# Patient Record
Sex: Female | Born: 1975 | Race: Black or African American | Hispanic: No | Marital: Single | Smoking: Former smoker
Health system: Southern US, Community
[De-identification: ages and names within clinical notes are randomized; demographics above are authoritative.]

## PROBLEM LIST (undated history)

## (undated) DIAGNOSIS — M7989 Other specified soft tissue disorders: Secondary | ICD-10-CM

## (undated) DIAGNOSIS — R519 Headache, unspecified: Secondary | ICD-10-CM

## (undated) DIAGNOSIS — Z8719 Personal history of other diseases of the digestive system: Secondary | ICD-10-CM

## (undated) DIAGNOSIS — I10 Essential (primary) hypertension: Secondary | ICD-10-CM

## (undated) DIAGNOSIS — K219 Gastro-esophageal reflux disease without esophagitis: Secondary | ICD-10-CM

## (undated) DIAGNOSIS — E039 Hypothyroidism, unspecified: Secondary | ICD-10-CM

## (undated) DIAGNOSIS — M549 Dorsalgia, unspecified: Secondary | ICD-10-CM

## (undated) DIAGNOSIS — R0602 Shortness of breath: Secondary | ICD-10-CM

## (undated) DIAGNOSIS — M2550 Pain in unspecified joint: Secondary | ICD-10-CM

## (undated) DIAGNOSIS — K5900 Constipation, unspecified: Secondary | ICD-10-CM

## (undated) DIAGNOSIS — T7840XA Allergy, unspecified, initial encounter: Secondary | ICD-10-CM

## (undated) DIAGNOSIS — E119 Type 2 diabetes mellitus without complications: Secondary | ICD-10-CM

## (undated) DIAGNOSIS — F988 Other specified behavioral and emotional disorders with onset usually occurring in childhood and adolescence: Secondary | ICD-10-CM

## (undated) DIAGNOSIS — R7303 Prediabetes: Secondary | ICD-10-CM

## (undated) DIAGNOSIS — F909 Attention-deficit hyperactivity disorder, unspecified type: Secondary | ICD-10-CM

## (undated) DIAGNOSIS — R079 Chest pain, unspecified: Secondary | ICD-10-CM

## (undated) DIAGNOSIS — D219 Benign neoplasm of connective and other soft tissue, unspecified: Secondary | ICD-10-CM

## (undated) DIAGNOSIS — G8929 Other chronic pain: Secondary | ICD-10-CM

## (undated) HISTORY — PX: SHOULDER SURGERY: SHX246

## (undated) HISTORY — DX: Allergy, unspecified, initial encounter: T78.40XA

## (undated) HISTORY — DX: Essential (primary) hypertension: I10

## (undated) HISTORY — PX: TUBAL LIGATION: SHX77

## (undated) HISTORY — DX: Shortness of breath: R06.02

## (undated) HISTORY — PX: CARPAL TUNNEL RELEASE: SHX101

## (undated) HISTORY — DX: Constipation, unspecified: K59.00

## (undated) HISTORY — DX: Benign neoplasm of connective and other soft tissue, unspecified: D21.9

## (undated) HISTORY — PX: ABDOMINAL HYSTERECTOMY: SHX81

## (undated) HISTORY — DX: Headache, unspecified: R51.9

## (undated) HISTORY — DX: Other chronic pain: G89.29

## (undated) HISTORY — DX: Hypothyroidism, unspecified: E03.9

## (undated) HISTORY — DX: Chest pain, unspecified: R07.9

## (undated) HISTORY — DX: Attention-deficit hyperactivity disorder, unspecified type: F90.9

## (undated) HISTORY — DX: Other specified soft tissue disorders: M79.89

## (undated) HISTORY — DX: Type 2 diabetes mellitus without complications: E11.9

## (undated) HISTORY — DX: Other specified behavioral and emotional disorders with onset usually occurring in childhood and adolescence: F98.8

## (undated) HISTORY — DX: Pain in unspecified joint: M25.50

---

## 1998-01-24 ENCOUNTER — Emergency Department (HOSPITAL_COMMUNITY): Admission: EM | Admit: 1998-01-24 | Discharge: 1998-01-24 | Payer: Self-pay | Admitting: Emergency Medicine

## 1998-01-24 ENCOUNTER — Inpatient Hospital Stay (HOSPITAL_COMMUNITY): Admission: EM | Admit: 1998-01-24 | Discharge: 1998-01-27 | Payer: Self-pay | Admitting: Psychiatry

## 1998-04-20 ENCOUNTER — Other Ambulatory Visit: Admission: RE | Admit: 1998-04-20 | Discharge: 1998-04-20 | Payer: Self-pay | Admitting: Obstetrics

## 1998-04-30 ENCOUNTER — Inpatient Hospital Stay (HOSPITAL_COMMUNITY): Admission: AD | Admit: 1998-04-30 | Discharge: 1998-04-30 | Payer: Self-pay | Admitting: Obstetrics

## 1998-05-10 ENCOUNTER — Inpatient Hospital Stay (HOSPITAL_COMMUNITY): Admission: AD | Admit: 1998-05-10 | Discharge: 1998-05-10 | Payer: Self-pay | Admitting: Obstetrics

## 1998-05-21 ENCOUNTER — Inpatient Hospital Stay (HOSPITAL_COMMUNITY): Admission: AD | Admit: 1998-05-21 | Discharge: 1998-05-21 | Payer: Self-pay | Admitting: Obstetrics

## 1998-05-28 ENCOUNTER — Inpatient Hospital Stay (HOSPITAL_COMMUNITY): Admission: AD | Admit: 1998-05-28 | Discharge: 1998-05-30 | Payer: Self-pay | Admitting: Obstetrics

## 1998-10-05 ENCOUNTER — Other Ambulatory Visit: Admission: RE | Admit: 1998-10-05 | Discharge: 1998-10-05 | Payer: Self-pay | Admitting: Obstetrics

## 1999-11-24 ENCOUNTER — Emergency Department (HOSPITAL_COMMUNITY): Admission: EM | Admit: 1999-11-24 | Discharge: 1999-11-24 | Payer: Self-pay | Admitting: Emergency Medicine

## 2000-05-04 ENCOUNTER — Other Ambulatory Visit: Admission: RE | Admit: 2000-05-04 | Discharge: 2000-05-04 | Payer: Self-pay | Admitting: Obstetrics

## 2001-02-16 ENCOUNTER — Inpatient Hospital Stay (HOSPITAL_COMMUNITY): Admission: AD | Admit: 2001-02-16 | Discharge: 2001-02-19 | Payer: Self-pay | Admitting: Obstetrics

## 2001-02-17 ENCOUNTER — Encounter: Payer: Self-pay | Admitting: *Deleted

## 2001-05-07 ENCOUNTER — Encounter: Payer: Self-pay | Admitting: Emergency Medicine

## 2001-05-07 ENCOUNTER — Emergency Department (HOSPITAL_COMMUNITY): Admission: EM | Admit: 2001-05-07 | Discharge: 2001-05-07 | Payer: Self-pay | Admitting: Emergency Medicine

## 2003-03-11 ENCOUNTER — Other Ambulatory Visit: Admission: RE | Admit: 2003-03-11 | Discharge: 2003-03-11 | Payer: Self-pay | Admitting: Family Medicine

## 2003-09-04 ENCOUNTER — Encounter: Admission: RE | Admit: 2003-09-04 | Discharge: 2003-12-03 | Payer: Self-pay | Admitting: Family Medicine

## 2003-11-21 ENCOUNTER — Encounter: Admission: RE | Admit: 2003-11-21 | Discharge: 2004-02-19 | Payer: Self-pay | Admitting: Family Medicine

## 2004-03-27 ENCOUNTER — Emergency Department (HOSPITAL_COMMUNITY): Admission: EM | Admit: 2004-03-27 | Discharge: 2004-03-28 | Payer: Self-pay | Admitting: Emergency Medicine

## 2004-05-28 ENCOUNTER — Emergency Department (HOSPITAL_COMMUNITY): Admission: EM | Admit: 2004-05-28 | Discharge: 2004-05-28 | Payer: Self-pay | Admitting: Emergency Medicine

## 2004-07-08 ENCOUNTER — Other Ambulatory Visit: Admission: RE | Admit: 2004-07-08 | Discharge: 2004-07-08 | Payer: Self-pay | Admitting: Family Medicine

## 2005-12-13 ENCOUNTER — Encounter: Admission: RE | Admit: 2005-12-13 | Discharge: 2006-03-13 | Payer: Self-pay | Admitting: Family Medicine

## 2005-12-29 ENCOUNTER — Encounter: Admission: RE | Admit: 2005-12-29 | Discharge: 2005-12-29 | Payer: Self-pay | Admitting: Cardiology

## 2006-02-08 ENCOUNTER — Encounter: Admission: RE | Admit: 2006-02-08 | Discharge: 2006-02-08 | Payer: Self-pay | Admitting: Cardiology

## 2006-02-11 ENCOUNTER — Encounter: Admission: RE | Admit: 2006-02-11 | Discharge: 2006-02-11 | Payer: Self-pay | Admitting: Cardiology

## 2006-03-03 ENCOUNTER — Ambulatory Visit (HOSPITAL_BASED_OUTPATIENT_CLINIC_OR_DEPARTMENT_OTHER): Admission: RE | Admit: 2006-03-03 | Discharge: 2006-03-03 | Payer: Self-pay | Admitting: Cardiology

## 2006-03-05 ENCOUNTER — Ambulatory Visit: Payer: Self-pay | Admitting: Internal Medicine

## 2006-03-06 ENCOUNTER — Ambulatory Visit (HOSPITAL_COMMUNITY): Admission: RE | Admit: 2006-03-06 | Discharge: 2006-03-06 | Payer: Self-pay | Admitting: Cardiology

## 2006-03-06 ENCOUNTER — Encounter: Payer: Self-pay | Admitting: Vascular Surgery

## 2006-03-08 ENCOUNTER — Ambulatory Visit (HOSPITAL_BASED_OUTPATIENT_CLINIC_OR_DEPARTMENT_OTHER): Admission: RE | Admit: 2006-03-08 | Discharge: 2006-03-08 | Payer: Self-pay | Admitting: Orthopedic Surgery

## 2006-03-23 ENCOUNTER — Encounter: Admission: RE | Admit: 2006-03-23 | Discharge: 2006-04-14 | Payer: Self-pay | Admitting: Orthopedic Surgery

## 2006-05-05 ENCOUNTER — Emergency Department (HOSPITAL_COMMUNITY): Admission: EM | Admit: 2006-05-05 | Discharge: 2006-05-05 | Payer: Self-pay | Admitting: Family Medicine

## 2006-08-23 ENCOUNTER — Emergency Department (HOSPITAL_COMMUNITY): Admission: EM | Admit: 2006-08-23 | Discharge: 2006-08-23 | Payer: Self-pay | Admitting: Emergency Medicine

## 2007-01-23 ENCOUNTER — Encounter: Admission: RE | Admit: 2007-01-23 | Discharge: 2007-01-23 | Payer: Self-pay | Admitting: Cardiology

## 2007-01-24 ENCOUNTER — Encounter (HOSPITAL_COMMUNITY): Admission: RE | Admit: 2007-01-24 | Discharge: 2007-04-17 | Payer: Self-pay | Admitting: Cardiology

## 2007-01-28 ENCOUNTER — Encounter: Admission: RE | Admit: 2007-01-28 | Discharge: 2007-01-28 | Payer: Self-pay | Admitting: Cardiology

## 2007-05-13 ENCOUNTER — Emergency Department (HOSPITAL_COMMUNITY): Admission: EM | Admit: 2007-05-13 | Discharge: 2007-05-13 | Payer: Self-pay | Admitting: Family Medicine

## 2009-03-30 ENCOUNTER — Encounter (INDEPENDENT_AMBULATORY_CARE_PROVIDER_SITE_OTHER): Payer: Self-pay | Admitting: *Deleted

## 2009-03-30 DIAGNOSIS — F3289 Other specified depressive episodes: Secondary | ICD-10-CM | POA: Insufficient documentation

## 2009-03-30 DIAGNOSIS — F329 Major depressive disorder, single episode, unspecified: Secondary | ICD-10-CM

## 2009-04-16 ENCOUNTER — Ambulatory Visit: Payer: Self-pay | Admitting: Family Medicine

## 2009-04-16 DIAGNOSIS — E669 Obesity, unspecified: Secondary | ICD-10-CM | POA: Insufficient documentation

## 2009-04-16 DIAGNOSIS — E039 Hypothyroidism, unspecified: Secondary | ICD-10-CM

## 2009-04-16 HISTORY — DX: Obesity, unspecified: E66.9

## 2009-04-16 HISTORY — DX: Hypothyroidism, unspecified: E03.9

## 2009-04-21 ENCOUNTER — Ambulatory Visit: Payer: Self-pay | Admitting: Family Medicine

## 2009-04-21 ENCOUNTER — Encounter: Payer: Self-pay | Admitting: Sports Medicine

## 2009-04-21 LAB — CONVERTED CEMR LAB
ALT: 16 U/L (ref 0–35)
AST: 15 units/L (ref 0–37)
Albumin: 3.9 g/dL (ref 3.5–5.2)
Alkaline Phosphatase: 72 units/L (ref 39–117)
BUN: 9 mg/dL (ref 6–23)
CO2: 27 meq/L (ref 19–32)
Calcium: 9.1 mg/dL (ref 8.4–10.5)
Chloride: 105 meq/L (ref 96–112)
Cholesterol: 169 mg/dL (ref 0–200)
Creatinine, Ser: 0.92 mg/dL (ref 0.40–1.20)
Free T4: 1.04 ng/dL (ref 0.80–1.80)
Glucose, Bld: 106 mg/dL — ABNORMAL HIGH (ref 70–99)
HCT: 41 % (ref 36.0–46.0)
HDL: 56 mg/dL (ref >39)
Hemoglobin: 12.9 g/dL (ref 12.0–15.0)
LDL Cholesterol: 101 mg/dL — ABNORMAL HIGH (ref 0–99)
MCHC: 31.5 g/dL (ref 30.0–36.0)
MCV: 83.3 fL (ref 78.0–100.0)
Platelets: 258 10*3/uL (ref 150–400)
Potassium: 4.2 meq/L (ref 3.5–5.3)
RBC: 4.92 M/uL (ref 3.87–5.11)
RDW: 15.1 % (ref 11.5–15.5)
Sodium: 140 meq/L (ref 135–145)
TSH: 2.287 microintl units/mL (ref 0.350–4.500)
Total Bilirubin: 0.4 mg/dL (ref 0.3–1.2)
Total CHOL/HDL Ratio: 3
Total Protein: 7 g/dL (ref 6.0–8.3)
Triglycerides: 58 mg/dL (ref <150)
VLDL: 12 mg/dL (ref 0–40)
WBC: 7.3 10*3/microliter (ref 4.0–10.5)

## 2009-04-24 ENCOUNTER — Ambulatory Visit: Payer: Self-pay | Admitting: Family Medicine

## 2009-04-24 ENCOUNTER — Encounter: Payer: Self-pay | Admitting: Sports Medicine

## 2009-04-24 LAB — CONVERTED CEMR LAB
Chlamydia, DNA Probe: NEGATIVE
GC Probe Amp, Genital: NEGATIVE
Whiff Test: NEGATIVE

## 2009-04-28 ENCOUNTER — Telehealth: Payer: Self-pay | Admitting: Sports Medicine

## 2009-07-21 ENCOUNTER — Telehealth: Payer: Self-pay | Admitting: Sports Medicine

## 2009-08-03 ENCOUNTER — Ambulatory Visit: Payer: Self-pay | Admitting: Family Medicine

## 2009-08-03 LAB — CONVERTED CEMR LAB: Beta hcg, urine, semiquantitative: NEGATIVE

## 2009-11-23 ENCOUNTER — Telehealth: Payer: Self-pay | Admitting: *Deleted

## 2009-11-23 ENCOUNTER — Emergency Department (HOSPITAL_COMMUNITY): Admission: EM | Admit: 2009-11-23 | Discharge: 2009-11-23 | Payer: Self-pay | Admitting: Family Medicine

## 2010-04-07 ENCOUNTER — Telehealth: Payer: Self-pay | Admitting: Sports Medicine

## 2010-06-11 ENCOUNTER — Ambulatory Visit: Payer: Self-pay | Admitting: Family Medicine

## 2010-06-11 ENCOUNTER — Encounter: Payer: Self-pay | Admitting: Sports Medicine

## 2010-06-11 DIAGNOSIS — K219 Gastro-esophageal reflux disease without esophagitis: Secondary | ICD-10-CM | POA: Insufficient documentation

## 2010-06-11 HISTORY — DX: Gastro-esophageal reflux disease without esophagitis: K21.9

## 2010-06-11 LAB — CONVERTED CEMR LAB
BUN: 12 mg/dL (ref 6–23)
CO2: 25 meq/L (ref 19–32)
Calcium: 9.4 mg/dL (ref 8.4–10.5)
Chloride: 105 meq/L (ref 96–112)
Creatinine, Ser: 0.88 mg/dL (ref 0.40–1.20)
Glucose, Bld: 97 mg/dL (ref 70–99)
Pap Smear: NEGATIVE
Potassium: 4.9 meq/L (ref 3.5–5.3)
Sodium: 142 meq/L (ref 135–145)
TSH: 2.265 microintl units/mL (ref 0.350–4.500)
Whiff Test: POSITIVE

## 2010-06-12 ENCOUNTER — Encounter: Payer: Self-pay | Admitting: Sports Medicine

## 2010-06-18 ENCOUNTER — Emergency Department (HOSPITAL_COMMUNITY): Admission: EM | Admit: 2010-06-18 | Discharge: 2010-06-18 | Payer: Self-pay | Admitting: Family Medicine

## 2010-07-14 ENCOUNTER — Emergency Department (HOSPITAL_COMMUNITY): Admission: EM | Admit: 2010-07-14 | Discharge: 2010-07-15 | Payer: Self-pay | Admitting: Emergency Medicine

## 2010-07-15 ENCOUNTER — Telehealth: Payer: Self-pay | Admitting: Sports Medicine

## 2010-07-16 ENCOUNTER — Emergency Department (HOSPITAL_COMMUNITY): Admission: EM | Admit: 2010-07-16 | Discharge: 2010-07-16 | Payer: Self-pay | Admitting: Family Medicine

## 2010-07-23 ENCOUNTER — Ambulatory Visit: Payer: Self-pay | Admitting: Family Medicine

## 2010-07-23 ENCOUNTER — Encounter: Payer: Self-pay | Admitting: Sports Medicine

## 2010-07-23 DIAGNOSIS — R609 Edema, unspecified: Secondary | ICD-10-CM

## 2010-07-23 DIAGNOSIS — M2240 Chondromalacia patellae, unspecified knee: Secondary | ICD-10-CM | POA: Insufficient documentation

## 2010-07-23 HISTORY — DX: Edema, unspecified: R60.9

## 2010-07-23 LAB — CONVERTED CEMR LAB
ALT: 20 U/L (ref 0–35)
AST: 16 units/L (ref 0–37)
Albumin: 4.2 g/dL (ref 3.5–5.2)
Alkaline Phosphatase: 62 U/L (ref 39–117)
BUN: 11 mg/dL (ref 6–23)
Bilirubin Urine: NEGATIVE
Blood in Urine, dipstick: NEGATIVE
CO2: 30 meq/L (ref 19–32)
Calcium: 9.4 mg/dL (ref 8.4–10.5)
Chloride: 99 meq/L (ref 96–112)
Creatinine, Ser: 0.95 mg/dL (ref 0.40–1.20)
Glucose, Bld: 125 mg/dL — ABNORMAL HIGH (ref 70–99)
Glucose, Urine, Semiquant: NEGATIVE
HCT: 40.7 % (ref 36.0–46.0)
Hemoglobin: 13.2 g/dL (ref 12.0–15.0)
Ketones, urine, test strip: NEGATIVE
MCHC: 32.4 g/dL (ref 30.0–36.0)
MCV: 81.1 fL (ref 78.0–100.0)
Nitrite: NEGATIVE
Platelets: 263 10*3/uL (ref 150–400)
Potassium: 4.3 meq/L (ref 3.5–5.3)
Pro B Natriuretic peptide (BNP): 3 pg/mL (ref 0.0–100.0)
Protein, U semiquant: NEGATIVE
RBC: 5.02 M/uL (ref 3.87–5.11)
RDW: 15.5 % (ref 11.5–15.5)
Sodium: 139 meq/L (ref 135–145)
Specific Gravity, Urine: 1.01
Total Bilirubin: 0.4 mg/dL (ref 0.3–1.2)
Total Protein: 7.7 g/dL (ref 6.0–8.3)
Urobilinogen, UA: 0.2
WBC Urine, dipstick: NEGATIVE
WBC: 8.9 10*3/uL (ref 4.0–10.5)
pH: 6

## 2010-07-29 ENCOUNTER — Telehealth: Payer: Self-pay | Admitting: Sports Medicine

## 2010-07-30 ENCOUNTER — Telehealth: Payer: Self-pay | Admitting: *Deleted

## 2010-08-09 ENCOUNTER — Encounter: Payer: Self-pay | Admitting: Sports Medicine

## 2010-08-09 ENCOUNTER — Ambulatory Visit (HOSPITAL_COMMUNITY): Admission: RE | Admit: 2010-08-09 | Discharge: 2010-08-09 | Payer: Self-pay | Admitting: Sports Medicine

## 2010-08-10 ENCOUNTER — Telehealth: Payer: Self-pay | Admitting: Sports Medicine

## 2010-08-11 ENCOUNTER — Ambulatory Visit: Payer: Self-pay | Admitting: Vascular Surgery

## 2010-08-16 ENCOUNTER — Encounter: Payer: Self-pay | Admitting: Sports Medicine

## 2010-08-16 ENCOUNTER — Ambulatory Visit: Payer: Self-pay | Admitting: Family Medicine

## 2010-08-16 DIAGNOSIS — I10 Essential (primary) hypertension: Secondary | ICD-10-CM | POA: Insufficient documentation

## 2010-08-16 LAB — CONVERTED CEMR LAB
BUN: 16 mg/dL (ref 6–23)
CO2: 29 meq/L (ref 19–32)
Calcium: 9.5 mg/dL (ref 8.4–10.5)
Chloride: 104 meq/L (ref 96–112)
Cortisol, Plasma: 3.1 ug/dL
Creatinine, Ser: 0.98 mg/dL (ref 0.40–1.20)
Glucose, Bld: 100 mg/dL — ABNORMAL HIGH (ref 70–99)
Potassium: 4.3 meq/L (ref 3.5–5.3)
Sodium: 145 meq/L (ref 135–145)

## 2010-08-19 ENCOUNTER — Telehealth: Payer: Self-pay | Admitting: *Deleted

## 2010-08-30 ENCOUNTER — Encounter: Payer: Self-pay | Admitting: Sports Medicine

## 2010-08-31 ENCOUNTER — Encounter: Payer: Self-pay | Admitting: Sports Medicine

## 2010-08-31 ENCOUNTER — Telehealth: Payer: Self-pay | Admitting: *Deleted

## 2010-08-31 LAB — CONVERTED CEMR LAB
Metaneph Total, Ur: 673 ug/(24.h) (ref 115–695)
Metanephrines, Ur: 214 — ABNORMAL HIGH (ref 36–190)
Normetanephrine, 24H Ur: 459 (ref 35–482)
Volume, Urine-CORTUR: 3700 mL

## 2010-09-06 ENCOUNTER — Telehealth: Payer: Self-pay | Admitting: *Deleted

## 2010-09-08 ENCOUNTER — Ambulatory Visit: Payer: Self-pay | Admitting: Family Medicine

## 2010-09-08 ENCOUNTER — Encounter: Payer: Self-pay | Admitting: Sports Medicine

## 2010-11-09 ENCOUNTER — Ambulatory Visit
Admission: RE | Admit: 2010-11-09 | Discharge: 2010-11-09 | Payer: Self-pay | Source: Home / Self Care | Attending: Family Medicine | Admitting: Family Medicine

## 2010-11-19 ENCOUNTER — Encounter: Payer: Self-pay | Admitting: Sports Medicine

## 2010-11-25 NOTE — Assessment & Plan Note (Signed)
Summary: bp f/u bmc   Vital Signs:  Patient profile:   35 year old female Weight:      386 pounds Temp:     99.5 degrees F oral Pulse rate:   102 / minute Pulse rhythm:   regular BP sitting:   138 / 93  (left arm) Cuff size:   large  Vitals Entered By: Loralee Pacas CMA (August 16, 2010 4:16 PM) CC: follow-up visit   Primary Care Provider:  Rodney Langton MD  CC:  follow-up visit.  History of Present Illness: 35 yo female, swelling LE here for fu  Swelling:  Prior hx: Associated with SOB, no worse at night, limits activity.  Went to ED and UCC, ISTAT chem normal, UA normal.  given lasix which helped a lot.  Swelling is below knees, painful/dull ache.  No injury.  She is finally wearing TED hose which helps a lot.  No fevers/chills, no N/V/D/C.  No CP.  Has gained 50 lbs since earlier this year.  Currently on lasix 80 once daily without ADEs, feels this helps her edema/pain significantly.  Elevated BP:  Notes periods of headaches, palpitations, sweating, and elevated BP (has been up to the 170's systolic)  No associated CP or SOB with these episodes.  Nl TSH.  Nl renal function, Nl lytes.  Habits & Providers  Alcohol-Tobacco-Diet     Tobacco Status: never     Tobacco Counseling: to remain off tobacco products  Current Medications (verified): 1)  Citalopram Hydrobromide 40 Mg Tabs (Citalopram Hydrobromide) .... One Tab By Mouth Daily 2)  Nexium 40 Mg Cpdr (Esomeprazole Magnesium) .... One Tab By Mouth Qhs 3)  Furosemide 40 Mg Tabs (Furosemide) .... One Tab By Mouth Bid 4)  Naproxen 500 Mg Tabs (Naproxen) .... One Tab By Mouth Two Times A Day 5)  Klor-Con M20 20 Meq Cr-Tabs (Potassium Chloride Crys Cr) .... One Tab By Mouth Daily 6)  Knee High Compression Hose .... Extra Large.  Wear Daily  Allergies (verified): No Known Drug Allergies  Past History:  Past Medical History: Depression Hypertension history of migraines Obesity Hypothyroidism PHQ-9: 04/24/09: 16  (Moderately Severe Depression, started Celexa 20) Bilateral Lower Ext edema (neg ECHO, BNP, Chemistries, TSH, U/A, LE dopplers)  Review of Systems       See HPI  Physical Exam  General:  Well-developed,well-nourished,in no acute distress; alert,appropriate and cooperative throughout examination Lungs:  Normal respiratory effort, chest expands symmetrically. Lungs are clear to auscultation, no crackles or wheezes. Heart:  Normal rate and regular rhythm. S1 and S2 normal without gallop, murmur, click, rub or other extra sounds. Extremities:  No clubbing, cyanosis, or deformity noted with normal full range of motion of all joints.  She does have 2+ pitting edema unchanged from prior visit.   Impression & Recommendations:  Problem # 1:  LEG EDEMA, BILATERAL (ICD-782.3) Assessment Improved Prior workup negative as described in HPI, her edema may be due to chronic venous insufficiency and her obesity.   I will check a BMET today and increase her lasix to 160mg  daily to see if this helps.   She is to RTC 1-2 weeks to recheck. Cont KCl.  Her updated medication list for this problem includes:    Furosemide 40 Mg Tabs (Furosemide) .Marland Kitchen..Marland Kitchen Two tabs by mouth two times a day  Orders: [facility]- Est  Level 4 ([address]) Basic Met-[facility] ([id number]) Miscellaneous Lab Charge-[facility] (99999)Future Orders: Miscellaneous Lab Charge-[facility] ([id number]) ... 08/17/2011  Problem # 2:  ELEVATED BLOOD PRESSURE WITHOUT  DIAGNOSIS OF HYPERTENSION (ICD-796.2) Assessment: New Likely essential HTN however with history would like to exclude secondary causes of HTN such as pheochromocytoma with HA, sweating, palpitations and a normal TSH and no CP.  Would also like to exclude cushings syndrome/disease as she is obese and had hyperglycemia on her last BMET. Renal artery stenosis unlikely with normal Na/K. 24h urinary VMA/HVA 24h urinary Cortisol BMET, random serum cortisol. RTC 1-2 wks to go over labs.  Her updated medication list  for this problem includes:    Furosemide 40 Mg Tabs (Furosemide) .Marland Kitchen..Marland Kitchen Two tabs by mouth two times a day  Orders: [facility]- Est  Level 4 (30865) Basic Met-[facility] ([address]8469-62952) Miscellaneous Lab Charge-[facility] (99999)Future Orders: Miscellaneous Lab Charge-[facility] (84132) ... 08/17/2011  Complete Medication List: 1)  Citalopram Hydrobromide 40 Mg Tabs (Citalopram hydrobromide) .... One tab by mouth daily 2)  Nexium 40 Mg Cpdr (Esomeprazole magnesium) .... One tab by mouth qhs 3)  Furosemide 40 Mg Tabs (Furosemide) .... Two tabs by mouth two times a day 4)  Klor-con M20 20 Meq Cr-tabs (Potassium chloride crys cr) .... One tab by mouth daily 5)  Knee High Compression Hose  .... Extra large.  wear daily   Orders Added: 1)  Miscellaneous Lab Charge-[facility] [99999] 2)  [facility]- Est  Level 4 [99214] 3)  Basic Met-[facility] [44010-27253] 4)  Miscellaneous Lab Charge-[facility] [66440]

## 2010-11-25 NOTE — Progress Notes (Signed)
Summary: results  Phone Note Call from Patient Call back at Home Phone [phone]   Caller: Patient Summary of Call: is wanting results of urine test Initial call taken by: De Nurse,  September 06, 2010 10:52 AM  Follow-up for Phone Call        Metanephrines, VMA, HVA unremarkable, still waiting on urinary cortisol, will let her know when all results back. Follow-up by: Rodney Langton MD,  September 06, 2010 11:11 AM  Additional Follow-up for Phone Call Additional follow up Details #1::        spoke with patient and informed her of results. Told her that when other urine test came back we will call her and let her know results Additional Follow-up by: Jimmy Footman, CMA,  September 06, 2010 4:12 PM

## 2010-11-25 NOTE — Progress Notes (Signed)
Summary: phn msg  Phone Note Call from Patient Call back at [facility] Phone [phone]   Caller: Patient Summary of Call: Wants to know if she can get something for fluid on her feet,ankle and legs.  Also asking for something for pain for this.  Says her mobility is limited. Initial call taken by: Clydell Hakim,  July 15, 2010 4:48 PM  Follow-up for Phone Call        Would need to see her for this complaint. Follow-up by: Rodney Langton MD,  July 16, 2010 9:51 AM  Additional Follow-up for Phone Call Additional follow up Details #1::        states she went to ED & chest & kidneys are fine. did not give her any meds to take. states her feet & legs are "beyond recognition" and hurts severely. told her she must be seen & have this helped as we are going into the weekend. advised going back to ED & insisting the do something. she agreed. asked that she call monday & make f/u appt Additional Follow-up by: Golden Circle RN,  July 16, 2010 4:02 PM    Additional Follow-up for Phone Call Additional follow up Details #2::    LM Follow-up by: Golden Circle RN,  July 19, 2010 10:36 AM  Additional Follow-up for Phone Call Additional follow up Details #3:: Details for Additional Follow-up Action Taken: she did go to the ED. got lasix & took off water & pain. she wants to know why this is happening "to my body". she did not have a positive experience there. appt made with pcp fri per her request  Additional Follow-up by: Golden Circle RN,  July 20, 2010 10:19 AM

## 2010-11-25 NOTE — Letter (Signed)
Summary: Handout Printed  Printed Handout:  - Stomatitis

## 2010-11-25 NOTE — Letter (Signed)
Summary: Results Follow-up Letter  [facility]  [address]   [place], [place] [address]   Phone: [phone]  Fax: [phone]    06/12/2010  [address] [place], [place]  [address]  Dear Ms. Schamp,   The following are the results of your recent test(s):  Basic metabolic panel is normal. TSH (thyroid function) is normal. Come back to see me as previously discussed.   ________________________  Sincerely,  Rodney Langton MD [facility]           Appended Document: Results Follow-up Letter mailed

## 2010-11-25 NOTE — Letter (Signed)
Summary: Handout Printed  Printed Handout:  - Diet - Calorie Counting 

## 2010-11-25 NOTE — Assessment & Plan Note (Signed)
 Summary: NP,df   Vital Signs:  Patient profile:   35 year old female Weight:      337.4 pounds Temp:     99.8 degrees F oral Pulse rate:   120 / minute BP sitting:   150 / 79  (left arm)  Vitals Entered By: Letitia Reusing (April 16, 2009 2:18 PM) CC: NP Is Patient Diabetic? No   Primary Care Provider:  Debby Petties MD  CC:  NP.  History of Present Illness: 78F with Obesity, HTN, Hypothyroid comes in for new PT eval.  Obesity:  Would like help losing weight.  HTN:  Elevated BP today, had taken Metoprolol in the past.  Stopped taking it when she lost insurance.  Hypothyroid:  Used Synthroid in the past, stopped taking it when she lost insurance.  Unsure when last TFTs checked.  Menstrual-related mood disturbance:  Predominant with menstrual periods, very labile mood, cries, interferes with daily life and activities.  Was previously on Yaz for PMDD.  Habits & Providers  Alcohol-Tobacco-Diet     Tobacco Status: never  Past History:  Past Medical History: Depression Hypertension history of migraines Obesity Hypothyroidism  Past Surgical History: Tubal ligation 1999 Dr. Aida Na Carpal tunnel release 1996 Dr. Charlott Shoulder Arthroscopy and foreign body removal 2007 Dr. Elspeth Aspen [facility].  Family History: Diabetes 1st degree relative- father Hypertension ETOH abuse Colon Ca Grandmother had MI in her 28s  Social History: Pt lives with 2 sons born in 64 and 1999 and daughter born in 57.  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 She quit smoking cigarettes in 2009, drinks wine approximately 2 times per year, uses no illicit drugs.  Does not exercise.  Has a Dog.Smoking Status:  never  Review of Systems       See HPI  Physical Exam  General:  Well-developed,well-nourished,in no acute distress; alert,appropriate and cooperative throughout examination Lungs:   Normal respiratory effort, chest expands symmetrically. Lungs are clear to auscultation, no crackles or wheezes. Heart:  Normal rate and regular rhythm. S1 and S2 normal without gallop, murmur, click, rub or other extra sounds. Abdomen:  Bowel sounds positive,abdomen soft and non-tender without masses, organomegaly or hernias noted.   Impression & Recommendations:  Problem # 1:  OBESITY (ICD-278.00) Assessment New Will work with pt at subsequent visits on calorie counting and proper eating as well as exercise.  Will also refer to nutritionist.  Problem # 2:  HYPOTHYROIDISM (ICD-244.9) Assessment: New Pt not taking any meds and unsure as to last TSH.  Will have pt come in tomo morning for TSH and FT4 levels.  Will dose Synthroid accordingly.  Problem # 3:  HYPERTENSION (ICD-401.9) Assessment: New BP elevated today.  Will check one more BP reading and if elevated, will restart her metoprolol.  Future Orders: Comp Met-[facility] [id number]) ... 04/15/2010 TSH-[facility] [id number]) ... 04/15/2010 Free T4-[facility] [id number]) ... 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    Problem # 5:  FATIGUE (ICD-780.79) Assessment: New Vague complaint, could be due to menorrhagia and anemia, hypothyroidism, depression.  Will check the following labs and treat accordingly.  Will further screen for depression with PHQ-9 at next visit however I think her depressive symptoms may be 2/2 PMDD as they cycle with menstruation.  Future Orders: Comp Met-[facility] [id number]) ... 04/15/2010  Lipid-[facility] [id number]) ... 04/15/2010 CBC-[facility] ([id number]) ... 04/15/2010 TSH-[facility] [id number]) ... 04/15/2010 Free T4-[facility] [id number]) ... 04/15/2010  Patient Instructions: 1)  Great to meet you today, 2)  Please make a lab appt for tomorrow morning at 8:30.  Do not eat  after midnight the night prior.  I will check CBC, CMET, Lipids, TSH, T4. 3)  Come back to see me next friday at 3:30pm, I have an opening. 4)  -Dr. ONEIDA.

## 2010-11-25 NOTE — Assessment & Plan Note (Signed)
 Summary: f/up,tcb   Vital Signs:  Patient profile:   35 year old female Weight:      341.5 pounds Pulse rate:   92 / minute BP sitting:   120 / 80  (left arm)  Vitals Entered By: Jack Bloodgood CMA, (August 03, 2009 9:31 AM) CC: f/up celexa. LMP 06-21-09. had BTL 1999. usually does have regular periods. Is Patient Diabetic? No Pain Assessment Patient in pain? no        Primary Care Provider:  Debby Petties MD  CC:  f/up celexa. LMP 06-21-09. had BTL 1999. usually does have regular periods..  History of Present Illness: 33F here for c/o amenorrhea.  Hx BTL, sexually active, LMP 06/21/09, 29d, last 6 d.  2 weeks late.  c/o breast fullness.  No abd pain, no fevers/chills, no discharge.  Only new medication is celexa.  Had a discharge at last visit, negative WP, this has resolved.  Obesity:  has missed one and cancelled the other appt with Dr. Wonda.    Tooth pain: Referral made at last visit, has not gone, given phone number to call today.    Habits & Providers  Alcohol-Tobacco-Diet     Tobacco Status: quit     Tobacco Counseling: to remain off tobacco products     Gynecologist: Dr. Layman  Social History: Smoking Status:  quit  Review of Systems       See HPI  Physical Exam  General:  Well-developed,well-nourished,in no acute distress; alert,appropriate and cooperative throughout examination Lungs:  Normal respiratory effort, chest expands symmetrically. Lungs are clear to auscultation, no crackles or wheezes. Heart:  Normal rate and regular rhythm. S1 and S2 normal without gallop, murmur, click, rub or other extra sounds. Abdomen:  Bowel sounds positive,abdomen soft and non-tender without masses, organomegaly or hernias noted. Additional Exam:  PHQ-9: score: 6.  Mild depression.   Impression & Recommendations:  Problem # 1:  AMENORRHEA (ICD-626.0) Assessment Deteriorated Likely anovulatory bleeding.  Upreg neg and unlikely with Hx BTL.  Will have Pt  RTC 2 wk for repeat UPREG, if still no menstruation then consider inducing bleeding with provera and will check fertility labs at that time (TSH, FH, LH, estradiol, androgens).  Pt asking about getting pregnant again, advised return to OBGYN, Dr. Layman to discuss possibilities after years of having had tubes clamped.  Orders: U Preg-[facility] ([id number]) [facility]- Est  Level 4 (00785)  Future Orders: U Preg-[facility] ([id number]) ... 08/03/2010  Problem # 2:  DENTAL PAIN (ICD-525.9) Assessment: Unchanged Phone number given to pt to make appt with dentist, referral already made.  Orders: [facility]- Est  Level 4 (00785)  Problem # 3:  LEUKORRHEA (ICD-623.5) Assessment: Improved Resolved, was likely physiologic discharge.  Orders: [facility]- Est  Level 4 (00785)  Problem # 4:  OBESITY (ICD-278.00) Assessment: Deteriorated Has gained weight.  Pt to call Dr. Wonda as she has missed/cancelled her last 2 appts.  Orders: [facility]- Est  Level 4 (00785)  Problem # 5:  DEPRESSION (ICD-311) Assessment: Improved Improved PHQ-9 score of 6 today, mild depression.  Still with some mild symptoms so will increase celexa, pt to keep vigilant of symptoms of serotonin syndrome.  Will do another PHQ-9 at next visit.  Her updated medication list for this problem includes:    Citalopram Hydrobromide 40 Mg Tabs (Citalopram hydrobromide) ..... One tab by mouth daily  Orders: [facility]- Est  Level 4 (00785)  Complete Medication List: 1)  Citalopram Hydrobromide 40 Mg Tabs (Citalopram hydrobromide) .... One tab  by mouth daily  Patient Instructions: 1)  Great to see you today, 2)  I will refill your celexa. 3)  You probably have had an anovulatory cycle and this would explain not bleeding, if still no menstruation in 2 weeks then come back for a repeat pregnancy test, you do not have to see me on this date.  I will actually not induce bleeding until another month and a half. 4)  Be sure to call Dr. Wonda for another appt. 5)  Call the  dentist. 6)  Talk to your OBGYN Dr. Layman regarding your tubal and the likelihood of getting pregnant. 7)  Come back to see me as needed. 8)  -Dr. ONEIDA. Prescriptions: CITALOPRAM HYDROBROMIDE 40 MG TABS (CITALOPRAM HYDROBROMIDE) One tab by mouth daily  #90 x 6   Entered and Authorized by:   Debby Petties MD   Signed by:   Debby Petties MD on 08/03/2009   Method used:   Electronically to        [facility] [phone]* (retail)       [address]       [place], [place]  [address]       Ph: [phone]       Fax: [phone]   RxID:   [id number]   Laboratory Results   Urine Tests  Date/Time Received: August 03, 2009 9:49 AM  Date/Time Reported: August 03, 2009 9:53 AM     Urine HCG: negative Comments: ...............test performed by......SABRABonnie A. Jordan, [place] (ASCP)

## 2010-11-25 NOTE — Assessment & Plan Note (Signed)
Summary: cpe,df   Vital Signs:  Patient profile:   35 year old female Height:      69 inches Weight:      379.8 pounds BMI:     56.29 Temp:     98.8 degrees F oral Pulse rate:   91 / minute BP sitting:   128 / 82  (left arm) Cuff size:   large  Vitals Entered By: Garen Grams LPN (June 11, 2010 11:18 AM) CC: cpe Is Patient Diabetic? No Pain Assessment Patient in pain? yes     Location: lower back Intensity: 8   Primary Care Jae Skeet:  Rodney Langton MD  CC:  cpe.  History of Present Illness: 24F here for CPE and PAP.  Is 8d late for period.  Not sexually active.  Not on any teratogenic meds.  No bleeding, no pain.    Rash on leg:  Present a couple months, getting better, near hair follicles.  Vaginal odor:  No DC, no itch, no pain, no bleeding.  No dysuria/frequency.    Habits & Providers  Alcohol-Tobacco-Diet     Tobacco Status: never  Current Medications (verified): 1)  Citalopram Hydrobromide 40 Mg Tabs (Citalopram Hydrobromide) .... One Tab By Mouth Daily 2)  Nexium 40 Mg Cpdr (Esomeprazole Magnesium) .... One Tab By Mouth Qhs  Allergies (verified): No Known Drug Allergies  Social History: Smoking Status:  never  Physical Exam  General:  Well-developed,well-nourished,in no acute distress; alert,appropriate and cooperative throughout examination Head:  Normocephalic and atraumatic without obvious abnormalities. No apparent alopecia or balding. Eyes:  No corneal or conjunctival inflammation noted. EOMI. Perrla.  Ears:  External ear exam shows no significant lesions or deformities.  Otoscopic examination reveals clear canals, tympanic membranes are intact bilaterally without bulging, retraction, inflammation or discharge. Hearing is grossly normal bilaterally. Nose:  External nasal examination shows no deformity or inflammation. Nasal mucosa are pink and moist without lesions or exudates. Mouth:  Oral mucosa and oropharynx without lesions or  exudates.  Teeth in good repair. Neck:  No deformities, masses, or tenderness noted. Chest Wall:  No deformities, masses, or tenderness noted. Lungs:  Normal respiratory effort, chest expands symmetrically. Lungs are clear to auscultation, no crackles or wheezes. Heart:  Normal rate and regular rhythm. S1 and S2 normal without gallop, murmur, click, rub or other extra sounds. Abdomen:  Bowel sounds positive,abdomen soft and non-tender without masses, organomegaly or hernias noted. Genitalia:  Normal introitus for age, no external lesions, no vaginal discharge, mucosa pink and moist, no vaginal or cervical lesions, no vaginal atrophy, no friaility or hemorrhage, normal uterus size and position, no adnexal masses or tenderness Msk:  No deformity or scoliosis noted of thoracic or lumbar spine.   Pulses:  R and L carotid,radial,femoral,dorsalis pedis and posterior tibial pulses are full and equal bilaterally Extremities:  No clubbing, cyanosis, or deformity noted with normal full range of motion of all joints.  She does have 2+ pitting edema. Neurologic:  No cranial nerve deficits noted. Station and gait are normal. Plantar reflexes are down-going bilaterally. DTRs are symmetrical throughout. Sensory, motor and coordinative functions appear intact. Skin:  Area of what appears to be post-inflammatory hyperpigmentation near an ingrown hair on her left shin.  No erythema or drainage.   Impression & Recommendations:  Problem # 1:  SCREENING FOR MALIGNANT NEOPLASM OF THE CERVIX (ICD-V76.2) Assessment New  PAP done.  Orders: Pap Smear-[facility] ([id number]) [facility] - Est  18-39 yrs (09811)  Problem # 2:  AMENORRHEA (ICD-626.0)  Assessment: Unchanged 8 d past her period.  Her cycles are somewhat irregular, this happened last year as well.  She can call back if doesn't get her period within a week or 2.  has vaginal odor, checking WP.  WP with clues, tx flagyl 500 two times a day x 7d.  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    Orders: [facility] - Est  18-39 yrs ([address]253)  Problem # 4:  OBESITY (ICD-278.00) Assessment: Unchanged  Exercise prescription given, wt loss handouts.  Pt asking for wt loss pills.  Has gained 38 lbs since last visit. Pt will call Dr. Gerilyn Pilgrim for appt.  Orders: [facility] - Est  18-39 yrs (66440)  Problem # 5:  HYPERTENSION (ICD-401.9) Assessment: Improved  BP well controlled off meds for several visits now.  Will take off problem list.  Orders: Basic Met-[facility] [id number]) [facility] - Est  18-39 yrs ([id number])  Problem # 6:  HYPOTHYROIDISM (ICD-244.9) Assessment: Unchanged  Last TSH WNL, will check TSH today and remove from list if normal.  Orders: TSH-[facility] ([id number]) [facility] - Est  18-39 yrs (66063)  Problem # 7:  GERD (ICD-530.81) Assessment: New  Some ST in the mornings, feels like GERD, asking for medication.  Has MAP.  Will rx nexium to [facility].  Her updated medication list for this problem includes:    Nexium 40 Mg Cpdr (Esomeprazole magnesium) ..... One tab by mouth qhs  Orders: [facility] - Est  18-39 yrs (01601)  Complete Medication List: 1)  Citalopram Hydrobromide 40 Mg Tabs (Citalopram hydrobromide) .... One tab by mouth daily 2)  Nexium 40 Mg Cpdr (Esomeprazole magnesium) .... One tab by mouth qhs 3)  Flagyl 500 Mg Tabs (Metronidazole) .... One tab by mouth two times a day x 7d  [id number]  Patient Instructions: 1)  Great to see you, 2)  Exercise:  Goal heart rate 130, for 30 mins a day, 5d a week, stationary bike would be good.  Don't miss days or it won't work. 3)  Dieting:  2000 calories a day or less, see handouts. 4)  Will let you know if anything is abnormal on your PAP. 5)  Come back to see me in a year or sooner if you have any questions or problems.  Call for refills on your Celexa. 6)   You have BV, will call in Flagyl (metronidazole). 7)  -Dr. Karie Schwalbe Prescriptions: FLAGYL 500 MG TABS (METRONIDAZOLE) One tab by mouth two times a day x 7d  #14 x 0   Entered and Authorized by:   Rodney Langton MD   Signed by:   Rodney Langton MD on 06/11/2010   Method used:   Faxed to ...       [facility] (retail)       [address] [place], [place]  [address]       Ph: [phone]       Fax: [phone]   RxID:   [id number] NEXIUM 40 MG CPDR (ESOMEPRAZOLE MAGNESIUM) One tab by mouth qHS  #90 x 3   Entered and Authorized by:   Rodney Langton MD   Signed by:   Rodney Langton MD on 06/11/2010   Method used:   Faxed to ...       [facility] Department (  retail)       [address] [place], [place]  [address]       Ph: [phone]       Fax: [phone]   RxID:   [id number]      Laboratory Results  Date/Time Received: June 11, 2010 11:56 AM  Date/Time Reported: June 11, 2010 12:06 PM   [employer] Source: vaginal WBC/hpf: 1-5 Bacteria/hpf: 3+  Cocci Clue cells/hpf: moderate  Positive whiff Yeast/hpf: none Trichomonas/hpf: none Comments: rod bacteria also present  ...........test performed by...........Marland KitchenTerese Door, CMA

## 2010-11-25 NOTE — Progress Notes (Signed)
Summary: triage  Phone Note Call from Patient Call back at Home Phone [phone]   Caller: Patient Summary of Call: Pt has sore throat, fever & smelly discharge.  Do we have anything that we can see her today? Initial call taken by: Clydell Hakim,  November 23, 2009 1:59 PM  Follow-up for Phone Call        sick x 2-3 days. taking ibu & allergy pills. no appt available. she wants to go to UC. told her that was ok Follow-up by: Golden Circle RN,  November 23, 2009 2:03 PM

## 2010-11-25 NOTE — Progress Notes (Signed)
Summary: Rx Req  Phone Note Call from Patient Call back at [facility] Phone [phone]   Caller: Patient Summary of Call: Wondering if she can get something stronger for pain.  Legs very painful.  Pharmacy [address] Initial call taken by: Clydell Hakim,  July 30, 2010 1:51 PM  Follow-up for Phone Call        Pt called to let us know that she wanted her labs and to say that she was going to get her ted hose fitted for. She says she still has lots of fluid on her legs (can write her name in her legs) and says the lasix isn't working anymore. She is going to get the pain meds prescribed and call back on monday. Just really wanted to know labs. Pt plans to call back on monday to talk to [place].  Follow-up by: Jamie Brookes MD,  July 30, 2010 8:02 PM  Additional Follow-up for Phone Call Additional follow up Details #1::        Sure, well see previous phone note for my interpretation of labs and what else I am waiting for.  She can double her lasix as long as she is taking her potassium if the current dose isn't working anymore. Additional Follow-up by: Rodney Langton MD,  August 01, 2010 9:43 PM    Additional Follow-up for Phone Call Additional follow up Details #2::    LVM for pt to call back Follow-up by: Jimmy Footman, CMA,  August 02, 2010 8:54 AM  Additional Follow-up for Phone Call Additional follow up Details #3:: Details for Additional Follow-up Action Taken: Pt returning Sara's call. Additional Follow-up by: Clydell Hakim,  August 02, 2010 12:20 PM  New/Updated Medications: NAPROXEN 500 MG TABS (NAPROXEN) One tab by mouth two times a day Prescriptions: NAPROXEN 500 MG TABS (NAPROXEN) One tab by mouth two times a day  #60 x 0   Entered and Authorized by:   Rodney Langton MD   Signed by:   Rodney Langton MD on 07/30/2010   Method used:   Electronically to        [facility] [phone]* (retail)       [address]       [place], [place]  [address]     Ph: [phone]       Fax: [phone]   RxID:   [id number]   Spoke with patient and informed of instructions and rx faxed in. Patient states that rx was picked up by her. I also gave her the info for her echo @ [facility] on 08/09/2010 @ 10am and 11am. She wrote this info down and has understanding of this.Jimmy Footman, CMA  August 03, 2010 9:25 AM

## 2010-11-25 NOTE — Progress Notes (Signed)
Summary: triage  Phone Note Call from Patient Call back at [phone]   Summary of Call: Having a persistant pain going down left arm and feels realy tired. Initial call taken by: Clydell Hakim,  April 07, 2010 1:34 PM  Follow-up for Phone Call        LM Follow-up by: Golden Circle RN,  April 07, 2010 1:52 PM  Additional Follow-up for Phone Call Additional follow up Details #1::        has not been taking any meds since last OV.  todaywoke up with  L arm & shoulder blade in pain. states it is  "agonizing pain" off & on. L shoulderblade is "tired" "feels like she has been working out but I have not been"   reviewed HX & sent her to ED. told her we have no appts & she must be seen today. ED is best. she agreed 7 will go Additional Follow-up by: Golden Circle RN,  April 07, 2010 1:59 PM    Additional Follow-up for Phone Call Additional follow up Details #2::    Noted, likely MSK but to ED or UCC as no further SDA slots. Follow-up by: Rodney Langton MD,  April 07, 2010 7:30 PM

## 2010-11-25 NOTE — Assessment & Plan Note (Signed)
Summary: swollen ankle,df   Vital Signs:  Patient profile:   35 year old female Height:      69 inches Weight:      364.8 pounds BMI:     54.07 Temp:     99.0 degrees F oral Pulse rate:   94 / minute BP sitting:   123 / 86  (right arm) Cuff size:   large  Vitals Entered By: Jimmy Footman, CMA (November 09, 2010 2:39 PM) CC: left ankle swollen x2 days, Abdominal Pain Pain Assessment Patient in pain? yes     Location: ankle Intensity: 10 Type: sharp   Primary Care Provider:  Rodney Langton MD  CC:  left ankle swollen x2 days and Abdominal Pain.  History of Present Illness: 35 yo female with MMP and L ankle pain.  Twisted ankle, inversion, several times in the past 3 d. Now feels persistent pain along the posterior aspect of the lateral malleolus with swelling.  Pain worse with all motions.  No popping, locking.  Was able to walk after twisting ankle each time.  Now feels that ankle is "not reliable."  No bruising, no fevers/chills, no rashes.    Current Medications (verified): 1)  Ranitidine Hcl 300 Mg Tabs (Ranitidine Hcl) .... One By Mouth Two Times A Day 2)  Lisinopril-Hydrochlorothiazide 20-12.5 Mg Tabs (Lisinopril-Hydrochlorothiazide) .... One Tab By Mouth Daily 3)  Knee High Compression Hose .... Extra Large.  Wear Daily 4)  Simethicone 125 Mg Chew (Simethicone) .... Use As Needed For Gas. 5)  Mobic 15 Mg Tabs (Meloxicam) .... One Tab By Mouth Daily For Pain 6)  Cam Walking Boot .... Wear On L Foot.  Allergies (verified): No Known Drug Allergies  Review of Systems       SEe HPI  Physical Exam  General:  Well-developed,well-nourished,in no acute distress; alert,appropriate and cooperative throughout examination Msk:  L ankle with significant swelling over lateral malleolus. TTP posterior to lateral malleolus but no tenderness over bone itself. No tenderness over fibular head.  ROM good with strength 5/5 to all movements but with pain to passive inversion and  active eversion, localized posterior to lateral malleolus. Positive Kleiger. Ankle anterior drawer neg. Squeeze test neg Thompson's test neg. No overt tenderness over ATFL Additional Exam:  MSK US performed, images taken of several structures.  Talar dome: Unremarkable. Extensor Digitorum Longus: some fluid noted in sheath. Extensor Hallucis Longus: Unremarkable Tibialis Anterior: Unremarkable. Peroneus Longus and Brevis: Significant fluid noted in sheath both posterior to the malleolus as well as Inferior to the malleolus. No effusion noted in Mortise. Unable to visualize ATFL or CFL.   Impression & Recommendations:  Problem # 1:  ANKLE PAIN, LEFT (ICD-719.47) Assessment New Symptoms, exam, and imaging suggestive of peroneal tendinopathy +/- sprain of lateral structures. Mobic daily. Would place in CAM walker however she may not be able to afford this, ACE wrapped.  She can use an ASO if unable to afford CAM. She is to RTC 2 weeks, if no better can re-ultrasound and consider injection into tendon sheath.  Orders: US EXTREMITY NON-VASC REAL-TIME IMG (16109) [facility]- Est  Level 4 ([address]454)  Complete Medication List: 1)  Ranitidine Hcl 300 Mg Tabs (Ranitidine hcl) .... One by mouth two times a day 2)  Lisinopril-hydrochlorothiazide 20-12.5 Mg Tabs (Lisinopril-hydrochlorothiazide) .... One tab by mouth daily 3)  Knee High Compression Hose  .... Extra large.  wear daily 4)  Simethicone 125 Mg Chew (Simethicone) .... Use as needed for gas. 5)  Mobic  15 Mg Tabs (Meloxicam) .... One tab by mouth daily for pain 6)  Cam Walking Boot  .... Wear on l foot.   Patient Instructions: 1)  Mobic for pain. 2)  Cam walking boot. 3)  Come back to see me in 2 weeks. 4)  -Dr. Karie Schwalbe. Prescriptions: CAM WALKING BOOT Wear on L foot.  #1 x 0   Entered and Authorized by:   Rodney Langton MD   Signed by:   Rodney Langton MD on 11/09/2010   Method used:   Print then Give to Patient   RxID:    [id number] MOBIC 15 MG TABS (MELOXICAM) One tab by mouth daily for pain  #30 x 0   Entered and Authorized by:   Rodney Langton MD   Signed by:   Rodney Langton MD on 11/09/2010   Method used:   Electronically to        [facility] [phone]* (retail)       [address]       [place], [place]  [address]       Ph: [phone]       Fax: [phone]   RxID:   [id number]    Orders Added: 1)  US EXTREMITY NON-VASC REAL-TIME IMG [34742] 2)  [facility]- Est  Level 4 [59563]

## 2010-11-25 NOTE — Assessment & Plan Note (Signed)
Summary: lower extremity swelling/[place]/t   Vital Signs:  Patient profile:   35 year old female Height:      69 inches Weight:      381 pounds BMI:     56.47 Temp:     99.0 degrees F oral Pulse rate:   85 / minute BP sitting:   138 / 89  (left arm) Cuff size:   regular  Vitals Entered By: Jimmy Footman, CMA (July 23, 2010 10:38 AM) CC: swelling both feet and ankles x 10 days Is Patient Diabetic? No Pain Assessment Patient in pain? yes     Location: lower back Type: aching   Primary Care Provider:  Rodney Langton MD  CC:  swelling both feet and ankles x 10 days.  History of Present Illness: 35 yo female, swelling LE x10d.  Swelling:  Associated with SOB, no worse at night, limits activity.  Went to ED and UCC recently, ISTAT chem normal, UA normal.  given lasix which helped a lot.  Swelling is below knees, painful/dull ache.  No injury.  She refuses to wear TED hose.  No fevers/chills, no N/V/D/C.  No CP.  Has gained 50 lbs since earlier this year.    Knee pain:  Bilateral, under patellae, worse with deep knee bending, walking up stairs.  Grinding sensation heard.  No injury, no knee swelling.  Habits & Providers  Alcohol-Tobacco-Diet     Tobacco Status: never  Current Medications (verified): 1)  Citalopram Hydrobromide 40 Mg Tabs (Citalopram Hydrobromide) .... One Tab By Mouth Daily 2)  Nexium 40 Mg Cpdr (Esomeprazole Magnesium) .... One Tab By Mouth Qhs 3)  Furosemide 40 Mg Tabs (Furosemide) .... One Tab By Mouth Bid 4)  Acetaminophen 650 Mg Cr-Tabs (Acetaminophen) .... One Tab By Mouth Three Times A Day As Needed Pain 5)  Klor-Con M20 20 Meq Cr-Tabs (Potassium Chloride Crys Cr) .... One Tab By Mouth Daily 6)  Knee High Compression Hose .... Extra Large.  Wear Daily  Allergies (verified): No Known Drug Allergies  Review of Systems       See HPI  Physical Exam  General:  Well-developed,well-nourished,in no acute distress; alert,appropriate and cooperative  throughout examination Lungs:  Normal respiratory effort, chest expands symmetrically. Lungs are clear to auscultation, no crackles or wheezes. Heart:  Normal rate and regular rhythm. S1 and S2 normal without gallop, murmur, click, rub or other extra sounds. Abdomen:  Bowel sounds positive,abdomen soft and non-tender without masses, organomegaly or hernias noted. Msk:  Knees normal to inspection, full ROM and strength, all ligaments intact.  Patellar grind positive with pain.    2+ pitting edema below knees.  NVI distally.    Tortuous veins noted in thighs.   Impression & Recommendations:  Problem # 1:  LEG EDEMA, BILATERAL (ICD-782.3) Assessment New Etiology unclear at this time.   Ddx includes: CHF, proteinuria, hypoalbuminemia, venous reflux. Will check 2D ECHO, BNP, CMET, CBC, UA. Varicose vein study to assess for superficial and deep venous reflux.  Increasing lasix to two times a day, added KCl 20 meq daily. TED hose rx'ed, pt will try this. BP up, will improve with increased lasix. Pt to fu with me after all testing done (in about 2 wks)  Her updated medication list for this problem includes:    Furosemide 40 Mg Tabs (Furosemide) ..... One tab by mouth bid  Orders: [facility]- Est  Level 4 (86578) Vascular Other (Vascular other)  Problem # 2:  SHORTNESS OF BREATH (ICD-786.05) Assessment: New See #  1.  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 Acetaminophen 650. Sports Med advisor handout/exercises given. Suspect this will not improve until she loses a lot of weight.  Her updated medication list for this problem includes:    Acetaminophen 650 Mg Cr-tabs (Acetaminophen) ..... One tab by mouth three times a day as needed pain  Orders: [facility]-  Est  Level 4 ([address])  Complete Medication List: 1)  Citalopram Hydrobromide 40 Mg Tabs (Citalopram hydrobromide) .... One tab by mouth daily 2)  Nexium 40 Mg Cpdr (Esomeprazole magnesium) .... One tab by mouth qhs 3)  Furosemide 40 Mg Tabs (Furosemide) .... One tab by mouth bid 4)  Acetaminophen 650 Mg Cr-tabs (Acetaminophen) .... One tab by mouth three times a day as needed pain 5)  Klor-con M20 20 Meq Cr-tabs (Potassium chloride crys cr) .... One tab by mouth daily 6)  Knee High Compression Hose  .... Extra large.  wear daily  Patient Instructions: 1)  Great to see you, 2)  Bloodwork. 3)  Urinalysis. 4)  Ultrasound of your heart. 5)  Vein studies to see if you have vein reflux. 6)  Increase furosemide to two times a day. 7)  Start taking potassium pills. 8)  Acetaminophen for pain in your kneecaps. 9)  Wear compression hose. 10)  Make appt to come back to see me in 1 week after all testing is done. 11)  -Dr. Karie Schwalbe. Prescriptions: FUROSEMIDE 40 MG TABS (FUROSEMIDE) One tab by mouth BID  #60 x 3   Entered and Authorized by:   Rodney Langton MD   Signed by:   Rodney Langton MD on 07/23/2010   Method used:   Print then Give to Patient   RxID:   [id number] KNEE HIGH COMPRESSION HOSE Extra large.  Wear daily  #1 box x 0   Entered and Authorized by:   Rodney Langton MD   Signed by:   Rodney Langton MD on 07/23/2010   Method used:   Print then Give to Patient   RxID:   [id number] KLOR-CON M20 20 MEQ CR-TABS (POTASSIUM CHLORIDE CRYS CR) One tab by mouth daily  #60 x 3   Entered and Authorized by:   Rodney Langton MD   Signed by:   Rodney Langton MD on 07/23/2010   Method used:   Print then Give to Patient   RxID:   [id number] ACETAMINOPHEN 650 MG CR-TABS (ACETAMINOPHEN) One tab by mouth three times a day as needed pain  #60 x 0   Entered and Authorized by:   Rodney Langton MD   Signed by:   Rodney Langton MD on 07/23/2010    Method used:   Print then Give to Patient   RxID:   [id number] FUROSEMIDE 40 MG TABS (FUROSEMIDE) One tab by mouth BID  #60 x 3   Entered and Authorized by:   Rodney Langton MD   Signed by:   Rodney Langton MD on 07/23/2010   Method used:   Historical   RxID:   [id number]   Laboratory Results   Urine Tests  Date/Time Received: July 23, 2010 11:08 AM  Date/Time Reported: July 23, 2010 11:12 AM   Routine Urinalysis   Color: straw Appearance: Clear Glucose: negative   (Normal Range: Negative) Bilirubin: negative   (Normal Range: Negative) Ketone:  negative   (Normal Range: Negative) Spec. Gravity: 1.010   (Normal Range: 1.003-1.035) Blood: negative   (Normal Range: Negative) pH: 6.0   (Normal Range: 5.0-8.0) Protein: negative   (Normal Range: Negative) Urobilinogen: 0.2   (Normal Range: 0-1) Nitrite: negative   (Normal Range: Negative) Leukocyte Esterace: negative   (Normal Range: Negative)    Comments: ...............test performed by......Marland KitchenBonnie A. [place], MLS (ASCP)cm

## 2010-11-25 NOTE — Progress Notes (Signed)
Summary: results  Phone Note Call from Patient Call back at Home Phone [phone]   Caller: Patient Summary of Call: pt brought urine in yesterday and wants to know what results are Initial call taken by: De Nurse,  August 31, 2010 4:27 PM  Follow-up for Phone Call        tried to call pt line was busy Follow-up by: Loralee Pacas CMA,  September 01, 2010 12:18 PM  Additional Follow-up for Phone Call Additional follow up Details #1::        informed pt that results have not been entered in at this time Additional Follow-up by: Loralee Pacas CMA,  September 02, 2010 12:09 PM

## 2010-11-25 NOTE — Progress Notes (Signed)
  Phone Note Call from Patient   Caller: Patient Call For: [phone] Summary of Call: Patient calling regarding Echo results.  Please call her.  Her BP reading was 178/85.  She found this to be out of the ordinary. Initial call taken by: Abundio Miu,  August 10, 2010 2:00 PM  Follow-up for Phone Call        ECHO was normal, had some changes noted with hypertension.  She can see me regarding the BP. Follow-up by: Rodney Langton MD,  August 10, 2010 2:09 PM

## 2010-11-25 NOTE — Letter (Signed)
Summary: Handout Printed  Printed Handout:  - Diet - 2000 Calorie Diabetic

## 2010-11-25 NOTE — Miscellaneous (Signed)
Summary: Medicaid prior auth  Filled out prior auth for CAM boot, dx peroneal tendinopathy. Rodney Langton MD  November 19, 2010 12:08 PM

## 2010-11-25 NOTE — Letter (Signed)
Summary: Handout Printed  Printed Handout:  - Canker Sores 

## 2010-11-25 NOTE — Progress Notes (Signed)
 Summary: Test Res  Phone Note Call from Patient Call back at [phone]   Caller: Patient Summary of Call: calling about her test results. Initial call taken by: Madelin Daring,  April 28, 2009 10:25 AM  Follow-up for Phone Call        will send message to MD. Follow-up by: Avelina Sharps RN,  April 28, 2009 10:25 AM  Additional Follow-up for Phone Call Additional follow up Details #1::        They were negative, GC and Chlam. Additional Follow-up by: Debby Petties MD,  April 28, 2009 12:19 PM      Appended Document: Test Res patient notified.

## 2010-11-25 NOTE — Assessment & Plan Note (Signed)
Summary: mouth sores/bmc   Vital Signs:  Patient profile:   35 year old female Weight:      381 pounds Temp:     99 degrees F oral Pulse rate:   89 / minute Pulse rhythm:   regular BP sitting:   154 / 92  (left arm) Cuff size:   large  Vitals Entered By: Loralee Pacas CMA (September 08, 2010 10:26 AM) CC: mouth sores x 2 days Comments pt stated that she has been feeling bad for the past two days and thats when the sores appeared   Primary Care Provider:  Rodney Langton MD  CC:  mouth sores x 2 days.  History of Present Illness: 35 yo female with obesity, LE swelling, mouth sores.  Mouth sores:  Present a couple of days now, also has been feeling tired, sore throat, URI symptoms.  subjective fevers/chills for 4 days now.  No SOB/wheeze/cough/N/V/D/C/facial pain/pressure.  Sores are overall getting better now.  LE swelling:  ECHO, BNP, chem, TSH, LE dopplers all negative.  Better with furosemide and compression hose.  Obesity:  She claims she eats only a few hundred calories but has lost only 5 lbs.  HTN:  BP still elevated, neg metanephrines, chemistries, cortisol.  Current Medications (verified): 1)  Ranitidine Hcl 300 Mg Tabs (Ranitidine Hcl) .... One By Mouth Two Times A Day 2)  Lisinopril-Hydrochlorothiazide 20-12.5 Mg Tabs (Lisinopril-Hydrochlorothiazide) .... One Tab By Mouth Daily 3)  Knee High Compression Hose .... Extra Large.  Wear Daily 4)  Simethicone 125 Mg Chew (Simethicone) .... Use As Needed For Gas.  Allergies (verified): No Known Drug Allergies  Past History:  Past Medical History: Depression Hypertension (neg cortisol, metanephrines) history of migraines Obesity Hypothyroidism PHQ-9: 04/24/09: 16 (Moderately Severe Depression, started Celexa 20) Bilateral Lower Ext edema (neg ECHO, BNP, Chemistries, TSH, U/A, LE dopplers)  Review of Systems       See hPI  Physical Exam  General:  Well-developed,well-nourished,in no acute distress;  alert,appropriate and cooperative throughout examination Mouth:  Shallow ulcerations with whitish cover present in multiple areas in mouth. Neck:  No deformities, masses, or tenderness noted. Lungs:  Normal respiratory effort, chest expands symmetrically. Lungs are clear to auscultation, no crackles or wheezes. Heart:  Normal rate and regular rhythm. S1 and S2 normal without gallop, murmur, click, rub or other extra sounds. Extremities:  2+ LE edema, compression hose on.   Impression & Recommendations:  Problem # 1:  APHTHOUS ULCERS (ICD-528.2) Assessment New Likely related to her viral illness. Improving and she does not wish to pursue numbeing ointments. Will watch for resolution. OTC NSAIDS for pain.  Orders: [facility]- Est  Level 4 ([address])  Problem # 2:  HYPERTENSION (ICD-401.9) Assessment: New Starting combo antihypertensives. RTC 1-2 weeks to recheck BP and chemistries.  Her updated medication list for this problem includes:    Lisinopril-hydrochlorothiazide 20-12.5 Mg Tabs (Lisinopril-hydrochlorothiazide) ..... One tab by mouth daily  Orders: [facility]- Est  Level 4 (60454)  Problem # 3:  LEG EDEMA, BILATERAL (ICD-782.3) Assessment: Improved Better with compression. Will follow.  Her updated medication list for this problem includes:    Lisinopril-hydrochlorothiazide 20-12.5 Mg Tabs (Lisinopril-hydrochlorothiazide) ..... One tab by mouth daily  Problem # 4:  GERD (ICD-530.81) Assessment: Unchanged Unable to afford nexium Changed to ranitidine.  Orders: [facility]- Est  Level 4 (09811)  Her updated medication list for this problem includes:    Ranitidine Hcl 300 Mg Tabs (Ranitidine hcl) ..... One by mouth two times a day  Problem # 5:  OBESITY (ICD-278.00) Assessment: Unchanged Pt to complete food diary, I will review and refer to nutritionist as needed.  Orders: [facility]- Est  Level 4 (11914)  Problem # 6:  DEPRESSION (ICD-311) Assessment: Unchanged Pt wishes to stop  celexa. No side effects, just wants to stop it.  The following medications were removed from the medication list:    Citalopram Hydrobromide 40 Mg Tabs (Citalopram hydrobromide) ..... One tab by mouth daily  Complete Medication List: 1)  Ranitidine Hcl 300 Mg Tabs (Ranitidine hcl) .... One by mouth two times a day 2)  Lisinopril-hydrochlorothiazide 20-12.5 Mg Tabs (Lisinopril-hydrochlorothiazide) .... One tab by mouth daily 3)  Knee High Compression Hose  .... Extra large.  wear daily 4)  Simethicone 125 Mg Chew (Simethicone) .... Use as needed for gas.  Patient Instructions: 1)  Do a food diary, write down everything you eat and drink for 3 days. 2)  Stop potassium, furosemide, nexium 3)  Start Ranitidine, lisinopril/hctz combo pill, simethicone (gas). 4)  Come back to see me in 1-2 weeks to recheck BP, go over food diary. 5)  -Dr. Karie Schwalbe. Prescriptions: SIMETHICONE 125 MG CHEW (SIMETHICONE) Use as needed for gas.  #1 box x 6   Entered and Authorized by:   Rodney Langton MD   Signed by:   Rodney Langton MD on 09/08/2010   Method used:   Electronically to        [facility] [phone]* (retail)       [address]       [place], [place]  [address]       Ph: [phone]       Fax: [phone]   RxID:   [id number] LISINOPRIL-HYDROCHLOROTHIAZIDE 20-12.5 MG TABS (LISINOPRIL-HYDROCHLOROTHIAZIDE) One tab by mouth daily  #90 x 0   Entered and Authorized by:   Rodney Langton MD   Signed by:   Rodney Langton MD on 09/08/2010   Method used:   Electronically to        [facility] [phone]* (retail)       [address]       [place], [place]  [address]       Ph: [phone]       Fax: [phone]   RxID:   [id number] RANITIDINE HCL 300 MG TABS (RANITIDINE HCL) One by mouth two times a day  #60 x 3   Entered and Authorized by:   Rodney Langton MD   Signed by:   Rodney Langton MD on 09/08/2010   Method used:   Electronically to        [facility] [phone]* (retail)       [address]       [place], [place]  [address]       Ph: [phone]       Fax: [phone]   RxID:   [id number]    Orders Added: 1)  [facility]- Est  Level 4 [81829]

## 2010-11-25 NOTE — Progress Notes (Signed)
Summary: results  Phone Note Call from Patient Call back at Home Phone [phone]   Caller: Patient Summary of Call: pt is requesting lab results Initial call taken by: De Nurse,  August 19, 2010 4:38 PM  Follow-up for Phone Call        Blood tests completely normal, urine tests not back yet. Follow-up by: Rodney Langton MD,  August 19, 2010 5:05 PM  Additional Follow-up for Phone Call Additional follow up Details #1::        LMOVM for pt to call back Additional Follow-up by: Jone Baseman CMA,  August 19, 2010 5:12 PM    Additional Follow-up for Phone Call Additional follow up Details #2::    left message on vm for pt to return call. Follow-up by: Garen Grams LPN,  August 20, 2010 10:15 AM  Additional Follow-up for Phone Call Additional follow up Details #3:: Details for Additional Follow-up Action Taken: Patient informed of normal bloodwork, states she will bring in urine on Monday. Additional Follow-up by: Garen Grams LPN,  August 20, 2010 10:43 AM

## 2010-11-25 NOTE — Progress Notes (Signed)
  Phone Note Call from Patient   Caller: Patient Call For: [phone] Summary of Call: Patient calling regarding lab results.  Need you to call back with info Initial call taken by: Abundio Miu,  July 29, 2010 1:34 PM  Follow-up for Phone Call        All bloodwork normal, unlikely to be her heart.  Kidneys also normal.  Need to see ECHO and vein study (dont see that this has been done yet).  Wearing TED hose?  Better with hose and lasix? Follow-up by: Rodney Langton MD,  July 29, 2010 3:03 PM  Additional Follow-up for Phone Call Additional follow up Details #1::        lvm for pt to return call Additional Follow-up by: Loralee Pacas CMA,  July 30, 2010 4:17 PM

## 2010-11-25 NOTE — Miscellaneous (Signed)
  Clinical Lists Changes  Problems: Removed problem of SHORTNESS OF BREATH (ICD-786.05) Removed problem of SCREENING FOR MALIGNANT NEOPLASM OF THE CERVIX (ICD-V76.2) Removed problem of VENEREAL WART (ICD-078.11) Removed problem of AMENORRHEA (ICD-626.0) Removed problem of DENTAL PAIN (ICD-525.9) Removed problem of LEUKORRHEA (ICD-623.5) Removed problem of FATIGUE (ICD-780.79) Removed problem of FAMILY HISTORY DIABETES 1ST DEGREE RELATIVE (ICD-V18.0)

## 2010-11-25 NOTE — Progress Notes (Signed)
 Summary: Rx Req  Phone Note Refill Request Call back at Home Phone [phone] Message from:  Patient  Refills Requested: Medication #1:  CITALOPRAM HYDROBROMIDE 20 MG TABS One tab by mouth daily. PT USES [facility] ON [address]. PT HAS F/UP APP ON 08/03/09.  Initial call taken by: Madelin Daring,  July 21, 2009 10:25 AM  Follow-up for Phone Call        to pcp Follow-up by: Ginnie Mau RN,  July 21, 2009 10:31 AM  Additional Follow-up for Phone Call Additional follow up Details #1::        Rx called to pharmacy Additional Follow-up by: Debby Petties MD,  July 21, 2009 10:38 AM    Prescriptions: CITALOPRAM HYDROBROMIDE 20 MG TABS (CITALOPRAM HYDROBROMIDE) One tab by mouth daily  #15 x 0   Entered and Authorized by:   Debby Petties MD   Signed by:   Debby Petties MD on 07/21/2009   Method used:   Electronically to        [facility] [phone]* (retail)       [address]       [place], [place]  [address]       Ph: [phone]       Fax: [phone]   RxID:   [id number]

## 2010-11-25 NOTE — Assessment & Plan Note (Signed)
 Summary: Haley Wyatt  DPG   Vital Signs:  Patient profile:   35 year old female Height:      69 inches Weight:      335.6 pounds BMI:     49.74 Temp:     99.4 degrees F Pulse rate:   108 / minute BP sitting:   110 / 84 CC: np Is Patient Diabetic? No Pain Assessment Patient in pain? no        Primary Care Provider:  Debby Petties MD  CC:  np.  History of Present Illness: 33F with obesity, HTN, hyperthyroidism, fatigue comes in for Haley of these issues as well as c/o vaginal discharge.  Obesity:  Was supposed to make appt with Nutritionist but never did.  HTN:  BP elevated at last visit but normal at this visit.  No longer taking any medication.  Hypothyroidism:  Normal TFTs off all medications.  Fatigue:  Improving.  Normal CBC, CMET, TFTs.  Feels like Haley Wyatt is under a lot of stress.  Vaginal discharge:  Malodorus per pt.  No itching, no burning, no fevers/chills, no urinary urgency or frequency.  No abd pain, no N/V/D/C.  Tooth Pain:  Now also c/o some pain on left mandibular second molar.  Habits & Providers  Alcohol-Tobacco-Diet     Tobacco Status: quit > 6 months  Past History:  Past Medical History: Last updated: 04/16/2009 Depression Hypertension history of migraines Obesity Hypothyroidism  Past Surgical History: Last updated: 04/16/2009 Tubal ligation 1999 Dr. Aida Na Carpal tunnel release 1996 Dr. Charlott Shoulder Arthroscopy and foreign body removal 2007 Dr. Elspeth Aspen [facility].  Family History: Last updated: 04/16/2009 Diabetes 1st degree relative- father Hypertension ETOH abuse Colon Ca Grandmother had MI in her 72s  Social History: Last updated: 04/16/2009 Pt lives with 2 sons born in 20 and 1999 and daughter born in 79.  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 Haley Wyatt quit smoking cigarettes in 2009, drinks wine approximately 2  times per year, uses no illicit drugs.  Does not exercise.  Has a Dog.  Social History: Smoking Status:  quit > 6 months  Review of Systems       See HPI  Physical Exam  General:  Well-developed,well-nourished,in no acute distress; alert,appropriate and cooperative throughout examination Mouth:  Oral mucosa and oropharynx without lesions or exudates.  Teeth in good repair. Lungs:  Normal respiratory effort, chest expands symmetrically. Lungs are clear to auscultation, no crackles or wheezes. Heart:  Normal rate and regular rhythm. S1 and S2 normal without gallop, murmur, click, rub or other extra sounds. Genitalia:  Normal introitus for age, no external lesions, translucent vaginal discharge, no abnormal odor, mucosa pink and moist, no vaginal or cervical lesions, no vaginal atrophy, no friaility or hemorrhage, normal uterus size and position, no adnexal masses or tenderness   Impression & Recommendations:  Problem # 1:  DENTAL PAIN (ICD-525.9) Assessment New Unsure etiology, refer to dentist.  Orders: Dental Referral (Dentist) [facility]- Est  Level 4 (00785)  Problem # 2:  LEUKORRHEA (ICD-623.5) Assessment: Unchanged Negative wet prep, will follow up GC/Chlam, likely physiologic discharge if negative.  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  Pt to fill out PHQ-9 to get baseline score and then treat  and follow serial PHQ-9's.  Would opt not to restart COC for now.  Problem # 4:  OBESITY (ICD-278.00) Assessment: Unchanged Pt to make appt with Dr. Wonda.  Orders: [facility]- Est  Level 4 (99214)  Problem # 5:  HYPOTHYROIDISM (ICD-244.9) Assessment: Improved Resolved.  Normal TFTs off all thyroid  medication.  Orders: [facility]- Est  Level 4 (00785)  Problem #  6:  HYPERTENSION (ICD-401.9) Assessment: Improved Not present today, normal BP.  No addition of BP meds.  Orders: [facility]- Est  Level 4 (00785)  Patient Instructions: 1)  Great to see you again today.  Your blood pressure was normal today.   2)  Please make an appt to see our Nutritionist/Dietitian Dr. Wonda! 3)  I have made you a dental referral. 4)  Your wet prep was negative.  We are still waiting on the GC/Chlam test which could take a couple of days.  I will call you if there are any anormalities. 5)  Please fill out the PHQ-9 questionairre and drop it off with the front desk at any time. 6)  Come back to see me in 6 months. 7)  -Dr. ONEIDA.  Laboratory Results  Date/Time Received: April 24, 2009 4:38 PM  Date/Time Reported: April 24, 2009 4:44 PM   [employer] Source: vaginal WBC/hpf: 0-3 Bacteria/hpf: 3+  Rods Clue cells/hpf: none  Negative whiff Yeast/hpf: none Trichomonas/hpf: none Comments: ...........test performed by...........SABRAArland Morel, CMA    Appended Document: Haley PER Hiren Peplinski  DPG     Past History:  Past Medical History: Depression Hypertension history of migraines Obesity Hypothyroidism  PHQ-9: 04/24/09: 16 (Moderately Severe Depression, started Celexa 20)   Complete Medication List: 1)  Citalopram Hydrobromide 20 Mg Tabs (Citalopram hydrobromide) .... One tab by mouth daily Prescriptions: CITALOPRAM HYDROBROMIDE 20 MG TABS (CITALOPRAM HYDROBROMIDE) One tab by mouth daily  #60 x 0   Entered and Authorized by:   Debby Petties MD   Signed by:   Debby Petties MD on 05/11/2009   Method used:   Electronically to        [facility] [phone]* (retail)       [address]       [place], [place]  [address]       Ph: [phone]       Fax: [phone]   RxID:   [id number]   Appended Document: Haley PER Ples Trudel  DPG Called pt on cell, discussed treatment and that it could take up to 6 weeks to notice symptom benefit.  Haley Wyatt  agrees to take medicine.  Pt also agreed to call office and make appt to see me in 8 weeks to reassess depression.  -Dr. ONEIDA.

## 2010-12-01 ENCOUNTER — Emergency Department (HOSPITAL_COMMUNITY)
Admission: EM | Admit: 2010-12-01 | Discharge: 2010-12-02 | Disposition: A | Discharge status: Home / Self Care | Payer: Medicaid Other | Attending: Emergency Medicine | Admitting: Emergency Medicine

## 2010-12-01 DIAGNOSIS — M62838 Other muscle spasm: Secondary | ICD-10-CM | POA: Insufficient documentation

## 2010-12-01 DIAGNOSIS — M545 Low back pain, unspecified: Secondary | ICD-10-CM | POA: Insufficient documentation

## 2010-12-01 DIAGNOSIS — R262 Difficulty in walking, not elsewhere classified: Secondary | ICD-10-CM | POA: Insufficient documentation

## 2010-12-01 DIAGNOSIS — E039 Hypothyroidism, unspecified: Secondary | ICD-10-CM | POA: Insufficient documentation

## 2010-12-01 DIAGNOSIS — Z79899 Other long term (current) drug therapy: Secondary | ICD-10-CM | POA: Insufficient documentation

## 2010-12-01 DIAGNOSIS — K219 Gastro-esophageal reflux disease without esophagitis: Secondary | ICD-10-CM | POA: Insufficient documentation

## 2010-12-01 DIAGNOSIS — M79609 Pain in unspecified limb: Secondary | ICD-10-CM | POA: Insufficient documentation

## 2010-12-06 ENCOUNTER — Encounter: Payer: Self-pay | Admitting: Family Medicine

## 2010-12-06 ENCOUNTER — Ambulatory Visit (INDEPENDENT_AMBULATORY_CARE_PROVIDER_SITE_OTHER): Payer: Medicaid Other | Admitting: Family Medicine

## 2010-12-06 ENCOUNTER — Telehealth: Payer: Self-pay | Admitting: Sports Medicine

## 2010-12-06 VITALS — BP 161/101 | HR 112 | Temp 98.4°F | Ht 71.0 in | Wt 371.4 lb

## 2010-12-06 DIAGNOSIS — M25552 Pain in left hip: Secondary | ICD-10-CM

## 2010-12-06 DIAGNOSIS — R1030 Lower abdominal pain, unspecified: Secondary | ICD-10-CM | POA: Insufficient documentation

## 2010-12-06 DIAGNOSIS — M25559 Pain in unspecified hip: Secondary | ICD-10-CM

## 2010-12-06 DIAGNOSIS — R195 Other fecal abnormalities: Secondary | ICD-10-CM | POA: Insufficient documentation

## 2010-12-06 MED ORDER — PREDNISONE 10 MG PO TABS: ORAL_TABLET | ORAL | Status: DC

## 2010-12-06 MED ORDER — HYDROCODONE-IBUPROFEN 7.5-200 MG PO TABS: 1.0000 | ORAL_TABLET | Freq: Four times a day (QID) | ORAL | Status: DC | PRN

## 2010-12-06 MED ORDER — DIAZEPAM 5 MG PO TABS: 5.0000 mg | ORAL_TABLET | Freq: Four times a day (QID) | ORAL | Status: AC | PRN

## 2010-12-06 NOTE — Patient Instructions (Signed)
We are going to get a hip x-ray and I will call you with results. Go to [facility] or [facility].  Use the Valium medicine to relax the spasms in your hip when you have a chance to lay down. It will make you sleepy so don't drive while taking it.  Use the Prednisone daily until it is gone as prescribed on the bottle. It is a very specific regimen so follow it exactly.  Come back to see Dr. Karie Schwalbe in 1 week if not improved.

## 2010-12-06 NOTE — Assessment & Plan Note (Signed)
FOB neg, pt has been taking Percocet b/c of leg pain, she has gotten constipated and then took a laxative and 1/2 bottle of pepto bismal. Then she had 2 days of dark stools. Tested today and found to be FOB neg. No hemorroids.

## 2010-12-06 NOTE — Assessment & Plan Note (Addendum)
Pt is very tender and cried several times during the exam.  Discussed with Dr. Nedra Hai (Sports Med Fellow) and decided to put her on a prednisone taper and get left hip x-ray. Pt kept out of work until Wednesday. Advised to use Valium at night for muscle spasms or when she can relax at home. Pt to return in 1 week to see PCP if not improving.

## 2010-12-06 NOTE — Progress Notes (Signed)
  Subjective:    Patient ID: Haley Wyatt, female    DOB: Aug 01, 1976, 35 y.o.   MRN: [id number]  HPI Left Hip Pain: Pt has been having left hip pain since Tuesday night. She did not have any injury, the pain came on spontaneously. She went to the hospital Wed. Night and was evaluated in the ED. They gave her Flexeril, Percocet and Ibuprofen. She says it has been helping some but her pain is still quite severe. She has pain that radiates down toward her groin and down into her knee. She has no bowel or bladder changes (other than some constipation from the  Percocet). She did feel like her leg was going to give out once and fell down onto her couch. Pt evaluated with Dr. Nedra Hai (Sports Med Fellow).   Stool: Pt had some very dark stool on Friday and Sat. But she had taken some laxative and drank a half of Pepto-Bismal bottle on Thursday night. No bright red blood. No h/o hemorrhoids.    Review of Systems ROX neg except as noted in HPI.     Objective:   Physical Exam GEN: Pt appears to be in pain, she is lying on her Rt side. No signs of trauma HEENT:  [place]/AT, PERRL, EOMI MSK: Pt has tenderness with palpation of left SI joint, she has tenderness with movement of left hip including internal rotation and external rotation. She has normal sensation throughout her legs bilaterally. Her cap refill time is normal bilaterally. No bruising. WU:[id number] exam done, no hemmoroids, FOB neg, no blood noted, normal exam.          Assessment & Plan:

## 2010-12-06 NOTE — Telephone Encounter (Signed)
I will send in Vicoprofin for her to take every 6 hours for pain.

## 2010-12-07 ENCOUNTER — Telehealth: Payer: Self-pay | Admitting: Sports Medicine

## 2010-12-07 ENCOUNTER — Other Ambulatory Visit: Payer: Self-pay | Admitting: Family Medicine

## 2010-12-07 ENCOUNTER — Ambulatory Visit (HOSPITAL_COMMUNITY)
Admission: RE | Admit: 2010-12-07 | Discharge: 2010-12-07 | Disposition: A | Discharge status: Home / Self Care | Payer: Medicaid Other | Source: Ambulatory Visit | Attending: Family Medicine | Admitting: Family Medicine

## 2010-12-07 DIAGNOSIS — R52 Pain, unspecified: Secondary | ICD-10-CM

## 2010-12-07 DIAGNOSIS — M25559 Pain in unspecified hip: Secondary | ICD-10-CM | POA: Insufficient documentation

## 2010-12-07 DIAGNOSIS — M25552 Pain in left hip: Secondary | ICD-10-CM

## 2010-12-07 NOTE — Telephone Encounter (Signed)
Paged Dr.Strother and she states she faxed in pain med and tried to send the other two meds electronically.  I called [facility] and the pain med Vicoprofen is on back order.  They have not received any other meds for patient. Dr. Clotilde Dieter will call [facility] and get this straight. Patient notified.

## 2010-12-24 ENCOUNTER — Ambulatory Visit (INDEPENDENT_AMBULATORY_CARE_PROVIDER_SITE_OTHER): Payer: Medicaid Other | Admitting: Sports Medicine

## 2010-12-24 VITALS — BP 130/90 | Ht 68.25 in | Wt 366.4 lb

## 2010-12-24 DIAGNOSIS — M25552 Pain in left hip: Secondary | ICD-10-CM

## 2010-12-24 DIAGNOSIS — M25559 Pain in unspecified hip: Secondary | ICD-10-CM

## 2010-12-24 MED ORDER — HYDROCODONE-ACETAMINOPHEN 5-500 MG PO TABS: 1.0000 | ORAL_TABLET | ORAL | Status: DC | PRN

## 2010-12-24 NOTE — Patient Instructions (Signed)
Presumptive diagnosis of osteitis pubis. Would like you to keep a log of when you get the pain, what motion causes it, EXACTLY where you feel it. Come back to see me after you have done this. In the meantime continue your percocet or vicodin for pain and keep active.  -Dr. Karie Schwalbe.

## 2010-12-24 NOTE — Progress Notes (Signed)
  Subjective:    Patient ID: Haley Wyatt, female    DOB: 06-21-1976, 35 y.o.   MRN: [id number]  HPI Pt returns with hip/groin pain.  Was seen a month ago, etiology of pain was unclear, she was tx with valium and pred taper.  This may have helped a little but pain is recurring.  Vicodin does help.  She did start exercising within the past few months.  Pain is poorly localized, she thinks it may be somewhere between vagina and anus but on the L side. Unable to tell me what makes it worse but did note she was unable to get up off the toilet last occurrence.  No radiation.  Sharp in nature.  No back pain.  No bowel/bladder problems.  Intermittent.     Review of Systems    See HPI Objective:   Physical Exam  Constitutional: No distress.       Morbidly obese  Musculoskeletal:       L Hip: ROM IR: 30 Deg, ER: 45 Deg, Flexion: 120 Deg, Extension: 100 Deg, Abduction: 20 Deg, Adduction: 45 Deg (limted by pannus) Strength IR: 5/5, ER: 5/5, Flexion: 5/5, Extension: 5/5, Abduction: 5/5, Adduction: 5/5 Pelvic alignment unremarkable to inspection and palpation. Standing hip rotation and gait without trendelenburg sign / unsteadiness. Greater trochanter without tenderness to palpation. No tenderness over piriformis and greater trochanter. No pain with FABER or FADIR. No SI joint tenderness and normal minimal SI movement.           Assessment & Plan:

## 2010-12-24 NOTE — Assessment & Plan Note (Signed)
Unclear etiology as pt unable to express location or precipitating factors. Her pannus interferes with exam. She is to keep a log of what causes it and exactly where she gets the pain. Presumptive dx enthesiopathy of adductor origin. Refilled vicodin. May stop flexeril and valium. XR were unremarkable. Adductor/osteitis pubis rehab exercises given. RTC 1 week.

## 2010-12-28 NOTE — Telephone Encounter (Signed)
Has pt been nofitifed of results?

## 2010-12-28 NOTE — Telephone Encounter (Signed)
What results?  I didn't order any imaging studies... Her XR hip ordered by another provider was negative.

## 2011-01-06 LAB — PREGNANCY, URINE: Preg Test, Ur: NEGATIVE

## 2011-01-06 LAB — URINALYSIS, ROUTINE W REFLEX MICROSCOPIC
Glucose, UA: NEGATIVE mg/dL
Nitrite: NEGATIVE
Specific Gravity, Urine: 1.021 (ref 1.005–1.030)

## 2011-01-06 LAB — URINE MICROSCOPIC-ADD ON

## 2011-01-06 LAB — POCT I-STAT, CHEM 8
Calcium, Ion: 1.19 mmol/L (ref 1.12–1.32)
Chloride: 103 mEq/L (ref 96–112)
HCT: 43 % (ref 36.0–46.0)
Sodium: 138 mEq/L (ref 135–145)
TCO2: 28 mmol/L (ref 0–100)

## 2011-01-06 LAB — POCT RAPID STREP A (OFFICE): Streptococcus, Group A Screen (Direct): NEGATIVE

## 2011-02-02 ENCOUNTER — Ambulatory Visit (INDEPENDENT_AMBULATORY_CARE_PROVIDER_SITE_OTHER): Payer: Medicaid Other | Admitting: Family Medicine

## 2011-02-02 ENCOUNTER — Ambulatory Visit (HOSPITAL_COMMUNITY)
Admission: RE | Admit: 2011-02-02 | Discharge: 2011-02-02 | Disposition: A | Discharge status: Home / Self Care | Payer: Medicaid Other | Source: Ambulatory Visit | Attending: Sports Medicine | Admitting: Sports Medicine

## 2011-02-02 ENCOUNTER — Ambulatory Visit: Payer: Self-pay | Admitting: Family Medicine

## 2011-02-02 DIAGNOSIS — I10 Essential (primary) hypertension: Secondary | ICD-10-CM

## 2011-02-02 DIAGNOSIS — R079 Chest pain, unspecified: Secondary | ICD-10-CM | POA: Insufficient documentation

## 2011-02-02 DIAGNOSIS — M25519 Pain in unspecified shoulder: Secondary | ICD-10-CM

## 2011-02-02 DIAGNOSIS — M25511 Pain in right shoulder: Secondary | ICD-10-CM | POA: Insufficient documentation

## 2011-02-02 LAB — BASIC METABOLIC PANEL
BUN: 12 mg/dL (ref 6–23)
Creat: 0.77 mg/dL (ref 0.40–1.20)
Potassium: 4.1 mEq/L (ref 3.5–5.3)

## 2011-02-02 NOTE — Assessment & Plan Note (Addendum)
Atypical chest pain, pt has HTN and is obese. EKG normal, normal exam,no respiratory disease. Based on history d dimer obtained, morbidly obese sedentary- negative d dimer. This is likley secondary to her deconditioning with re-assuring work up today. 2D Echo would be reasonable based on pt history HTN, leg edema

## 2011-02-02 NOTE — Patient Instructions (Addendum)
This is likely a muscle spasm- take the flexeril as prescribed Try to stretch out your shoulder - with arm circles and stretches across the chest I will call you with lab results Your EKG was normal Return or go to ER if you have chest pain again or have difficulty breathing

## 2011-02-02 NOTE — Assessment & Plan Note (Addendum)
musculosketal pain, unclear cause of injury, currently improving. She can continue her home regimine of flexeril and try stretching

## 2011-02-02 NOTE — Progress Notes (Signed)
  Subjective:    Patient ID: Haley Wyatt, female    DOB: 1976/05/12, 35 y.o.   MRN: [id number]  HPI   Right shoulder pain x 2 days, no specific injury , pain over shoulder blade, pain is sharp and aching, but has improved over the past 24 hours with ibuprofen and flexeril.   SOB- yesterday while walking became acutely SOB, felt this was different from when she typically walks, did not feel like she was overly exerting herself. SOB was associated with chest tightness which she has has in the past. No recent illness, no URI, not typical of her GERD.Episodes occurred with movement and were intermittent throughout the day. No leg swelling, car ride 3 hours this weekend  Review of Systems per above     Objective:   Physical Exam  GEN- morbidly obese, NAD, speaks in full sentences  NECK- supple,normal ROM  CVS- RRR, no  Murmur, chest wall non tender  RESP- CTAB, normal WOB, no wheeze, no rhonchi  EXT- Shoulder- rotator cuff in tact, motor 5/5 bilat upper ext,     TTP at inferior and medial border of scalpula, no winged scapular    Sensation grossly in tact   No impingement      EKG- NSR, no ST changes, initial EKG- wrong lead placement  Assessment & Plan:

## 2011-02-03 ENCOUNTER — Telehealth: Payer: Self-pay | Admitting: *Deleted

## 2011-02-03 NOTE — Telephone Encounter (Signed)
Message copied by Tessie Fass on Thu Feb 03, 2011 10:34 AM ------      Message from: Milinda Antis      Created: Wed Feb 02, 2011  3:52 PM       Please let Ms. Viglione know her blood work was normal. Her lab to look for blood clots was negative. Her electrolytes and kidney function was negative. I want her to continue to work on her walking. If her breathing gets worse she should return. All of her studies today for her chest tightness and feeling winded were normal.

## 2011-02-03 NOTE — Telephone Encounter (Signed)
Called patient and gave message from Dr Tyler..Haley Wyatt  

## 2011-02-08 ENCOUNTER — Telehealth: Payer: Self-pay | Admitting: Sports Medicine

## 2011-02-08 NOTE — Telephone Encounter (Signed)
Was here last week and needs a note for her job - please fax to [phone]

## 2011-02-09 NOTE — Telephone Encounter (Signed)
Re-printed and faxed letter

## 2011-03-11 NOTE — Procedures (Signed)
NAMEGlenisha, Haley Wyatt             ACCOUNT NO.:  [id number]   MEDICAL RECORD NO.:  [id number]          PATIENT TYPE:  OUT   LOCATION:  SLEEP CENTER                 FACILITY:  [facility]   PHYSICIAN:  Clinton D. Maple Hudson, M.D. DATE OF BIRTH:  [birth year]   DATE OF STUDY:                              NOCTURNAL POLYSOMNOGRAM   REFERRING PHYSICIAN:  Dr. Kevin Fenton C. Spruill.   INDICATIONS FOR STUDY:  Insomnia with sleep apnea.   EPWORTH SLEEPINESS SCORE:  10/24.   BMI:  1.  Weight 332 pounds.   HOME MEDICATION:  Hydrocodone.   SLEEP ARCHITECTURE:  Total sleep time 315 minutes with sleep efficiency 81%.  Stage I was 5%, stage II 76%, stages III and IV were absent, REM 18% of  total sleep time.  Sleep latency 36 minutes, REM latency 169 minutes, awake  after sleep onset 40 minutes, arousal index 12.6.  No bedtime medication  reported.   RESPIRATORY DATA:  Apnea/hypopnea index (AHI, RDI) 0.4 per hour (normal  range 0 to 5 per hour).  This reflected two obstructive apneas.  Events only  occurred while supine.  REM AHI 2.1 per hour.  There were insufficient  events to qualify for split protocol CPAP titration.   OXYGEN DATA:  Mild snoring with oxygen desaturation to a nadir of 89%.  Mean  oxygen saturation through the study was 96% on room air.   CARDIAC DATA:  Normal sinus rhythm.   MOVEMENT/PARASOMNIAS:  Total of 111 limb jerks were recorded of which eight  were associated with arousal or awakening for periodic limb movement with  arousal index of 1.5 per hour which is probably insignificant.  Bathroom x2.   IMPRESSION/RECOMMENDATIONS:  1.  Unremarkable sleep architecture for the sleep center environment      although total sleep time is  shorter than would be considered normal      for routine at home.  In the absence of specific sleep disturbance she      may respond best to treatment for insomnia.  2.  Occasional sleep disorder breathing events, AHI 0.4 per hour, with no      specific  therapy indicated, normal      oxygenation with mild snoring.  3.  Occasional limb jerks, 1.5 arousals per hour.      Clinton D. Maple Hudson, M.D.  Diplomate, Biomedical engineer of Sleep Medicine  Electronically Signed     CDY/MEDQ  D:  03/05/2006 12:49:17  T:  03/06/2006 11:02:37  Job:  [id number]

## 2011-03-11 NOTE — Op Note (Signed)
Haley Wyatt, Haley Wyatt             ACCOUNT NO.:  [id number]   MEDICAL RECORD NO.:  [id number]          PATIENT TYPE:  AMB   LOCATION:  DSC                          FACILITY:  [facility]   PHYSICIAN:  Mila Homer. Sherlean Foot, M.D. DATE OF BIRTH:  09/30/1976   DATE OF PROCEDURE:  03/08/2006  DATE OF DISCHARGE:                                 OPERATIVE REPORT   SURGEON:  Mila Homer. Sherlean Foot, M.D.   ASSISTANT:  None.   ANESTHESIA:  General plus preoperative interscalene block.   PREOPERATIVE DIAGNOSES:  Left shoulder impingement syndrome and  intraarticular loose body.   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   PROCEDURE:  Left shoulder arthroscopy with loose body removal, subacromial  decompression.   INDICATIONS FOR PROCEDURE:  The patient is a 35 year old black female with  MRI evidence of an intraarticular loose body in the glenohumeral joint as  well as some stenosis of the supraspinatus tendon and an __________.  Informed consent was obtained.   DESCRIPTION OF PROCEDURE:  The patient was taken to the operating room,  administered general anesthesia and then placed in the beach chair position.  Anterior, posterior and dorsolateral portals were created after a sterile  prep and drape. This was done with a #11 blade, blunt trocar and cannula.  Diagnostic arthroscopy of the glenohumeral joint revealed some minor changes  which seemed to have come from a loose piece. This was embedded in the  posterior labrum in the inferior aspect in the axillary recess. I got a good  view of the entire joint, I removed the loose body debriding it with a 3.2  Kuda shaver and then debrided some glenohumeral OA changes on the glenoid in  the anterior and inferior aspect of the glenoid. Biceps tendon, rest of the  labrum and the undersurface of the rotator cuff appeared to be normal. I  then went to the subacromial space from the direct lateral portal and  performed a bursectomy, anterior and lateral acromioplasty and CA ligament  release. I then irrigated and closed with 4-0 nylon sutures, dressed with  Xeroform dressings, sponges. ABDs, 2 inch silk tape and a simple sling.  Complications none. Drains none.           ______________________________  Mila Homer. Sherlean Foot, M.D.     SDL/MEDQ  D:  03/08/2006  T:  03/08/2006  Job:  [id number]

## 2011-03-11 NOTE — Discharge Summary (Signed)
[facility]  Patient:    Haley Wyatt, Haley Wyatt                      MRN: [id number] Adm. Date:  [id number] Disc. Date: [id number] Attending:  Venita Sheffield                           Discharge Summary  HOSPITAL COURSE:              The patient is a 35 year old gravida 7, para 4-0-3-4, who was admitted with severe lower abdominal pain and fever of 104.3, a history in the past of Chlamydia, gonorrhea, Trichomonas.  On admission, her pregnancy test was negative.  The urine showed a small amount of leukocyte esterase.  Her WBC was 15.1, her hemoglobin 13.1.  HIV was negative and her pregnancy test was negative.  She was admitted with a diagnosis of probable PID.  Her urine was ______ positive.  She was treated with Unasyn 3 g IV every six hours and she rapidly defervesced.  She was discharged home on ampicillin 500 mg p.o. q.6h., to see me in two weeks.  DISCHARGE DIAGNOSES:          1. Urinary tract infection.                               2. Chronic pelvic inflammatory disease with                                  acute flare-up. DD:  04/04/01 TD:  04/04/01 Job: [id number]

## 2011-04-05 ENCOUNTER — Encounter: Payer: Self-pay | Admitting: Sports Medicine

## 2011-04-08 ENCOUNTER — Ambulatory Visit: Payer: Medicaid Other | Admitting: Family Medicine

## 2011-04-08 ENCOUNTER — Ambulatory Visit (INDEPENDENT_AMBULATORY_CARE_PROVIDER_SITE_OTHER): Payer: Medicaid Other | Admitting: *Deleted

## 2011-04-08 DIAGNOSIS — Z111 Encounter for screening for respiratory tuberculosis: Secondary | ICD-10-CM

## 2011-04-11 ENCOUNTER — Ambulatory Visit (INDEPENDENT_AMBULATORY_CARE_PROVIDER_SITE_OTHER): Payer: Medicaid Other | Admitting: *Deleted

## 2011-04-11 DIAGNOSIS — Z111 Encounter for screening for respiratory tuberculosis: Secondary | ICD-10-CM

## 2011-04-11 DIAGNOSIS — IMO0001 Reserved for inherently not codable concepts without codable children: Secondary | ICD-10-CM

## 2011-04-11 LAB — TB SKIN TEST
Induration: 0
TB Skin Test: NEGATIVE mm

## 2011-04-11 NOTE — Progress Notes (Signed)
PPD negative-0 mm. 

## 2011-04-15 ENCOUNTER — Ambulatory Visit (INDEPENDENT_AMBULATORY_CARE_PROVIDER_SITE_OTHER): Payer: Medicaid Other | Admitting: Sports Medicine

## 2011-04-15 ENCOUNTER — Other Ambulatory Visit: Payer: Self-pay | Admitting: Sports Medicine

## 2011-04-15 ENCOUNTER — Encounter: Payer: Self-pay | Admitting: Sports Medicine

## 2011-04-15 DIAGNOSIS — L738 Other specified follicular disorders: Secondary | ICD-10-CM

## 2011-04-15 DIAGNOSIS — L308 Other specified dermatitis: Secondary | ICD-10-CM | POA: Insufficient documentation

## 2011-04-15 DIAGNOSIS — M2140 Flat foot [pes planus] (acquired), unspecified foot: Secondary | ICD-10-CM

## 2011-04-15 MED ORDER — TRIAMCINOLONE ACETONIDE 0.5 % EX OINT: TOPICAL_OINTMENT | Freq: Two times a day (BID) | CUTANEOUS | Status: AC

## 2011-04-15 MED ORDER — RANITIDINE HCL 300 MG PO TABS: 300.0000 mg | ORAL_TABLET | Freq: Two times a day (BID) | ORAL | Status: DC

## 2011-04-15 NOTE — Assessment & Plan Note (Signed)
I suspect that she may be getting some traction across the common digital nerve between 2nd and 3rd toes. She has collapse of the trans arch. I suspect that a metatarsal cookie to recreate the arch will help. She will run to [facility] to have this placed. F/u at [facility] prn.

## 2011-04-15 NOTE — Patient Instructions (Addendum)
Great to see you. Run down to the sports medicine center to have a left metatarsal cookie/pad placed to recreate your transverse arch on your left foot. Also use the ointment I have called in. Come back to see [place] as needed!    [name]. Benjamin Stain, M.D. [facility] [address], [place] [address] [phone]

## 2011-04-15 NOTE — Assessment & Plan Note (Signed)
Triamcinolone 0.5% oint BID topical to area of hyperpigmentation.

## 2011-04-15 NOTE — Progress Notes (Signed)
  Subjective:    Patient ID: Haley Wyatt, female    DOB: [birth year], 35 y.o.   MRN: [id number]  HPI L shin rash:  She has had some post-inflammatory hyperpigmentation for months now.  No pain.  Would like this treated.  She has certainly had some trauma from shaving/ingrown hairs in the past that have started this.  No itch, no pain.  L toe numbness:  Present for around a month now.  Left 2nd and 3rd toes have a vague tingly feeling on the dorsal aspect.  No trauma.  No palliating/precipitating factors.  No pain.  No fevers/chills.  No swelling. No trauma to upper leg or pain in ant or post leg while walking.  No back pain.     Review of Systems    See HPI Objective:   Physical Exam  Constitutional: She appears well-developed and well-nourished.  Musculoskeletal:       L Ankle: No visible erythema or swelling. Range of motion is full in all directions. Strength is 5/5 in all directions. Stable lateral and medial ligaments; squeeze test and kleiger test unremarkable;  Talar dome nontender; No pain at base of 5th MT; No tenderness over cuboid; No tenderness over N spot or navicular prominence No tenderness on posterior aspects of lateral and medial malleolus No sign of peroneal tendon subluxations or tenderness to palpation Negative tarsal tunnel tinel's Able to walk 4 steps. She does have breakdown of the transverse arch with mild clawing of the toes.  There is significant callous present.  Long arch is maintained. Sensation is grossly intact however she does have paresthesias when touched over dorsal 2nd and 3rd toes.  No pain with compression of 2nd 3rd or 4th webspaces to suggest mortons.    Skin: Skin is warm and dry.       3-4 cm area of hyperpigmentation and xerosis over mid ant/lat left shin.  Non-tender. This has the appearance of asteatotic eczema.          Assessment & Plan:

## 2011-08-02 ENCOUNTER — Inpatient Hospital Stay (INDEPENDENT_AMBULATORY_CARE_PROVIDER_SITE_OTHER)
Admission: RE | Admit: 2011-08-02 | Discharge: 2011-08-02 | Disposition: A | Discharge status: Home / Self Care | Payer: Self-pay | Source: Ambulatory Visit | Attending: Family Medicine | Admitting: Family Medicine

## 2011-08-02 DIAGNOSIS — R509 Fever, unspecified: Secondary | ICD-10-CM

## 2011-08-04 ENCOUNTER — Ambulatory Visit (INDEPENDENT_AMBULATORY_CARE_PROVIDER_SITE_OTHER): Payer: Self-pay | Admitting: Family Medicine

## 2011-08-04 ENCOUNTER — Encounter: Payer: Self-pay | Admitting: Family Medicine

## 2011-08-04 VITALS — BP 140/86 | HR 92 | Temp 98.8°F | Wt 375.0 lb

## 2011-08-04 DIAGNOSIS — J029 Acute pharyngitis, unspecified: Secondary | ICD-10-CM

## 2011-08-04 NOTE — Assessment & Plan Note (Signed)
Likely due to her respiratory virus. Discussed symptomatic control with Tylenol and ibuprofen. Handout given. Red flags reviewed with patient who expresses understand

## 2011-08-04 NOTE — Patient Instructions (Signed)
Thank you for coming in today. I think you have a cold.   Common Cold, Adult An upper respiratory tract infection, or cold, is a viral infection of the air passages to the lung. Colds are contagious, especially during the first 3 or 4 days. Antibiotics cannot cure a cold. Cold germs are spread by coughs, sneezes, and hand to hand contact. A respiratory tract infection usually clears up in a few days, but some people may be sick for a week or two. HOME CARE INSTRUCTIONS  Only take over-the-counter or prescription medicines for pain, discomfort, or fever as directed by your caregiver.   Be careful not to blow your nose too hard. This may cause a nosebleed.   Use a cool-mist humidifier (vaporizer) to increase air moisture. This will make it easier for you to breath. Do not use hot steam.   Rest as much as possible and get plenty of sleep.   Wash your hands often, especially after you blow your nose. Cover your mouth and nose with a tissue when you sneeze or cough.   Drink at least 8 glasses of clear liquids every day, such as water, fruit juices, tea, clear soups, and carbonated beverages.  SEEK MEDICAL CARE IF:  An oral temperature above 102 lasts 4 days or more, and is not controlled by medication.   You have a sore throat that gets worse or you see white or yellow spots in your throat.   Your cough gets worse or lasts more than 10 days.   You have a rash somewhere on your skin. You have large and tender lumps in your neck.   You have an earache or a headache.   You have thick, greenish or yellowish discharge from your nose.   You cough-up thick yellow, green, gray or bloody mucus (secretions).  SEEK IMMEDIATE MEDICAL CARE IF: You have trouble breathing, chest pain, or your skin or nails look gray or blue. MAKE SURE YOU:    Understand these instructions.   Will watch your condition.   Will get help right away if you are not doing well or get worse.  Document Released:  10/07/2000 Document Re-Released: 09/22/2008 [facility] Patient Information 2011 [place], [place].

## 2011-08-04 NOTE — Progress Notes (Signed)
Haley Wyatt presents to clinic with a sore throat and headache. She was seen in urgent care on Tuesday with a fever that she notes was 105. She was given ibuprofen which reduced her fever additionally a Monospot was obtained and found to be negative. She continues to have a sore throat headache and comes to clinic today.  She denies any cough or congestion in her lungs. He does note nasal congestion. She states that she is feeling better and no longer has a fever.  PMH reviewed.  ROS as above otherwise neg Medications reviewed.  Exam:  BP 140/86  Pulse 92  Temp(Src) 98.8 F (37.1 C) (Oral)  Wt 375 lb (170.099 kg)  SpO2 98%  LMP 08/02/2011 Gen: Well NAD, obese HEENT: EOMI,  MMM, nasal congestion. Posterior pharyngeal erythema no exudate Lungs: CTABL Nl WOB Heart: RRR no MRG Abd: NABS, NT, ND Exts: Non edematous BL  LE, warm and well perfused.   Rapid strep negative

## 2011-10-21 ENCOUNTER — Encounter (HOSPITAL_COMMUNITY): Payer: Self-pay | Admitting: Emergency Medicine

## 2011-10-21 ENCOUNTER — Emergency Department (INDEPENDENT_AMBULATORY_CARE_PROVIDER_SITE_OTHER)
Admission: EM | Admit: 2011-10-21 | Discharge: 2011-10-21 | Disposition: A | Discharge status: Home / Self Care | Payer: Medicaid Other | Source: Home / Self Care | Attending: Emergency Medicine | Admitting: Emergency Medicine

## 2011-10-21 DIAGNOSIS — M25519 Pain in unspecified shoulder: Secondary | ICD-10-CM

## 2011-10-21 DIAGNOSIS — H9203 Otalgia, bilateral: Secondary | ICD-10-CM

## 2011-10-21 DIAGNOSIS — M25512 Pain in left shoulder: Secondary | ICD-10-CM

## 2011-10-21 DIAGNOSIS — H9209 Otalgia, unspecified ear: Secondary | ICD-10-CM

## 2011-10-21 MED ORDER — AMOXICILLIN 500 MG PO CAPS: 500.0000 mg | ORAL_CAPSULE | Freq: Three times a day (TID) | ORAL | Status: AC

## 2011-10-21 NOTE — ED Provider Notes (Signed)
[name] is a 35 year old woman with bilateral ear pain and decreased hearing. 4 weeks ago she was diagnosed with an upper respiratory tract infection and she developed a second sickening one week ago. She initially noted facial pain and discharge and generally felt unwell. She denies any fevers or difficulty breathing or vomiting. She has tried over-the-counter cold medicines that have not helped much.  Additionally she complains of 2 days of left shoulder pain without injury. She notes that in the past she has had rotator cuff surgery of the left shoulder before. She notes pain with repeat reaching up and back. She denies any hand numbness or weakness. She denies any neck pain or radiculopathy.  PMH reviewed.  ROS as above otherwise neg Medications reviewed. No current facility-administered medications for this encounter.   Current Outpatient Prescriptions  Medication Sig Dispense Refill  . amoxicillin (AMOXIL) 500 MG capsule Take 1 capsule (500 mg total) by mouth 3 (three) times daily.  21 capsule  0  . HYDROcodone-acetaminophen (VICODIN) 5-500 MG per tablet Take 1 tablet by mouth every 4 (four) hours as needed for pain.  60 tablet  0  . lisinopril-hydrochlorothiazide (PRINZIDE,ZESTORETIC) 20-12.5 MG per tablet Take 1 tablet by mouth daily.        . ranitidine (ZANTAC) 300 MG tablet Take 1 tablet (300 mg total) by mouth 2 (two) times daily.  60 tablet  1  . simethicone (MYLICON) 125 MG chewable tablet Chew 125 mg by mouth as needed. Use as needed for gas       . triamcinolone (KENALOG) 0.5 % ointment Apply topically 2 (two) times daily. To affected area on left shin.  30 g  0    Exam:  BP 143/85  Pulse 93  Temp(Src) 99.1 F (37.3 C) (Oral)  Resp 20  SpO2 100%  LMP 09/30/2011 Gen: Well NAD, morbidly obese HEENT: EOMI,  MMM, mild posterior pharyngeal erythema. Tympanic membranes bilaterally have clear a fusion. No facial pain to percussion. Lungs: CTABL Nl WOB Heart: RRR no  MRG Abd: NABS, NT, ND Exts: Non edematous BL  LE, warm and well perfused.  Musculoskeletal: Left shoulder decreased range to abduction and forward flexion due to pain decreased external rotation due to pain normal internal rotation. Hawkin's is negative Neer stent empty can are positive. Shoulder strength is normal. Hand is neurovascularly intact. Normal neck range of motion  Assessment and plan: 35 year old woman with otitis media and rotator cuff injury versus subacromial bursitis. #1 otitis media likely second sickening plan to treat with amoxicillin 3 times a day for one week and followup with primary care provider in one to 2 weeks. Additionally recommend followup for elevated blood pressure. #2 left shoulder injury likely subacromial bursitis versus mild rotator cuff injury. I recommend she follow up with Dr. Karie Schwalbe. [facility] if that was her former primary care provider. She will likely benefit from subacromial injection.  I discussed these findings with the patient who expresses understanding.     Clementeen Graham 10/21/11 1810

## 2011-10-21 NOTE — ED Notes (Signed)
Bilateral ear pain for 3-4 days. Left shoulder pain for several days.

## 2011-10-21 NOTE — ED Provider Notes (Signed)
Medical screening examination/treatment/procedure(s) were performed by a resident physician and as supervising physician I was immediately available for consultation/collaboration.  [name]   [name] [name], MD 10/21/11 2139 

## 2011-10-27 ENCOUNTER — Ambulatory Visit: Payer: Self-pay | Admitting: Family Medicine

## 2011-10-28 ENCOUNTER — Encounter: Payer: Self-pay | Admitting: Family Medicine

## 2011-10-28 ENCOUNTER — Ambulatory Visit (HOSPITAL_BASED_OUTPATIENT_CLINIC_OR_DEPARTMENT_OTHER)
Admission: RE | Admit: 2011-10-28 | Discharge: 2011-10-28 | Disposition: A | Discharge status: Home / Self Care | Payer: Medicaid Other | Source: Ambulatory Visit | Attending: Family Medicine | Admitting: Family Medicine

## 2011-10-28 ENCOUNTER — Ambulatory Visit (INDEPENDENT_AMBULATORY_CARE_PROVIDER_SITE_OTHER): Payer: Self-pay | Admitting: Family Medicine

## 2011-10-28 VITALS — BP 140/86 | HR 96 | Temp 98.5°F | Ht 70.0 in | Wt 360.0 lb

## 2011-10-28 DIAGNOSIS — M25512 Pain in left shoulder: Secondary | ICD-10-CM

## 2011-10-28 DIAGNOSIS — M25519 Pain in unspecified shoulder: Secondary | ICD-10-CM

## 2011-10-28 DIAGNOSIS — M19019 Primary osteoarthritis, unspecified shoulder: Secondary | ICD-10-CM

## 2011-10-28 DIAGNOSIS — M24019 Loose body in unspecified shoulder: Secondary | ICD-10-CM | POA: Insufficient documentation

## 2011-10-28 NOTE — Assessment & Plan Note (Signed)
Patient's history and exam consistent with rotator cuff impingement, ? Developing frozen shoulder.  She has had a prior loose body removal of this shoulder likely back in 2007 based on imaging though didn't have injury prior to this.  On x-rays again has what's read as a calcified loose body in anterior medial aspect of joint - this likely did not appear in past 1-2 weeks when patient has been symptomatic though and she denies catching/locking of shoulder.  Most of pain is with abduction, reaching activities - location would suggest more symptoms with subscapularis if this was the cause of her pain.  Will try to treat conservatively for rotator cuff impingement.  Shown codman exercises as well as theraband strengthening exercises (start the latter in about 1 week) to do most days of the week.  Also given subacromial injection today.  If not improving after a month would consider further imaging and/or combination glenohumeral/subacromial injection.  After informed written consent, patient was seated on exam table. Left shoulder was prepped with alcohol swab and utilizing posterior approach, patient's left subacromial space was injected with 3:1 marcaine: depomedrol. Patient tolerated the procedure well without immediate complications.

## 2011-10-28 NOTE — Patient Instructions (Signed)
You have rotator cuff impingement Try to avoid painful activities (overhead activities, lifting with extended arm) as much as possible. Aleve and/or tylenol as needed for pain Subacromial injection may be beneficial to help with pain and to decrease inflammation. Home exercise program as discussed with theraband and scapular stabilization exercises - these are very important for long term relief even if an injection was given. If not improving at follow-up we will consider ultrasound and/or physical therapy.

## 2011-10-28 NOTE — Progress Notes (Signed)
Subjective:    Patient ID: Haley Wyatt, female    DOB: 01/29/1976, 36 y.o.   MRN: [id number]  PCP: Rivka Safer  HPI 36 yo F here for left shoulder pain.  Patient denies known injury. She reports that remotely she had seen Dr. Sherlean Foot for insidious shoulder pain in the past - told she had a loose body in shoulder (no prior injury then either) that was removed and then sounds like he did an acromioplasty.  Some improvement following this. She is right handed. Over past 1-2 weeks pain has started again in lateral left shoulder. + night pain, constant pain. No catching/locking. Pain worse with overhead and lateral motions. Tried OTC nsaids. Has never had cortisone injection. Went to urgent care and told she may benefit from subacromial injection for impingement/bursitis.  Past Medical History  Diagnosis Date  . Hypertension     Current Outpatient Prescriptions on File Prior to Visit  Medication Sig Dispense Refill  . amoxicillin (AMOXIL) 500 MG capsule Take 1 capsule (500 mg total) by mouth 3 (three) times daily.  21 capsule  0  . lisinopril-hydrochlorothiazide (PRINZIDE,ZESTORETIC) 20-12.5 MG per tablet Take 1 tablet by mouth daily.        . ranitidine (ZANTAC) 300 MG tablet Take 1 tablet (300 mg total) by mouth 2 (two) times daily.  60 tablet  1  . simethicone (MYLICON) 125 MG chewable tablet Chew 125 mg by mouth as needed. Use as needed for gas       . triamcinolone (KENALOG) 0.5 % ointment Apply topically 2 (two) times daily. To affected area on left shin.  30 g  0    Past Surgical History  Procedure Date  . Shoulder surgery     left shoulder loose body removal  . Carpal tunnel release     No Known Allergies  History   Social History  . Marital Status: Single    Spouse Name: N/A    Number of Children: N/A  . Years of Education: N/A   Occupational History  . Not on file.   Social History Main Topics  . Smoking status: Former Smoker    Types: Cigarettes  .  Smokeless tobacco: Not on file  . Alcohol Use: Not on file  . Drug Use: Not on file  . Sexually Active: Not on file   Other Topics Concern  . Not on file   Social History Narrative  . No narrative on file    Family History  Problem Relation Age of Onset  . Hyperlipidemia Father   . Hypertension Father   . Sudden death Neg Hx   . Diabetes Neg Hx   . Heart attack Neg Hx     BP 140/86  Pulse 96  Temp(Src) 98.5 F (36.9 C) (Oral)  Ht 5\' 10"  (1.778 m)  Wt 360 lb (163.295 kg)  BMI 51.65 kg/m2  LMP 09/30/2011  Review of Systems See HPI above.    Objective:   Physical Exam Gen: NAD L shoulder: No swelling, ecchymoses.  No gross deformity. No TTP at [facility] joint or biceps tendon. Lacks 15 degrees ER on left compared to right.  Active flexion and abduction to 110 degrees. Positive Hawkins, Neers. Negative Yergasons. Strength 5/5 with empty can and resisted internal/external rotation.  Pain with empty can. NV intact distally.  R shoulder: FROM without pain or weakness.    Assessment & Plan:  1. Left shoulder pain - Patient's history and exam consistent with rotator cuff impingement, ? Developing  frozen shoulder.  She has had a prior loose body removal of this shoulder likely back in 2007 based on imaging though didn't have injury prior to this.  On x-rays again has what's read as a calcified loose body in anterior medial aspect of joint - this likely did not appear in past 1-2 weeks when patient has been symptomatic though and she denies catching/locking of shoulder.  Most of pain is with abduction, reaching activities - location would suggest more symptoms with subscapularis if this was the cause of her pain.  Will try to treat conservatively for rotator cuff impingement.  Shown codman exercises as well as theraband strengthening exercises (start the latter in about 1 week) to do most days of the week.  Also given subacromial injection today.  If not improving after a month would  consider further imaging and/or combination glenohumeral/subacromial injection.  After informed written consent, patient was seated on exam table. Left shoulder was prepped with alcohol swab and utilizing posterior approach, patient's left subacromial space was injected with 3:1 marcaine: depomedrol. Patient tolerated the procedure well without immediate complications.

## 2011-11-21 ENCOUNTER — Ambulatory Visit (INDEPENDENT_AMBULATORY_CARE_PROVIDER_SITE_OTHER): Payer: Self-pay | Admitting: Family Medicine

## 2011-11-21 ENCOUNTER — Encounter: Payer: Self-pay | Admitting: Family Medicine

## 2011-11-21 VITALS — BP 135/91 | HR 85 | Temp 98.1°F | Ht 70.0 in | Wt 380.9 lb

## 2011-11-21 DIAGNOSIS — H9313 Tinnitus, bilateral: Secondary | ICD-10-CM | POA: Insufficient documentation

## 2011-11-21 DIAGNOSIS — H9319 Tinnitus, unspecified ear: Secondary | ICD-10-CM

## 2011-11-21 NOTE — Patient Instructions (Signed)
I would like you to try some Sudafed 3 times a day for the next week. I would like you to be seen again in one week to recheck your hearing. If you get worse over the week, please come back and be seen sooner.

## 2011-11-21 NOTE — Assessment & Plan Note (Signed)
See back in one week to recheck hearing. We'll ask her to use Sudafed for one week. Gave red flags return.

## 2011-11-21 NOTE — Progress Notes (Signed)
  Subjective:    Patient ID: Haley Wyatt, female    DOB: 1976/04/20, 36 y.o.   MRN: [id number]  HPI Patient here today complaining of a feeling of pressure in the ears. She states that her hearing is bad and it feels like she is under water. Initially she said it felt like ringing, however when asked to qualify this ringing, she said it felt more like being under water and distant from the voices. She says that her cell phone and a TV makes it worse. She's not had any cough congestion or fever. She has no headache. She said that the sensation is in both ears the same. She states she is also having pain and pressure in her ears. The symptoms started yesterday. She has not tried any medication for this.   Review of Systems Denies CP, SOB, HA, N/V/D, fever     Objective:   Physical Exam Vital signs reviewed General appearance - alert, well appearing, and in no distress and oriented to person, place, and time Eyes - pupils equal and reactive, extraocular eye movements intact, sclera anicteric Ears - bilateral TM's are bulging, but without redness or air fluid level external ear canals normal, right ear normal, left ear normal Nose - normal and patent, no erythema, discharge or polyps Throat-not erythematous        Assessment & Plan:

## 2011-12-06 ENCOUNTER — Encounter: Payer: Self-pay | Admitting: Sports Medicine

## 2011-12-06 ENCOUNTER — Ambulatory Visit (INDEPENDENT_AMBULATORY_CARE_PROVIDER_SITE_OTHER): Payer: Medicaid Other | Admitting: Sports Medicine

## 2011-12-06 VITALS — BP 140/75 | HR 81

## 2011-12-06 DIAGNOSIS — M25512 Pain in left shoulder: Secondary | ICD-10-CM

## 2011-12-06 DIAGNOSIS — M25519 Pain in unspecified shoulder: Secondary | ICD-10-CM

## 2011-12-06 NOTE — Assessment & Plan Note (Addendum)
GH DJD. Intra-articular loose body likely related to a labral injury seen on MRI some time ago. Injection as above. Oral analgesics prn. RTC 2-3 weeks. If no better, to arthroscopy.

## 2011-12-06 NOTE — Progress Notes (Signed)
  Subjective:    Patient ID: Haley Wyatt, female    DOB: 1976-03-12, 36 y.o.   MRN: [id number]  HPI Left shoulder pain for many years now.  She's had an MRI in 2007 that showed an intra-articular loose body, she had a shoulder arthroscopy by Dr. Sherlean Foot, he supposedly removed the intra-articular loose body per the patient.  She was recently seen at [facility] sports medicine, x-ray again show the intra-articular loose body. She had a subacromial injection that provided approximately 2 weeks of benefit.  Currently she is having a lot of pain that she localizes deep in the shoulder joint. The shoulder is okay, and then intermittently she'll have a sharp pain.   Review of Systems    No fevers, chills, night sweats, weight loss, chest pain, or shortness of breath.  Social History: Non-smoker. Objective:   Physical Exam General:  Well developed, well nourished, and in no acute distress. Neuro:  Alert and oriented x3, extra-ocular muscles intact. Skin: Warm and dry, no rashes noted. Respiratory:  Not using accessory muscles, speaking in full sentences. Musculoskeletal: Left Shoulder: Inspection reveals no abnormalities, atrophy or asymmetry. Palpation is normal with no tenderness over AC joint or bicipital groove. ROM is full in all planes. Rotator cuff strength normal throughout. No signs of impingement with negative Neer and Hawkin's tests, empty can sign. Speeds and Yergason's tests normal. No labral pathology noted with negative Obrien's, negative clunk and good stability. Normal scapular function observed. No painful arc and no drop arm sign. No apprehension sign  Real-time Ultrasound Guided Injection of: Left glenohumeral joint Consent obtained. Time-out conducted. Noted no overlying erythema, induration, or other signs of local infection. Skin prepped in a sterile fashion. Local anesthesia: Topical ethyl chloride With sterile technique and under real time ultrasound  guidance: Spinal needle inserted into glenohumeral joint, 1 cc Depo-Medrol, 4 cc lidocaine injected into the joint under real time ultrasound guidance. Completed without difficulty Pain immediately resolved suggesting accurate placement of the medication. Advised to call if fevers/chills, erythema, induration, drainage, or persistent bleeding. Images saved.     Assessment & Plan:

## 2011-12-06 NOTE — Patient Instructions (Signed)
Great to see you Haley Wyatt, Injection in your glenohumeral joint. Do home exercises. Come back to see me in 2-3 weeks.    [name]. Benjamin Stain, M.D. [facility] [address] [place] [address] [phone]

## 2011-12-27 ENCOUNTER — Ambulatory Visit: Payer: Medicaid Other | Admitting: Sports Medicine

## 2012-01-30 ENCOUNTER — Other Ambulatory Visit: Payer: Self-pay | Admitting: Family Medicine

## 2012-01-30 MED ORDER — LISINOPRIL-HYDROCHLOROTHIAZIDE 20-12.5 MG PO TABS: 1.0000 | ORAL_TABLET | Freq: Every day | ORAL | Status: DC

## 2012-02-06 ENCOUNTER — Ambulatory Visit: Payer: Medicaid Other | Admitting: Family Medicine

## 2012-05-23 ENCOUNTER — Encounter: Payer: Self-pay | Admitting: Family Medicine

## 2012-05-23 ENCOUNTER — Encounter: Payer: Medicaid Other | Admitting: Sports Medicine

## 2012-05-23 ENCOUNTER — Other Ambulatory Visit (HOSPITAL_COMMUNITY)
Admission: RE | Admit: 2012-05-23 | Discharge: 2012-05-23 | Disposition: A | Discharge status: Home / Self Care | Payer: Medicaid Other | Source: Ambulatory Visit | Attending: Family Medicine | Admitting: Family Medicine

## 2012-05-23 ENCOUNTER — Ambulatory Visit (INDEPENDENT_AMBULATORY_CARE_PROVIDER_SITE_OTHER): Payer: Medicaid Other | Admitting: Family Medicine

## 2012-05-23 VITALS — BP 154/94 | HR 88 | Ht 69.0 in | Wt 392.0 lb

## 2012-05-23 DIAGNOSIS — I10 Essential (primary) hypertension: Secondary | ICD-10-CM

## 2012-05-23 DIAGNOSIS — N920 Excessive and frequent menstruation with regular cycle: Secondary | ICD-10-CM

## 2012-05-23 DIAGNOSIS — N760 Acute vaginitis: Secondary | ICD-10-CM

## 2012-05-23 DIAGNOSIS — Z01419 Encounter for gynecological examination (general) (routine) without abnormal findings: Secondary | ICD-10-CM

## 2012-05-23 DIAGNOSIS — N898 Other specified noninflammatory disorders of vagina: Secondary | ICD-10-CM

## 2012-05-23 DIAGNOSIS — Z7251 High risk heterosexual behavior: Secondary | ICD-10-CM

## 2012-05-23 DIAGNOSIS — A499 Bacterial infection, unspecified: Secondary | ICD-10-CM

## 2012-05-23 DIAGNOSIS — B9689 Other specified bacterial agents as the cause of diseases classified elsewhere: Secondary | ICD-10-CM | POA: Insufficient documentation

## 2012-05-23 DIAGNOSIS — E669 Obesity, unspecified: Secondary | ICD-10-CM

## 2012-05-23 DIAGNOSIS — Z113 Encounter for screening for infections with a predominantly sexual mode of transmission: Secondary | ICD-10-CM | POA: Insufficient documentation

## 2012-05-23 LAB — POCT WET PREP (WET MOUNT): Clue Cells Wet Prep Whiff POC: POSITIVE

## 2012-05-23 MED ORDER — LISINOPRIL-HYDROCHLOROTHIAZIDE 20-12.5 MG PO TABS: 2.0000 | ORAL_TABLET | Freq: Every day | ORAL | Status: DC

## 2012-05-23 MED ORDER — METRONIDAZOLE 500 MG PO TABS: 500.0000 mg | ORAL_TABLET | Freq: Two times a day (BID) | ORAL | Status: AC

## 2012-05-23 NOTE — Assessment & Plan Note (Signed)
Heavy periods, seems to be ovulatory.  Advised hormonal contraception. She would like to consider mirena.  Will check TSH, CBC.  If not responsive, would consider [place] to eval for presence of fibroids.

## 2012-05-23 NOTE — Addendum Note (Signed)
Addended by: Macy Mis on: 05/23/2012 05:26 PM   Modules accepted: Orders

## 2012-05-23 NOTE — Assessment & Plan Note (Signed)
Poorly controlled today.  Will increase ACE/HCTZ combo.  Will follow-up in 7 days for fasting labs and recheck cr/k.  Will f.u with PCP

## 2012-05-23 NOTE — Patient Instructions (Addendum)
Make appt for fasting blood work in 7-10 days   Will increase lisinopril-HCTZ to 2 tabs daily  Make follow-up with your PCP to discuss other concerns and follow-up on lab results

## 2012-05-23 NOTE — Progress Notes (Signed)
  Subjective:    Patient ID: Haley Wyatt, female    DOB: 07-30-1976, 36 y.o.   MRN: [id number]  HPI  Annual Gynecological Exam   Wt Readings from Last 3 Encounters:  05/23/12 392 lb (177.81 kg)  11/21/11 380 lb 14.4 oz (172.775 kg)  10/28/11 360 lb (163.295 kg)   Last period:  monthly Regular periods: yes7-9 days Heavy bleeding: yes  Sexually active: yes Birth control or hormonal therapy: none, tubal ligation Hx of STD: Patient desires STD screening Dyspareunia: No Vaginal discharge: several weeks Dysuria:No   Last mammogram:none Breast mass or concerns: No Last Pap: normal 2011,  History of abnormal pap: remote, reports more than 2 normal  FH of breast, uterine, ovarian, colon cancer: No    Review of Systems GEN: Alert & Oriented, No acute distress CV:  Regular Rate & Rhythm, no murmur Respiratory:  Normal work of breathing, CTAB Abd:  + BS, soft, no tenderness to palpation Ext: no pre-tibial edema        Objective:   Physical Exam GEN: Alert & Oriented, No acute distress, morbidly obese. CV:  Regular Rate & Rhythm, no murmur Respiratory:  Normal work of breathing, CTAB Abd:  + BS, soft, no tenderness to palpation Ext: no pre-tibial edema Pelvic Exam:        External: normal female genitalia without lesions or masses        Vagina: normal without lesions or masses        Cervix: normal without lesions or masses        Adnexa: normal bimanual exam without masses or fullness        Uterus: normal by palpation        Samples for Wet prep, GC/Chlamydia obtained        Assessment & Plan:  Declines TDAP today

## 2012-05-24 ENCOUNTER — Telehealth: Payer: Self-pay | Admitting: *Deleted

## 2012-05-24 NOTE — Telephone Encounter (Signed)
LVM for patient to call back to inform her of below 

## 2012-05-24 NOTE — Telephone Encounter (Signed)
Message given

## 2012-05-24 NOTE — Telephone Encounter (Signed)
Message copied by Farrell Ours on Thu May 24, 2012 10:40 AM ------      Message from: Macy Mis      Created: Wed May 23, 2012  5:26 PM       Please call and  Tell pt has BV, i sent in metronidazole

## 2012-05-25 ENCOUNTER — Encounter: Payer: Self-pay | Admitting: Family Medicine

## 2012-08-14 ENCOUNTER — Ambulatory Visit: Payer: Self-pay | Admitting: Emergency Medicine

## 2012-08-14 VITALS — BP 130/85 | HR 111 | Temp 98.5°F | Resp 18 | Ht 68.0 in | Wt 394.0 lb

## 2012-08-14 DIAGNOSIS — J018 Other acute sinusitis: Secondary | ICD-10-CM

## 2012-08-14 DIAGNOSIS — J40 Bronchitis, not specified as acute or chronic: Secondary | ICD-10-CM

## 2012-08-14 MED ORDER — AMOXICILLIN-POT CLAVULANATE 875-125 MG PO TABS: 1.0000 | ORAL_TABLET | Freq: Two times a day (BID) | ORAL | Status: DC

## 2012-08-14 MED ORDER — HYDROCOD POLST-CHLORPHEN POLST 10-8 MG/5ML PO LQCR
5.0000 mL | Freq: Two times a day (BID) | ORAL | Status: DC | PRN
Start: 2012-08-14 — End: 2013-01-28

## 2012-08-14 MED ORDER — PSEUDOEPHEDRINE-GUAIFENESIN ER 60-600 MG PO TB12: 1.0000 | ORAL_TABLET | Freq: Two times a day (BID) | ORAL | Status: DC

## 2012-08-14 NOTE — Progress Notes (Signed)
[facility] [address] [place] [address] [phone]  Date:  08/14/2012   Name:  Haley Wyatt   DOB:  [birth year]   MRN:  [id number]  PCP:  Gaspar Bidding, DO    Chief Complaint: Shortness of Breath, Headache, Fatigue and Cough   History of Present Illness:  Haley Wyatt is a 36 y.o. very pleasant female patient who presents with the following:  2 week history of green post nasal drainage and green purulent sputum with her cough.  Pressure and pain in her cheeks and forehead. Had a fever over the weekend that has since resolved.  Denies any wheezing or shortness of breath, nausea or vomiting.  Denies improvement with OTC medication.  Cough is worse at night.  Pressure in cheeks is worse with cough and sneezing and bending over at the waist.  Exertional shortness of breath  Patient Active Problem List  Diagnosis  . HYPOTHYROIDISM  . OBESITY  . DEPRESSION  . HYPERTENSION  . GERD  . CHONDROMALACIA PATELLA, BILATERAL  . LEG EDEMA, BILATERAL  . Groin pain  . Asteatotic eczema  . Transverse arch collapse of left foot.  . Left shoulder pain  . Tinnitus of both ears  . Menorrhagia  . Bacterial vaginosis    Past Medical History  Diagnosis Date  . Hypertension     Past Surgical History  Procedure Date  . Shoulder surgery     left shoulder loose body removal  . Carpal tunnel release     History  Substance Use Topics  . Smoking status: Former Smoker    Types: Cigarettes  . Smokeless tobacco: Not on file  . Alcohol Use: Not on file    Family History  Problem Relation Age of Onset  . Hyperlipidemia Father   . Hypertension Father   . Sudden death Neg Hx   . Diabetes Neg Hx   . Heart attack Neg Hx     No Known Allergies  Medication list has been reviewed and updated.  Current Outpatient Prescriptions on File Prior to Visit  Medication Sig Dispense Refill  . lisinopril-hydrochlorothiazide (PRINZIDE,ZESTORETIC) 20-12.5 MG per  tablet Take 2 tablets by mouth daily.  60 tablet  0  . ranitidine (ZANTAC) 300 MG tablet Take 1 tablet (300 mg total) by mouth 2 (two) times daily.  60 tablet  1  . simethicone (MYLICON) 125 MG chewable tablet Chew 125 mg by mouth as needed. Use as needed for gas         Review of Systems:  As per HPI, otherwise negative.    Physical Examination: Filed Vitals:   08/14/12 1937  BP: 130/85  Pulse: 111  Temp: 98.5 F (36.9 C)  Resp: 18   Filed Vitals:   08/14/12 1937  Height: 5\' 8"  (1.727 m)  Weight: 394 lb (178.717 kg)   Body mass index is 59.91 kg/(m^2). Ideal Body Weight: Weight in (lb) to have BMI = 25: 164.1   GEN: morbidly obese , NAD, Non-toxic, A & O x 3  No rash, sepsis or shortness of breath HEENT: Atraumatic, Normocephalic. Neck supple. No masses, No LAD.  Oropharynx negative Ears and Nose: No external deformity.  TM negative CV: RRR, No M/G/R. No JVD. No thrill. No extra heart sounds. PULM: CTA B, no wheezes, crackles, rhonchi. No retractions. No resp. distress. No accessory muscle use.  Poor air movement ABD: S, NT, ND, +BS. No rebound. No HSM. EXTR: No c/c/e NEURO Normal gait.  PSYCH: Normally interactive. Conversant. Not depressed or anxious appearing.  Calm demeanor.    Assessment and Plan: Bronchitis Sinusitis augmentin mucinex d tussionex Follow up as needed  Carmelina Dane, MD

## 2012-08-31 ENCOUNTER — Other Ambulatory Visit: Payer: Self-pay | Admitting: Family Medicine

## 2013-01-28 ENCOUNTER — Ambulatory Visit (INDEPENDENT_AMBULATORY_CARE_PROVIDER_SITE_OTHER): Payer: BC Managed Care – PPO | Admitting: Family Medicine

## 2013-01-28 ENCOUNTER — Encounter: Payer: Self-pay | Admitting: Family Medicine

## 2013-01-28 DIAGNOSIS — I10 Essential (primary) hypertension: Secondary | ICD-10-CM

## 2013-01-28 DIAGNOSIS — N920 Excessive and frequent menstruation with regular cycle: Secondary | ICD-10-CM

## 2013-01-28 DIAGNOSIS — E669 Obesity, unspecified: Secondary | ICD-10-CM

## 2013-01-28 NOTE — Assessment & Plan Note (Signed)
will check fasting labs, and refer for medical nutrition therapy.  Patient reports previous success with exercise and nturion.  Discussed at follow-up in 1-2 months would discuss progress, and possible bariatric referral

## 2013-01-28 NOTE — Patient Instructions (Addendum)
Please schedule apopintment with Dr. Gerilyn Pilgrim, nutritionist.  Make a separate appointment for [facility] INSERTION  Schedule lab appointment for fasting labwork  Let's follow-up in 1-2 months to see how you are doing with exercise and nutrition.  We should talk about all of your options including referral to a bariatric center to discuss surgical options

## 2013-01-28 NOTE — Assessment & Plan Note (Signed)
Will check fasting labs, doing ok to lisinoprol-hctz.  Will refer for nutrition therapy

## 2013-01-28 NOTE — Assessment & Plan Note (Signed)
Ovulatory, menorrhagia.  Will check CBC, TSH, if normal, would consider MIRENA IUD. Asked patient to return to schedule procedure appointment as desired (Has BTL for contraception)

## 2013-01-28 NOTE — Progress Notes (Signed)
  Subjective:    Patient ID: Haley Wyatt, female    DOB: Mar 26, 1976, 37 y.o.   MRN: [id number]  HPI Here for follow-up of menorrhagia  Menorrhagia:  Continued heavy regular monthly periods.  No lightheadedness, dizziness.  Thinks she is ready for mirena to try to help with bleeding.  Did not return for labwork.  HYPERTENSION  BP Readings from Last 3 Encounters:  01/28/13 138/84  08/14/12 130/85  05/23/12 154/94    Hypertension ROS: taking medications as instructed, no medication side effects noted, patient does not perform home BP monitoring and no chest pain on exertion.   Some mild swelling on ankles  Obesity: has has good success with pool therapy in the past.  Interested in medical nturition therapy Review of Systems    see HPI Objective:   Physical Exam GEN: Alert & Oriented, No acute distress, morbidly obese CV:  Regular Rate & Rhythm, no murmur Respiratory:  Normal work of breathing, CTAB Abd:  + BS, soft, no tenderness to palpation Ext: trace pre-tibial edema        Assessment & Plan:

## 2013-01-29 ENCOUNTER — Other Ambulatory Visit: Payer: BC Managed Care – PPO

## 2013-01-29 DIAGNOSIS — E669 Obesity, unspecified: Secondary | ICD-10-CM

## 2013-01-29 DIAGNOSIS — N920 Excessive and frequent menstruation with regular cycle: Secondary | ICD-10-CM

## 2013-01-29 DIAGNOSIS — Z7251 High risk heterosexual behavior: Secondary | ICD-10-CM

## 2013-01-29 LAB — LIPID PANEL
LDL Cholesterol: 105 mg/dL — ABNORMAL HIGH (ref 0–99)
Total CHOL/HDL Ratio: 3 Ratio
Triglycerides: 57 mg/dL (ref <150)
VLDL: 11 mg/dL (ref 0–40)

## 2013-01-29 LAB — CBC WITH DIFFERENTIAL/PLATELET
Basophils Relative: 0 % (ref 0–1)
Eosinophils Absolute: 0.1 10*3/uL (ref 0.0–0.7)
MCH: 25.9 pg — ABNORMAL LOW (ref 26.0–34.0)
MCHC: 32.7 g/dL (ref 30.0–36.0)
Monocytes Relative: 7 % (ref 3–12)
Neutrophils Relative %: 50 % (ref 43–77)
Platelets: 252 10*3/uL (ref 150–400)

## 2013-01-29 LAB — COMPREHENSIVE METABOLIC PANEL
AST: 14 U/L (ref 0–37)
Albumin: 3.9 g/dL (ref 3.5–5.2)
Alkaline Phosphatase: 71 U/L (ref 39–117)
Potassium: 4.1 mEq/L (ref 3.5–5.3)
Sodium: 139 mEq/L (ref 135–145)
Total Protein: 7.1 g/dL (ref 6.0–8.3)

## 2013-01-29 LAB — TSH: TSH: 2.654 u[IU]/mL (ref 0.350–4.500)

## 2013-01-29 NOTE — Progress Notes (Signed)
CMP,CBC WITH DIFF,FLP,TSH,RPR AND HIV DONE TODAY Faige Seely

## 2013-01-31 ENCOUNTER — Telehealth: Payer: Self-pay | Admitting: Family Medicine

## 2013-01-31 DIAGNOSIS — R7303 Prediabetes: Secondary | ICD-10-CM

## 2013-01-31 NOTE — Telephone Encounter (Signed)
Discussed pre-diabetes with patient.  Is planning on following up with nutritionist.

## 2013-03-06 ENCOUNTER — Ambulatory Visit (INDEPENDENT_AMBULATORY_CARE_PROVIDER_SITE_OTHER): Payer: BC Managed Care – PPO | Admitting: Family Medicine

## 2013-03-06 ENCOUNTER — Ambulatory Visit: Payer: BC Managed Care – PPO

## 2013-03-06 VITALS — BP 148/80 | HR 88 | Temp 98.8°F | Resp 18 | Ht 69.0 in | Wt 385.0 lb

## 2013-03-06 DIAGNOSIS — R5381 Other malaise: Secondary | ICD-10-CM

## 2013-03-06 DIAGNOSIS — R5383 Other fatigue: Secondary | ICD-10-CM

## 2013-03-06 DIAGNOSIS — R0789 Other chest pain: Secondary | ICD-10-CM

## 2013-03-06 DIAGNOSIS — R51 Headache: Secondary | ICD-10-CM

## 2013-03-06 DIAGNOSIS — R0781 Pleurodynia: Secondary | ICD-10-CM

## 2013-03-06 DIAGNOSIS — I10 Essential (primary) hypertension: Secondary | ICD-10-CM

## 2013-03-06 DIAGNOSIS — R071 Chest pain on breathing: Secondary | ICD-10-CM

## 2013-03-06 LAB — POCT URINALYSIS DIPSTICK
Bilirubin, UA: NEGATIVE
Glucose, UA: NEGATIVE
Ketones, UA: NEGATIVE
Leukocytes, UA: NEGATIVE
Nitrite, UA: NEGATIVE

## 2013-03-06 LAB — POCT CBC
Granulocyte percent: 56.8 %G (ref 37–80)
HCT, POC: 40.1 % (ref 37.7–47.9)
Hemoglobin: 12.3 g/dL (ref 12.2–16.2)
MCV: 85.5 fL (ref 80–97)
POC LYMPH PERCENT: 36.8 %L (ref 10–50)
RDW, POC: 17 %

## 2013-03-06 LAB — POCT UA - MICROSCOPIC ONLY: Bacteria, U Microscopic: NEGATIVE

## 2013-03-06 MED ORDER — LISINOPRIL-HYDROCHLOROTHIAZIDE 20-12.5 MG PO TABS: 1.0000 | ORAL_TABLET | Freq: Every day | ORAL | Status: DC

## 2013-03-06 NOTE — Progress Notes (Signed)
[address]   [place], [place]  [address]   [phone]  Subjective:    Patient ID: Haley Wyatt, female    DOB: November 17, 1975, 37 y.o.   MRN: [id number]  HPI This 37 y.o. female presents for evaluation of headache, shortness of breath, chest tightness, fatigue.  Awoke this morning upon awakening, with sluggish, headache.  Light day.  Thoughts not clear.  Headache and sluggishness worrisome.    1.  Headache: severe headache; 8/10; no history of headaches or migraines; mild blurred vision; mild dizziness; no numbness or tingling; no fever/chills/sweats; no malaise.  No tick bites in past month.  No nausea or vomiting.  Has taken Ibuprofen 800mg .  Worked all day but did have a light day at work.  Completed work without difficulties; sluggish and thoughts not as clear.   2.  Chest tightness: intermittent; pain with deep breathing. Pain located in substernal region B.    +Uneasiness.  No SOB; just fatigue.  +Sleeping well.  Slept well last night.  Snoring.  No apnea; s/p sleep study; no sleep apnea.  Exertion does not worsen chest tightness.  No coughing.  No wheezing.  No allergy symptoms.  No leg swelling; no orthopnea; no exertional chest pain; fatigue worsens with exertion; +traveled to [place] by car one month ago. No family history of early CAD; grandmother had AMI in late 27s.  Aunt had blood clot in past.  No indigestion, heartburn, reflux issues.  Severity 7/10 with deep inspiration.  3.  Fatigue/sluggish:  Onset today.  No associated fever/chills/sweats; no associated cold symptoms or cough.  No n/v/d/c.  No recent tick bites.  S/p BTL; regular menses.  No history of anemia.  4. HTN: ran out of medication two days ago.  BP usually runs not sure; does not check at home.  Usually does not run out of medication; does not get headache with elevations in blood pressure.     Review of Systems  Constitutional: Positive for fatigue. Negative for fever, chills and diaphoresis.  HENT:  Negative for ear pain, congestion, sore throat, rhinorrhea, trouble swallowing, voice change, postnasal drip and sinus pressure.   Eyes: Positive for visual disturbance. Negative for photophobia, pain and redness.  Respiratory: Positive for chest tightness. Negative for cough and shortness of breath.   Cardiovascular: Positive for chest pain and leg swelling. Negative for palpitations.  Gastrointestinal: Negative for nausea, vomiting, abdominal pain and diarrhea.  Endocrine: Negative for cold intolerance, heat intolerance, polydipsia, polyphagia and polyuria.  Genitourinary: Negative for dysuria, urgency, frequency, hematuria and flank pain.  Musculoskeletal: Negative for joint swelling and arthralgias.  Skin: Negative for rash.  Neurological: Positive for dizziness and headaches. Negative for tremors, seizures, syncope, facial asymmetry, speech difficulty, weakness, light-headedness and numbness.  Psychiatric/Behavioral: Positive for decreased concentration. Negative for confusion.    Past Medical History  Diagnosis Date  . Hypertension   . Allergy     Past Surgical History  Procedure Laterality Date  . Shoulder surgery      left shoulder loose body removal  . Carpal tunnel release      Prior to Admission medications   Medication Sig Start Date End Date Taking? Authorizing Provider  lisinopril-hydrochlorothiazide (PRINZIDE,ZESTORETIC) 20-12.5 MG per tablet Take 2 tablets by mouth daily. 05/23/12  Yes Macy Mis, MD  lisinopril-hydrochlorothiazide (PRINZIDE,ZESTORETIC) 20-12.5 MG per tablet TAKE ONE TABLET BY MOUTH EVERY DAY 08/31/12  Yes [name], DO    No Known Allergies  History  Social History  . Marital Status: Single    Spouse Name: N/A    Number of Children: N/A  . 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 Smoking status: Former Smoker    Types: Cigarettes  . Smokeless tobacco: Not on file  .  Alcohol Use: No  . Drug Use: No  . Sexually Active: Not Currently   Other Topics Concern  . Not on file   Social History Narrative   Marital status: single      Children: four      Employment:  Mental health professional      Tobacco: none       Alcohol: wine per year.      Drugs: none      Exercise: stopped exercise one month ago.    Family History  Problem Relation Age of Onset  . Hyperlipidemia Father   . Hypertension Father   . Diabetes Father   . Sudden death Neg Hx   . Heart attack Neg Hx        Objective:   Physical Exam  Nursing note and vitals reviewed. Constitutional: She is oriented to person, place, and time. She appears well-developed and well-nourished. No distress.  Obese.  HENT:  Head: Normocephalic and atraumatic.  Right Ear: External ear normal.  Left Ear: External ear normal.  Mouth/Throat: Oropharynx is clear and moist.  Eyes: Conjunctivae and EOM are normal. Pupils are equal, round, and reactive to light.  Neck: Normal range of motion. Neck supple.  Cardiovascular: Normal rate, regular rhythm and normal heart sounds.  Exam reveals no gallop and no friction rub.   No murmur heard. 1+ pitting edema B feet.  No calf TTP; Hommen's negative B.  Pulmonary/Chest: Effort normal and breath sounds normal. She has no wheezes. She has no rales.  Lymphadenopathy:    She has no cervical adenopathy.  Neurological: She is alert and oriented to person, place, and time. No cranial nerve deficit. She exhibits normal muscle tone. Coordination normal.  Skin: Skin is warm and dry. No rash noted. She is not diaphoretic.  Psychiatric: She has a normal mood and affect. Her behavior is normal. Judgment and thought content normal.   EKG:  NSR; no acute changes.  [facility] reading (PRIMARY) by  Dr. Katrinka Blazing.  CXR:  NAD.  Results for orders placed in visit on 03/06/13  POCT CBC      Result Value Range   WBC 9.2  4.6 - 10.2 K/uL   Lymph, poc 3.4  0.6 - 3.4   POC LYMPH PERCENT  36.8  10 - 50 %L   MID (cbc) 0.6  0 - 0.9   POC MID % 6.4  0 - 12 %M   POC Granulocyte 5.2  2 - 6.9   Granulocyte percent 56.8  37 - 80 %G   RBC 4.69  4.04 - 5.48 M/uL   Hemoglobin 12.3  12.2 - 16.2 g/dL   HCT, POC 11.9  14.7 - 47.9 %   MCV 85.5  80 - 97 fL   MCH, POC 26.2 (*) 27 - 31.2 pg   MCHC 30.7 (*) 31.8 - 35.4 g/dL   RDW, POC 82.9     Platelet Count, POC 259  142 - 424 K/uL   MPV 8.6  0 - 99.8 fL  GLUCOSE, POCT (MANUAL RESULT ENTRY)      Result Value Range   POC Glucose  106 (*) 70 - 99 mg/dl  POCT URINALYSIS DIPSTICK      Result Value Range   Color, UA yellow     Clarity, UA clear     Glucose, UA neg     Bilirubin, UA neg     Ketones, UA neg     Spec Grav, UA 1.025     Blood, UA neg     pH, UA 5.5     Protein, UA neg     Urobilinogen, UA 0.2     Nitrite, UA neg     Leukocytes, UA Negative    POCT UA - MICROSCOPIC ONLY      Result Value Range   WBC, Ur, HPF, POC 0-3     RBC, urine, microscopic 0-1     Bacteria, U Microscopic neg     Mucus, UA small     Epithelial cells, urine per micros 5-10     Crystals, Ur, HPF, POC neg     Casts, Ur, LPF, POC neg     Yeast, UA neg     Amorphous small    POCT URINE PREGNANCY      Result Value Range   Preg Test, Ur Negative          Assessment & Plan:  Chest tightness - Plan: EKG 12-Lead, DG Chest 2 View, POCT CBC, POCT glucose (manual entry), POCT urinalysis dipstick, POCT UA - Microscopic Only, Comprehensive metabolic panel  Other malaise and fatigue - Plan: DG Chest 2 View, POCT CBC, POCT glucose (manual entry), POCT urinalysis dipstick, POCT UA - Microscopic Only, Comprehensive metabolic panel, POCT urine pregnancy  Pleuritic chest pain  Headache - Plan: DG Chest 2 View, POCT CBC, POCT glucose (manual entry), POCT urinalysis dipstick, POCT UA - Microscopic Only, Comprehensive metabolic panel  HYPERTENSION    1. Chest pain pleuritic:  New.  Ddx includes pleurisy, PE.  No exertional component to suggest cardiac  origin; EKG NSR without acute abnormalities.  CXR normal; pulse oximetry normal.  Risk factors for PE include recent travel and family history of DVT in aunt.  Obtain D-Dimer; pt refused CT chest tonight. 2.  Malaise/fatigue: New. Etiology unclear.  CBC and glucose stable; urine negative; urine pregnancy negative. Recommend rest, fluids, supportive care. 3.  HA:  New.  Associated with fatigue, malaise, pleuritic chest pain.  Normal neurological exam.  To ED for acute worsening or development of focal neurological symptoms. 4.  HTN: stable despite non-compliance with medications; refills provided.    Meds ordered this encounter  Medications  . DISCONTD: lisinopril-hydrochlorothiazide (PRINZIDE,ZESTORETIC) 20-12.5 MG per tablet    Sig: Take 1 tablet by mouth daily.    Dispense:  30 tablet    Refill:  2

## 2013-03-06 NOTE — Patient Instructions (Addendum)
1.  TO EMERGENCY DEPARTMENT FOR DEVELOPMENT OF WORSENING SHORTNESS OF BREATH, WORSENING CHEST TIGHTNESS, WORSENING HEADACHE.

## 2013-03-07 LAB — COMPREHENSIVE METABOLIC PANEL
Albumin: 3.9 g/dL (ref 3.5–5.2)
BUN: 11 mg/dL (ref 6–23)
Calcium: 8.9 mg/dL (ref 8.4–10.5)
Chloride: 103 mEq/L (ref 96–112)
Glucose, Bld: 103 mg/dL — ABNORMAL HIGH (ref 70–99)
Potassium: 4.1 mEq/L (ref 3.5–5.3)
Sodium: 138 mEq/L (ref 135–145)
Total Protein: 7 g/dL (ref 6.0–8.3)

## 2013-04-04 ENCOUNTER — Other Ambulatory Visit: Payer: Self-pay

## 2013-04-04 MED ORDER — LISINOPRIL-HYDROCHLOROTHIAZIDE 20-12.5 MG PO TABS: 1.0000 | ORAL_TABLET | Freq: Every day | ORAL | Status: DC

## 2013-05-03 ENCOUNTER — Telehealth (INDEPENDENT_AMBULATORY_CARE_PROVIDER_SITE_OTHER): Payer: Self-pay

## 2013-05-03 ENCOUNTER — Ambulatory Visit (INDEPENDENT_AMBULATORY_CARE_PROVIDER_SITE_OTHER): Payer: BC Managed Care – PPO | Admitting: Surgery

## 2013-05-03 NOTE — Telephone Encounter (Signed)
LMOM letting pt know that she no showed her appt with Dr. Daphine Deutscher this morning.  I asked that she call our office to reschedule that appt (lap band initial)

## 2013-05-09 ENCOUNTER — Encounter (INDEPENDENT_AMBULATORY_CARE_PROVIDER_SITE_OTHER): Payer: Self-pay | Admitting: Surgery

## 2013-05-09 ENCOUNTER — Ambulatory Visit (INDEPENDENT_AMBULATORY_CARE_PROVIDER_SITE_OTHER): Payer: BC Managed Care – PPO | Admitting: Surgery

## 2013-05-09 ENCOUNTER — Other Ambulatory Visit (INDEPENDENT_AMBULATORY_CARE_PROVIDER_SITE_OTHER): Payer: Self-pay

## 2013-05-09 VITALS — BP 132/80 | HR 72 | Temp 98.4°F | Resp 16 | Ht 69.0 in | Wt 383.2 lb

## 2013-05-09 DIAGNOSIS — E669 Obesity, unspecified: Secondary | ICD-10-CM

## 2013-05-09 DIAGNOSIS — Z6841 Body Mass Index (BMI) 40.0 and over, adult: Secondary | ICD-10-CM

## 2013-05-09 LAB — CBC WITH DIFFERENTIAL/PLATELET
Eosinophils Absolute: 0.1 10*3/uL (ref 0.0–0.7)
Eosinophils Relative: 1 % (ref 0–5)
Hemoglobin: 12.2 g/dL (ref 12.0–15.0)
Lymphs Abs: 3.1 10*3/uL (ref 0.7–4.0)
MCH: 26.5 pg (ref 26.0–34.0)
MCV: 80.3 fL (ref 78.0–100.0)
Monocytes Absolute: 0.3 10*3/uL (ref 0.1–1.0)
Monocytes Relative: 5 % (ref 3–12)
RBC: 4.61 MIL/uL (ref 3.87–5.11)

## 2013-05-09 LAB — HEMOGLOBIN A1C: Hgb A1c MFr Bld: 6.2 % — ABNORMAL HIGH (ref <5.7)

## 2013-05-09 NOTE — Patient Instructions (Signed)
Sleeve Gastrectomy A sleeve gastrectomy is an operation that removes a large portion of your stomach. This operation is performed to help you lose weight. You lose weight with this operation because it restricts the amount of food you can eat. Your stomach will be a narrow tube after the operation (the size of a banana). Your stomach will hold much less food than your normal stomach. Also, the portion of your stomach that is removed produces a hormone that causes hunger. You are a candidate for this operation if you have morbid obesity, defined as a body mass index (BMI) greater than 40. You may also be a candidate if you have severe obesity related diseases such as: diabetes mellitus 2, obstructive sleep apnea, or cardiopulmonary disease (heart and lung) with a BMI greater than 35. You will need to talk with your surgeon and insurance company to find out if this surgery is right for you.  Sleeve gastrectomy is a good alternative to other treatments of obesity (bariatric) operations. It does not require any adjustments after the operation compared with an adjustable gastric band. Also, it is safer than a gastric bypass. RISKS AND COMPLICATIONS Some of the problems that can occur from this procedure include:  Infection. A germ starts growing in the incision sites. This can usually be treated with antibiotics.  Bleeding. This can occur with any surgery. Your surgeon will take all precautions to minimize this risk.  Damage to tissue or organs in the area may occur. If there is excessive damage, the surgeon may need to change to an open surgery. In this case, one large incision will be made in the center of your abdomen.  Leakage. The fluid in your stomach may leak into the abdominal cavity. If this happens, you may need another surgery to fix the leak. BEFORE THE PROCEDURE Before your operation you will meet with your surgeon and their team for the treatment of obesity. Here you will find out if you are  a candidate for bariatric surgery. The risks and the benefits of the operation will be explained. You will also meet a:  Dietician who will guide you with your preoperative and postoperative diet.  An internal medical doctor to manage your obesity related illnesses.  A psychology team to help with cravings or other mental difficulties. In addition:  You will be directed to have certain lab work and x-rays performed.  You will schedule a special test called a manometry. This test evaluates your esophagus and how it moves.  You will be placed on a special liquid diet two to three weeks before your operation. This diet helps you lose weight before the operation and decrease the amount of fat in the abdomen. It makes the operation easier for the surgeon and safer for the patient. The dietician will share the details of this with you. Before your operation:  Make sure you follow your surgeon's instructions exactly. Stop or continue medications they recommend.  Do not eat or drink anything after midnight.  Arrive at the hospital 1 hour before your surgery for check in.  Shower the morning of your operation. PROCEDURE  Most sleeve gastrectomies are performed using a laparoscope. A laparoscope is a thin, lighted, pencil-sized tube. Once you are anesthetized (asleep), the surgeon inflates your belly (abdomen) with a gas (carbon dioxide) that makes room to operate. It also makes your organs easier to see. The laparoscope is inserted into the abdomen through a small incision. Other small instruments are inserted into the abdomen   through other small incisions. During the operation, the stomach is divided using a stapler. Part of the stomach is removed through one of the incisions. The remaining stomach is reinforced using a stitch (suture) and surgical glue to prevent leakage of the gastric contents. At the end of the procedure, the gas is removed from the inside of your abdomen. The incisions are closed  with stitches. These may be covered with a dressing or left open. Because the incisions are small, there is usually minimal discomfort. You will wake up in a recovery room. Once your anesthesia has worn off, you will be moved to your hospital room. AFTER THE PROCEDURE  You will stay in the hospital, on average, for two days.  You will be given pain medication and anti-nausea medication.  You may have a drain from one of the incisions in your abdomen. This drain will stay in place until your first postoperative visit.  The nursing staff will assist you in getting out of bed the day of, or one day after, your surgery.  You will start on a liquid diet, the first day after your operation. The dietician will recommend this diet.  Taking deep breaths and coughing is very important to avoid pneumonia. Document Released: 08/07/2009 Document Revised: 01/02/2012 Document Reviewed: 08/07/2009 [employer] Patient Information 2014 [employer].  

## 2013-05-09 NOTE — Progress Notes (Signed)
Chief Complaint:  Morbid obesity BMI 57  History of Present Illness:  Haley Wyatt is an 37 y.o. female who is a cousin to Silvio Pate and has had obesity during her adult years after childbirth. She is up to a BMI of almost 57. She does not have diabetes has hypertension and prediabetes. 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 After discussing the 3 options with her trying pictures for her she is interested in a sleeve gastrectomy. I think that a reasonable alternative and so we will move forward with a l bariatric workup for a sleeve gastrectomy.  Past Medical History  Diagnosis Date  . Hypertension   . Allergy     Past Surgical History  Procedure Laterality Date  . Shoulder surgery      left shoulder loose body removal  . Carpal tunnel release    . Tubal ligation      Current Outpatient Prescriptions  Medication Sig Dispense Refill  . lisinopril-hydrochlorothiazide (PRINZIDE,ZESTORETIC) 20-12.5 MG per tablet Take 1 tablet by mouth daily.  90 tablet  1   No current facility-administered medications for this visit.   Review of patient's allergies indicates no known allergies. Family History  Problem Relation Age of Onset  . Hyperlipidemia Father   . Hypertension Father   . Diabetes Father   . Sudden death Neg Hx   . Heart attack Neg Hx   . Cancer Mother    Social History:   reports that she has quit smoking. Her smoking use included Cigarettes. She smoked 0.00 packs per day. She does not have any smokeless tobacco history on file. She reports that she does not drink alcohol or use illicit drugs.   REVIEW OF SYSTEMS - PERTINENT POSITIVES ONLY: No history of DVT  Physical Exam:   Blood pressure 132/80, pulse 72, temperature 98.4 F (36.9 C), temperature source Temporal, resp. rate 16, height 5\' 9"  (1.753 m), weight 383 lb 3.2 oz (173.818 kg). Body mass index is 56.56 kg/(m^2).  Gen:  WDWN African American female NAD  Neurological: Alert and  oriented to person, place, and time. Motor and sensory function is grossly intact  Head: Normocephalic and atraumatic.  Eyes: Conjunctivae are normal. Pupils are equal, round, and reactive to light. No scleral icterus.  Neck: Normal range of motion. Neck supple. No tracheal deviation or thyromegaly present.  Cardiovascular:  SR without murmurs or gallops.  No carotid bruits Respiratory: Effort normal.  No respiratory distress. No chest wall tenderness. Breath sounds normal.  No wheezes, rales or rhonchi.  Abdomen:  Obese and nontender. GU: Musculoskeletal: Normal range of motion. Extremities are nontender. No cyanosis, edema or clubbing noted Lymphadenopathy: No cervical, preauricular, postauricular or axillary adenopathy is present Skin: Skin is warm and dry. No rash noted. No diaphoresis. No erythema. No pallor. Pscyh: Normal mood and affect. Behavior is normal. Judgment and thought content normal.   LABORATORY RESULTS: No results found for this or any previous visit (from the past 48 hour(s)).  RADIOLOGY RESULTS: No results found.  Problem List: Patient Active Problem List   Diagnosis Date Noted  . Pre-diabetes 01/31/2013  . Menorrhagia 05/23/2012  . Bacterial vaginosis 05/23/2012  . 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 Left shoulder pain 10/28/2011  . Asteatotic eczema 04/15/2011  . 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 Groin pain 12/06/2010  . HYPERTENSION 08/16/2010  . 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 LEG EDEMA, 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  . GERD 06/11/2010  . HYPOTHYROIDISM  04/16/2009  . OBESITY 04/16/2009  . DEPRESSION 03/30/2009    Assessment & Plan: Morbid obesity BMI 57 plan workup toward a sleeve gastrectomy.    Matt B. Daphine Deutscher, MD, [facility] [phone] beeper [phone]  05/09/2013 11:39 AM

## 2013-05-10 LAB — H. PYLORI ANTIBODY, IGG: H Pylori IgG: <0.4 {ISR}

## 2013-05-22 ENCOUNTER — Ambulatory Visit (HOSPITAL_BASED_OUTPATIENT_CLINIC_OR_DEPARTMENT_OTHER): Payer: BC Managed Care – PPO | Attending: Surgery | Admitting: Radiology

## 2013-05-22 DIAGNOSIS — G4710 Hypersomnia, unspecified: Secondary | ICD-10-CM | POA: Insufficient documentation

## 2013-05-25 DIAGNOSIS — R0989 Other specified symptoms and signs involving the circulatory and respiratory systems: Secondary | ICD-10-CM

## 2013-05-25 DIAGNOSIS — R0609 Other forms of dyspnea: Secondary | ICD-10-CM

## 2013-05-25 DIAGNOSIS — G4710 Hypersomnia, unspecified: Secondary | ICD-10-CM

## 2013-05-25 DIAGNOSIS — G4730 Sleep apnea, unspecified: Secondary | ICD-10-CM

## 2013-05-26 NOTE — Procedures (Signed)
Haley Wyatt, Haley Wyatt             ACCOUNT NO.:  [id number]  MEDICAL RECORD NO.:  [id number]          PATIENT TYPE:  OUT  LOCATION:  SLEEP CENTER                 FACILITY:  [facility]  PHYSICIAN:  Welford Christmas D. Maple Hudson, MD, FCCP, FACPDATE OF BIRTH:  09-Feb-1974  DATE OF STUDY:  05/22/2013                           NOCTURNAL POLYSOMNOGRAM  REFERRING PHYSICIAN:  Thornton Park. Daphine Deutscher, MD  INDICATION FOR STUDY:  Hypersomnia with sleep apnea.  EPWORTH SLEEPINESS SCORE:  13/24, BMI 56.6, weight 383 pounds, height 69 inches, neck 17 inches.  HOME MEDICATIONS:  Charted for review.  SLEEP ARCHITECTURE:  Total sleep time 315 minutes with sleep efficiency 87.6%.  Stage I was 8.3%.  Stage II, 81.6%.  Stage III absent.  REM 10.2% of total sleep time.  Sleep latency 10 minutes, REM latency 63.5 minutes, awake after sleep onset 34 minutes.  Arousal index 8.4. Bedtime medication:  Multivitamin, ibuprofen.  Respiratory data:  Apnea-hypopnea index (AHI) 2.1 per hour.  A total of 11 events was scored including 1 obstructive apnea and 10 hypopneas. All events were associated with supine sleep position.  REM AHI 20.6 per hour.  The split protocol CPAP titration was not done.  Oxygen data:  Moderate snoring with oxygen desaturation to a nadir of 85% and mean oxygen saturation through the study of 95.4% on room air.  Cardiac data:  Normal sinus rhythm.  Movement/parasomnia:  Few incidental limb jerks were noted with little effect on sleep.  IMPRESSION/RECOMMENDATION: 1. Sleep architecture was remarkable only for increased spontaneous     waking during the night and less REM than expected. 2. Occasional respiratory event with sleep disturbance, within normal     limits.  AHI 2.1 per hour, (the normal AHI for adults is between 0     and 5 events per hour).  Moderate snoring with oxygen desaturation     to a nadir of 85% and mean oxygen saturation through the study of     95.4% on room air.    Mikayla Chiusano D.  Maple Hudson, MD, [facility], FACP Diplomate, American Board of Sleep Medicine   CDY/MEDQ  D:  05/25/2013 11:59:22  T:  05/25/2013 13:31:26  Job:  [id number]

## 2013-05-28 ENCOUNTER — Ambulatory Visit: Payer: BC Managed Care – PPO | Admitting: *Deleted

## 2013-05-31 ENCOUNTER — Ambulatory Visit (HOSPITAL_COMMUNITY)
Admission: RE | Admit: 2013-05-31 | Discharge: 2013-05-31 | Disposition: A | Discharge status: Home / Self Care | Payer: BC Managed Care – PPO | Source: Ambulatory Visit | Attending: Surgery | Admitting: Surgery

## 2013-05-31 ENCOUNTER — Ambulatory Visit (HOSPITAL_COMMUNITY): Admission: RE | Admit: 2013-05-31 | Payer: BC Managed Care – PPO | Source: Ambulatory Visit

## 2013-05-31 ENCOUNTER — Encounter: Payer: Self-pay | Admitting: *Deleted

## 2013-05-31 DIAGNOSIS — Z6841 Body Mass Index (BMI) 40.0 and over, adult: Secondary | ICD-10-CM | POA: Insufficient documentation

## 2013-05-31 DIAGNOSIS — Z01818 Encounter for other preprocedural examination: Secondary | ICD-10-CM | POA: Insufficient documentation

## 2013-05-31 DIAGNOSIS — F329 Major depressive disorder, single episode, unspecified: Secondary | ICD-10-CM | POA: Insufficient documentation

## 2013-05-31 DIAGNOSIS — R7309 Other abnormal glucose: Secondary | ICD-10-CM | POA: Insufficient documentation

## 2013-05-31 DIAGNOSIS — Z01812 Encounter for preprocedural laboratory examination: Secondary | ICD-10-CM | POA: Insufficient documentation

## 2013-05-31 DIAGNOSIS — K219 Gastro-esophageal reflux disease without esophagitis: Secondary | ICD-10-CM | POA: Insufficient documentation

## 2013-05-31 DIAGNOSIS — F3289 Other specified depressive episodes: Secondary | ICD-10-CM | POA: Insufficient documentation

## 2013-05-31 DIAGNOSIS — I10 Essential (primary) hypertension: Secondary | ICD-10-CM | POA: Insufficient documentation

## 2013-05-31 DIAGNOSIS — E039 Hypothyroidism, unspecified: Secondary | ICD-10-CM | POA: Insufficient documentation

## 2013-06-26 ENCOUNTER — Ambulatory Visit: Payer: BC Managed Care – PPO | Admitting: Dietician

## 2013-07-08 ENCOUNTER — Telehealth (INDEPENDENT_AMBULATORY_CARE_PROVIDER_SITE_OTHER): Payer: Self-pay | Admitting: *Deleted

## 2013-07-08 ENCOUNTER — Other Ambulatory Visit (INDEPENDENT_AMBULATORY_CARE_PROVIDER_SITE_OTHER): Payer: Self-pay

## 2013-07-08 NOTE — Telephone Encounter (Signed)
Patient called to state that the last thing checked on her pre-surgery paper was for a breath-tek.  Patient ask about having this ordered and scheduled so she can have everything done.  Spoke to [employer] CMA who is going to check on this and will let the patient know.  Patient aware this has to be scheduled and she can't just walk in for it.  Patient states understanding and agreeable at this time.

## 2013-07-16 ENCOUNTER — Ambulatory Visit (INDEPENDENT_AMBULATORY_CARE_PROVIDER_SITE_OTHER): Payer: BC Managed Care – PPO | Admitting: Family Medicine

## 2013-07-16 ENCOUNTER — Other Ambulatory Visit (HOSPITAL_COMMUNITY)
Admission: RE | Admit: 2013-07-16 | Discharge: 2013-07-16 | Disposition: A | Discharge status: Home / Self Care | Payer: BC Managed Care – PPO | Source: Ambulatory Visit | Attending: Family Medicine | Admitting: Family Medicine

## 2013-07-16 ENCOUNTER — Encounter: Payer: Self-pay | Admitting: Family Medicine

## 2013-07-16 VITALS — BP 130/72 | HR 68 | Temp 98.0°F | Ht 69.0 in | Wt 382.0 lb

## 2013-07-16 DIAGNOSIS — Z01419 Encounter for gynecological examination (general) (routine) without abnormal findings: Secondary | ICD-10-CM | POA: Insufficient documentation

## 2013-07-16 DIAGNOSIS — R5383 Other fatigue: Secondary | ICD-10-CM

## 2013-07-16 DIAGNOSIS — R5381 Other malaise: Secondary | ICD-10-CM

## 2013-07-16 DIAGNOSIS — Z124 Encounter for screening for malignant neoplasm of cervix: Secondary | ICD-10-CM

## 2013-07-16 DIAGNOSIS — H6590 Unspecified nonsuppurative otitis media, unspecified ear: Secondary | ICD-10-CM

## 2013-07-16 DIAGNOSIS — Z113 Encounter for screening for infections with a predominantly sexual mode of transmission: Secondary | ICD-10-CM | POA: Insufficient documentation

## 2013-07-16 DIAGNOSIS — N898 Other specified noninflammatory disorders of vagina: Secondary | ICD-10-CM

## 2013-07-16 DIAGNOSIS — I10 Essential (primary) hypertension: Secondary | ICD-10-CM

## 2013-07-16 DIAGNOSIS — R51 Headache: Secondary | ICD-10-CM

## 2013-07-16 DIAGNOSIS — R519 Headache, unspecified: Secondary | ICD-10-CM

## 2013-07-16 LAB — POCT WET PREP (WET MOUNT)

## 2013-07-16 MED ORDER — ANTIPYRINE-BENZOCAINE 5.4-1.4 % OT SOLN
3.0000 [drp] | Freq: Four times a day (QID) | OTIC | Status: DC | PRN
Start: 2013-07-16 — End: 2014-01-01

## 2013-07-16 MED ORDER — LISINOPRIL-HYDROCHLOROTHIAZIDE 20-12.5 MG PO TABS: 1.0000 | ORAL_TABLET | Freq: Every day | ORAL | Status: DC

## 2013-07-16 MED ORDER — METRONIDAZOLE 0.75 % VA GEL: 1.0000 | Freq: Every day | VAGINAL | Status: DC

## 2013-07-16 NOTE — Progress Notes (Signed)
Subjective:     Patient ID: Haley Wyatt, female   DOB: 1976/04/29, 37 y.o.   MRN: [id number]  HPI HTN: Here for follow up,she is compliant with her Zestoretic 20/12.5 mg qd,she need refill. Vaginal discharge: Brownish vaginal discharge for 2 wks for 2wks,no urinary symptoms,suprapubic pain. She is sexually since Aug 2013 with same partner. Uses condom. Tubal ligation. Headache: Since last week, took tylenol cold and flu,mucinex,mild improvement,feels better today. No N/V,no change in her vision,no fever.She has a lot of stress at her job but has been taking it easy lately. Earache:C/O earache, initially B/L pain now on left. No ear discharge,no hearing loss.Low grade fever few days ago. Fatigue:feels weak and tired for the last few weeks associated with occasional dizziness since 5 days. No fall or LOC,hx of menorrhagia. Appointment with Dr Gaynell Face next week for this.She has poor appetite. Gyne: Need pap test.LMP about a month ago,regular but heavy. Obesity: Working on diet,does not get much exercise,would like to lose weight.  Review of Systems  Constitutional: Positive for fatigue.  HENT: Positive for ear pain and tinnitus. Negative for hearing loss, rhinorrhea and ear discharge.   Respiratory: Negative.   Cardiovascular: Negative.   Gastrointestinal: Negative.   Genitourinary: Positive for vaginal discharge. Negative for vaginal bleeding.  Neurological: Positive for dizziness and headaches.  Psychiatric/Behavioral: Negative.   All other systems reviewed and are negative.       Objective:   Physical Exam  Nursing note and vitals reviewed. Constitutional: She is oriented to person, place, and time. She appears well-developed. No distress.  HENT:  Head: Normocephalic.  Right Ear: External ear and ear canal normal. No drainage, swelling or tenderness. A middle ear effusion is present. No decreased hearing is noted.  Left Ear: Tympanic membrane, external ear and ear canal normal.  No drainage, swelling or tenderness.  No middle ear effusion.  Eyes: Pupils are equal, round, and reactive to light.  Neck: Neck supple.  Cardiovascular: Normal rate and normal heart sounds.   No murmur heard. Pulmonary/Chest: Effort normal and breath sounds normal. No respiratory distress. She has no wheezes. She exhibits no tenderness.  Abdominal: Soft. Bowel sounds are normal. She exhibits no distension and no mass. There is no tenderness.  Genitourinary: Uterus normal. No labial fusion. There is no rash, tenderness, lesion or injury on the right labia. There is no rash, tenderness, lesion or injury on the left labia. Cervix exhibits discharge. Cervix exhibits no motion tenderness and no friability. Right adnexum displays no mass and no tenderness. Left adnexum displays no mass and no tenderness. Vaginal discharge found.  Musculoskeletal: She exhibits no edema.  Neurological: She is alert and oriented to person, place, and time. She has normal reflexes. No cranial nerve deficit. Coordination normal.  No signs of meningeal irritation       Assessment/Plan:     HTN: Stable Vaginal discharge: BV+ Headache: Stress related. Otalgia: Mid ear effusion on right Fatigue Gynecologic exam Obesity.

## 2013-07-16 NOTE — Assessment & Plan Note (Signed)
BP optimal. I refilled her meds. 

## 2013-07-16 NOTE — Patient Instructions (Signed)
It was nice meeting you today Haley Wyatt. I am however sorry you do not feel well,for your fatigue and headache for now I will recommend rest,MVI and improve diet for fatigue. i reviewed your lab you do not have anemia. If fatigue persist we would do some more work up. You have fluid in your ear,this should go away by itself, I will reassess at next visit. Tylenol prn pain.     Fatigue Fatigue is a feeling of tiredness, lack of energy, lack of motivation, or feeling tired all the time. Having enough rest, good nutrition, and reducing stress will normally reduce fatigue. Consult your caregiver if it persists. The nature of your fatigue will help your caregiver to find out its cause. The treatment is based on the cause.  CAUSES  There are many causes for fatigue. Most of the time, fatigue can be traced to one or more of your habits or routines. Most causes fit into one or more of three general areas. They are: Lifestyle problems  Sleep disturbances.  Overwork.  Physical exertion.  Unhealthy habits.  Poor eating habits or eating disorders.  Alcohol and/or drug use .  Lack of proper nutrition (malnutrition). Psychological problems  Stress and/or anxiety problems.  Depression.  Grief.  Boredom. Medical Problems or Conditions  Anemia.  Pregnancy.  Thyroid gland problems.  Recovery from major surgery.  Continuous pain.  Emphysema or asthma that is not well controlled  Allergic conditions.  Diabetes.  Infections (such as mononucleosis).  Obesity.  Sleep disorders, such as sleep apnea.  Heart failure or other heart-related problems.  Cancer.  Kidney disease.  Liver disease.  Effects of certain medicines such as antihistamines, cough and cold remedies, prescription pain medicines, heart and blood pressure medicines, drugs used for treatment of cancer, and some antidepressants. SYMPTOMS  The symptoms of fatigue include:   Lack of energy.  Lack of drive  (motivation).  Drowsiness.  Feeling of indifference to the surroundings. DIAGNOSIS  The details of how you feel help guide your caregiver in finding out what is causing the fatigue. You will be asked about your present and past health condition. It is important to review all medicines that you take, including prescription and non-prescription items. A thorough exam will be done. You will be questioned about your feelings, habits, and normal lifestyle. Your caregiver may suggest blood tests, urine tests, or other tests to look for common medical causes of fatigue.  TREATMENT  Fatigue is treated by correcting the underlying cause. For example, if you have continuous pain or depression, treating these causes will improve how you feel. Similarly, adjusting the dose of certain medicines will help in reducing fatigue.  HOME CARE INSTRUCTIONS   Try to get the required amount of good sleep every night.  Eat a healthy and nutritious diet, and drink enough water throughout the day.  Practice ways of relaxing (including yoga or meditation).  Exercise regularly.  Make plans to change situations that cause stress. Act on those plans so that stresses decrease over time. Keep your work and personal routine reasonable.  Avoid street drugs and minimize use of alcohol.  Start taking a daily multivitamin after consulting your caregiver. SEEK MEDICAL CARE IF:   You have persistent tiredness, which cannot be accounted for.  You have fever.  You have unintentional weight loss.  You have headaches.  You have disturbed sleep throughout the night.  You are feeling sad.  You have constipation.  You have dry skin.  You have  gained weight.  You are taking any new or different medicines that you suspect are causing fatigue.  You are unable to sleep at night.  You develop any unusual swelling of your legs or other parts of your body. SEEK IMMEDIATE MEDICAL CARE IF:   You are feeling  confused.  Your vision is blurred.  You feel faint or pass out.  You develop severe headache.  You develop severe abdominal, pelvic, or back pain.  You develop chest pain, shortness of breath, or an irregular or fast heartbeat.  You are unable to pass a normal amount of urine.  You develop abnormal bleeding such as bleeding from the rectum or you vomit blood.  You have thoughts about harming yourself or committing suicide.  You are worried that you might harm someone else. MAKE SURE YOU:   Understand these instructions.  Will watch your condition.  Will get help right away if you are not doing well or get worse. Document Released: 08/07/2007 Document Revised: 01/02/2012 Document Reviewed: 08/07/2007 [facility] Patient Information 2014 [place], [place].

## 2013-07-16 NOTE — Assessment & Plan Note (Signed)
May be stress related. Last H/H was normal,not anemic. MVI recommended and rest. Consider TSH,recheck CBC,Vit b12 if persistent.

## 2013-07-16 NOTE — Assessment & Plan Note (Signed)
BMI 56. Diet and exercise counseling done. To consider nutritionist referral.

## 2013-07-16 NOTE — Assessment & Plan Note (Signed)
Likely stress related. Relaxation technique instructed. Red flag sign discussed. F/U soon if no improvement,in the interim I recommended Tylenol prn HA.

## 2013-07-16 NOTE — Assessment & Plan Note (Signed)
On right. Likely chronic. No sign of acute infection. Antipyrine Otic prn pain. Watchful waiting,if no improvement would benefit from tympanostomy.

## 2013-07-16 NOTE — Assessment & Plan Note (Signed)
Wet pre pos for clue cells and whiff test. Bacterial vaginosis. Metronidazole gel prescribed. F/U if no improvement soon.

## 2013-07-16 NOTE — Assessment & Plan Note (Signed)
PAP done today

## 2013-07-17 ENCOUNTER — Encounter: Payer: BC Managed Care – PPO | Attending: Surgery | Admitting: Dietician

## 2013-07-17 ENCOUNTER — Encounter: Payer: Self-pay | Admitting: Dietician

## 2013-07-17 VITALS — Ht 69.0 in | Wt 380.2 lb

## 2013-07-17 DIAGNOSIS — E669 Obesity, unspecified: Secondary | ICD-10-CM | POA: Insufficient documentation

## 2013-07-17 DIAGNOSIS — Z713 Dietary counseling and surveillance: Secondary | ICD-10-CM | POA: Insufficient documentation

## 2013-07-17 NOTE — Patient Instructions (Addendum)
Patient to call the [facility] to enroll in Pre-Op and Post-Op Nutrition Education when surgery date is scheduled. 

## 2013-07-17 NOTE — Progress Notes (Signed)
  Pre-Op Assessment Visit:  Pre-Op erative Sleeve Gastrectomy Surgery  Medical Nutrition Therapy:  Appt start time: 1000   End time:  1100.  Patient was seen on 07/17/13 for Pre-Operative Sleeve Gastrectomy Nutrition Assessment. Assessment and letter of approval faxed to [facility] B[facility] Program coordinator on 07/17/13.   Handouts given during visit include:  Pre-Op Goals Bariatric Surgery Protein Shakes List of Protein Foods (from Post Op Phase IIIA)  Patient to call the [facility] to enroll in Pre-Op and Post-Op Nutrition Education when surgery date is scheduled.

## 2013-07-18 ENCOUNTER — Ambulatory Visit: Payer: BC Managed Care – PPO | Admitting: Family Medicine

## 2013-07-24 ENCOUNTER — Other Ambulatory Visit (HOSPITAL_COMMUNITY): Payer: Self-pay | Admitting: Obstetrics

## 2013-07-24 DIAGNOSIS — N949 Unspecified condition associated with female genital organs and menstrual cycle: Secondary | ICD-10-CM

## 2013-07-24 DIAGNOSIS — Z1231 Encounter for screening mammogram for malignant neoplasm of breast: Secondary | ICD-10-CM

## 2013-08-05 ENCOUNTER — Ambulatory Visit (HOSPITAL_COMMUNITY)
Admission: RE | Admit: 2013-08-05 | Discharge: 2013-08-05 | Disposition: A | Discharge status: Home / Self Care | Payer: BC Managed Care – PPO | Source: Ambulatory Visit | Attending: Obstetrics | Admitting: Obstetrics

## 2013-08-05 DIAGNOSIS — N949 Unspecified condition associated with female genital organs and menstrual cycle: Secondary | ICD-10-CM

## 2013-08-05 DIAGNOSIS — D259 Leiomyoma of uterus, unspecified: Secondary | ICD-10-CM | POA: Insufficient documentation

## 2013-08-05 DIAGNOSIS — Z1231 Encounter for screening mammogram for malignant neoplasm of breast: Secondary | ICD-10-CM | POA: Insufficient documentation

## 2013-08-05 DIAGNOSIS — N920 Excessive and frequent menstruation with regular cycle: Secondary | ICD-10-CM | POA: Insufficient documentation

## 2013-08-08 ENCOUNTER — Encounter: Payer: Self-pay | Admitting: Family Medicine

## 2013-08-08 ENCOUNTER — Telehealth: Payer: Self-pay | Admitting: Family Medicine

## 2013-08-08 NOTE — Telephone Encounter (Signed)
Letter written on Epic,please and and fax to appropriate clinic as indicated by patient.

## 2013-08-08 NOTE — Telephone Encounter (Signed)
Will forward to MD. Tammela Bales,CMA  

## 2013-08-08 NOTE — Telephone Encounter (Signed)
Printed and faxed to [facility]. Terrelle Ruffolo,CMA

## 2013-08-08 NOTE — Telephone Encounter (Signed)
Letter written on Epic,please print and fax to appropriate clinic as indicated by patient.

## 2013-08-08 NOTE — Telephone Encounter (Signed)
Need to speak with provider regarding weight loss surgery.  Need letter that it's medically necessary for her to have this procedure due to elevated bp and ?diabetes. Please send to [facility]  ATTN:  Ms. Erskine Squibb

## 2013-08-13 ENCOUNTER — Encounter: Payer: BC Managed Care – PPO | Admitting: Family Medicine

## 2013-08-15 ENCOUNTER — Encounter: Payer: Self-pay | Admitting: Family Medicine

## 2013-08-16 ENCOUNTER — Other Ambulatory Visit: Payer: Self-pay | Admitting: Family Medicine

## 2013-08-29 ENCOUNTER — Other Ambulatory Visit: Payer: Self-pay

## 2013-11-08 ENCOUNTER — Encounter: Payer: Self-pay | Admitting: Family Medicine

## 2013-11-08 ENCOUNTER — Ambulatory Visit (INDEPENDENT_AMBULATORY_CARE_PROVIDER_SITE_OTHER): Payer: BC Managed Care – PPO | Admitting: Family Medicine

## 2013-11-08 VITALS — BP 163/92 | HR 91 | Temp 98.3°F | Ht 69.0 in | Wt 364.0 lb

## 2013-11-08 DIAGNOSIS — M545 Low back pain, unspecified: Secondary | ICD-10-CM | POA: Insufficient documentation

## 2013-11-08 DIAGNOSIS — S335XXA Sprain of ligaments of lumbar spine, initial encounter: Secondary | ICD-10-CM

## 2013-11-08 DIAGNOSIS — S39012A Strain of muscle, fascia and tendon of lower back, initial encounter: Secondary | ICD-10-CM

## 2013-11-08 MED ORDER — CYCLOBENZAPRINE HCL 5 MG PO TABS: 5.0000 mg | ORAL_TABLET | Freq: Every evening | ORAL | Status: DC | PRN

## 2013-11-08 MED ORDER — MELOXICAM 15 MG PO TABS: 15.0000 mg | ORAL_TABLET | Freq: Every day | ORAL | Status: DC

## 2013-11-08 NOTE — Patient Instructions (Signed)
Ms. Hesch,  I think that you have a strain of your lumbar muscle beside your spine and mild cause of sciatica. Please do the following:  1. Regular stretching and exercising to maintain core strength and lose weight. Your weight puts you at risk for chronic back pain.  2. Take an antiinflammatory called meloxicam each day for the next 2 weeks. You can take flexeril at night for muscle spasms.  3. Massage can help. Also Aspercreme.   Follow up in 4 weeks as needed.   Sincerely,   Dr. Erlene Senters

## 2013-11-08 NOTE — Assessment & Plan Note (Signed)
A: mild lumbar paraspinal muscle strain and possible mild sciatica P: mobic 15 mg daily x 14 days, flexeril 5 mg QHS PRN, regular exercise and stretching encouraged, f/u in 4 weeks PRN

## 2013-11-08 NOTE — Progress Notes (Signed)
   Subjective:    Patient ID: Haley Wyatt, female    DOB: [birth year], 38 y.o.   MRN: [id number]  HPI  38 year old F who presents for back pain.   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 The superior end plate deformity on the right at L3 with a cortical breech. This could be an old deformity but I can't say that it does not reflect an acute injury. Arguing against that, I don't see any paravertebral stranding or edema.   Red Flags: no weakness, no numbness and tingling, no impaired bowel or bladder function     Review of Systems Pt has not taking her BP meds in 2 days     Objective:   Physical Exam BP 163/92  Pulse 91  Temp(Src) 98.3 F (36.8 C) (Oral)  Ht 5\' 9"  (1.753 m)  Wt 364 lb (165.109 kg)  BMI 53.73 kg/m2  Back:  Appearance: sciolosis no Palpation: tenderness of paraspinal muscles yes (left sided), spinous process no; pelvis no  Flexion: good Extension: good  Neuro: Strength hip flexion 5/5, hip abduction 5/5, hip adduction 5/5,  knee extension 5/5, knee flexion 5/5, dorsiflexion 5/5, plantar flexion 5/5 bilaterally Reflexes: patella 2/2 Bilateral  Achilles 2/2 Bilateral Straight Leg Raise: negative Sensation to light touch intact: yes      Assessment & Plan:

## 2013-12-10 ENCOUNTER — Other Ambulatory Visit: Payer: Self-pay | Admitting: Family Medicine

## 2014-01-01 ENCOUNTER — Ambulatory Visit (INDEPENDENT_AMBULATORY_CARE_PROVIDER_SITE_OTHER): Payer: BC Managed Care – PPO | Admitting: Family Medicine

## 2014-01-01 ENCOUNTER — Encounter: Payer: Self-pay | Admitting: Family Medicine

## 2014-01-01 VITALS — BP 138/91 | HR 93 | Temp 98.8°F | Wt 365.0 lb

## 2014-01-01 DIAGNOSIS — H6980 Other specified disorders of Eustachian tube, unspecified ear: Secondary | ICD-10-CM

## 2014-01-01 MED ORDER — PSEUDOEPHEDRINE-GUAIFENESIN ER 60-600 MG PO TB12: 1.0000 | ORAL_TABLET | Freq: Two times a day (BID) | ORAL | Status: DC

## 2014-01-01 NOTE — Progress Notes (Signed)
   Subjective:    Patient ID: Haley Wyatt, female    DOB: [birth year], 38 y.o.   MRN: [id number]  HPI  Hearing loss and fullness Left Ear Awoke this am with this.  Mild pain.  Does not pop as does right ear  No discharge no trauma, no nasal stuffiness.   Has had before and resolved.  Feels slightly dizzy no falling or loss of consciousness   Review of Symptoms - see HPI  PMH - Smoking status noted.    Review of Systems     Objective:   Physical Exam  Alert no acute distress Neck:  No deformities, thyromegaly, masses, or tenderness noted.   Supple with full range of motion without pain. Throat: normal mucosa, no exudate, uvula midline, no redness Ears:  External ear exam shows no significant lesions or deformities.  Otoscopic examination reveals clear canals, tympanic membranes are intact bilaterally with bulging on Left but normal on R.  No inflammation or discharge.  Hearing is decreased in left ear       Assessment & Plan:

## 2014-01-01 NOTE — Assessment & Plan Note (Signed)
Acute onset but recurrent.  Will treat with decongestants and exercises.   If does not resolve to consider obstructing lesion and may need imaging

## 2014-01-01 NOTE — Patient Instructions (Addendum)
Good to see you today!  Thanks for coming in.  You have eustachian tube dysfunction blockage  Use Afrin nasal spray 2 sprays each nostril twice daily for 3 days only  Take the mucinex D twice daily for one week  If your symptoms are not better after one week then call us and we will start a nasal spray  If your are not better in 2 weeks or have fever or severe pain the come back  Do eustachian tube exercises 4 x a day - close mouth and nose and swallow several times

## 2014-01-10 ENCOUNTER — Encounter (INDEPENDENT_AMBULATORY_CARE_PROVIDER_SITE_OTHER): Payer: Self-pay | Admitting: Surgery

## 2014-01-10 ENCOUNTER — Ambulatory Visit (INDEPENDENT_AMBULATORY_CARE_PROVIDER_SITE_OTHER): Payer: BC Managed Care – PPO | Admitting: Surgery

## 2014-01-10 NOTE — Progress Notes (Signed)
Chief Complaint:  Morbid obesity previously worked up and scheduled for sleeve gastrectomy  History of Present Illness:  Haley Wyatt is an 38 y.o. female who I saw on workup for a laparoscopic sleeve gastrectomy. In the meantime she is found to have a fibroid tumor. GYN would like for her to have her bariatric surgery prior to tackling the fibroid. She's completed her preoperative workup and is ready to schedule for laparoscopic sleeve gastrectomy.her upper GI did show some evidence of gastroesophageal reflux. Will look for attendance hiatal hernia and repair at the time of sleeve gastrectomy.  Past Medical History  Diagnosis Date  . Hypertension   . Allergy   . Fibroid tumor     inside uterus    Past Surgical History  Procedure Laterality Date  . Shoulder surgery      left shoulder loose body removal  . Carpal tunnel release    . Tubal ligation      Current Outpatient Prescriptions  Medication Sig Dispense Refill  . lisinopril-hydrochlorothiazide (PRINZIDE,ZESTORETIC) 20-12.5 MG per tablet Take 1 tablet by mouth daily.  90 tablet  1  . Multiple Vitamin (MULTIVITAMIN WITH MINERALS) TABS tablet Take 1 tablet by mouth daily.      Marland Kitchen topiramate (TOPAMAX) 100 MG tablet        No current facility-administered medications for this visit.   Review of patient's allergies indicates no known allergies. Family History  Problem Relation Age of Onset  . Hyperlipidemia Father   . Hypertension Father   . Diabetes Father   . Sudden death Neg Hx   . Heart attack Neg Hx   . Cancer Mother    Social History:   reports that she has quit smoking. Her smoking use included Cigarettes. She smoked 0.00 packs per day. She does not have any smokeless tobacco history on file. She reports that she does not drink alcohol or use illicit drugs.   REVIEW OF SYSTEMS - PERTINENT POSITIVES ONLY: No DVT  Physical Exam:   Blood pressure 124/80, pulse 78, temperature 97.8 F (36.6 C), temperature source  Temporal, resp. rate 14, height 5\' 9"  (1.753 m), weight 367 lb 9.6 oz (166.742 kg). Body mass index is 54.26 kg/(m^2).  Gen:  WDWN African American female NAD  Neurological: Alert and oriented to person, place, and time. Motor and sensory function is grossly intact  Head: Normocephalic and atraumatic.  Eyes: Conjunctivae are normal. Pupils are equal, round, and reactive to light. No scleral icterus.  Neck: Normal range of motion. Neck supple. No tracheal deviation or thyromegaly present.  Cardiovascular:  SR without murmurs or gallops.  No carotid bruits Respiratory: Effort normal.  No respiratory distress. No chest wall tenderness. Breath sounds normal.  No wheezes, rales or rhonchi.  Abdomen:  Obese nontender GU: Musculoskeletal: Normal range of motion. Extremities are nontender. No cyanosis, edema or clubbing noted Lymphadenopathy: No cervical, preauricular, postauricular or axillary adenopathy is present Skin: Skin is warm and dry. No rash noted. No diaphoresis. No erythema. No pallor. Pscyh: Normal mood and affect. Behavior is normal. Judgment and thought content normal.   LABORATORY RESULTS: No results found for this or any previous visit (from the past 48 hour(s)).  RADIOLOGY RESULTS: No results found.  Problem List: Patient Active Problem List   Diagnosis Date Noted  . Eustachian tube dysfunction 01/01/2014  . Lower back pain 11/08/2013  . Vaginal discharge 07/16/2013  . Headache 07/16/2013  . Serous otitis media 07/16/2013  . Fatigue 07/16/2013  . Encounter for  routine gynecological examination 07/16/2013  . Morbid obesity 07/16/2013  . Pre-diabetes 01/31/2013  . Menorrhagia 05/23/2012  . Bacterial vaginosis 05/23/2012  . 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 Left shoulder pain 10/28/2011  . 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 HYPERTENSION 08/16/2010  . 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 LEG EDEMA, 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  . GERD 06/11/2010   . HYPOTHYROIDISM 04/16/2009  . OBESITY 04/16/2009  . DEPRESSION 03/30/2009    Assessment & Plan: Morbid obesity for sleeve gastrectomy. Patient has reflux will look at repairing hiatal hernia at the same time.    Matt B. Hassell Done, MD, [facility] [phone] beeper [phone]  01/10/2014 11:24 AM

## 2014-01-10 NOTE — Patient Instructions (Signed)
Sleeve Gastrectomy A sleeve gastrectomy is a surgery in which a large portion of the stomach is removed. After the surgery, the stomach will be a narrow tube about the size of a banana. This surgery is performed to help a person lose weight. The person loses weight because the reduced size of the stomach restricts the amount of food that the person can eat. The stomach will hold much less food than before the surgery. Also, the part of the stomach that is removed produces a hormone that causes hunger.  This surgery is done for people who have morbid obesity, defined as a body mass index (BMI) greater than 40. BMI is an estimate of body fat and is calculated from the height and weight of a person. This surgery may also be done for people with a BMI between 35 and 40 if they have other diseases, such as type 2 diabetes mellitus, obstructive sleep apnea, or heart and lung disorders (cardiopulmonary diseases).  LET YOUR HEALTH CARE PROVIDER KNOW ABOUT:  Any allergies you have.   All medicines you are taking, including vitamins, herbs, eyedrops, creams, and over-the-counter medicines.   Use of steroids (by mouth or creams).   Previous problems you or members of your family have had with the use of anesthetics.   Any blood disorders you have.   Previous surgeries you have had.   Possibility of pregnancy, if this applies.   Other health problems you have. RISKS AND COMPLICATIONS Generally, sleeve gastrectomy is a safe procedure. However, as with any procedure, complications can occur. Possible complications include:  Infection.  Bleeding.  Blood clots.  Damage to other organs or tissue.  Leakage of fluid from the stomach into the abdominal cavity (rare). BEFORE THE PROCEDURE  You may need to have blood tests and imaging tests (such as X-rays or ultrasonography) done before the day of surgery. A test to evaluate your esophagus and how it moves (esophageal manometry) may also be  done.  You may be placed on a liquid diet 2 3 weeks before the surgery.  Ask your health care provider about changing or stopping your regular medicines.  Do not eat or drink anything for at least 8 hours before the procedure.   Make plans to have someone drive you home after your hospital stay. Also arrange for someone to help you with activities during recovery. PROCEDURE  A laparoscopic technique is usually used for this surgery:  You will be given medicine to make you sleep through the procedure (general anesthetic). This medicine will be given through an intravenous (IV) access tube that is put into one of your veins.  Once you are asleep, your abdomen will be cleaned and sterilized.  Several small incisions will be made in your abdomen.  Your abdomen will be filled with air so that it expands. This gives the surgeon more room to operate and makes your organs easier to see.  A thin, lighted tube with a tiny camera on the end (laparoscope) is put through a small incision in your abdomen. The camera on the laparoscope sends a picture to a TV screen in the operating room. This gives the surgeon a good view inside the abdomen.  Hollow tubes are put through the other small incisions in your abdomen. The tools needed for the procedure are put through these tubes.  The surgeon uses staples to divide part of the stomach and then removes it through one of the incisions.  The remaining stomach may be reinforced using   stitches or surgical glue or both to prevent leakage of the stomach contents. A small tube (drain) may be placed through one of the incisions to allow extra fluid to flow from the area.  The incisions are closed with stitches, staples, or glue. AFTER THE PROCEDURE  You will be monitored closely in a recovery area. Once the anesthetic has worn off, you will likely be moved to a regular hospital room.  You will be given medicine for pain and nausea.   You may have a drain  from one of the incisions in your abdomen. If a drain is used, it may stay in place after you go home from the hospital and be removed at a follow-up appointment.   You will be encouraged to walk around several times a day. This helps prevent blood clots.  You will be started on a liquid diet the first day after your surgery. Sometimes a test is done to check for leaking before you can eat.  You will be urged to cough and do deep breathing exercises. This helps prevent a lung infection after a surgery.  You will likely need to stay in the hospital for a few days.  Document Released: 08/07/2009 Document Revised: 06/12/2013 Document Reviewed: 02/22/2013 [employer] Patient Information 2014 [employer].  

## 2014-01-20 ENCOUNTER — Telehealth: Payer: Self-pay | Admitting: Dietician

## 2014-01-20 NOTE — Telephone Encounter (Signed)
Emailed [place] the [employer] to follow for 2 weeks before bariatric surgery.

## 2014-01-22 ENCOUNTER — Ambulatory Visit (INDEPENDENT_AMBULATORY_CARE_PROVIDER_SITE_OTHER): Payer: BC Managed Care – PPO | Admitting: Family Medicine

## 2014-01-22 ENCOUNTER — Encounter: Payer: Self-pay | Admitting: Family Medicine

## 2014-01-22 VITALS — BP 153/88 | HR 96 | Temp 98.6°F | Wt 365.0 lb

## 2014-01-22 DIAGNOSIS — M545 Low back pain, unspecified: Secondary | ICD-10-CM

## 2014-01-22 MED ORDER — KETOROLAC TROMETHAMINE 60 MG/2ML IM SOLN
60.0000 mg | Freq: Once | INTRAMUSCULAR | Status: AC
  Administered 2014-01-22: 60 mg via INTRAMUSCULAR

## 2014-01-22 MED ORDER — MELOXICAM 15 MG PO TABS: 15.0000 mg | ORAL_TABLET | Freq: Every day | ORAL | Status: DC

## 2014-01-22 NOTE — Progress Notes (Signed)
   Subjective:    Patient ID: Haley Wyatt, female    DOB: 18-Mar-1976, 38 y.o.   MRN: [id number]  HPI  38 year old F who presents for back pain. She was last seen for this problem in January 2015 and diagnosed with lumbnar strain with mild sciatica. Treated conservatively with NSAIDS, flexeril, and exercising. It resolved within one week, but has recurred.    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 The superior end plate deformity on the right at L3 with a cortical breech. This could be an old deformity but I can't say that it does not reflect an acute injury. Arguing against that, I don't see any paravertebral stranding or edema.   Red Flags: no weakness, no numbness and tingling, no impaired bowel or bladder function   Current Outpatient Prescriptions on File Prior to Visit  Medication Sig Dispense Refill  . lisinopril-hydrochlorothiazide (PRINZIDE,ZESTORETIC) 20-12.5 MG per tablet Take 1 tablet by mouth daily.  90 tablet  1  . Multiple Vitamin (MULTIVITAMIN WITH MINERALS) TABS tablet Take 1 tablet by mouth daily.      Marland Kitchen topiramate (TOPAMAX) 100 MG tablet        No current facility-administered medications on file prior to visit.     Review of Systems See HPI    Objective:   Physical Exam BP 153/88  Pulse 96  Temp(Src) 98.6 F (37 C) (Oral)  Wt 365 lb (165.563 kg)  LMP 01/19/2014 Gen: middle age F, morbidly obese, in discomfort   Back:  Appearance: sciolosis  no  Palpation: tenderness of paraspinal muscles no, spinous process no; pelvis no  Flexion: good  Extension: good   Neuro:  Strength hip flexion 5/5, hip abduction 5/5, hip adduction 5/5, knee extension 5/5, knee flexion 5/5, dorsiflexion 5/5, plantar flexion 5/5 bilaterally  Reflexes: patella 0/2 Bilateral (very difficult to elicit due to body habitus) Achilles 1/2 Bilateral  Straight Leg Raise: negative  Sensation to light touch intact: yes        Assessment & Plan:

## 2014-01-22 NOTE — Patient Instructions (Signed)
Ms. Lasorsa,   I am sorry about the return of the back pain. As we discussed, please do the following things:   1. Get an X-ray of the lower back. I will let you know the results.  2. Take meloxicam daily for at least 1 week.  3. Wear cushioned tennis shoes when possible.  4. Use a lower back support pillow when seated.  5. Keep exercising! This is good for your strength and weight loss. Congratulations.   Follow up in 1 week if not improving.   Dr. Maricela Bo

## 2014-01-22 NOTE — Assessment & Plan Note (Addendum)
A: lower back pain with neuro compromise; possible lumbar degeneration P:  - ketoralac 60 mg IM today - check X-ray lumbar spine - start meloxicam since successful last time - encouraged use of lumbar support and more supportive shoes - cont exercise - f/u 1 week

## 2014-01-23 ENCOUNTER — Ambulatory Visit
Admission: RE | Admit: 2014-01-23 | Discharge: 2014-01-23 | Disposition: A | Discharge status: Home / Self Care | Payer: BC Managed Care – PPO | Source: Ambulatory Visit | Attending: Family Medicine | Admitting: Family Medicine

## 2014-01-23 ENCOUNTER — Ambulatory Visit (INDEPENDENT_AMBULATORY_CARE_PROVIDER_SITE_OTHER): Payer: BC Managed Care – PPO | Admitting: Surgery

## 2014-01-23 ENCOUNTER — Telehealth: Payer: Self-pay | Admitting: Family Medicine

## 2014-01-23 ENCOUNTER — Encounter (INDEPENDENT_AMBULATORY_CARE_PROVIDER_SITE_OTHER): Payer: Self-pay | Admitting: Surgery

## 2014-01-23 VITALS — BP 124/78 | HR 78 | Temp 98.7°F | Resp 16 | Ht 69.0 in | Wt 367.0 lb

## 2014-01-23 DIAGNOSIS — M545 Low back pain, unspecified: Secondary | ICD-10-CM

## 2014-01-23 DIAGNOSIS — E669 Obesity, unspecified: Secondary | ICD-10-CM

## 2014-01-23 MED ORDER — HYDROCODONE-ACETAMINOPHEN 7.5-325 MG/15ML PO SOLN: 10.0000 mL | Freq: Four times a day (QID) | ORAL | Status: DC | PRN

## 2014-01-23 NOTE — Patient Instructions (Signed)

## 2014-01-23 NOTE — Telephone Encounter (Signed)
LMVM for patient to call.  Please give MD msg below.  Keeven Matty, Loralyn [place], [place]

## 2014-01-23 NOTE — Addendum Note (Signed)
Addended by: Johnathan Hausen B on: 01/23/2014 11:13 AM   Modules accepted: Orders

## 2014-01-23 NOTE — Progress Notes (Signed)
Chief Complaint:  Morbid obesity previously worked up and scheduled for sleeve gastrectomy  History of Present Illness:  Haley Wyatt is an 38 y.o. female who I saw on workup for a laparoscopic sleeve gastrectomy. In the meantime she is found to have a fibroid tumor. GYN would like for her to have her bariatric surgery prior to tackling the fibroid. She's completed her preoperative workup and is ready to schedule for laparoscopic sleeve gastrectomy.her upper GI did show some evidence of gastroesophageal reflux. Will look for attendance hiatal hernia and repair at the time of sleeve gastrectomy.  Past Medical History  Diagnosis Date  . Hypertension   . Allergy   . Fibroid tumor     inside uterus    Past Surgical History  Procedure Laterality Date  . Shoulder surgery      left shoulder loose body removal  . Carpal tunnel release    . Tubal ligation      Current Outpatient Prescriptions  Medication Sig Dispense Refill  . lisinopril-hydrochlorothiazide (PRINZIDE,ZESTORETIC) 20-12.5 MG per tablet Take 1 tablet by mouth daily.  90 tablet  1  . meloxicam (MOBIC) 15 MG tablet Take 1 tablet (15 mg total) by mouth daily.  30 tablet  0  . Multiple Vitamin (MULTIVITAMIN WITH MINERALS) TABS tablet Take 1 tablet by mouth daily.       No current facility-administered medications for this visit.   Review of patient's allergies indicates no known allergies. Family History  Problem Relation Age of Onset  . Hyperlipidemia Father   . Hypertension Father   . Diabetes Father   . Sudden death Neg Hx   . Heart attack Neg Hx   . Cancer Mother    Social History:   reports that she has quit smoking. Her smoking use included Cigarettes. She smoked 0.00 packs per day. She does not have any smokeless tobacco history on file. She reports that she does not drink alcohol or use illicit drugs.   REVIEW OF SYSTEMS - PERTINENT POSITIVES ONLY: No DVT  Physical Exam:   Blood pressure 124/78, pulse 78,  temperature 98.7 F (37.1 C), temperature source Temporal, resp. rate 16, height 5\' 9"  (1.753 m), weight 367 lb (166.47 kg), last menstrual period 01/19/2014. Body mass index is 54.17 kg/(m^2).  Gen:  WDWN African American female NAD  Neurological: Alert and oriented to person, place, and time. Motor and sensory function is grossly intact  Head: Normocephalic and atraumatic.  Eyes: Conjunctivae are normal. Pupils are equal, round, and reactive to light. No scleral icterus.  Neck: Normal range of motion. Neck supple. No tracheal deviation or thyromegaly present.  Cardiovascular:  SR without murmurs or gallops.  No carotid bruits Respiratory: Effort normal.  No respiratory distress. No chest wall tenderness. Breath sounds normal.  No wheezes, rales or rhonchi.  Abdomen:  Obese nontender GU: Musculoskeletal: Normal range of motion. Extremities are nontender. No cyanosis, edema or clubbing noted Lymphadenopathy: No cervical, preauricular, postauricular or axillary adenopathy is present Skin: Skin is warm and dry. No rash noted. No diaphoresis. No erythema. No pallor. Pscyh: Normal mood and affect. Behavior is normal. Judgment and thought content normal.   LABORATORY RESULTS: No results found for this or any previous visit (from the past 48 hour(s)).  RADIOLOGY RESULTS: No results found.  Problem List: Patient Active Problem List   Diagnosis Date Noted  . Eustachian tube dysfunction 01/01/2014  . Lower back pain 11/08/2013  . Vaginal discharge 07/16/2013  . Headache 07/16/2013  . Serous  otitis media 07/16/2013  . Fatigue 07/16/2013  . Encounter for routine gynecological examination 07/16/2013  . Morbid obesity 07/16/2013  . Pre-diabetes 01/31/2013  . Menorrhagia 05/23/2012  . Bacterial vaginosis 05/23/2012  . 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 Left shoulder pain 10/28/2011  . 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 HYPERTENSION 08/16/2010  . 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 LEG EDEMA, 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  . GERD 06/11/2010  . HYPOTHYROIDISM 04/16/2009  . OBESITY 04/16/2009  . DEPRESSION 03/30/2009    Assessment & Plan: Morbid obesity for sleeve gastrectomy. Patient has reflux will look at repairing hiatal hernia at the same time.  Questions answered.  Ready for sleeve gastrectomy on Tuesday, April 21.     Matt B. Hassell Done, MD, [facility] [phone] beeper [phone]  01/23/2014 11:10 AM

## 2014-01-23 NOTE — Telephone Encounter (Signed)
Please let patient know that her back X-ray was normal. No other studies are needed at this time.

## 2014-01-27 ENCOUNTER — Telehealth: Payer: Self-pay | Admitting: Family Medicine

## 2014-01-27 NOTE — Telephone Encounter (Signed)
Please call patient back to give results of xrays

## 2014-01-27 NOTE — Telephone Encounter (Signed)
I did not order xray, but I believe she was seen by another provider and here is the report:  IMPRESSION:  Normal alignment. No acute compression deformity.  Xray is normal, please call to let patient know, I am off this afternoon. Thank you.

## 2014-01-27 NOTE — Telephone Encounter (Signed)
Will forward to MD. Jazmin Hartsell,CMA  

## 2014-01-27 NOTE — Telephone Encounter (Signed)
Called pt and unable to leave a message due to full mailbox.  Please let her know that xray was normal if she calls back.  Thanks [employer]

## 2014-01-28 ENCOUNTER — Ambulatory Visit: Payer: BC Managed Care – PPO | Admitting: Family Medicine

## 2014-01-30 ENCOUNTER — Encounter (HOSPITAL_COMMUNITY): Payer: Self-pay | Admitting: Pharmacy Technician

## 2014-01-31 NOTE — Patient Instructions (Signed)
[place]  01/31/2014   Your procedure is scheduled on:   Report to [facility] at AM.  Call this number if you have problems the morning of surgery [phone]   Remember:  Do not eat food or drink liquids :After Midnight.     Take these medicines the morning of surgery with A SIP OF WATER:                                You may not have any metal on your body including hair pins and piercings  Do not wear jewelry, make-up, lotions, powders, or deodorant.   Men may shave face and neck.  Do not bring valuables to the hospital. [place].  Contacts, dentures or bridgework may not be worn into surgery.  Leave suitcase in the car. After surgery it may be brought to your room.  For patients admitted to the hospital, checkout time is 11:00 AM the day of discharge.    [facility] - Preparing for Surgery Before surgery, you can play an important role.  Because skin is not sterile, your skin needs to be as free of germs as possible.  You can reduce the number of germs on your skin by washing with CHG (chlorahexidine gluconate) soap before surgery.  CHG is an antiseptic cleaner which kills germs and bonds with the skin to continue killing germs even after washing. Please DO NOT use if you have an allergy to CHG or antibacterial soaps.  If your skin becomes reddened/irritated stop using the CHG and inform your nurse when you arrive at Short Stay. Do not shave (including legs and underarms) for at least 48 hours prior to the first CHG shower.  You may shave your face. Please follow these instructions carefully:  1.  Shower with CHG Soap the night before surgery and the  morning of Surgery.  2.  If you choose to wash your hair, wash your hair first as usual with your  normal  shampoo.  3.  After you shampoo, rinse your hair and body thoroughly to remove the  shampoo.                           4.  Use CHG as you would any other liquid  soap.  You can apply chg directly  to the skin and wash                       Gently with a scrungie or clean washcloth.  5.  Apply the CHG Soap to your body ONLY FROM THE NECK DOWN.   Do not use on open                           Wound or open sores. Avoid contact with eyes, ears mouth and genitals (private parts).                        Genitals (private parts) with your normal soap.             6.  Wash thoroughly, paying special attention to the area where your surgery  will be performed.  7.  Thoroughly rinse your body with warm water from the neck down.  8.  DO NOT shower/wash with  your normal soap after using and rinsing off  the CHG Soap.                9.  Pat yourself dry with a clean towel.            10.  Wear clean pajamas.            11.  Place clean sheets on your bed the night of your first shower and do not  sleep with pets. Day of Surgery : Do not apply any lotions/deodorants the morning of surgery.  Please wear clean clothes to the hospital/surgery center.  FAILURE TO FOLLOW THESE INSTRUCTIONS MAY RESULT IN THE CANCELLATION OF YOUR SURGERY PATIENT SIGNATURE_________________________________  NURSE SIGNATURE__________________________________  Incentive Spirometer  An incentive spirometer is a tool that can help keep your lungs clear and active. This tool measures how well you are filling your lungs with each breath. Taking long deep breaths may help reverse or decrease the chance of developing breathing (pulmonary) problems (especially infection) following:  A long period of time when you are unable to move or be active. BEFORE THE PROCEDURE   If the spirometer includes an indicator to show your best effort, your nurse or respiratory therapist will set it to a desired goal.  If possible, sit up straight or lean slightly forward. Try not to slouch.  Hold the incentive spirometer in an upright position. INSTRUCTIONS FOR USE  1. Sit on the edge of your bed if possible, or  sit up as far as you can in bed or on a chair. 2. Hold the incentive spirometer in an upright position. 3. Breathe out normally. 4. Place the mouthpiece in your mouth and seal your lips tightly around it. 5. Breathe in slowly and as deeply as possible, raising the piston or the ball toward the top of the column. 6. Hold your breath for 3-5 seconds or for as long as possible. Allow the piston or ball to fall to the bottom of the column. 7. Remove the mouthpiece from your mouth and breathe out normally. 8. Rest for a few seconds and repeat Steps 1 through 7 at least 10 times every 1-2 hours when you are awake. Take your time and take a few normal breaths between deep breaths. 9. The spirometer may include an indicator to show your best effort. Use the indicator as a goal to work toward during each repetition. 10. After each set of 10 deep breaths, practice coughing to be sure your lungs are clear. If you have an incision (the cut made at the time of surgery), support your incision when coughing by placing a pillow or rolled up towels firmly against it. Once you are able to get out of bed, walk around indoors and cough well. You may stop using the incentive spirometer when instructed by your caregiver.  RISKS AND COMPLICATIONS  Take your time so you do not get dizzy or light-headed.  If you are in pain, you may need to take or ask for pain medication before doing incentive spirometry. It is harder to take a deep breath if you are having pain. AFTER USE  Rest and breathe slowly and easily.  It can be helpful to keep track of a log of your progress. Your caregiver can provide you with a simple table to help with this. If you are using the spirometer at home, follow these instructions: [place] IF:   You are having difficultly using the spirometer.  You have  trouble using the spirometer as often as instructed.  Your pain medication is not giving enough relief while using the  spirometer.  You develop fever of 100.5 F (38.1 C) or higher. SEEK IMMEDIATE MEDICAL CARE IF:   You cough up bloody sputum that had not been present before.  You develop fever of 102 F (38.9 C) or greater.  You develop worsening pain at or near the incision site. MAKE SURE YOU:   Understand these instructions.  Will watch your condition.  Will get help right away if you are not doing well or get worse. Document Released: 02/20/2007 Document Revised: 01/02/2012 Document Reviewed: 04/23/2007 [facility] Patient Information 2014 [place], [place].

## 2014-02-03 ENCOUNTER — Encounter: Payer: BC Managed Care – PPO | Attending: Surgery

## 2014-02-03 ENCOUNTER — Inpatient Hospital Stay (HOSPITAL_COMMUNITY)
Admission: RE | Admit: 2014-02-03 | Discharge: 2014-02-03 | Disposition: A | Discharge status: Home / Self Care | Payer: BC Managed Care – PPO | Source: Ambulatory Visit

## 2014-02-03 DIAGNOSIS — Z713 Dietary counseling and surveillance: Secondary | ICD-10-CM | POA: Insufficient documentation

## 2014-02-03 DIAGNOSIS — Z01818 Encounter for other preprocedural examination: Secondary | ICD-10-CM | POA: Insufficient documentation

## 2014-02-03 DIAGNOSIS — Z6841 Body Mass Index (BMI) 40.0 and over, adult: Secondary | ICD-10-CM | POA: Insufficient documentation

## 2014-02-04 NOTE — Patient Instructions (Addendum)
Haley Wyatt  02/04/2014                           YOUR PROCEDURE IS SCHEDULED ON: 02/11/14               PLEASE REPORT TO SHORT STAY CENTER AT : 8:00 am               CALL THIS NUMBER IF ANY PROBLEMS THE DAY OF SURGERY :               [phone]                                REMEMBER:   Do not eat food or drink liquids AFTER MIDNIGHT                 Take these medicines the morning of surgery with A SIP OF WATER: NONE   Do not wear jewelry, make-up   Do not wear lotions, powders, or perfumes.   Do not shave legs or underarms 12 hrs. before surgery (men may shave face)  Do not bring valuables to the hospital.  Contacts, dentures or bridgework may not be worn into surgery.  Leave suitcase in the car. After surgery it may be brought to your room.  For patients admitted to the hospital more than one night, checkout time is            11:00 AM                                                       STOP ASPIRIN / HERBAL MEDS / NSAIDS (IBUPROFEN, MOTRIN, ALEVE) [place] - Preparing for Surgery Before surgery, you can play an important role.  Because skin is not sterile, your skin needs to be as free of germs as possible.  You can reduce the number of germs on your skin by washing with CHG (chlorahexidine gluconate) soap before surgery.  CHG is an antiseptic cleaner which kills germs and bonds with the skin to continue killing germs even after washing. Please DO NOT use if you have an allergy to CHG or antibacterial soaps.  If your skin becomes reddened/irritated stop using the CHG and inform your nurse when you arrive at Short Stay. Do not shave (including legs and underarms) for at least 48 hours prior to the first CHG shower.  You may shave your face. Please follow these instructions carefully:  1.  Shower with CHG Soap the night before surgery and the  morning of Surgery.   2.  If you choose  to wash your hair, wash your hair first as usual with your  normal  Shampoo.   3.  After you shampoo, rinse your hair and body thoroughly to remove the  shampoo.  4.  Use CHG as you would any other liquid soap.  You can apply chg directly  to the skin and wash . Gently wash with scrungie or clean wascloth    5.  Apply the CHG Soap to your body ONLY FROM THE NECK DOWN.   Do not use on open                           Wound or open sores. Avoid contact with eyes, ears mouth and genitals (private parts).                        Genitals (private parts) with your normal soap.              6.  Wash thoroughly, paying special attention to the area where your surgery  will be performed.   7.  Thoroughly rinse your body with warm water from the neck down.   8.  DO NOT shower/wash with your normal soap after using and rinsing off  the CHG Soap .                9.  Pat yourself dry with a clean towel.             10.  Wear clean pajamas.             11.  Place clean sheets on your bed the night of your first shower and do not  sleep with pets.  Day of Surgery : Do not apply any lotions/deodorants the morning of surgery.  Please wear clean clothes to the hospital/surgery center.  FAILURE TO FOLLOW THESE INSTRUCTIONS MAY RESULT IN THE CANCELLATION OF YOUR SURGERY    PATIENT SIGNATURE_________________________________

## 2014-02-05 ENCOUNTER — Emergency Department (HOSPITAL_COMMUNITY)
Admission: EM | Admit: 2014-02-05 | Discharge: 2014-02-05 | Disposition: A | Discharge status: Home / Self Care | Payer: BC Managed Care – PPO | Attending: Emergency Medicine | Admitting: Emergency Medicine

## 2014-02-05 ENCOUNTER — Encounter (HOSPITAL_COMMUNITY): Payer: Self-pay | Admitting: Pharmacy Technician

## 2014-02-05 ENCOUNTER — Encounter (HOSPITAL_COMMUNITY): Payer: Self-pay

## 2014-02-05 ENCOUNTER — Encounter (HOSPITAL_COMMUNITY)
Admission: RE | Admit: 2014-02-05 | Discharge: 2014-02-05 | Disposition: A | Discharge status: Home / Self Care | Payer: BC Managed Care – PPO | Source: Ambulatory Visit | Attending: Surgery | Admitting: Surgery

## 2014-02-05 ENCOUNTER — Encounter (HOSPITAL_COMMUNITY): Payer: Self-pay | Admitting: Emergency Medicine

## 2014-02-05 ENCOUNTER — Encounter: Payer: Self-pay | Admitting: Family Medicine

## 2014-02-05 DIAGNOSIS — Z791 Long term (current) use of non-steroidal anti-inflammatories (NSAID): Secondary | ICD-10-CM | POA: Insufficient documentation

## 2014-02-05 DIAGNOSIS — E669 Obesity, unspecified: Secondary | ICD-10-CM | POA: Insufficient documentation

## 2014-02-05 DIAGNOSIS — Z79899 Other long term (current) drug therapy: Secondary | ICD-10-CM | POA: Insufficient documentation

## 2014-02-05 DIAGNOSIS — M545 Low back pain, unspecified: Secondary | ICD-10-CM | POA: Insufficient documentation

## 2014-02-05 DIAGNOSIS — Z8719 Personal history of other diseases of the digestive system: Secondary | ICD-10-CM | POA: Insufficient documentation

## 2014-02-05 DIAGNOSIS — M533 Sacrococcygeal disorders, not elsewhere classified: Secondary | ICD-10-CM | POA: Insufficient documentation

## 2014-02-05 DIAGNOSIS — I10 Essential (primary) hypertension: Secondary | ICD-10-CM | POA: Insufficient documentation

## 2014-02-05 DIAGNOSIS — Z8742 Personal history of other diseases of the female genital tract: Secondary | ICD-10-CM | POA: Insufficient documentation

## 2014-02-05 DIAGNOSIS — Z87891 Personal history of nicotine dependence: Secondary | ICD-10-CM | POA: Insufficient documentation

## 2014-02-05 HISTORY — DX: Dorsalgia, unspecified: M54.9

## 2014-02-05 HISTORY — DX: Prediabetes: R73.03

## 2014-02-05 HISTORY — DX: Personal history of other diseases of the digestive system: Z87.19

## 2014-02-05 HISTORY — DX: Gastro-esophageal reflux disease without esophagitis: K21.9

## 2014-02-05 LAB — CBC WITH DIFFERENTIAL/PLATELET
Basophils Absolute: 0 10*3/uL (ref 0.0–0.1)
Basophils Relative: 0 % (ref 0–1)
Eosinophils Absolute: 0.1 10*3/uL (ref 0.0–0.7)
Eosinophils Relative: 1 % (ref 0–5)
HEMATOCRIT: 37.7 % (ref 36.0–46.0)
HEMOGLOBIN: 12.4 g/dL (ref 12.0–15.0)
LYMPHS ABS: 2.7 10*3/uL (ref 0.7–4.0)
LYMPHS PCT: 34 % (ref 12–46)
MCH: 26.7 pg (ref 26.0–34.0)
MCHC: 32.9 g/dL (ref 30.0–36.0)
MCV: 81.3 fL (ref 78.0–100.0)
MONO ABS: 0.6 10*3/uL (ref 0.1–1.0)
MONOS PCT: 7 % (ref 3–12)
NEUTROS ABS: 4.6 10*3/uL (ref 1.7–7.7)
Neutrophils Relative %: 57 % (ref 43–77)
Platelets: 255 10*3/uL (ref 150–400)
RBC: 4.64 MIL/uL (ref 3.87–5.11)
RDW: 14.6 % (ref 11.5–15.5)
WBC: 8 10*3/uL (ref 4.0–10.5)

## 2014-02-05 LAB — COMPREHENSIVE METABOLIC PANEL
ALT: 14 U/L (ref 0–35)
AST: 14 U/L (ref 0–37)
Albumin: 3.4 g/dL — ABNORMAL LOW (ref 3.5–5.2)
Alkaline Phosphatase: 67 U/L (ref 39–117)
BILIRUBIN TOTAL: 0.3 mg/dL (ref 0.3–1.2)
BUN: 12 mg/dL (ref 6–23)
CHLORIDE: 103 meq/L (ref 96–112)
CO2: 27 meq/L (ref 19–32)
CREATININE: 0.78 mg/dL (ref 0.50–1.10)
Calcium: 9.6 mg/dL (ref 8.4–10.5)
GLUCOSE: 104 mg/dL — AB (ref 70–99)
Potassium: 4.3 mEq/L (ref 3.7–5.3)
Sodium: 140 mEq/L (ref 137–147)
Total Protein: 7.4 g/dL (ref 6.0–8.3)

## 2014-02-05 MED ORDER — OXYCODONE-ACETAMINOPHEN 5-325 MG PO TABS: 2.0000 | ORAL_TABLET | Freq: Once | ORAL | Status: DC

## 2014-02-05 MED ORDER — OXYCODONE HCL 5 MG PO TABS
10.0000 mg | ORAL_TABLET | Freq: Once | ORAL | Status: AC
  Administered 2014-02-05: 10 mg via ORAL
  Filled 2014-02-05: qty 2

## 2014-02-05 MED ORDER — HYDROCODONE-ACETAMINOPHEN 5-325 MG PO TABS: 2.0000 | ORAL_TABLET | ORAL | Status: DC | PRN

## 2014-02-05 NOTE — ED Notes (Signed)
Pt presents to department for evaluation of lower back pain. Ongoing for several days. Was seen by PCP on Friday and had x-rays, states results were negative. States pain continues to become worse. No relief with medications at home. 8/10 pain upon arrival. Denies urinary symptoms. States gastric sleeve surgery scheduled for this week.

## 2014-02-05 NOTE — Discharge Instructions (Signed)
Call for a follow up appointment with a Family or Primary Care Provider.  Return if Symptoms worsen.   Take medication as prescribed.  Use your pain medication as prescribed and do not operate heavy machinery while on pain medication. Note that your pain medication contains acetaminophen (Tylenol) & its is not reccommended that you use additional acetaminophen (Tylenol) while taking this medication.

## 2014-02-05 NOTE — ED Notes (Signed)
MD at bedside. 

## 2014-02-05 NOTE — Progress Notes (Signed)
  Pre-Operative Nutrition Class:  Appt start time: 7622   End time:  1830.  Patient was seen on 02/03/2014 for Pre-Operative Bariatric Surgery Education at [facility].   Surgery date: 02/11/2014 Surgery type: Gastric Sleeve Start weight at [facility]: 380 on 07/17/13 Weight today: 370 lbs  TANITA  BODY COMP RESULTS  02/03/14   BMI (kg/m^2) 54.6   Fat Mass (lbs) 194.5   Fat Free Mass (lbs) 175.5   Total Body Water (lbs) 128.5   Samples given per MNT protocol. Patient educated on appropriate usage: Bariactiv Multivitamin (Qty 1) Lot #: [id number] S Exp: 02/2015  Bariatric Advantage Calcium Citrate (cherry - Qty 1) Lot #: [id number] Exp: 07/2014  Premier protein shake (chocolate - Qty 1) Lot #: [id number] Exp: 10/2014  Renee Pain Protein Powder (unflavored - Qty 1) Lot #: [id number] Exp: 01/2015  The following the learning objectives were met by the patient during this course:  Identify Pre-Op Dietary Goals and will begin 2 weeks pre-operatively  Identify appropriate sources of fluids and proteins   State protein recommendations and appropriate sources pre and post-operatively  Identify Post-Operative Dietary Goals and will follow for 2 weeks post-operatively  Identify appropriate multivitamin and calcium sources  Describe the need for physical activity post-operatively and will follow MD recommendations  State when to call healthcare provider regarding medication questions or post-operative complications  Handouts given during class include:  Pre-Op Bariatric Surgery Diet Handout  Protein Shake Handout  Post-Op Bariatric Surgery Nutrition Handout  BELT Program Information Flyer  Support Group Information Flyer  [facility] Bariatric Supplements Price List  Follow-Up Plan: Patient will follow-up at [facility] 2 weeks post operatively for diet advancement per MD.

## 2014-02-05 NOTE — ED Provider Notes (Signed)
CSN: [id number]     Arrival date & time 02/05/14  2205 History   First MD Initiated Contact with Patient 02/05/14 2240     Chief Complaint  Patient presents with  . Back Pain     (Consider location/radiation/quality/duration/timing/severity/associated sxs/prior Treatment) HPI Comments: Haley Wyatt is a 38 y.o. female with a past medical history of obesity, back pain, HTN presenting the Emergency Department with a chief complaint of back pain.  The patient reports low back and tailbone pain. Worsened with pressure and movement.  She reports being evaluated her PCP and had negative XR at that time.  She reports she was prescribed mobic but can't take due to gastric surgery next week.  She reports tylenol use without resolution of symptoms.  Denies radiation to lower extremities, urinary symptoms, abdominal pain, fever or chills.  Denies history of pilonidal cyst.   The history is provided by the patient. No language interpreter was used.    Past Medical History  Diagnosis Date  . Hypertension   . Allergy   . Fibroid tumor     inside uterus  . Back pain     "muscle strain from exercising"  . GERD (gastroesophageal reflux disease)   . H/O hiatal hernia   . Borderline diabetes     states "levels have been about 102"   Past Surgical History  Procedure Laterality Date  . Shoulder surgery      left shoulder loose body removal  . Carpal tunnel release    . Tubal ligation     Family History  Problem Relation Age of Onset  . Hyperlipidemia Father   . Hypertension Father   . Diabetes Father   . Sudden death Neg Hx   . Heart attack Neg Hx   . Cancer Mother    History  Substance Use Topics  . Smoking status: Former Smoker    Types: Cigarettes    Quit date: 02/05/2006  . Smokeless tobacco: Not on file  . Alcohol Use: No   OB History   Grav Para Term Preterm Abortions TAB SAB Ect Mult Living                 Review of Systems  Constitutional: Negative for fever and  chills.  Gastrointestinal: Negative for abdominal pain.  Genitourinary: Negative for dysuria.  Musculoskeletal: Positive for back pain.  Skin: Negative for rash.  Neurological: Negative for weakness and numbness.  All other systems reviewed and are negative.     Allergies  Review of patient's allergies indicates no known allergies.  Home Medications   Prior to Admission medications   Medication Sig Start Date End Date Taking? Authorizing Provider  acetaminophen (TYLENOL) 500 MG tablet Take 1,000 mg by mouth 3 (three) times daily as needed for moderate pain.   Yes Historical Provider, MD  lisinopril-hydrochlorothiazide (PRINZIDE,ZESTORETIC) 20-12.5 MG per tablet Take 1 tablet by mouth every morning.   Yes Historical Provider, MD  meloxicam (MOBIC) 15 MG tablet Take 15 mg by mouth daily.   Yes Historical Provider, MD  HYDROcodone-acetaminophen (HYCET) 7.5-325 mg/15 ml solution Take 10 mLs by mouth 4 (four) times daily as needed for moderate pain. 01/23/14   Pedro Earls, MD   BP 137/92  Pulse 90  Temp(Src) 98.9 F (37.2 C) (Oral)  Resp 18  SpO2 100%  LMP 01/19/2014 Physical Exam  Nursing note and vitals reviewed. Constitutional: She is oriented to person, place, and time. She appears well-developed and well-nourished. No distress.  HENT:  Head: Normocephalic and atraumatic.  Eyes: Pupils are equal, round, and reactive to light.  Neck: Normal range of motion. Neck supple.  Pulmonary/Chest: Effort normal. No respiratory distress.  Musculoskeletal: Normal range of motion.       Lumbar back: She exhibits tenderness. She exhibits no swelling and no edema.       Back:  Tenderness to palpation of L-spine, no step-offs, crepitus, or deformities noted. Moves bilateral lower extremities, no decrease sensation. No obvious abscess or erythema.   Neurological: She is alert and oriented to person, place, and time. No sensory deficit. She exhibits normal muscle tone.  Skin: Skin is warm  and dry. No rash noted. She is not diaphoretic.  Psychiatric: She has a normal mood and affect. Her behavior is normal.    ED Course  Procedures (including critical care time) Labs Review Labs Reviewed - No data to display  Imaging Review No results found.   EKG Interpretation None      MDM   Final diagnoses:  Low back pain   Pt with low back pain, negative XR at PCP this week for similar complaints.  Reports gastric surgery and is unable to take NSAIDs.  No neuro deficits on exam, no obvious abscess, no concern for cauda equina. Discussed treatment plan with the patient. Return precautions given. Reports understanding and no other concerns at this time.  Patient is stable for discharge at this time.  Meds given in ED:  Medications  oxyCODONE (Oxy IR/ROXICODONE) immediate release tablet 10 mg (10 mg Oral Given 02/05/14 2320)    New Prescriptions   HYDROCODONE-ACETAMINOPHEN (NORCO/VICODIN) 5-325 MG PER TABLET    Take 2 tablets by mouth every 4 (four) hours as needed.        Lorrine Kin, PA-C 02/06/14 0005

## 2014-02-06 NOTE — ED Provider Notes (Signed)
Medical screening examination/treatment/procedure(s) were performed by non-physician practitioner and as supervising physician I was immediately available for consultation/collaboration.   EKG Interpretation None        Delice Bison Ward, DO 02/06/14 0211

## 2014-02-10 ENCOUNTER — Encounter (INDEPENDENT_AMBULATORY_CARE_PROVIDER_SITE_OTHER): Payer: Self-pay | Admitting: Surgery

## 2014-02-11 ENCOUNTER — Encounter (HOSPITAL_COMMUNITY)
Admission: RE | Disposition: A | Discharge status: Home / Self Care | Payer: Self-pay | Source: Ambulatory Visit | Attending: Surgery

## 2014-02-11 ENCOUNTER — Encounter (HOSPITAL_COMMUNITY): Payer: Self-pay | Admitting: Anesthesiology

## 2014-02-11 ENCOUNTER — Encounter (HOSPITAL_COMMUNITY): Payer: BC Managed Care – PPO | Admitting: Anesthesiology

## 2014-02-11 ENCOUNTER — Ambulatory Visit (HOSPITAL_COMMUNITY): Payer: BC Managed Care – PPO | Admitting: Anesthesiology

## 2014-02-11 ENCOUNTER — Inpatient Hospital Stay (HOSPITAL_COMMUNITY)
Admission: RE | Admit: 2014-02-11 | Discharge: 2014-02-13 | DRG: 621 | Disposition: A | Discharge status: Home / Self Care | Payer: BC Managed Care – PPO | Source: Ambulatory Visit | Attending: Surgery | Admitting: Surgery

## 2014-02-11 DIAGNOSIS — Z833 Family history of diabetes mellitus: Secondary | ICD-10-CM

## 2014-02-11 DIAGNOSIS — D259 Leiomyoma of uterus, unspecified: Secondary | ICD-10-CM | POA: Diagnosis present

## 2014-02-11 DIAGNOSIS — Z6841 Body Mass Index (BMI) 40.0 and over, adult: Secondary | ICD-10-CM

## 2014-02-11 DIAGNOSIS — Z79899 Other long term (current) drug therapy: Secondary | ICD-10-CM

## 2014-02-11 DIAGNOSIS — Z8249 Family history of ischemic heart disease and other diseases of the circulatory system: Secondary | ICD-10-CM

## 2014-02-11 DIAGNOSIS — Z9884 Bariatric surgery status: Secondary | ICD-10-CM

## 2014-02-11 DIAGNOSIS — K449 Diaphragmatic hernia without obstruction or gangrene: Secondary | ICD-10-CM | POA: Diagnosis present

## 2014-02-11 DIAGNOSIS — Z87891 Personal history of nicotine dependence: Secondary | ICD-10-CM

## 2014-02-11 DIAGNOSIS — K219 Gastro-esophageal reflux disease without esophagitis: Secondary | ICD-10-CM | POA: Diagnosis present

## 2014-02-11 DIAGNOSIS — I10 Essential (primary) hypertension: Secondary | ICD-10-CM | POA: Diagnosis present

## 2014-02-11 HISTORY — PX: LAPAROSCOPIC GASTRIC SLEEVE RESECTION: SHX5895

## 2014-02-11 HISTORY — PX: UPPER GI ENDOSCOPY: SHX6162

## 2014-02-11 HISTORY — PX: HIATAL HERNIA REPAIR: SHX195

## 2014-02-11 LAB — HEMOGLOBIN AND HEMATOCRIT, BLOOD
HCT: 37.4 % (ref 36.0–46.0)
HEMOGLOBIN: 12.2 g/dL (ref 12.0–15.0)

## 2014-02-11 LAB — CREATININE, SERUM
Creatinine, Ser: 0.89 mg/dL (ref 0.50–1.10)
GFR calc non Af Amer: 81 mL/min — ABNORMAL LOW (ref >90)

## 2014-02-11 LAB — PREGNANCY, URINE: PREG TEST UR: NEGATIVE

## 2014-02-11 LAB — CBC
HCT: 37.2 % (ref 36.0–46.0)
Hemoglobin: 12.3 g/dL (ref 12.0–15.0)
MCH: 26.8 pg (ref 26.0–34.0)
MCHC: 33.1 g/dL (ref 30.0–36.0)
MCV: 81 fL (ref 78.0–100.0)
PLATELETS: 277 10*3/uL (ref 150–400)
RBC: 4.59 MIL/uL (ref 3.87–5.11)
RDW: 14.8 % (ref 11.5–15.5)
WBC: 17.1 10*3/uL — AB (ref 4.0–10.5)

## 2014-02-11 SURGERY — GASTRECTOMY, SLEEVE, LAPAROSCOPIC
Anesthesia: General

## 2014-02-11 MED ORDER — PROPOFOL 10 MG/ML IV BOLUS
INTRAVENOUS | Status: AC
  Filled 2014-02-11: qty 20

## 2014-02-11 MED ORDER — GLYCOPYRROLATE 0.2 MG/ML IJ SOLN
INTRAMUSCULAR | Status: AC
  Filled 2014-02-11: qty 4

## 2014-02-11 MED ORDER — DEXTROSE 5 % IV SOLN
2.0000 g | INTRAVENOUS | Status: AC
  Administered 2014-02-11: 2 g via INTRAVENOUS

## 2014-02-11 MED ORDER — DEXAMETHASONE SODIUM PHOSPHATE 10 MG/ML IJ SOLN
INTRAMUSCULAR | Status: AC
  Filled 2014-02-11: qty 1

## 2014-02-11 MED ORDER — UNJURY CHICKEN SOUP POWDER: 2.0000 [oz_av] | Freq: Four times a day (QID) | ORAL | Status: DC

## 2014-02-11 MED ORDER — HYDROCODONE-ACETAMINOPHEN 7.5-325 MG/15ML PO SOLN: 10.0000 mL | Freq: Four times a day (QID) | ORAL | Status: DC | PRN

## 2014-02-11 MED ORDER — SUCCINYLCHOLINE CHLORIDE 20 MG/ML IJ SOLN
INTRAMUSCULAR | Status: DC | PRN
  Administered 2014-02-11 (×2): 100 mg via INTRAVENOUS

## 2014-02-11 MED ORDER — ONDANSETRON HCL 4 MG/2ML IJ SOLN
INTRAMUSCULAR | Status: AC
  Filled 2014-02-11: qty 2

## 2014-02-11 MED ORDER — BUPIVACAINE LIPOSOME 1.3 % IJ SUSP
INTRAMUSCULAR | Status: DC | PRN
  Administered 2014-02-11: 20 mL

## 2014-02-11 MED ORDER — PROMETHAZINE HCL 25 MG/ML IJ SOLN
6.2500 mg | INTRAMUSCULAR | Status: DC | PRN
  Administered 2014-02-11: 6.25 mg via INTRAVENOUS

## 2014-02-11 MED ORDER — GLYCOPYRROLATE 0.2 MG/ML IJ SOLN
INTRAMUSCULAR | Status: DC | PRN
  Administered 2014-02-11: .8 mg via INTRAVENOUS

## 2014-02-11 MED ORDER — CEFOXITIN SODIUM 2 G IV SOLR
INTRAVENOUS | Status: AC
  Filled 2014-02-11: qty 2

## 2014-02-11 MED ORDER — PROPOFOL 10 MG/ML IV BOLUS
INTRAVENOUS | Status: DC | PRN
  Administered 2014-02-11: 200 mg via INTRAVENOUS

## 2014-02-11 MED ORDER — PHENYLEPHRINE 40 MCG/ML (10ML) SYRINGE FOR IV PUSH (FOR BLOOD PRESSURE SUPPORT)
PREFILLED_SYRINGE | INTRAVENOUS | Status: AC
  Filled 2014-02-11: qty 10

## 2014-02-11 MED ORDER — HYDROMORPHONE HCL PF 1 MG/ML IJ SOLN
0.2500 mg | INTRAMUSCULAR | Status: DC | PRN
  Administered 2014-02-11: 0.5 mg via INTRAVENOUS

## 2014-02-11 MED ORDER — PROMETHAZINE HCL 25 MG/ML IJ SOLN
INTRAMUSCULAR | Status: AC
  Filled 2014-02-11: qty 1

## 2014-02-11 MED ORDER — BUPIVACAINE LIPOSOME 1.3 % IJ SUSP
20.0000 mL | Freq: Once | INTRAMUSCULAR | Status: DC
  Filled 2014-02-11: qty 20

## 2014-02-11 MED ORDER — FENTANYL CITRATE 0.05 MG/ML IJ SOLN
INTRAMUSCULAR | Status: DC | PRN
Start: 2014-02-11 — End: 2014-02-11
  Administered 2014-02-11: 100 ug via INTRAVENOUS
  Administered 2014-02-11 (×2): 50 ug via INTRAVENOUS

## 2014-02-11 MED ORDER — TISSEEL VH 10 ML EX KIT
PACK | CUTANEOUS | Status: DC | PRN
  Administered 2014-02-11: 1

## 2014-02-11 MED ORDER — CHLORHEXIDINE GLUCONATE CLOTH 2 % EX PADS: 6.0000 | MEDICATED_PAD | Freq: Once | CUTANEOUS | Status: DC

## 2014-02-11 MED ORDER — CISATRACURIUM BESYLATE 20 MG/10ML IV SOLN
INTRAVENOUS | Status: AC
  Filled 2014-02-11: qty 10

## 2014-02-11 MED ORDER — UNJURY CHOCOLATE CLASSIC POWDER: 2.0000 [oz_av] | Freq: Four times a day (QID) | ORAL | Status: DC

## 2014-02-11 MED ORDER — DEXAMETHASONE SODIUM PHOSPHATE 10 MG/ML IJ SOLN
INTRAMUSCULAR | Status: DC | PRN
  Administered 2014-02-11: 10 mg via INTRAVENOUS

## 2014-02-11 MED ORDER — KCL IN DEXTROSE-NACL 20-5-0.45 MEQ/L-%-% IV SOLN
INTRAVENOUS | Status: DC
Start: 2014-02-11 — End: 2014-02-13
  Administered 2014-02-11 (×3): via INTRAVENOUS
  Administered 2014-02-12: 100 mL via INTRAVENOUS
  Administered 2014-02-12: 10:00:00 via INTRAVENOUS
  Administered 2014-02-13: 100 mL via INTRAVENOUS
  Filled 2014-02-11 (×6): qty 1000

## 2014-02-11 MED ORDER — LACTATED RINGERS IV SOLN
INTRAVENOUS | Status: DC | PRN
  Administered 2014-02-11: 3000 mL via INTRAVENOUS

## 2014-02-11 MED ORDER — HYDROMORPHONE HCL PF 1 MG/ML IJ SOLN
INTRAMUSCULAR | Status: AC
  Filled 2014-02-11: qty 1

## 2014-02-11 MED ORDER — ONDANSETRON HCL 4 MG/2ML IJ SOLN: 4.0000 mg | INTRAMUSCULAR | Status: DC | PRN

## 2014-02-11 MED ORDER — 0.9 % SODIUM CHLORIDE (POUR BTL) OPTIME
TOPICAL | Status: DC | PRN
  Administered 2014-02-11: 1000 mL

## 2014-02-11 MED ORDER — ONDANSETRON HCL 4 MG/2ML IJ SOLN
INTRAMUSCULAR | Status: DC | PRN
  Administered 2014-02-11: 4 mg via INTRAVENOUS

## 2014-02-11 MED ORDER — HEPARIN SODIUM (PORCINE) 5000 UNIT/ML IJ SOLN
5000.0000 [IU] | INTRAMUSCULAR | Status: AC
  Administered 2014-02-11: 5000 [IU] via SUBCUTANEOUS
  Filled 2014-02-11: qty 1

## 2014-02-11 MED ORDER — MIDAZOLAM HCL 2 MG/2ML IJ SOLN
INTRAMUSCULAR | Status: AC
  Filled 2014-02-11: qty 2

## 2014-02-11 MED ORDER — METOCLOPRAMIDE HCL 5 MG/ML IJ SOLN
INTRAMUSCULAR | Status: AC
  Filled 2014-02-11: qty 2

## 2014-02-11 MED ORDER — UNJURY VANILLA POWDER
2.0000 [oz_av] | Freq: Four times a day (QID) | ORAL | Status: DC
  Administered 2014-02-13: 2 [oz_av] via ORAL

## 2014-02-11 MED ORDER — NEOSTIGMINE METHYLSULFATE 1 MG/ML IJ SOLN
INTRAMUSCULAR | Status: AC
  Filled 2014-02-11: qty 10

## 2014-02-11 MED ORDER — ACETAMINOPHEN 160 MG/5ML PO SOLN: 325.0000 mg | ORAL | Status: DC | PRN

## 2014-02-11 MED ORDER — MIDAZOLAM HCL 5 MG/5ML IJ SOLN
INTRAMUSCULAR | Status: DC | PRN
  Administered 2014-02-11: 2 mg via INTRAVENOUS

## 2014-02-11 MED ORDER — MORPHINE SULFATE 2 MG/ML IJ SOLN
2.0000 mg | INTRAMUSCULAR | Status: DC | PRN
  Administered 2014-02-11: 4 mg via INTRAVENOUS
  Administered 2014-02-11: 6 mg via INTRAVENOUS
  Administered 2014-02-11: 2 mg via INTRAVENOUS
  Administered 2014-02-11: 6 mg via INTRAVENOUS
  Administered 2014-02-11: 2 mg via INTRAVENOUS
  Administered 2014-02-12 (×7): 6 mg via INTRAVENOUS
  Administered 2014-02-13 (×2): 4 mg via INTRAVENOUS
  Filled 2014-02-11 (×6): qty 3
  Filled 2014-02-11: qty 1
  Filled 2014-02-11: qty 2
  Filled 2014-02-11 (×2): qty 3
  Filled 2014-02-11 (×2): qty 2
  Filled 2014-02-11: qty 3
  Filled 2014-02-11: qty 1

## 2014-02-11 MED ORDER — PHENYLEPHRINE HCL 10 MG/ML IJ SOLN
INTRAMUSCULAR | Status: DC | PRN
  Administered 2014-02-11 (×2): 80 ug via INTRAVENOUS

## 2014-02-11 MED ORDER — HEPARIN SODIUM (PORCINE) 5000 UNIT/ML IJ SOLN
5000.0000 [IU] | Freq: Three times a day (TID) | INTRAMUSCULAR | Status: DC
  Administered 2014-02-11 – 2014-02-13 (×5): 5000 [IU] via SUBCUTANEOUS
  Filled 2014-02-11 (×8): qty 1

## 2014-02-11 MED ORDER — KCL IN DEXTROSE-NACL 20-5-0.45 MEQ/L-%-% IV SOLN
INTRAVENOUS | Status: AC
  Filled 2014-02-11: qty 1000

## 2014-02-11 MED ORDER — CISATRACURIUM BESYLATE (PF) 10 MG/5ML IV SOLN
INTRAVENOUS | Status: DC | PRN
  Administered 2014-02-11: 4 mg via INTRAVENOUS
  Administered 2014-02-11: 6 mg via INTRAVENOUS

## 2014-02-11 MED ORDER — OXYCODONE HCL 5 MG/5ML PO SOLN
5.0000 mg | ORAL | Status: DC | PRN
  Administered 2014-02-13 (×2): 10 mg via ORAL
  Filled 2014-02-11 (×2): qty 10

## 2014-02-11 MED ORDER — METOCLOPRAMIDE HCL 5 MG/ML IJ SOLN
INTRAMUSCULAR | Status: DC | PRN
  Administered 2014-02-11: 10 mg via INTRAVENOUS

## 2014-02-11 MED ORDER — TISSEEL VH 10 ML EX KIT
PACK | CUTANEOUS | Status: AC
  Filled 2014-02-11: qty 2

## 2014-02-11 MED ORDER — FENTANYL CITRATE 0.05 MG/ML IJ SOLN
INTRAMUSCULAR | Status: AC
  Filled 2014-02-11: qty 5

## 2014-02-11 MED ORDER — LACTATED RINGERS IV SOLN
INTRAVENOUS | Status: DC | PRN
  Administered 2014-02-11 (×2): via INTRAVENOUS

## 2014-02-11 MED ORDER — ACETAMINOPHEN 160 MG/5ML PO SOLN: 650.0000 mg | ORAL | Status: DC | PRN

## 2014-02-11 MED ORDER — NEOSTIGMINE METHYLSULFATE 1 MG/ML IJ SOLN
INTRAMUSCULAR | Status: DC | PRN
  Administered 2014-02-11: 5 mg via INTRAVENOUS

## 2014-02-11 SURGICAL SUPPLY — 103 items
ADH SKN CLS APL DERMABOND .7 (GAUZE/BANDAGES/DRESSINGS) ×2
APL SKNCLS STERI-STRIP NONHPOA (GAUZE/BANDAGES/DRESSINGS) ×2
APL SRG 32X5 SNPLK LF DISP (MISCELLANEOUS) ×2
APPLICATOR COTTON TIP 6IN STRL (MISCELLANEOUS) IMPLANT
APPLIER CLIP ROT 10 11.4 M/L (STAPLE)
APPLIER CLIP ROT 13.4 12 LRG (CLIP) ×4
APR CLP LRG 13.4X12 ROT 20 MLT (CLIP) ×2
APR CLP MED LRG 11.4X10 (STAPLE)
BENZOIN TINCTURE PRP APPL 2/3 (GAUZE/BANDAGES/DRESSINGS) ×4 IMPLANT
BLADE HEX COATED 2.75 (ELECTRODE) ×4 IMPLANT
BLADE SURG 15 STRL LF DISP TIS (BLADE) ×2 IMPLANT
BLADE SURG 15 STRL SS (BLADE) ×4
CABLE HIGH FREQUENCY MONO STRZ (ELECTRODE) IMPLANT
CANISTER SUCTION 2500CC (MISCELLANEOUS) ×2 IMPLANT
CLAMP ENDO BABCK 10MM (STAPLE) IMPLANT
CLIP APPLIE ROT 10 11.4 M/L (STAPLE) IMPLANT
CLIP APPLIE ROT 13.4 12 LRG (CLIP) IMPLANT
CLOSURE WOUND 1/2 X4 (GAUZE/BANDAGES/DRESSINGS)
COVER SURGICAL LIGHT HANDLE (MISCELLANEOUS) ×2 IMPLANT
DECANTER SPIKE VIAL GLASS SM (MISCELLANEOUS) ×4 IMPLANT
DERMABOND ADVANCED (GAUZE/BANDAGES/DRESSINGS) ×2
DERMABOND ADVANCED .7 DNX12 (GAUZE/BANDAGES/DRESSINGS) IMPLANT
DEVICE SUT QUICK LOAD TK 5 (STAPLE) ×1 IMPLANT
DEVICE SUT TI-KNOT TK 5X26 (MISCELLANEOUS) ×2 IMPLANT
DEVICE SUTURE ENDOST 10MM (ENDOMECHANICALS) ×4 IMPLANT
DEVICE TI KNOT TK5 (MISCELLANEOUS) ×1
DEVICE TROCAR PUNCTURE CLOSURE (ENDOMECHANICALS) IMPLANT
DISSECTOR BLUNT TIP ENDO 5MM (MISCELLANEOUS) ×4 IMPLANT
DRAIN CHANNEL 19F RND (DRAIN) IMPLANT
DRAIN PENROSE 18X1/2 LTX STRL (DRAIN) ×4 IMPLANT
DRAPE CAMERA CLOSED 9X96 (DRAPES) ×4 IMPLANT
DRAPE LAPAROSCOPIC ABDOMINAL (DRAPES) ×4 IMPLANT
ELECT REM PT RETURN 9FT ADLT (ELECTROSURGICAL) ×4
ELECTRODE REM PT RTRN 9FT ADLT (ELECTROSURGICAL) ×2 IMPLANT
EVACUATOR SILICONE 100CC (DRAIN) IMPLANT
FELT TEFLON 4 X1 (Mesh General) ×4 IMPLANT
FILTER SMOKE EVAC LAPAROSHD (FILTER) IMPLANT
GLOVE BIOGEL M 8.0 STRL (GLOVE) ×4 IMPLANT
GLOVE BIOGEL PI IND STRL 7.0 (GLOVE) IMPLANT
GLOVE BIOGEL PI INDICATOR 7.0 (GLOVE)
GOWN STRL REUS W/TWL LRG LVL3 (GOWN DISPOSABLE) ×2 IMPLANT
GOWN STRL REUS W/TWL XL LVL3 (GOWN DISPOSABLE) ×24 IMPLANT
GRASPER ENDO BABCOCK 10 (MISCELLANEOUS) IMPLANT
GRASPER ENDO BABCOCK 10MM (MISCELLANEOUS)
HOVERMATT SINGLE USE (MISCELLANEOUS) ×4 IMPLANT
KIT BASIN OR (CUSTOM PROCEDURE TRAY) ×4 IMPLANT
MANIFOLD NEPTUNE II (INSTRUMENTS) ×2 IMPLANT
MARKER SKIN DUAL TIP RULER LAB (MISCELLANEOUS) ×3 IMPLANT
NDL SPNL 22GX3.5 QUINCKE BK (NEEDLE) ×2 IMPLANT
NEEDLE SPNL 22GX3.5 QUINCKE BK (NEEDLE) ×4 IMPLANT
NS IRRIG 1000ML POUR BTL (IV SOLUTION) ×4 IMPLANT
PACK UNIVERSAL I (CUSTOM PROCEDURE TRAY) ×4 IMPLANT
PENCIL BUTTON HOLSTER BLD 10FT (ELECTRODE) ×4 IMPLANT
QUICK LOAD TK 5 (STAPLE) ×1
RELOAD BLUE (STAPLE) ×8 IMPLANT
RELOAD EGIA 60 MED/THCK PURPLE (STAPLE) ×12 IMPLANT
RELOAD ENDO STITCH (ENDOMECHANICALS) IMPLANT
RELOAD GOLD (STAPLE) IMPLANT
RELOAD GREEN (STAPLE) ×4 IMPLANT
RELOAD STAPLE 60 MED/THCK ART (STAPLE) IMPLANT
RELOAD SUT TRIPLE-STITCH 2-0 (ENDOMECHANICALS) IMPLANT
SCALPEL HARMONIC ACE (MISCELLANEOUS) ×2 IMPLANT
SCISSORS LAP 5X35 DISP (ENDOMECHANICALS) ×4 IMPLANT
SCISSORS LAP 5X45 EPIX DISP (ENDOMECHANICALS) ×2 IMPLANT
SCRUB PCMX 4 OZ (MISCELLANEOUS) ×8 IMPLANT
SEALANT SURGICAL APPL DUAL CAN (MISCELLANEOUS) ×4 IMPLANT
SET IRRIG TUBING LAPAROSCOPIC (IRRIGATION / IRRIGATOR) ×4 IMPLANT
SHEARS CURVED HARMONIC AC 45CM (MISCELLANEOUS) ×4 IMPLANT
SLEEVE ADV FIXATION 12X100MM (TROCAR) IMPLANT
SLEEVE GASTRECTOMY 36FR VISIGI (MISCELLANEOUS) ×4 IMPLANT
SLEEVE Z-THREAD 5X100MM (TROCAR) IMPLANT
SOLUTION ANTI FOG 6CC (MISCELLANEOUS) ×4 IMPLANT
SPONGE GAUZE 4X4 12PLY (GAUZE/BANDAGES/DRESSINGS) IMPLANT
SPONGE LAP 18X18 X RAY DECT (DISPOSABLE) ×4 IMPLANT
STAPLE ECHEON FLEX 60 POW ENDO (STAPLE) ×4 IMPLANT
STAPLER VISISTAT 35W (STAPLE) ×4 IMPLANT
STRIP CLOSURE SKIN 1/2X4 (GAUZE/BANDAGES/DRESSINGS) IMPLANT
SUT ETHILON 2 0 PS N (SUTURE) IMPLANT
SUT SURGIDAC NAB ES-9 0 48 120 (SUTURE) ×16 IMPLANT
SUT VIC AB 4-0 SH 18 (SUTURE) ×4 IMPLANT
SYR 20CC LL (SYRINGE) ×4 IMPLANT
SYR 30ML LL (SYRINGE) ×4 IMPLANT
SYR 50ML LL SCALE MARK (SYRINGE) ×4 IMPLANT
TIP INNERVISION DETACH 40FR (MISCELLANEOUS) IMPLANT
TIP INNERVISION DETACH 50FR (MISCELLANEOUS) IMPLANT
TIP INNERVISION DETACH 56FR (MISCELLANEOUS) IMPLANT
TIPS INNERVISION DETACH 40FR (MISCELLANEOUS)
TOWEL OR 17X26 10 PK STRL BLUE (TOWEL DISPOSABLE) ×8 IMPLANT
TOWEL OR NON WOVEN STRL DISP B (DISPOSABLE) ×4 IMPLANT
TRAY FOLEY CATH 14FRSI W/METER (CATHETERS) ×4 IMPLANT
TRAY LAP CHOLE (CUSTOM PROCEDURE TRAY) ×4 IMPLANT
TROCAR ADV FIXATION 11X100MM (TROCAR) IMPLANT
TROCAR ADV FIXATION 12X100MM (TROCAR) ×4 IMPLANT
TROCAR ADV FIXATION 5X100MM (TROCAR) ×4 IMPLANT
TROCAR BLADELESS 15MM (ENDOMECHANICALS) ×4 IMPLANT
TROCAR BLADELESS OPT 5 100 (ENDOMECHANICALS) ×4 IMPLANT
TROCAR XCEL BLUNT TIP 100MML (ENDOMECHANICALS) IMPLANT
TROCAR XCEL NON-BLD 11X100MML (ENDOMECHANICALS) ×4 IMPLANT
TROCAR XCEL UNIV SLVE 11M 100M (ENDOMECHANICALS) IMPLANT
TUBING CONNECTING 10 (TUBING) ×3 IMPLANT
TUBING CONNECTING 10' (TUBING) ×1
TUBING ENDO SMARTCAP (MISCELLANEOUS) ×4 IMPLANT
TUBING FILTER THERMOFLATOR (ELECTROSURGICAL) ×4 IMPLANT

## 2014-02-11 NOTE — Op Note (Signed)
Surgeon: Kaylyn Lim, MD, FACS  Asst:  Adonis Housekeeper, MD,FACS  Anes:  General endotracheal  Procedure: Laparoscopic sleeve gastrectomy, repair of hiatus hernia with 1 posterior suture; endoscopy per Dr. Excell Seltzer  Diagnosis: Morbid obesity  Complications: none  EBL:   minimal cc  Description of Procedure:  The patient was take to OR 1 and given general anesthesia.  The abdomen was prepped with PCMX and draped sterilely.  A timeout was performed.  Access to the abdomen was achieved with 5 mm Optiview without difficulty.  Following insufflation, the state of the abdomen was found to be without upper abdominal adhesions.  The ViSiGi 36Fr tube was inserted to deflate the stomach and was pulled back into the esophagus.  A posterior dissection of the hiatus revealed a patuous hiatus and this was repaired with a single suture Endo stitch with 0 surgidek.    The pylorus was identified and we measured 5 cm back and marked the antrum.  At that point we began dissection to take down the greater curvature of the stomach using the Harmonic scalpel.  This dissection was taken all the way up to the left crus.  Posterior attachments of the stomach were also taken down.    The ViSiGi tube was then passed into the antrum and suction applied so that it was snug along the lessor curvature.  The "crow's foot" or incisura was identified.  The sleeve gastrectomy was begun using the Covidien stapler beginning with a black load and followed by several purple 6 cm loads.  When the sleeve was complete the tube was taken off suction and insufflated briefly.  The tube was withdrawn.  Upper endoscopy was then performed by Dr. Excell Seltzer showed no bleeding or leaks.    Tisseal was applied along the staple line.  The specimen was extracted through the 15 trocar site.  Wounds were infiltrated with Exparel and closed with 4-0 vicryl and Dermabond.    Matt B. Hassell Done, [place], [facility], [place]

## 2014-02-11 NOTE — Interval H&P Note (Signed)
History and Physical Interval Note:  02/11/2014 10:00 AM  Haley Wyatt  has presented today for surgery, with the diagnosis of morbid obesity  hiatal hernia   The various methods of treatment have been discussed with the patient and family. After consideration of risks, benefits and other options for treatment, the patient has consented to  Procedure(s): LAPAROSCOPIC GASTRIC SLEEVE RESECTION (N/A) LAPAROSCOPIC REPAIR OF HIATAL HERNIA (N/A) as a surgical intervention .  The patient's history has been reviewed, patient examined, no change in status, stable for surgery.  I have reviewed the patient's chart and labs.  Questions were answered to the patient's satisfaction.  Plan to look for hiatus hernia and repair and perform sleeve gastrectomy.     Pedro Earls

## 2014-02-11 NOTE — Anesthesia Postprocedure Evaluation (Signed)
  Anesthesia Post-op Note  Patient: Haley Wyatt  Procedure(s) Performed: Procedure(s) (LRB): LAPAROSCOPIC GASTRIC SLEEVE RESECTION (N/A) LAPAROSCOPIC REPAIR OF HIATAL HERNIA (N/A) UPPER GI ENDOSCOPY  Patient Location: PACU  Anesthesia Type: General  Level of Consciousness: awake and alert   Airway and Oxygen Therapy: Patient Spontanous Breathing  Post-op Pain: mild  Post-op Assessment: Post-op Vital signs reviewed, Patient's Cardiovascular Status Stable, Respiratory Function Stable, Patent Airway and No signs of Nausea or vomiting  Last Vitals:  Filed Vitals:   02/11/14 1345  BP: 155/92  Pulse: 99  Temp:   Resp: 15    Post-op Vital Signs: stable   Complications: No apparent anesthesia complications

## 2014-02-11 NOTE — H&P (View-Only) (Signed)
Chief Complaint:  Morbid obesity previously worked up and scheduled for sleeve gastrectomy  History of Present Illness:  Haley Wyatt is an 38 y.o. female who I saw on workup for a laparoscopic sleeve gastrectomy. In the meantime she is found to have a fibroid tumor. GYN would like for her to have her bariatric surgery prior to tackling the fibroid. She's completed her preoperative workup and is ready to schedule for laparoscopic sleeve gastrectomy.her upper GI did show some evidence of gastroesophageal reflux. Will look for attendance hiatal hernia and repair at the time of sleeve gastrectomy.  Past Medical History  Diagnosis Date  . Hypertension   . Allergy   . Fibroid tumor     inside uterus    Past Surgical History  Procedure Laterality Date  . Shoulder surgery      left shoulder loose body removal  . Carpal tunnel release    . Tubal ligation      Current Outpatient Prescriptions  Medication Sig Dispense Refill  . lisinopril-hydrochlorothiazide (PRINZIDE,ZESTORETIC) 20-12.5 MG per tablet Take 1 tablet by mouth daily.  90 tablet  1  . meloxicam (MOBIC) 15 MG tablet Take 1 tablet (15 mg total) by mouth daily.  30 tablet  0  . Multiple Vitamin (MULTIVITAMIN WITH MINERALS) TABS tablet Take 1 tablet by mouth daily.       No current facility-administered medications for this visit.   Review of patient's allergies indicates no known allergies. Family History  Problem Relation Age of Onset  . Hyperlipidemia Father   . Hypertension Father   . Diabetes Father   . Sudden death Neg Hx   . Heart attack Neg Hx   . Cancer Mother    Social History:   reports that she has quit smoking. Her smoking use included Cigarettes. She smoked 0.00 packs per day. She does not have any smokeless tobacco history on file. She reports that she does not drink alcohol or use illicit drugs.   REVIEW OF SYSTEMS - PERTINENT POSITIVES ONLY: No DVT  Physical Exam:   Blood pressure 124/78, pulse 78,  temperature 98.7 F (37.1 C), temperature source Temporal, resp. rate 16, height 5\' 9"  (1.753 m), weight 367 lb (166.47 kg), last menstrual period 01/19/2014. Body mass index is 54.17 kg/(m^2).  Gen:  WDWN African American female NAD  Neurological: Alert and oriented to person, place, and time. Motor and sensory function is grossly intact  Head: Normocephalic and atraumatic.  Eyes: Conjunctivae are normal. Pupils are equal, round, and reactive to light. No scleral icterus.  Neck: Normal range of motion. Neck supple. No tracheal deviation or thyromegaly present.  Cardiovascular:  SR without murmurs or gallops.  No carotid bruits Respiratory: Effort normal.  No respiratory distress. No chest wall tenderness. Breath sounds normal.  No wheezes, rales or rhonchi.  Abdomen:  Obese nontender GU: Musculoskeletal: Normal range of motion. Extremities are nontender. No cyanosis, edema or clubbing noted Lymphadenopathy: No cervical, preauricular, postauricular or axillary adenopathy is present Skin: Skin is warm and dry. No rash noted. No diaphoresis. No erythema. No pallor. Pscyh: Normal mood and affect. Behavior is normal. Judgment and thought content normal.   LABORATORY RESULTS: No results found for this or any previous visit (from the past 48 hour(s)).  RADIOLOGY RESULTS: No results found.  Problem List: Patient Active Problem List   Diagnosis Date Noted  . Eustachian tube dysfunction 01/01/2014  . Lower back pain 11/08/2013  . Vaginal discharge 07/16/2013  . Headache 07/16/2013  . Serous  otitis media 07/16/2013  . Fatigue 07/16/2013  . Encounter for routine gynecological examination 07/16/2013  . Morbid obesity 07/16/2013  . Pre-diabetes 01/31/2013  . Menorrhagia 05/23/2012  . Bacterial vaginosis 05/23/2012  . 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 Left shoulder pain 10/28/2011  . 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 HYPERTENSION 08/16/2010  . 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 LEG EDEMA, 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  . GERD 06/11/2010  . HYPOTHYROIDISM 04/16/2009  . OBESITY 04/16/2009  . DEPRESSION 03/30/2009    Assessment & Plan: Morbid obesity for sleeve gastrectomy. Patient has reflux will look at repairing hiatal hernia at the same time.  Questions answered.  Ready for sleeve gastrectomy on Tuesday, April 21.     Matt B. Hassell Done, MD, [facility] [phone] beeper [phone]  01/23/2014 11:10 AM

## 2014-02-11 NOTE — Anesthesia Preprocedure Evaluation (Signed)
Anesthesia Evaluation  Patient identified by MRN, date of birth, ID band Patient awake    Reviewed: Allergy & Precautions, H&P , NPO status , Patient's Chart, lab work & pertinent test results  Airway Mallampati: III TM Distance: <3 FB Neck ROM: Full    Dental no notable dental hx.    Pulmonary neg pulmonary ROS, former smoker,  breath sounds clear to auscultation  + decreased breath sounds      Cardiovascular hypertension, Pt. on medications negative cardio ROS  Rhythm:Regular Rate:Normal     Neuro/Psych negative neurological ROS  negative psych ROS   GI/Hepatic Neg liver ROS, hiatal hernia,   Endo/Other  Hypothyroidism Morbid obesity  Renal/GU negative Renal ROS  negative genitourinary   Musculoskeletal negative musculoskeletal ROS (+)   Abdominal   Peds negative pediatric ROS (+)  Hematology negative hematology ROS (+)   Anesthesia Other Findings   Reproductive/Obstetrics negative OB ROS                           Anesthesia Physical Anesthesia Plan  ASA: III  Anesthesia Plan: General   Post-op Pain Management:    Induction: Intravenous  Airway Management Planned: Oral ETT  Additional Equipment:   Intra-op Plan:   Post-operative Plan: Extubation in OR  Informed Consent: I have reviewed the patients History and Physical, chart, labs and discussed the procedure including the risks, benefits and alternatives for the proposed anesthesia with the patient or authorized representative who has indicated his/her understanding and acceptance.   Dental advisory given  Plan Discussed with: CRNA and Surgeon  Anesthesia Plan Comments:         Anesthesia Quick Evaluation

## 2014-02-11 NOTE — Progress Notes (Addendum)
Patient just decided she wants to be a full HIPPA. She does not want to be put in directory at all. Admitting notified. Process began

## 2014-02-11 NOTE — Transfer of Care (Signed)
Immediate Anesthesia Transfer of Care Note  Patient: Haley Wyatt  Procedure(s) Performed: Procedure(s): LAPAROSCOPIC GASTRIC SLEEVE RESECTION (N/A) LAPAROSCOPIC REPAIR OF HIATAL HERNIA (N/A) UPPER GI ENDOSCOPY  Patient Location: PACU  Anesthesia Type:General  Level of Consciousness: awake, sedated and patient cooperative  Airway & Oxygen Therapy: Patient Spontanous Breathing and Patient connected to face mask oxygen  Post-op Assessment: Report given to PACU RN and Post -op Vital signs reviewed and stable  Post vital signs: Reviewed and stable  Complications: No apparent anesthesia complications

## 2014-02-12 ENCOUNTER — Inpatient Hospital Stay (HOSPITAL_COMMUNITY): Payer: BC Managed Care – PPO

## 2014-02-12 ENCOUNTER — Encounter (HOSPITAL_COMMUNITY): Payer: Self-pay | Admitting: Surgery

## 2014-02-12 DIAGNOSIS — Z9889 Other specified postprocedural states: Secondary | ICD-10-CM

## 2014-02-12 LAB — CBC WITH DIFFERENTIAL/PLATELET
BASOS ABS: 0 10*3/uL (ref 0.0–0.1)
BASOS PCT: 0 % (ref 0–1)
EOS PCT: 0 % (ref 0–5)
Eosinophils Absolute: 0 10*3/uL (ref 0.0–0.7)
HCT: 37.3 % (ref 36.0–46.0)
Hemoglobin: 12.5 g/dL (ref 12.0–15.0)
Lymphocytes Relative: 16 % (ref 12–46)
Lymphs Abs: 1.9 10*3/uL (ref 0.7–4.0)
MCH: 27.4 pg (ref 26.0–34.0)
MCHC: 33.5 g/dL (ref 30.0–36.0)
MCV: 81.6 fL (ref 78.0–100.0)
MONO ABS: 0.9 10*3/uL (ref 0.1–1.0)
Monocytes Relative: 7 % (ref 3–12)
NEUTROS ABS: 9.3 10*3/uL — AB (ref 1.7–7.7)
Neutrophils Relative %: 77 % (ref 43–77)
Platelets: 242 10*3/uL (ref 150–400)
RBC: 4.57 MIL/uL (ref 3.87–5.11)
RDW: 14.7 % (ref 11.5–15.5)
WBC: 12.1 10*3/uL — AB (ref 4.0–10.5)

## 2014-02-12 LAB — URINE CULTURE
COLONY COUNT: NO GROWTH
Culture: NO GROWTH

## 2014-02-12 LAB — HEMOGLOBIN AND HEMATOCRIT, BLOOD
HCT: 37.6 % (ref 36.0–46.0)
HEMOGLOBIN: 12.3 g/dL (ref 12.0–15.0)

## 2014-02-12 MED ORDER — IOHEXOL 300 MG/ML  SOLN
50.0000 mL | Freq: Once | INTRAMUSCULAR | Status: AC | PRN
  Administered 2014-02-12: 50 mL via ORAL

## 2014-02-12 NOTE — Care Management Note (Signed)
    Page 1 of 1   02/12/2014     11:43:39 AM CARE MANAGEMENT NOTE 02/12/2014  Patient:  Haley Wyatt, Haley Wyatt   Account Number:  [id number]  Date Initiated:  02/12/2014  Documentation initiated by:  Sunday Spillers  Subjective/Objective Assessment:   38 yo female admitted s/p sleeve gastrectomy. PTA lived at home.     Action/Plan:   Home when stable   Anticipated DC Date:  02/14/2014   Anticipated DC Plan:  [place]  CM consult      Choice offered to / List presented to:             Status of service:  Completed, signed off Medicare Important Message given?  NA - LOS <3 / Initial given by admissions (If response is "NO", the following Medicare IM given date fields will be blank) Date Medicare IM given:   Date Additional Medicare IM given:    Discharge Disposition:  HOME/SELF CARE  Per UR Regulation:  Reviewed for med. necessity/level of care/duration of stay  If discussed at [place] of Stay Meetings, dates discussed:    Comments:

## 2014-02-12 NOTE — Progress Notes (Signed)
Nutrition Education Note  Patient identified via consult for DROP protocol.   Wt Readings from Last 5 Encounters:  02/11/14 358 lb 2 oz (162.444 kg)  02/11/14 358 lb 2 oz (162.444 kg)  02/05/14 366 lb (166.017 kg)  02/05/14 370 lb (167.831 kg)  01/23/14 367 lb (166.47 kg)    Body mass index is 52.86 kg/(m^2). Patient meets criteria for class III extreme obesity based on current BMI.   Discussed 2 week post op diet with pt. Emphasized that liquids consumed must be non-carbonated, non-caffeinated, and sugar free. Importance of adequate hydration discussed. Diet questions answered.   Diet: First 2 Weeks  You will see the nutritionist about two (2) weeks after your surgery. The nutritionist will increase the types of foods you can eat if you are handling liquids well:  If you have severe vomiting or nausea and cannot handle clear liquids lasting longer than 1 day, call your surgeon  Protein Shake  Drink at least 2 ounces of shake 5-6 times per day  Each serving of protein shakes (usually 8 - 12 ounces) should have a minimum of:  15 grams of protein  And no more than 5 grams of carbohydrate  Goal for protein each day:  Men = 80 grams per day  Women = 60 grams per day  Protein powder may be added to fluids such as non-fat milk or Lactaid milk or Soy milk (limit to 35 grams added protein powder per serving)   Hydration  Slowly increase the amount of water and other clear liquids as tolerated (See Acceptable Fluids)  Slowly increase the amount of protein shake as tolerated  Sip fluids slowly and throughout the day  May use sugar substitutes in small amounts (no more than 6 - 8 packets per day; i.e. Splenda)   Fluid Goal  The first goal is to drink at least 8 ounces of protein shake/drink per day (or as directed by the nutritionist); some examples of protein shakes are [employer], [employer], EAS Edge HP, and [place]jury. See handout from pre-op Bariatric Education Class:  Slowly  increase the amount of protein shake you drink as tolerated  You may find it easier to slowly sip shakes throughout the day  It is important to get your proteins in first  Your fluid goal is to drink 64 - 100 ounces of fluid daily  It may take a few weeks to build up to this  32 oz (or more) should be clear liquids  And  32 oz (or more) should be full liquids (see below for examples)  Liquids should not contain sugar, caffeine, or carbonation   Clear Liquids:  Water or Sugar-free flavored water (i.e. Fruit H2O, Propel)  Decaffeinated coffee or tea (sugar-free)  Crystal Lite, Wyler's Lite, Minute Maid Lite  Sugar-free Jell-O  Bouillon or broth  Sugar-free Popsicle: *Less than 20 calories each; Limit 1 per day   Full Liquids:  Protein Shakes/Drinks + 2 choices per day of other full liquids  Full liquids must be:  No More Than 12 grams of Carbs per serving  No More Than 3 grams of Fat per serving  Strained low-fat cream soup  Non-Fat milk  Fat-free Lactaid Milk  Sugar-free yogurt ([place] Lite & Fit, [place] yogurt)   [place] MS, [place], [place] Pager  [phone] After Hours Pager

## 2014-02-12 NOTE — Progress Notes (Signed)
VASCULAR LAB PRELIMINARY  PRELIMINARY  PRELIMINARY  PRELIMINARY  Bilateral lower extremity venous duplex completed.    Preliminary report:  Bilateral:  No evidence of DVT, superficial thrombosis, or Baker's Cyst.   Vilma Prader Geonna Lockyer, RVS 02/12/2014, 9:18 AM

## 2014-02-12 NOTE — Progress Notes (Signed)
Patient ID: Haley Wyatt, female   DOB: [birth year], 38 y.o.   MRN: [id number] [facility] Progress Note:   1 Day Post-Op  Subjective: Mental status is clear.  No complaints this morning Objective: Vital signs in last 24 hours: Temp:  [97.9 F (36.6 C)-99.4 F (37.4 C)] 99.3 F (37.4 C) (04/22 0800) Pulse Rate:  [80-118] 80 (04/22 0800) Resp:  [11-24] 20 (04/22 0800) BP: (130-170)/(63-102) 163/92 mmHg (04/22 0800) SpO2:  [95 %-100 %] 95 % (04/22 0800)  Intake/Output from previous day: 04/21 0701 - 04/22 0700 In: 3536.7 [I.V.:3536.7] Out: 2750 [Urine:2750] Intake/Output this shift: Total I/O In: -  Out: 700 [Urine:700]  Physical Exam: Work of breathing is not labored.  DVT scan negative.  UGI ok.    Lab Results:  Results for orders placed during the hospital encounter of 02/11/14 (from the past 48 hour(s))  PREGNANCY, URINE     Status: None   Collection Time    02/11/14  9:21 AM      Result Value Ref Range   Preg Test, Ur NEGATIVE  NEGATIVE   Comment:            THE SENSITIVITY OF THIS     METHODOLOGY IS >20 mIU/mL.  HEMOGLOBIN AND HEMATOCRIT, BLOOD     Status: None   Collection Time    02/11/14  1:19 PM      Result Value Ref Range   Hemoglobin 12.2  12.0 - 15.0 g/dL   HCT 37.4  36.0 - 46.0 %  CBC     Status: Abnormal   Collection Time    02/11/14  2:45 PM      Result Value Ref Range   WBC 17.1 (*) 4.0 - 10.5 K/uL   Comment: WHITE COUNT CONFIRMED ON SMEAR   RBC 4.59  3.87 - 5.11 MIL/uL   Hemoglobin 12.3  12.0 - 15.0 g/dL   HCT 37.2  36.0 - 46.0 %   MCV 81.0  78.0 - 100.0 fL   MCH 26.8  26.0 - 34.0 pg   MCHC 33.1  30.0 - 36.0 g/dL   RDW 14.8  11.5 - 15.5 %   Platelets 277  150 - 400 K/uL  CREATININE, SERUM     Status: Abnormal   Collection Time    02/11/14  2:45 PM      Result Value Ref Range   Creatinine, Ser 0.89  0.50 - 1.10 mg/dL   GFR calc non Af Amer 81 (*) >90 mL/min   GFR calc Af Amer >90  >90 mL/min   Comment: (NOTE)     The eGFR  has been calculated using the CKD EPI equation.     This calculation has not been validated in all clinical situations.     eGFR's persistently <90 mL/min signify possible Chronic Kidney     Disease.  CBC WITH DIFFERENTIAL     Status: Abnormal   Collection Time    02/12/14  4:10 AM      Result Value Ref Range   WBC 12.1 (*) 4.0 - 10.5 K/uL   RBC 4.57  3.87 - 5.11 MIL/uL   Hemoglobin 12.5  12.0 - 15.0 g/dL   HCT 37.3  36.0 - 46.0 %   MCV 81.6  78.0 - 100.0 fL   MCH 27.4  26.0 - 34.0 pg   MCHC 33.5  30.0 - 36.0 g/dL   RDW 14.7  11.5 - 15.5 %   Platelets 242  150 - 400 K/uL  Neutrophils Relative % 77  43 - 77 %   Neutro Abs 9.3 (*) 1.7 - 7.7 K/uL   Lymphocytes Relative 16  12 - 46 %   Lymphs Abs 1.9  0.7 - 4.0 K/uL   Monocytes Relative 7  3 - 12 %   Monocytes Absolute 0.9  0.1 - 1.0 K/uL   Eosinophils Relative 0  0 - 5 %   Eosinophils Absolute 0.0  0.0 - 0.7 K/uL   Basophils Relative 0  0 - 1 %   Basophils Absolute 0.0  0.0 - 0.1 K/uL    Radiology/Results: Dg Ugi W/water Sol Cm  02/12/2014   CLINICAL DATA:  Status post gastric sleeve resection for weight loss  EXAM: WATER SOLUBLE UPPER GI SERIES WITH KUB  TECHNIQUE: Single-column upper GI series was performed using water soluble contrast.  CONTRAST:  31m OMNIPAQUE IOHEXOL 300 MG/ML  SOLN  COMPARISON:  None.  FLUOROSCOPY TIME:  1 min 18 second  FINDINGS: The preliminary abdomen image shows a normal gas pattern.  The esophagus, stomach, and duodenum rib visualized. The patient has some diminished primary peristalsis. There is a small hiatal hernia without appreciable reflux. There is no esophageal stricture mass, or ulceration.  The patient has undergone gastric sleeve wrist section. The gastric sleeve is widely patent with free flow of contrast into the distal most aspect of the stomach and duodenum. The isolated segment of stomach shows no contrast. There is no stricture, mass, or ulceration in the sleeve portion. Duodenum appears  normal without mass or ulceration.  IMPRESSION: Status post sleeve resection without appreciable contrast reaching the isolated portion of the stomach. No mass or ulceration seen. No stricture. There is a small hiatal hernia with diminished primary peristalsis in the esophagus.   Electronically Signed   By: WLowella GripM.D.   On: 02/12/2014 10:39    Anti-infectives: Anti-infectives   Start     Dose/Rate Route Frequency Ordered Stop   02/11/14 0836  cefOXitin (MEFOXIN) 2 g in dextrose 5 % 50 mL IVPB     2 g 100 mL/hr over 30 Minutes Intravenous On call to O.R. 02/11/14 0836 02/11/14 1016      Assessment/Plan: Problem List: Patient Active Problem List   Diagnosis Date Noted  . 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 Eustachian tube dysfunction 01/01/2014  . Lower back pain 11/08/2013  . Vaginal discharge 07/16/2013  . Headache 07/16/2013  . Serous otitis media 07/16/2013  . Fatigue 07/16/2013  . Encounter for routine gynecological examination 07/16/2013  . Morbid obesity 07/16/2013  . Pre-diabetes 01/31/2013  . Menorrhagia 05/23/2012  . Bacterial vaginosis 05/23/2012  . 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 Left shoulder pain 10/28/2011  . 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 HYPERTENSION 08/16/2010  . 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 LEG EDEMA, 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  . GERD 06/11/2010  . HYPOTHYROIDISM 04/16/2009  . OBESITY 04/16/2009  . DEPRESSION 03/30/2009    Begin PD 1 diet. 1 Day Post-Op    LOS: 1 day   Matt B. MHassell Done MD, FChi [facility] [phone] [phone] 02/12/2014 11:48 AM

## 2014-02-13 LAB — CBC WITH DIFFERENTIAL/PLATELET
BASOS ABS: 0 10*3/uL (ref 0.0–0.1)
BASOS PCT: 0 % (ref 0–1)
EOS ABS: 0 10*3/uL (ref 0.0–0.7)
EOS PCT: 0 % (ref 0–5)
HCT: 37.6 % (ref 36.0–46.0)
Hemoglobin: 12.2 g/dL (ref 12.0–15.0)
Lymphocytes Relative: 38 % (ref 12–46)
Lymphs Abs: 3.7 10*3/uL (ref 0.7–4.0)
MCH: 26.5 pg (ref 26.0–34.0)
MCHC: 32.4 g/dL (ref 30.0–36.0)
MCV: 81.6 fL (ref 78.0–100.0)
Monocytes Absolute: 0.6 10*3/uL (ref 0.1–1.0)
Monocytes Relative: 7 % (ref 3–12)
Neutro Abs: 5.3 10*3/uL (ref 1.7–7.7)
Neutrophils Relative %: 55 % (ref 43–77)
Platelets: 250 10*3/uL (ref 150–400)
RBC: 4.61 MIL/uL (ref 3.87–5.11)
RDW: 14.9 % (ref 11.5–15.5)
WBC: 9.7 10*3/uL (ref 4.0–10.5)

## 2014-02-13 NOTE — Discharge Summary (Signed)
Physician Discharge Summary  Patient ID: Haley Wyatt MRN: [id number] DOB/AGE: 40-Oct-1977 40 y.o.  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  UGI on PD 1 looked good.  DVT study negative.  Diet advanced and ready for discharge on PD 2  Consults: none  Significant Diagnostic Studies: UGI    Discharge Exam: Blood pressure 166/106, pulse 100, temperature 99.3 F (37.4 C), temperature source Oral, resp. rate 18, height 5\' 9"  (1.753 m), weight 358 lb 2 oz (162.444 kg), last menstrual period 01/15/2014, SpO2 93.00%. Minimal abdominal pain  Disposition: 01-Home or Self Care  Discharge Orders   Future Appointments Provider Department Dept Phone   02/25/2014 3:30 PM [facility] [phone]   03/05/2014 11:40 AM Pedro Earls, MD [facility] [place] [phone]   Future Orders Complete By Expires   Call MD for:  persistant nausea and vomiting  As directed    Call MD for:  severe uncontrolled pain  As directed    Discharge instructions  As directed    Discharge wound care:  As directed    Increase activity slowly  As directed        Medication List    STOP taking these medications       meloxicam 15 MG tablet  Commonly known as:  MOBIC      TAKE these medications       acetaminophen 500 MG tablet  Commonly known as:  TYLENOL  Take 1,000 mg by mouth 3 (three) times daily as needed for moderate pain.     HYDROcodone-acetaminophen 7.5-325 mg/15 ml solution  Commonly known as:  HYCET  Take 10 mLs by mouth 4 (four) times daily as needed for moderate pain.     HYDROcodone-acetaminophen 5-325 MG per tablet   Commonly known as:  NORCO/VICODIN  Take 2 tablets by mouth every 4 (four) hours as needed.     lisinopril-hydrochlorothiazide 20-12.5 MG per tablet  Commonly known as:  PRINZIDE,ZESTORETIC  Take 1 tablet by mouth every morning.           Follow-up Information   Follow up with Pedro Earls, MD.   Specialty:  General Surgery   Contact information:   [address] [place] [address] [phone]       Signed: Pedro Earls 02/13/2014, 8:16 AM

## 2014-02-13 NOTE — Progress Notes (Signed)
Pt c/o abd pain after drinking a few sips of crystal light and taking a few bites of jello. Had to administer morphine. Notified MD, told to advise pt to sip her liquids slowly, and refer her to bariatric nurse for education.

## 2014-02-13 NOTE — Discharge Instructions (Signed)

## 2014-02-13 NOTE — Progress Notes (Signed)
Patient alert and oriented, pain is controlled. Patient is tolerating fluids, plan to advance to protein shake today.  Reviewed Gastric sleeve discharge instructions with patient and patient is able to articulate understanding.  Provided information on BELT program, Support Group and [facility] outpatient pharmacy. All questions answered, will continue to monitor.  

## 2014-02-25 ENCOUNTER — Encounter: Payer: BC Managed Care – PPO | Attending: Surgery

## 2014-02-25 DIAGNOSIS — Z713 Dietary counseling and surveillance: Secondary | ICD-10-CM | POA: Insufficient documentation

## 2014-02-25 DIAGNOSIS — Z6841 Body Mass Index (BMI) 40.0 and over, adult: Secondary | ICD-10-CM | POA: Insufficient documentation

## 2014-02-25 DIAGNOSIS — Z01818 Encounter for other preprocedural examination: Secondary | ICD-10-CM | POA: Insufficient documentation

## 2014-02-25 NOTE — Patient Instructions (Signed)
Patient to follow Phase 3A-Soft, High Protein Diet and follow-up at [facility] in 6 weeks for 2 months post-op nutrition visit for diet advancement. 

## 2014-02-25 NOTE — Progress Notes (Signed)
Bariatric Class:  Appt start time: 1530 end time:  1630.  2 Week Post-Operative Nutrition Class  Patient was seen on 02/25/2014 for Post-Operative Nutrition education at [facility].   Surgery date: 02/11/2014 Surgery type: Gastric Sleeve [facility] weight at [facility]: 380 on 07/17/13 Weight today: 342.5 lbs Weight change: 27.5 lbs  TANITA  BODY COMP RESULTS  02/03/14 02/25/14   BMI (kg/m^2) 54.6 50.6   Fat Mass (lbs) 194.5 186.5   Fat Free Mass (lbs) 175.5 156   Total Body Water (lbs) 128.5 114   The following the learning objectives were met by the patient during this course:  Identifies Phase 3A (Soft, High Proteins) Dietary Goals and will begin from 2 weeks post-operatively to 2 months post-operatively  Identifies appropriate sources of fluids and proteins   States protein recommendations and appropriate sources post-operatively  Identifies the need for appropriate texture modifications, mastication, and bite sizes when consuming solids  Identifies appropriate multivitamin and calcium sources post-operatively  Describes the need for physical activity post-operatively and will follow MD recommendations  States when to call healthcare provider regarding medication questions or post-operative complications  Handouts given during class include:  Phase 3A: Soft, High Protein Diet Handout  Follow-Up Plan: Patient will follow-up at [facility] in 6 weeks for 2 month post-op nutrition visit for diet advancement per MD.

## 2014-03-03 ENCOUNTER — Telehealth (HOSPITAL_COMMUNITY): Payer: Self-pay

## 2014-03-03 NOTE — Telephone Encounter (Signed)
Made discharge phone call to patient per DROP protocol. Asking the following questions.    1. Do you have someone to care for you now that you are home?  yes 2. Are you having pain now that is not relieved by your pain medication?  no 3. Are you able to drink the recommended daily amount of fluids (48 ounces minimum/day) and protein (60-80 grams/day) as prescribed by the dietitian or nutritional counselor?  yes 4. Are you taking the vitamins and minerals as prescribed?  yes 5. Do you have the "on call" number to contact your surgeon if you have a problem or question?  yes 6. Are your incisions free of redness, swelling or drainage? (If steri strips, address that these can fall off, shower as tolerated) yes 7. Have your bowels moved since your surgery?  If not, are you passing gas?  No, yes 8. Are you up and walking 3-4 times per day?  yes 9. Do you have an appointment to see a dietitian or nutritional counselor in the next month?  yes  The following questions can be added at the center's discharge phone call script at the center's discretion.  The questions are also captured within [employer] project custom fields. 1. Do you have an appointment made to see your surgeon in the next month?  yes 2. Were you provided your discharge medications before your surgery or before you were discharged from the hospital and are you taking them without problem?  yes 3. Were you provided phone numbers to the clinic/surgeon's office?  yes 4. Did you watch the patient education video module in the (clinic, surgeon's office, etc.) before your surgery? yes 5. Do you have a discharge checklist that was provided to you in the hospital to reference with instructions on how to take care of yourself after surgery?  yes 6. Did you see a dietitian or nutritional counselor while you were in the hospital?  yes   

## 2014-03-05 ENCOUNTER — Encounter (INDEPENDENT_AMBULATORY_CARE_PROVIDER_SITE_OTHER): Payer: Self-pay | Admitting: Surgery

## 2014-03-05 ENCOUNTER — Ambulatory Visit (INDEPENDENT_AMBULATORY_CARE_PROVIDER_SITE_OTHER): Payer: BC Managed Care – PPO | Admitting: Surgery

## 2014-03-05 VITALS — BP 130/82 | HR 80 | Temp 97.6°F | Ht 69.0 in | Wt 344.6 lb

## 2014-03-05 DIAGNOSIS — Z9884 Bariatric surgery status: Secondary | ICD-10-CM

## 2014-03-05 NOTE — Progress Notes (Signed)
Haley Wyatt 38 y.o.  Body mass index is 50.87 kg/(m^2).  Patient Active Problem List   Diagnosis Date Noted  . 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 Eustachian tube dysfunction 01/01/2014  . Lower back pain 11/08/2013  . Vaginal discharge 07/16/2013  . Headache 07/16/2013  . Serous otitis media 07/16/2013  . Fatigue 07/16/2013  . Encounter for routine gynecological examination 07/16/2013  . Morbid obesity 07/16/2013  . Pre-diabetes 01/31/2013  . Menorrhagia 05/23/2012  . Bacterial vaginosis 05/23/2012  . 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 Left shoulder pain 10/28/2011  . 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 HYPERTENSION 08/16/2010  . 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 LEG EDEMA, 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  . GERD 06/11/2010  . HYPOTHYROIDISM 04/16/2009  . OBESITY 04/16/2009  . DEPRESSION 03/30/2009    No Known Allergies  Past Surgical History  Procedure Laterality Date  . Shoulder surgery      left shoulder loose body removal  . Carpal tunnel release    . Tubal ligation    . Laparoscopic gastric sleeve resection N/A 02/11/2014    Procedure: LAPAROSCOPIC GASTRIC SLEEVE RESECTION;  Surgeon: Pedro Earls, MD;  Location: [facility];  Service: General;  Laterality: N/A;  . Hiatal hernia repair N/A 02/11/2014    Procedure: LAPAROSCOPIC REPAIR OF HIATAL HERNIA;  Surgeon: Pedro Earls, MD;  Location: [facility];  Service: General;  Laterality: N/A;  . Upper gi endoscopy  02/11/2014    Procedure: UPPER GI ENDOSCOPY;  Surgeon: Pedro Earls, MD;  Location: [facility];  Service: General;;   [name], MD 1. Status post laparoscopic sleeve gastrectomy     Incisions look good.  Staying on diet.  Good support from fiancee.  Return 2 months Matt B. Hassell Done, MD, [facility] [phone] beeper [phone]  03/05/2014 12:34 PM

## 2014-03-05 NOTE — Patient Instructions (Signed)
May use Miralax to avoid constipation May have a small black coffee in the morning

## 2014-03-31 ENCOUNTER — Other Ambulatory Visit: Payer: Self-pay | Admitting: Obstetrics

## 2014-04-08 ENCOUNTER — Encounter: Payer: BC Managed Care – PPO | Attending: Surgery | Admitting: Dietician

## 2014-04-08 DIAGNOSIS — Z01818 Encounter for other preprocedural examination: Secondary | ICD-10-CM | POA: Insufficient documentation

## 2014-04-08 DIAGNOSIS — Z713 Dietary counseling and surveillance: Secondary | ICD-10-CM | POA: Insufficient documentation

## 2014-04-08 DIAGNOSIS — Z6841 Body Mass Index (BMI) 40.0 and over, adult: Secondary | ICD-10-CM | POA: Insufficient documentation

## 2014-04-08 NOTE — Patient Instructions (Addendum)
-  Find a "sublingual" B12 -Try Celebrate brand Calcium chews -Try Protein Chips -Try having a protein snacks in between meals -Work on getting fluids in  -Avoid using straws -Try Isopure protein drink   Good protein meals and snacks: -Wendy's chili -Low fat cream of chicken soup -String cheese -Dannon Light and Fit [place] with protein powder -Salmon and shrimp -Deli meat -PB2 (add to shake)  -Add protein powder to anything you can!   TANITA  BODY COMP RESULTS  02/03/14 02/25/14 04/08/14   BMI (kg/m^2) 54.6 50.6 47.3   Fat Mass (lbs) 194.5 186.5 167   Fat Free Mass (lbs) 175.5 156 153   Total Body Water (lbs) 128.5 114 112

## 2014-04-08 NOTE — Progress Notes (Signed)
  Follow-up visit:  8 Weeks Post-Operative Gastric sleeve Surgery  Medical Nutrition Therapy:  Appt start time: 1430 end time:  1515  Primary concerns today: Post-operative Bariatric Surgery Nutrition Management. Pansy returns today with a 22.5 lb weight loss. She reports that she feels like she should be losing weight faster. She states that her tastes have changed, and she is unable to tolerate some seafood, protein shakes, and calcium supplements. Sojourner also reports that she is getting tired of meat and has been trying some carbs (saltines, chips, potato, banana). She is not meeting her protein and fluid needs.  Surgery date: 02/11/2014 Surgery type: Gastric Sleeve Start weight at [facility]: 380 on 07/17/13 Weight today: 320 lbs Weight change: 22.5 lbs Total weight loss: 60 lbs  TANITA  BODY COMP RESULTS  02/03/14 02/25/14 04/08/14   BMI (kg/m^2) 54.6 50.6 47.3   Fat Mass (lbs) 194.5 186.5 167   Fat Free Mass (lbs) 175.5 156 153   Total Body Water (lbs) 128.5 114 112     Preferred Learning Style:   No preference indicated   Learning Readiness:  Not ready  Contemplating  Ready  Change in progress  24-hr recall: B (AM): 1/2 premier protein shake (15g) Snk (AM):   L (PM): 2-3 oz chicken and egg (21-28g)  Snk ( PM): a few potato chips  D (PM): soup with potatoes and kale and 1 oz sausage (7g) Snk (PM):   Fluid intake: 40 oz Estimated total protein intake: 43-50 oz  Medications: none (needs BP meds managed, pans to make an appt) Supplementation: taking, having difficulty with taste  Using straws: yes (having gas pain) Drinking while eating: sips Hair loss: unknown Carbonated beverages: sparkling ICE water N/V/D/C: none regularly Dumping syndrome: none  Recent physical activity:  None recently, plans to return to walking and swimming  Progress Towards Goal(s):  In progress.  Handouts given during visit include:  Phase 3A lean protein  Phase 3B lean protein +  non-starchy vegetables   Nutritional Diagnosis:  [place]-3.3 Overweight/obesity related to past poor dietary habits and physical inactivity as evidenced by patient w/ recent gastric sleeve surgery following dietary guidelines for continued weight loss.     Intervention:  Nutrition counseling provided.  Samples provided and patient instructed on proper use:  Unjury protein powder (chicken soup flavor - qty 2) Lot#: [id number] Exp: 09/2014  Unjury protein powder (unflavored - qty 3) Lot#: [id number] Exp: 03/2015  Unjury protein powder (vanilla - qty 2) Lot #: [id number] Exp: 07/2015  Teaching Method Utilized:  Visual Auditory Hands on  Barriers to learning/adherence to lifestyle change: food aversions  Demonstrated degree of understanding via:  Teach Back   Monitoring/Evaluation:  Dietary intake, exercise, and body weight. Follow up in 1 months for 3 month post-op visit.

## 2014-04-10 NOTE — H&P (Deleted)
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  PAST MEDICAL HISTORY:  She had a tubal ligation.  PAST SURGICAL HISTORY:  Negative.  SOCIAL HISTORY:  Negative.  SYSTEM REVIEW:  Negative.  PHYSICAL EXAMINATION:  VITAL SIGNS/GENERAL:  Obese female, weighing 330 pounds, in no distress.  HEENT:  Negative.  LUNGS:  Clear to P and A. HEART:  Regular rhythm.  No murmurs, no gallops.  Breasts:  Pendulous. Abdomen:  Massively obese.  Uterus normal, negative adnexa. GENITOURINARY:  External genitalia normal.  Pap normal.  EXTREMITIES: Negative.          ______________________________ Frederico Hamman, M.D.     BAM/MEDQ  D:  04/10/2014  T:  04/10/2014  Job:  [id number]

## 2014-04-10 NOTE — H&P (Deleted)
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  PAST MEDICAL HISTORY:  She had a tubal ligation.  PAST SURGICAL HISTORY:  Negative.  SOCIAL HISTORY:  Negative.  SYSTEM REVIEW:  Negative.  PHYSICAL EXAMINATION:  VITAL SIGNS/GENERAL:  Obese female, weighing 330 pounds, in no distress.  HEENT:  Negative.  LUNGS:  Clear to P and A. HEART:  Regular rhythm.  No murmurs, no gallops.  Breasts:  Pendulous. Abdomen:  Massively obese.  Uterus normal, negative adnexa. GENITOURINARY:  External genitalia normal.  Pap normal.  EXTREMITIES: Negative.          ______________________________ Frederico Hamman, M.D.     BAM/MEDQ  D:  04/10/2014  T:  04/10/2014  Job:  [id number]

## 2014-04-10 NOTE — H&P (Signed)
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  PAST MEDICAL HISTORY:  She had a tubal ligation.  PAST SURGICAL HISTORY:  Negative.  SOCIAL HISTORY:  Negative.  SYSTEM REVIEW:  Negative.  PHYSICAL EXAMINATION:  VITAL SIGNS/GENERAL:  Obese female, weighing 330 pounds, in no distress.  HEENT:  Negative.  LUNGS:  Clear to P and A. HEART:  Regular rhythm.  No murmurs, no gallops.  Breasts:  Pendulous. Abdomen:  Massively obese.  Uterus normal, negative adnexa. GENITOURINARY:  External genitalia normal.  Pap normal.  EXTREMITIES: Negative.          ______________________________ Frederico Hamman, M.D.     BAM/MEDQ  D:  04/10/2014  T:  04/10/2014  Job:  [id number]

## 2014-04-14 ENCOUNTER — Encounter (HOSPITAL_COMMUNITY): Payer: Self-pay | Admitting: Pharmacist

## 2014-04-15 ENCOUNTER — Encounter (HOSPITAL_COMMUNITY): Payer: Self-pay

## 2014-04-15 ENCOUNTER — Encounter (HOSPITAL_COMMUNITY)
Admission: RE | Admit: 2014-04-15 | Discharge: 2014-04-15 | Disposition: A | Discharge status: Home / Self Care | Payer: BC Managed Care – PPO | Source: Ambulatory Visit | Attending: Obstetrics | Admitting: Obstetrics

## 2014-04-15 LAB — BASIC METABOLIC PANEL
BUN: 9 mg/dL (ref 6–23)
CHLORIDE: 99 meq/L (ref 96–112)
CO2: 30 mEq/L (ref 19–32)
CREATININE: 0.88 mg/dL (ref 0.50–1.10)
Calcium: 9.7 mg/dL (ref 8.4–10.5)
GFR calc non Af Amer: 82 mL/min — ABNORMAL LOW (ref >90)
Glucose, Bld: 108 mg/dL — ABNORMAL HIGH (ref 70–99)
Potassium: 3.7 mEq/L (ref 3.7–5.3)
Sodium: 140 mEq/L (ref 137–147)

## 2014-04-15 LAB — CBC
HCT: 39.3 % (ref 36.0–46.0)
Hemoglobin: 12.8 g/dL (ref 12.0–15.0)
MCH: 27 pg (ref 26.0–34.0)
MCHC: 32.6 g/dL (ref 30.0–36.0)
MCV: 82.9 fL (ref 78.0–100.0)
PLATELETS: 229 10*3/uL (ref 150–400)
RBC: 4.74 MIL/uL (ref 3.87–5.11)
RDW: 15.3 % (ref 11.5–15.5)
WBC: 6.9 10*3/uL (ref 4.0–10.5)

## 2014-04-15 NOTE — Patient Instructions (Signed)
   Your procedure is scheduled on: June 24 [facility])   Enter through the Main Entrance of [facility] at:11AM [place] up the phone at the desk and dial [phone] and inform us of your arrival.  Please call this number if you have any problems the morning of surgery: [phone]  Remember: Do not eat food after midnight: Do not drink clear liquids after:AFTER 8AM DAY OF SURGERY Take these medicines the morning of surgery with a SIP OF WATER:  Do not wear jewelry, make-up, or FINGER nail polish No metal in your hair or on your body. Do not wear lotions, powders, perfumes.  You may wear deodorant.  Do not bring valuables to the hospital. Contacts, dentures or bridgework may not be worn into surgery.  Leave suitcase in the car. After Surgery it may be brought to your room. For patients being admitted to the hospital, checkout time is 11:00am the day of discharge.    Patients discharged on the day of surgery will not be allowed to drive home.

## 2014-04-16 ENCOUNTER — Encounter (HOSPITAL_COMMUNITY)
Admission: RE | Disposition: A | Discharge status: Home / Self Care | Payer: Self-pay | Source: Ambulatory Visit | Attending: Obstetrics

## 2014-04-16 ENCOUNTER — Ambulatory Visit (HOSPITAL_COMMUNITY)
Admission: RE | Admit: 2014-04-16 | Discharge: 2014-04-16 | Disposition: A | Discharge status: Home / Self Care | Payer: BC Managed Care – PPO | Source: Ambulatory Visit | Attending: Obstetrics | Admitting: Obstetrics

## 2014-04-16 ENCOUNTER — Ambulatory Visit (HOSPITAL_COMMUNITY): Payer: BC Managed Care – PPO | Admitting: Anesthesiology

## 2014-04-16 ENCOUNTER — Encounter (HOSPITAL_COMMUNITY): Payer: Self-pay | Admitting: Certified Registered"

## 2014-04-16 ENCOUNTER — Encounter (HOSPITAL_COMMUNITY): Payer: BC Managed Care – PPO | Admitting: Anesthesiology

## 2014-04-16 DIAGNOSIS — I10 Essential (primary) hypertension: Secondary | ICD-10-CM | POA: Insufficient documentation

## 2014-04-16 DIAGNOSIS — Z6841 Body Mass Index (BMI) 40.0 and over, adult: Secondary | ICD-10-CM | POA: Insufficient documentation

## 2014-04-16 DIAGNOSIS — K449 Diaphragmatic hernia without obstruction or gangrene: Secondary | ICD-10-CM | POA: Insufficient documentation

## 2014-04-16 DIAGNOSIS — D259 Leiomyoma of uterus, unspecified: Secondary | ICD-10-CM | POA: Insufficient documentation

## 2014-04-16 DIAGNOSIS — N938 Other specified abnormal uterine and vaginal bleeding: Secondary | ICD-10-CM | POA: Insufficient documentation

## 2014-04-16 DIAGNOSIS — E039 Hypothyroidism, unspecified: Secondary | ICD-10-CM | POA: Insufficient documentation

## 2014-04-16 DIAGNOSIS — Z87891 Personal history of nicotine dependence: Secondary | ICD-10-CM | POA: Insufficient documentation

## 2014-04-16 DIAGNOSIS — K219 Gastro-esophageal reflux disease without esophagitis: Secondary | ICD-10-CM | POA: Insufficient documentation

## 2014-04-16 DIAGNOSIS — N949 Unspecified condition associated with female genital organs and menstrual cycle: Secondary | ICD-10-CM | POA: Insufficient documentation

## 2014-04-16 HISTORY — PX: DILITATION & CURRETTAGE/HYSTROSCOPY WITH HYDROTHERMAL ABLATION: SHX5570

## 2014-04-16 LAB — GLUCOSE, CAPILLARY: Glucose-Capillary: 80 mg/dL (ref 70–99)

## 2014-04-16 SURGERY — DILATATION & CURETTAGE/HYSTEROSCOPY WITH HYDROTHERMAL ABLATION
Anesthesia: General

## 2014-04-16 MED ORDER — FENTANYL CITRATE 0.05 MG/ML IJ SOLN
25.0000 ug | INTRAMUSCULAR | Status: DC | PRN
  Administered 2014-04-16: 50 ug via INTRAVENOUS

## 2014-04-16 MED ORDER — ONDANSETRON HCL 4 MG/2ML IJ SOLN
INTRAMUSCULAR | Status: AC
  Filled 2014-04-16: qty 2

## 2014-04-16 MED ORDER — LIDOCAINE HCL (CARDIAC) 20 MG/ML IV SOLN
INTRAVENOUS | Status: AC
  Filled 2014-04-16: qty 5

## 2014-04-16 MED ORDER — MEPERIDINE HCL 25 MG/ML IJ SOLN: 6.2500 mg | INTRAMUSCULAR | Status: DC | PRN

## 2014-04-16 MED ORDER — SODIUM CHLORIDE 0.9 % IR SOLN
Status: DC | PRN
  Administered 2014-04-16: 1

## 2014-04-16 MED ORDER — OXYCODONE-ACETAMINOPHEN 5-325 MG PO TABS
1.0000 | ORAL_TABLET | Freq: Once | ORAL | Status: AC
  Administered 2014-04-16: 1 via ORAL

## 2014-04-16 MED ORDER — KETOROLAC TROMETHAMINE 30 MG/ML IJ SOLN
INTRAMUSCULAR | Status: AC
  Filled 2014-04-16: qty 1

## 2014-04-16 MED ORDER — LIDOCAINE HCL (CARDIAC) 20 MG/ML IV SOLN
INTRAVENOUS | Status: DC | PRN
  Administered 2014-04-16: 80 mg via INTRAVENOUS

## 2014-04-16 MED ORDER — OXYCODONE-ACETAMINOPHEN 5-325 MG PO TABS
ORAL_TABLET | ORAL | Status: AC
  Filled 2014-04-16: qty 1

## 2014-04-16 MED ORDER — KETOROLAC TROMETHAMINE 30 MG/ML IJ SOLN: 15.0000 mg | Freq: Once | INTRAMUSCULAR | Status: DC | PRN

## 2014-04-16 MED ORDER — PROPOFOL 10 MG/ML IV EMUL
INTRAVENOUS | Status: AC
  Filled 2014-04-16: qty 20

## 2014-04-16 MED ORDER — KETOROLAC TROMETHAMINE 30 MG/ML IJ SOLN
INTRAMUSCULAR | Status: DC | PRN
  Administered 2014-04-16: 30 mg via INTRAVENOUS

## 2014-04-16 MED ORDER — DEXAMETHASONE SODIUM PHOSPHATE 10 MG/ML IJ SOLN
INTRAMUSCULAR | Status: AC
  Filled 2014-04-16: qty 1

## 2014-04-16 MED ORDER — MIDAZOLAM HCL 2 MG/2ML IJ SOLN
INTRAMUSCULAR | Status: DC | PRN
  Administered 2014-04-16: 2 mg via INTRAVENOUS

## 2014-04-16 MED ORDER — FENTANYL CITRATE 0.05 MG/ML IJ SOLN
INTRAMUSCULAR | Status: AC
  Filled 2014-04-16: qty 2

## 2014-04-16 MED ORDER — LIDOCAINE HCL 1 % IJ SOLN
INTRAMUSCULAR | Status: DC | PRN
  Administered 2014-04-16: 10 mL

## 2014-04-16 MED ORDER — FENTANYL CITRATE 0.05 MG/ML IJ SOLN
INTRAMUSCULAR | Status: DC | PRN
  Administered 2014-04-16 (×2): 50 ug via INTRAVENOUS

## 2014-04-16 MED ORDER — PROMETHAZINE HCL 25 MG/ML IJ SOLN: 6.2500 mg | INTRAMUSCULAR | Status: DC | PRN

## 2014-04-16 MED ORDER — LIDOCAINE HCL 1 % IJ SOLN
INTRAMUSCULAR | Status: AC
  Filled 2014-04-16: qty 20

## 2014-04-16 MED ORDER — MIDAZOLAM HCL 2 MG/2ML IJ SOLN: 0.5000 mg | Freq: Once | INTRAMUSCULAR | Status: DC | PRN

## 2014-04-16 MED ORDER — MIDAZOLAM HCL 2 MG/2ML IJ SOLN
INTRAMUSCULAR | Status: AC
  Filled 2014-04-16: qty 2

## 2014-04-16 MED ORDER — DEXAMETHASONE SODIUM PHOSPHATE 10 MG/ML IJ SOLN
INTRAMUSCULAR | Status: DC | PRN
  Administered 2014-04-16: 4 mg via INTRAVENOUS

## 2014-04-16 MED ORDER — ONDANSETRON HCL 4 MG/2ML IJ SOLN
INTRAMUSCULAR | Status: DC | PRN
  Administered 2014-04-16 (×2): 4 mg via INTRAVENOUS

## 2014-04-16 MED ORDER — PROPOFOL 10 MG/ML IV BOLUS
INTRAVENOUS | Status: DC | PRN
  Administered 2014-04-16: 100 mg via INTRAVENOUS
  Administered 2014-04-16: 200 mg via INTRAVENOUS

## 2014-04-16 MED ORDER — LACTATED RINGERS IV SOLN
INTRAVENOUS | Status: DC
  Administered 2014-04-16 (×2): via INTRAVENOUS

## 2014-04-16 SURGICAL SUPPLY — 13 items
CATH ROBINSON RED A/P 16FR (CATHETERS) ×3 IMPLANT
CLOTH BEACON ORANGE TIMEOUT ST (SAFETY) ×3 IMPLANT
CONTAINER PREFILL 10% NBF 60ML (FORM) ×6 IMPLANT
DRAPE HYSTEROSCOPY (DRAPE) ×3 IMPLANT
DRSG TELFA 3X8 NADH (GAUZE/BANDAGES/DRESSINGS) ×3 IMPLANT
GLOVE BIO SURGEON STRL SZ8.5 (GLOVE) ×3 IMPLANT
GOWN STRL REUS W/TWL 2XL LVL3 (GOWN DISPOSABLE) ×3 IMPLANT
GOWN STRL REUS W/TWL LRG LVL3 (GOWN DISPOSABLE) ×3 IMPLANT
PACK VAGINAL MINOR WOMEN LF (CUSTOM PROCEDURE TRAY) ×3 IMPLANT
PAD DRESSING TELFA 3X8 NADH (GAUZE/BANDAGES/DRESSINGS) ×1 IMPLANT
PAD OB MATERNITY 4.3X12.25 (PERSONAL CARE ITEMS) ×3 IMPLANT
SET GENESYS HTA PROCERVA (MISCELLANEOUS) ×3 IMPLANT
TOWEL OR 17X24 6PK STRL BLUE (TOWEL DISPOSABLE) ×6 IMPLANT

## 2014-04-16 NOTE — Anesthesia Preprocedure Evaluation (Signed)
Anesthesia Evaluation  Patient identified by MRN, date of birth, ID band Patient awake    Reviewed: Allergy & Precautions, H&P , Patient's Chart, lab work & pertinent test results, reviewed documented beta blocker date and time   History of Anesthesia Complications Negative for: history of anesthetic complications  Airway Mallampati: III TM Distance: >3 FB Neck ROM: full    Dental   Pulmonary former smoker,  breath sounds clear to auscultation        Cardiovascular Exercise Tolerance: Good hypertension, Rhythm:regular Rate:Normal     Neuro/Psych negative psych ROS   GI/Hepatic hiatal hernia, GERD-  Controlled,  Endo/Other  Hypothyroidism Morbid obesity  Renal/GU      Musculoskeletal   Abdominal   Peds  Hematology   Anesthesia Other Findings   Reproductive/Obstetrics                           Anesthesia Physical Anesthesia Plan  ASA: III  Anesthesia Plan: General LMA   Post-op Pain Management:    Induction:   Airway Management Planned:   Additional Equipment:   Intra-op Plan:   Post-operative Plan:   Informed Consent: I have reviewed the patients History and Physical, chart, labs and discussed the procedure including the risks, benefits and alternatives for the proposed anesthesia with the patient or authorized representative who has indicated his/her understanding and acceptance.   Dental Advisory Given  Plan Discussed with: CRNA, Surgeon and Anesthesiologist  Anesthesia Plan Comments:         Anesthesia Quick Evaluation

## 2014-04-16 NOTE — Transfer of Care (Signed)
Immediate Anesthesia Transfer of Care Note  Patient: Haley Wyatt  Procedure(s) Performed: Procedure(s): DILATATION & CURETTAGE/HYSTEROSCOPY WITH HYDROTHERMAL ABLATION (N/A)  Patient Location: PACU  Anesthesia Type:General  Level of Consciousness: awake, alert  and oriented  Airway & Oxygen Therapy: Patient Spontanous Breathing and Patient connected to nasal cannula oxygen  Post-op Assessment: Report given to PACU RN, Post -op Vital signs reviewed and stable and Patient moving all extremities  Post vital signs: Reviewed and stable  Complications: No apparent anesthesia complications

## 2014-04-16 NOTE — Op Note (Signed)
Diagnosis dysfunctional uterine bleeding and myoma uteri Procedure hysteroscopy D&C HTA Postop diagnosis the same Anesthesia Gen. 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

## 2014-04-16 NOTE — Anesthesia Postprocedure Evaluation (Signed)
Anesthesia Post Note  Patient: Haley Wyatt  Procedure(s) Performed: Procedure(s) (LRB): DILATATION & CURETTAGE/HYSTEROSCOPY WITH HYDROTHERMAL ABLATION (N/A)  Anesthesia type: GA  Patient location: PACU  Post pain: Pain level controlled  Post assessment: Post-op Vital signs reviewed  Last Vitals:  Filed Vitals:   04/16/14 1310  BP:   Pulse:   Temp: 36.5 C  Resp:     Post vital signs: Reviewed  Level of consciousness: sedated  Complications: No apparent anesthesia complications

## 2014-04-16 NOTE — H&P (Signed)
  There has been no change in her history since original dictation

## 2014-04-17 ENCOUNTER — Encounter (HOSPITAL_COMMUNITY): Payer: Self-pay | Admitting: Obstetrics

## 2014-05-08 ENCOUNTER — Encounter (INDEPENDENT_AMBULATORY_CARE_PROVIDER_SITE_OTHER): Payer: Self-pay | Admitting: Surgery

## 2014-05-08 ENCOUNTER — Ambulatory Visit (INDEPENDENT_AMBULATORY_CARE_PROVIDER_SITE_OTHER): Payer: BC Managed Care – PPO | Admitting: Surgery

## 2014-05-08 VITALS — BP 134/72 | HR 80 | Temp 97.5°F | Ht 69.0 in | Wt 316.2 lb

## 2014-05-08 DIAGNOSIS — Z9884 Bariatric surgery status: Secondary | ICD-10-CM

## 2014-05-08 NOTE — Patient Instructions (Signed)
Get back on the diet that you know to follow

## 2014-05-08 NOTE — Progress Notes (Signed)
Haley Wyatt 38 y.o.  Body mass index is 46.67 kg/(m^2).  Patient Active Problem List   Diagnosis Date Noted  . 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 Eustachian tube dysfunction 01/01/2014  . Lower back pain 11/08/2013  . Vaginal discharge 07/16/2013  . Headache 07/16/2013  . Serous otitis media 07/16/2013  . Fatigue 07/16/2013  . Encounter for routine gynecological examination 07/16/2013  . Morbid obesity 07/16/2013  . Pre-diabetes 01/31/2013  . Menorrhagia 05/23/2012  . Bacterial vaginosis 05/23/2012  . 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 Left shoulder pain 10/28/2011  . 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 HYPERTENSION 08/16/2010  . 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 LEG EDEMA, 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  . GERD 06/11/2010  . HYPOTHYROIDISM 04/16/2009  . OBESITY 04/16/2009  . DEPRESSION 03/30/2009    No Known Allergies    Past Surgical History  Procedure Laterality Date  . Shoulder surgery      left shoulder loose body removal  . Carpal tunnel release    . Tubal ligation    . Laparoscopic gastric sleeve resection N/A 02/11/2014    Procedure: LAPAROSCOPIC GASTRIC SLEEVE RESECTION;  Surgeon: Pedro Earls, MD;  Location: [facility];  Service: General;  Laterality: N/A;  . Hiatal hernia repair N/A 02/11/2014    Procedure: LAPAROSCOPIC REPAIR OF HIATAL HERNIA;  Surgeon: Pedro Earls, MD;  Location: [facility];  Service: General;  Laterality: N/A;  . Upper gi endoscopy  02/11/2014    Procedure: UPPER GI ENDOSCOPY;  Surgeon: Pedro Earls, MD;  Location: [facility];  Service: General;;  . Dilitation & currettage/hystroscopy with hydrothermal ablation N/A 04/16/2014    Procedure: DILATATION & CURETTAGE/HYSTEROSCOPY WITH HYDROTHERMAL ABLATION;  Surgeon: Frederico Hamman, MD;  Location: [facility];  Service: Gynecology;  Laterality: N/A;   [name], MD No diagnosis  found.  Doing well and is 50 lbs down from surgery.  Has been moving office and home and under stress and has been eating junk foods. Will see back in October and check labs at that time.  Matt B. Hassell Done, MD, [facility] [phone] beeper [phone]  05/08/2014 11:14 AM

## 2014-05-10 ENCOUNTER — Encounter (HOSPITAL_COMMUNITY): Payer: Self-pay | Admitting: Emergency Medicine

## 2014-05-10 ENCOUNTER — Emergency Department (HOSPITAL_COMMUNITY)
Admission: EM | Admit: 2014-05-10 | Discharge: 2014-05-10 | Disposition: A | Discharge status: Home / Self Care | Payer: BC Managed Care – PPO | Source: Home / Self Care | Attending: Emergency Medicine | Admitting: Emergency Medicine

## 2014-05-10 ENCOUNTER — Other Ambulatory Visit (HOSPITAL_COMMUNITY)
Admission: RE | Admit: 2014-05-10 | Discharge: 2014-05-10 | Disposition: A | Discharge status: Home / Self Care | Payer: BC Managed Care – PPO | Source: Ambulatory Visit | Attending: Emergency Medicine | Admitting: Emergency Medicine

## 2014-05-10 DIAGNOSIS — Z113 Encounter for screening for infections with a predominantly sexual mode of transmission: Secondary | ICD-10-CM | POA: Insufficient documentation

## 2014-05-10 DIAGNOSIS — N719 Inflammatory disease of uterus, unspecified: Secondary | ICD-10-CM

## 2014-05-10 DIAGNOSIS — N760 Acute vaginitis: Secondary | ICD-10-CM | POA: Insufficient documentation

## 2014-05-10 DIAGNOSIS — J018 Other acute sinusitis: Secondary | ICD-10-CM

## 2014-05-10 LAB — POCT URINALYSIS DIP (DEVICE)
GLUCOSE, UA: NEGATIVE mg/dL
KETONES UR: NEGATIVE mg/dL
Nitrite: NEGATIVE
Protein, ur: 100 mg/dL — AB
Specific Gravity, Urine: 1.02 (ref 1.005–1.030)
Urobilinogen, UA: 1 mg/dL (ref 0.0–1.0)
pH: 6 (ref 5.0–8.0)

## 2014-05-10 LAB — POCT PREGNANCY, URINE: PREG TEST UR: NEGATIVE

## 2014-05-10 LAB — POCT RAPID STREP A: Streptococcus, Group A Screen (Direct): NEGATIVE

## 2014-05-10 MED ORDER — HYDROCODONE-ACETAMINOPHEN 5-325 MG PO TABS: 1.0000 | ORAL_TABLET | Freq: Four times a day (QID) | ORAL | Status: DC | PRN

## 2014-05-10 MED ORDER — ONDANSETRON 8 MG PO TBDP: 8.0000 mg | ORAL_TABLET | Freq: Three times a day (TID) | ORAL | Status: DC | PRN

## 2014-05-10 MED ORDER — LIDOCAINE HCL (PF) 1 % IJ SOLN
INTRAMUSCULAR | Status: AC
  Filled 2014-05-10: qty 5

## 2014-05-10 MED ORDER — FLUTICASONE PROPIONATE 50 MCG/ACT NA SUSP: 2.0000 | Freq: Two times a day (BID) | NASAL | Status: DC

## 2014-05-10 MED ORDER — METRONIDAZOLE 500 MG PO TABS: 500.0000 mg | ORAL_TABLET | Freq: Two times a day (BID) | ORAL | Status: DC

## 2014-05-10 MED ORDER — CEFTRIAXONE SODIUM 1 G IJ SOLR
INTRAMUSCULAR | Status: AC
  Filled 2014-05-10: qty 10

## 2014-05-10 MED ORDER — DOXYCYCLINE HYCLATE 100 MG PO CAPS: 100.0000 mg | ORAL_CAPSULE | Freq: Two times a day (BID) | ORAL | Status: DC

## 2014-05-10 MED ORDER — CEFTRIAXONE SODIUM 1 G IJ SOLR
1.0000 g | Freq: Once | INTRAMUSCULAR | Status: AC
  Administered 2014-05-10: 1 g via INTRAMUSCULAR

## 2014-05-10 NOTE — Discharge Instructions (Signed)
Endometritis Endometritis is an irritation, soreness, and swelling (inflammation) of the lining of the uterus (endometrium).  CAUSES   Bacterial infections.  Sexually transmitted infections (STIs).  Having a miscarriage or childbirth, especially after a long labor or cesarean delivery.  Certain gynecological procedures (such as dilation and curettage, hysteroscopy, or contraceptive insertion). SIGNS AND SYMPTOMS   Fever.  Lower abdominal or pelvic pain.  Abnormal vaginal discharge or bleeding.  Abdominal bloating (distention) or swelling.  General discomfort or ill feeling.  Discomfort with bowel movements. DIAGNOSIS  A physical and pelvic exam are performed. Other tests may include:  Cultures from the cervix.  Blood tests.  Examining a tissue sample of the uterine lining (endometrial biopsy).  Examining discharge under a microscope (wet prep).  Laparoscopy. TREATMENT  Antibiotic medicines are usually given. Other treatments may include:  Fluids through an IV tube inserted in your vein.  Rest. HOME CARE INSTRUCTIONS   Take over-the-counter or prescription medicines for pain, discomfort, or fever as directed by your health care provider.  Take your antibiotics as directed. Finish them even if you start to feel better.  Resume your normal diet and activities as directed or as tolerated.  Do not douche or have sexual intercourse until your health care provider says it is okay.  Do not have sexual intercourse until your partner has been treated if your endometritis is caused by an STI. SEEK IMMEDIATE MEDICAL CARE IF:   You have swelling or increasing pain in the abdomen.  You have a fever.  You have bad smelling vaginal discharge, or you have an increased amount of discharge.  You have abnormal vaginal bleeding.  Your medicine is not helping with the pain.  You experience any problems that may be related to the medicine you are taking.  You have nausea  and vomiting, or you cannot keep foods down.  You have pain with bowel movements. MAKE SURE YOU:   Understand these instructions.  Will watch your condition.  Will get help right away if you are not doing well or get worse. Document Released: 10/04/2001 Document Revised: 06/12/2013 Document Reviewed: 05/09/2013 [facility] Patient Information 2015 [place], [place]. This information is not intended to replace advice given to you by your health care provider. Make sure you discuss any questions you have with your health care provider.  Sinusitis Sinusitis is redness, soreness, and swelling (inflammation) of the paranasal sinuses. Paranasal sinuses are air pockets within the bones of your face (beneath the eyes, the middle of the forehead, or above the eyes). In healthy paranasal sinuses, mucus is able to drain out, and air is able to circulate through them by way of your nose. However, when your paranasal sinuses are inflamed, mucus and air can become trapped. This can allow bacteria and other germs to grow and cause infection. Sinusitis can develop quickly and last only a short time (acute) or continue over a long period (chronic). Sinusitis that lasts for more than 12 weeks is considered chronic.  CAUSES  Causes of sinusitis include:  Allergies.  Structural abnormalities, such as displacement of the cartilage that separates your nostrils (deviated septum), which can decrease the air flow through your nose and sinuses and affect sinus drainage.  Functional abnormalities, such as when the small hairs (cilia) that line your sinuses and help remove mucus do not work properly or are not present. SYMPTOMS  Symptoms of acute and chronic sinusitis are the same. The primary symptoms are pain and pressure around the affected sinuses. Other symptoms  include:  Upper toothache.  Earache.  Headache.  Bad breath.  Decreased sense of smell and taste.  A cough, which worsens when you are lying  flat.  Fatigue.  Fever.  Thick drainage from your nose, which often is green and may contain pus (purulent).  Swelling and warmth over the affected sinuses. DIAGNOSIS  Your caregiver will perform a physical exam. During the exam, your caregiver may:  Look in your nose for signs of abnormal growths in your nostrils (nasal polyps).  Tap over the affected sinus to check for signs of infection.  View the inside of your sinuses (endoscopy) with a special imaging device with a light attached (endoscope), which is inserted into your sinuses. If your caregiver suspects that you have chronic sinusitis, one or more of the following tests may be recommended:  Allergy tests.  Nasal culture--A sample of mucus is taken from your nose and sent to a lab and screened for bacteria.  Nasal cytology--A sample of mucus is taken from your nose and examined by your caregiver to determine if your sinusitis is related to an allergy. TREATMENT  Most cases of acute sinusitis are related to a viral infection and will resolve on their own within 10 days. Sometimes medicines are prescribed to help relieve symptoms (pain medicine, decongestants, nasal steroid sprays, or saline sprays).  However, for sinusitis related to a bacterial infection, your caregiver will prescribe antibiotic medicines. These are medicines that will help kill the bacteria causing the infection.  Rarely, sinusitis is caused by a fungal infection. In theses cases, your caregiver will prescribe antifungal medicine. For some cases of chronic sinusitis, surgery is needed. Generally, these are cases in which sinusitis recurs more than 3 times per year, despite other treatments. HOME CARE INSTRUCTIONS   Drink plenty of water. Water helps thin the mucus so your sinuses can drain more easily.  Use a humidifier.  Inhale steam 3 to 4 times a day (for example, sit in the bathroom with the shower running).  Apply a warm, moist washcloth to your face  3 to 4 times a day, or as directed by your caregiver.  Use saline nasal sprays to help moisten and clean your sinuses.  Take over-the-counter or prescription medicines for pain, discomfort, or fever only as directed by your caregiver. SEEK IMMEDIATE MEDICAL CARE IF:  You have increasing pain or severe headaches.  You have nausea, vomiting, or drowsiness.  You have swelling around your face.  You have vision problems.  You have a stiff neck.  You have difficulty breathing. MAKE SURE YOU:   Understand these instructions.  Will watch your condition.  Will get help right away if you are not doing well or get worse. Document Released: 10/10/2005 Document Revised: 01/02/2012 Document Reviewed: 10/25/2011 [facility] Patient Information 2015 [place], [place]. This information is not intended to replace advice given to you by your health care provider. Make sure you discuss any questions you have with your health care provider.

## 2014-05-10 NOTE — ED Notes (Signed)
S/S allergic reaction reviewed w/ pt.  Comfort measures offered.

## 2014-05-10 NOTE — ED Notes (Signed)
Started with sore throat 2 days ago, which progressed with fever, nasal congestion, pressure behind eyes, head pressure, body aches.  Has been taking Tyl.  Also had endometrial ablation 3 wks ago; started with very malodorous discharge approx 1 wk ago - has f/u appt in 2 days.  C/O "a little" low abd pain.  + nausea, denies v/d.

## 2014-05-10 NOTE — ED Provider Notes (Signed)
CSN: [id number]     Arrival date & time 05/10/14  1204 History   First MD Initiated Contact with Patient 05/10/14 1219     Chief Complaint  Patient presents with  . Sore Throat  . Fever  . Vaginal Discharge   (Consider location/radiation/quality/duration/timing/severity/associated sxs/prior Treatment) HPI Comments: 38 year old female presents with 2 complaints. For 3 days she has had progressively worsening sore throat with sinus pressure, nasal congestion, pressure behind her eyes, headache, body aches, and fever to 102. she had a fever as recently as this morning that has gone down with Tylenol. She admits to a slight dry cough as well. She thinks she may have a sinus infection. Also, she complains of malodorous vaginal discharge and lower abdominal pain for at least a week. She had an endometrial ablation 3 weeks ago, she worries that she may have some infection. She questions whether the fever is related to that with a sinus infection. No hematuria or dysuria, or flank pain. Abdominal pain was very low, midline, and mild. She has a followup with her gynecologist in 2 days.  Patient is a 38 y.o. female presenting with pharyngitis, fever, and vaginal discharge.  Sore Throat Associated symptoms include abdominal pain. Pertinent negatives include no chest pain and no shortness of breath.  Fever Associated symptoms: chills, congestion, ear pain, nausea, rhinorrhea and sore throat   Associated symptoms: no chest pain, no cough, no diarrhea, no dysuria, no myalgias, no rash and no vomiting   Vaginal Discharge Associated symptoms: abdominal pain, fever and nausea   Associated symptoms: no dysuria and no vomiting     Past Medical History  Diagnosis Date  . Hypertension   . Allergy   . Fibroid tumor     inside uterus  . Back pain     "muscle strain from exercising"  . GERD (gastroesophageal reflux disease)   . H/O hiatal hernia   . Borderline diabetes     states "levels have been about  102"   Past Surgical History  Procedure Laterality Date  . Shoulder surgery      left shoulder loose body removal  . Carpal tunnel release    . Tubal ligation    . Laparoscopic gastric sleeve resection N/A 02/11/2014    Procedure: LAPAROSCOPIC GASTRIC SLEEVE RESECTION;  Surgeon: Pedro Earls, MD;  Location: [facility];  Service: General;  Laterality: N/A;  . Hiatal hernia repair N/A 02/11/2014    Procedure: LAPAROSCOPIC REPAIR OF HIATAL HERNIA;  Surgeon: Pedro Earls, MD;  Location: [facility];  Service: General;  Laterality: N/A;  . Upper gi endoscopy  02/11/2014    Procedure: UPPER GI ENDOSCOPY;  Surgeon: Pedro Earls, MD;  Location: [facility];  Service: General;;  . Dilitation & currettage/hystroscopy with hydrothermal ablation N/A 04/16/2014    Procedure: DILATATION & CURETTAGE/HYSTEROSCOPY WITH HYDROTHERMAL ABLATION;  Surgeon: Frederico Hamman, MD;  Location: [facility];  Service: Gynecology;  Laterality: N/A;   Family History  Problem Relation Age of Onset  . Hyperlipidemia Father   . Hypertension Father   . Diabetes Father   . Sudden death Neg Hx   . Heart attack Neg Hx   . Cancer Mother    History  Substance Use Topics  . Smoking status: Former Smoker    Types: Cigarettes    Quit date: 02/05/2006  . Smokeless tobacco: Not on file  . Alcohol Use: No   OB History   Grav Para Term Preterm Abortions TAB SAB Ect Mult Living  Review of Systems  Constitutional: Positive for fever, chills and fatigue.  HENT: Positive for congestion, ear pain, postnasal drip, rhinorrhea, sinus pressure and sore throat. Negative for nosebleeds.   Eyes: Negative for visual disturbance.  Respiratory: Negative for cough and shortness of breath.   Cardiovascular: Negative for chest pain, palpitations and leg swelling.  Gastrointestinal: Positive for nausea and abdominal pain. Negative for vomiting and diarrhea.  Endocrine: Negative for polydipsia and polyuria.  Genitourinary:  Positive for vaginal discharge and pelvic pain. Negative for dysuria, urgency, frequency, flank pain and vaginal pain.  Musculoskeletal: Negative for arthralgias and myalgias.  Skin: Negative for rash.  Neurological: Negative for dizziness, weakness and light-headedness.  All other systems reviewed and are negative.   Allergies  Review of patient's allergies indicates no known allergies.  Home Medications   Prior to Admission medications   Medication Sig Start Date End Date Taking? Authorizing Provider  doxycycline (VIBRAMYCIN) 100 MG capsule Take 1 capsule (100 mg total) by mouth 2 (two) times daily. 05/10/14   Freeman Caldron Capria Cartaya, PA-C  fluticasone (FLONASE) 50 MCG/ACT nasal spray Place 2 sprays into both nostrils 2 (two) times daily. Decrease to 2 sprays/nostril daily after 5 days 05/10/14   Liam Graham, PA-C  HYDROcodone-acetaminophen (NORCO) 5-325 MG per tablet Take 1 tablet by mouth every 6 (six) hours as needed for moderate pain. 05/10/14   Liam Graham, PA-C  lisinopril-hydrochlorothiazide (PRINZIDE,ZESTORETIC) 20-12.5 MG per tablet Take 1 tablet by mouth daily.    Historical Provider, MD  metroNIDAZOLE (FLAGYL) 500 MG tablet Take 1 tablet (500 mg total) by mouth 2 (two) times daily. 05/10/14   Liam Graham, PA-C  ondansetron (ZOFRAN ODT) 8 MG disintegrating tablet Take 1 tablet (8 mg total) by mouth every 8 (eight) hours as needed for nausea or vomiting. 05/10/14   Liam Graham, PA-C   BP 109/71  Pulse 90  Temp(Src) 98.2 F (36.8 C) (Oral)  Resp 20  SpO2 98%  LMP 04/28/2014 Physical Exam  Nursing note and vitals reviewed. Constitutional: She is oriented to person, place, and time. Vital signs are normal. She appears well-developed and well-nourished. No distress.  HENT:  Head: Normocephalic and atraumatic.  Cardiovascular: Normal rate and normal heart sounds.   Pulmonary/Chest: Effort normal and breath sounds normal. No respiratory distress. She has no wheezes. She has  no rales.  Abdominal: Soft. Normal appearance and bowel sounds are normal. There is tenderness in the suprapubic area. There is no rigidity, no rebound, no guarding, no CVA tenderness, no tenderness at McBurney's point and negative Murphy's sign. No hernia.  Genitourinary: No erythema, tenderness or bleeding around the vagina. No foreign body around the vagina. Vaginal discharge (brown. Malodorous) found.  Neurological: She is alert and oriented to person, place, and time. She has normal strength. Coordination normal.  Skin: Skin is warm and dry. No rash noted. She is not diaphoretic.  Psychiatric: She has a normal mood and affect. Judgment normal.    ED Course  Procedures (including critical care time) Labs Review Labs Reviewed  POCT URINALYSIS DIP (DEVICE) - Abnormal; Notable for the following:    Bilirubin Urine SMALL (*)    Hgb urine dipstick LARGE (*)    Protein, ur 100 (*)    Leukocytes, UA TRACE (*)    All other components within normal limits  URINE CULTURE  POCT RAPID STREP A ([facility] CARE ONLY)  POCT PREGNANCY, URINE  CERVICOVAGINAL ANCILLARY ONLY    Imaging Review No results found.  MDM   1. Endometritis   2. Other acute sinusitis    Given 1 g of ceftriaxone here in, discharged with doxycycline and metronidazole, Norco for pain, Zofran for nausea, Flonase for congestion. Followup with GYN in 2 days.   Meds ordered this encounter  Medications  . cefTRIAXone (ROCEPHIN) injection 1 g    Sig:   . doxycycline (VIBRAMYCIN) 100 MG capsule    Sig: Take 1 capsule (100 mg total) by mouth 2 (two) times daily.    Dispense:  28 capsule    Refill:  0    Order Specific Question:  Supervising Provider    Answer:  Jake Michaelis, DAVID C [id number]  . metroNIDAZOLE (FLAGYL) 500 MG tablet    Sig: Take 1 tablet (500 mg total) by mouth 2 (two) times daily.    Dispense:  28 tablet    Refill:  0    Order Specific Question:  Supervising Provider    Answer:  Jake Michaelis, DAVID C [id number]  .  HYDROcodone-acetaminophen (NORCO) 5-325 MG per tablet    Sig: Take 1 tablet by mouth every 6 (six) hours as needed for moderate pain.    Dispense:  15 tablet    Refill:  0    Order Specific Question:  Supervising Provider    Answer:  Jake Michaelis, DAVID C [id number]  . fluticasone (FLONASE) 50 MCG/ACT nasal spray    Sig: Place 2 sprays into both nostrils 2 (two) times daily. Decrease to 2 sprays/nostril daily after 5 days    Dispense:  16 g    Refill:  2    Order Specific Question:  Supervising Provider    Answer:  Jake Michaelis, DAVID C [id number]  . ondansetron (ZOFRAN ODT) 8 MG disintegrating tablet    Sig: Take 1 tablet (8 mg total) by mouth every 8 (eight) hours as needed for nausea or vomiting.    Dispense:  12 tablet    Refill:  0    Order Specific Question:  Supervising Provider    Answer:  Jake Michaelis, DAVID C [[id number]]      Liam Graham, PA-C 05/10/14 1328

## 2014-05-11 LAB — URINE CULTURE

## 2014-05-11 NOTE — ED Provider Notes (Signed)
Medical screening examination/treatment/procedure(s) were performed by non-physician practitioner and as supervising physician I was immediately available for consultation/collaboration.  Philipp Deputy, M.D.  Harden Mo, MD 05/11/14 [phone]

## 2014-05-12 LAB — CULTURE, GROUP A STREP

## 2014-05-13 NOTE — ED Notes (Signed)
Gardnerella pos., rest of labs neg.  Pt. adequately treated with Flagyl. Roselyn Meier 05/13/2014

## 2014-05-15 ENCOUNTER — Ambulatory Visit: Payer: BC Managed Care – PPO | Admitting: Dietician

## 2014-06-26 ENCOUNTER — Ambulatory Visit: Payer: BC Managed Care – PPO | Admitting: Family Medicine

## 2014-08-14 ENCOUNTER — Other Ambulatory Visit (INDEPENDENT_AMBULATORY_CARE_PROVIDER_SITE_OTHER): Payer: Self-pay | Admitting: *Deleted

## 2014-08-14 DIAGNOSIS — Z9884 Bariatric surgery status: Secondary | ICD-10-CM

## 2014-08-19 ENCOUNTER — Encounter (HOSPITAL_COMMUNITY): Payer: Self-pay | Admitting: Emergency Medicine

## 2014-08-19 ENCOUNTER — Emergency Department (HOSPITAL_COMMUNITY)
Admission: EM | Admit: 2014-08-19 | Discharge: 2014-08-20 | Disposition: A | Discharge status: Home / Self Care | Payer: BC Managed Care – PPO | Attending: Emergency Medicine | Admitting: Emergency Medicine

## 2014-08-19 DIAGNOSIS — K219 Gastro-esophageal reflux disease without esophagitis: Secondary | ICD-10-CM | POA: Insufficient documentation

## 2014-08-19 DIAGNOSIS — Y9389 Activity, other specified: Secondary | ICD-10-CM | POA: Insufficient documentation

## 2014-08-19 DIAGNOSIS — Y92410 Unspecified street and highway as the place of occurrence of the external cause: Secondary | ICD-10-CM | POA: Insufficient documentation

## 2014-08-19 DIAGNOSIS — M542 Cervicalgia: Secondary | ICD-10-CM

## 2014-08-19 DIAGNOSIS — Z79899 Other long term (current) drug therapy: Secondary | ICD-10-CM | POA: Insufficient documentation

## 2014-08-19 DIAGNOSIS — I10 Essential (primary) hypertension: Secondary | ICD-10-CM | POA: Diagnosis not present

## 2014-08-19 DIAGNOSIS — M545 Low back pain, unspecified: Secondary | ICD-10-CM

## 2014-08-19 DIAGNOSIS — S3992XA Unspecified injury of lower back, initial encounter: Secondary | ICD-10-CM | POA: Diagnosis not present

## 2014-08-19 DIAGNOSIS — S199XXA Unspecified injury of neck, initial encounter: Secondary | ICD-10-CM | POA: Diagnosis not present

## 2014-08-19 DIAGNOSIS — Z8742 Personal history of other diseases of the female genital tract: Secondary | ICD-10-CM | POA: Insufficient documentation

## 2014-08-19 DIAGNOSIS — Z87891 Personal history of nicotine dependence: Secondary | ICD-10-CM | POA: Insufficient documentation

## 2014-08-19 MED ORDER — CYCLOBENZAPRINE HCL 10 MG PO TABS: 10.0000 mg | ORAL_TABLET | Freq: Two times a day (BID) | ORAL | Status: DC | PRN

## 2014-08-19 MED ORDER — NAPROXEN 500 MG PO TABS: 500.0000 mg | ORAL_TABLET | Freq: Two times a day (BID) | ORAL | Status: DC
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
## 2014-10-31 ENCOUNTER — Ambulatory Visit (INDEPENDENT_AMBULATORY_CARE_PROVIDER_SITE_OTHER): Payer: 59 | Admitting: Family Medicine

## 2014-10-31 ENCOUNTER — Encounter: Payer: Self-pay | Admitting: Family Medicine

## 2014-10-31 VITALS — BP 128/76 | HR 60 | Temp 98.1°F | Ht 69.0 in | Wt 292.3 lb

## 2014-10-31 DIAGNOSIS — G44219 Episodic tension-type headache, not intractable: Secondary | ICD-10-CM

## 2014-10-31 MED ORDER — CYCLOBENZAPRINE HCL 10 MG PO TABS: 10.0000 mg | ORAL_TABLET | Freq: Two times a day (BID) | ORAL | Status: DC | PRN

## 2014-10-31 MED ORDER — NAPROXEN 500 MG PO TABS: 500.0000 mg | ORAL_TABLET | Freq: Two times a day (BID) | ORAL | Status: DC | PRN

## 2014-10-31 MED ORDER — KETOROLAC TROMETHAMINE 60 MG/2ML IM SOLN
60.0000 mg | Freq: Once | INTRAMUSCULAR | Status: AC
  Administered 2014-10-31: 60 mg via INTRAMUSCULAR

## 2014-10-31 NOTE — Patient Instructions (Signed)
It was nice to see you today.  Use the medications as needed and follow up if your headaches persist or worsen.  Feel better and take care  Dr. Lacinda Axon  Tension Headache A tension headache is a feeling of pain, pressure, or aching often felt over the front and sides of the head. The pain can be dull or can feel tight (constricting). It is the most common type of headache. Tension headaches are not normally associated with nausea or vomiting and do not get worse with physical activity. Tension headaches can last 30 minutes to several days.  CAUSES  The exact cause is not known, but it may be caused by chemicals and hormones in the brain that lead to pain. Tension headaches often begin after stress, anxiety, or depression. Other triggers may include:  Alcohol.  Caffeine (too much or withdrawal).  Respiratory infections (colds, flu, sinus infections).  Dental problems or teeth clenching.  Fatigue.  Holding your head and neck in one position too long while using a computer. SYMPTOMS   Pressure around the head.   Dull, aching head pain.   Pain felt over the front and sides of the head.   Tenderness in the muscles of the head, neck, and shoulders. DIAGNOSIS  A tension headache is often diagnosed based on:   Symptoms.   Physical examination.   A CT scan or MRI of your head. These tests may be ordered if symptoms are severe or unusual. TREATMENT  Medicines may be given to help relieve symptoms.  HOME CARE INSTRUCTIONS   Only take over-the-counter or prescription medicines for pain or discomfort as directed by your caregiver.   Lie down in a dark, quiet room when you have a headache.   Keep a journal to find out what may be triggering your headaches. For example, write down:  What you eat and drink.  How much sleep you get.  Any change to your diet or medicines.  Try massage or other relaxation techniques.   Ice packs or heat applied to the head and neck can be  used. Use these 3 to 4 times per day for 15 to 20 minutes each time, or as needed.   Limit stress.   Sit up straight, and do not tense your muscles.   Quit smoking if you smoke.  Limit alcohol use.  Decrease the amount of caffeine you drink, or stop drinking caffeine.  Eat and exercise regularly.  Get 7 to 9 hours of sleep, or as recommended by your caregiver.  Avoid excessive use of pain medicine as recurrent headaches can occur.  SEEK MEDICAL CARE IF:   You have problems with the medicines you were prescribed.  Your medicines do not work.  You have a change from the usual headache.  You have nausea or vomiting. SEEK IMMEDIATE MEDICAL CARE IF:   Your headache becomes severe.  You have a fever.  You have a stiff neck.  You have loss of vision.  You have muscular weakness or loss of muscle control.  You lose your balance or have trouble walking.  You feel faint or pass out.  You have severe symptoms that are different from your first symptoms. MAKE SURE YOU:   Understand these instructions.  Will watch your condition.  Will get help right away if you are not doing well or get worse. Document Released: 10/10/2005 Document Revised: 01/02/2012 Document Reviewed: 09/30/2011 [facility] Patient Information 2015 [place], [place]. This information is not intended to replace advice given to  to you by your health care provider. Make sure you discuss any questions you have with your health care provider.  

## 2014-10-31 NOTE — Assessment & Plan Note (Signed)
Given history and physical exam this is likely tension headache. IM Toradol given in clinic today. Rx's for flexeril and naproxen given. Patient to follow up closely w/ PCP.

## 2014-10-31 NOTE — Progress Notes (Signed)
   Subjective:    Patient ID: Haley Wyatt, female    DOB: October 17, 1976, 39 y.o.   MRN: [id number]  HPI 39 year old female with a PMH of HTN presents with complaints of headache.  Headache  Onset: Began ~ 1 week ago.  Location: Frontal/bitemporal.   Quality: Dull.  Severity: 8/10.  Frequency: Occuring approximately daily.  Last hours.  Precipitating factors: Unknown.  Stress may be contributing.  Prior treatment: Tylenol w/ little relief.   Associated Symptoms Nausea/vomiting: + Nausea. No vomiting.  Photophobia/phonophobia: Mild photophobia.  Tearing of eyes: no  Sinus pain/pressure: no  Family hx migraine: no  Personal stressors: yes; stressors at work.   Red Flags Fever: no  Neck pain/stiffness: no  Vision/speech/swallow/hearing difficulty: no.  Focal weakness/numbness: no  Altered mental status: no  Trauma: no   Review of Systems Per HPI    Objective:   Physical Exam Filed Vitals:   10/31/14 1638  BP: 128/76  Pulse: 60  Temp: 98.1 F (36.7 C)   Exam: General: appears in pain, NAD.  HEENT: NCAT. PEERLA. EOMI. Cardiovascular: RRR. No murmurs, rubs, or gallops. Respiratory: CTAB. No rales, rhonchi, or wheeze. Neuro: CN 2-12 grossly intact.  Normal muscle strength in all extremities. Normal fundoscopic exam.     Assessment & Plan:  See Problem List

## 2014-12-29 ENCOUNTER — Emergency Department (HOSPITAL_COMMUNITY)
Admission: EM | Admit: 2014-12-29 | Discharge: 2014-12-29 | Disposition: A | Discharge status: Home / Self Care | Payer: 59 | Attending: Emergency Medicine | Admitting: Emergency Medicine

## 2014-12-29 ENCOUNTER — Encounter (HOSPITAL_COMMUNITY): Payer: Self-pay | Admitting: Emergency Medicine

## 2014-12-29 ENCOUNTER — Emergency Department (HOSPITAL_COMMUNITY): Payer: 59

## 2014-12-29 DIAGNOSIS — I10 Essential (primary) hypertension: Secondary | ICD-10-CM | POA: Diagnosis not present

## 2014-12-29 DIAGNOSIS — Y999 Unspecified external cause status: Secondary | ICD-10-CM | POA: Insufficient documentation

## 2014-12-29 DIAGNOSIS — Z8742 Personal history of other diseases of the female genital tract: Secondary | ICD-10-CM | POA: Insufficient documentation

## 2014-12-29 DIAGNOSIS — S3992XA Unspecified injury of lower back, initial encounter: Secondary | ICD-10-CM | POA: Insufficient documentation

## 2014-12-29 DIAGNOSIS — S0990XA Unspecified injury of head, initial encounter: Secondary | ICD-10-CM | POA: Diagnosis not present

## 2014-12-29 DIAGNOSIS — Y92410 Unspecified street and highway as the place of occurrence of the external cause: Secondary | ICD-10-CM | POA: Diagnosis not present

## 2014-12-29 DIAGNOSIS — S161XXA Strain of muscle, fascia and tendon at neck level, initial encounter: Secondary | ICD-10-CM | POA: Insufficient documentation

## 2014-12-29 DIAGNOSIS — Z79899 Other long term (current) drug therapy: Secondary | ICD-10-CM | POA: Insufficient documentation

## 2014-12-29 DIAGNOSIS — M545 Low back pain: Secondary | ICD-10-CM

## 2014-12-29 DIAGNOSIS — Y939 Activity, unspecified: Secondary | ICD-10-CM | POA: Diagnosis not present

## 2014-12-29 DIAGNOSIS — Z87891 Personal history of nicotine dependence: Secondary | ICD-10-CM | POA: Diagnosis not present

## 2014-12-29 DIAGNOSIS — S199XXA Unspecified injury of neck, initial encounter: Secondary | ICD-10-CM | POA: Diagnosis present

## 2014-12-29 DIAGNOSIS — Z8719 Personal history of other diseases of the digestive system: Secondary | ICD-10-CM | POA: Insufficient documentation

## 2014-12-29 MED ORDER — IBUPROFEN 800 MG PO TABS: 800.0000 mg | ORAL_TABLET | Freq: Three times a day (TID) | ORAL | Status: DC

## 2014-12-29 MED ORDER — HYDROCODONE-ACETAMINOPHEN 5-325 MG PO TABS: 2.0000 | ORAL_TABLET | Freq: Once | ORAL | Status: DC

## 2014-12-29 MED ORDER — HYDROCODONE-ACETAMINOPHEN 5-325 MG PO TABS
2.0000 | ORAL_TABLET | Freq: Once | ORAL | Status: AC
  Administered 2014-12-29: 2 via ORAL
  Filled 2014-12-29: qty 2

## 2014-12-29 MED ORDER — CYCLOBENZAPRINE HCL 10 MG PO TABS: 10.0000 mg | ORAL_TABLET | Freq: Two times a day (BID) | ORAL | Status: DC | PRN

## 2014-12-29 MED ORDER — HYDROCODONE-ACETAMINOPHEN 5-325 MG PO TABS: 2.0000 | ORAL_TABLET | ORAL | Status: DC | PRN
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
## 2014-12-29 NOTE — Discharge Instructions (Signed)
Cervical Sprain °A cervical sprain is an injury in the neck in which the strong, fibrous tissues (ligaments) that connect your neck bones stretch or tear. Cervical sprains can range from mild to severe. Severe cervical sprains can cause the neck vertebrae to be unstable. This can lead to damage of the spinal cord and can result in serious nervous system problems. The amount of time it takes for a cervical sprain to get better depends on the cause and extent of the injury. Most cervical sprains heal in 1 to 3 weeks. °CAUSES  °Severe cervical sprains may be caused by:  °· Contact sport injuries (such as from football, rugby, wrestling, hockey, auto racing, gymnastics, diving, martial arts, or boxing).   °· Motor vehicle collisions.   °· Whiplash injuries. This is an injury from a sudden forward and backward whipping movement of the head and neck.  °· Falls.   °Mild cervical sprains may be caused by:  °· Being in an awkward position, such as while cradling a telephone between your ear and shoulder.   °· Sitting in a chair that does not offer proper support.   °· Working at a poorly designed computer station.   °· Looking up or down for long periods of time.   °SYMPTOMS  °· Pain, soreness, stiffness, or a burning sensation in the front, back, or sides of the neck. This discomfort may develop immediately after the injury or slowly, 24 hours or more after the injury.   °· Pain or tenderness directly in the middle of the back of the neck.   °· Shoulder or upper back pain.   °· Limited ability to move the neck.   °· Headache.   °· Dizziness.   °· Weakness, numbness, or tingling in the hands or arms.   °· Muscle spasms.   °· Difficulty swallowing or chewing.   °· Tenderness and swelling of the neck.   °DIAGNOSIS  °Most of the time your health care provider can diagnose a cervical sprain by taking your history and doing a physical exam. Your health care provider will ask about previous neck injuries and any known neck  problems, such as arthritis in the neck. X-rays may be taken to find out if there are any other problems, such as with the bones of the neck. Other tests, such as a CT scan or MRI, may also be needed.  °TREATMENT  °Treatment depends on the severity of the cervical sprain. Mild sprains can be treated with rest, keeping the neck in place (immobilization), and pain medicines. Severe cervical sprains are immediately immobilized. Further treatment is done to help with pain, muscle spasms, and other symptoms and may include: °· Medicines, such as pain relievers, numbing medicines, or muscle relaxants.   °· Physical therapy. This may involve stretching exercises, strengthening exercises, and posture training. Exercises and improved posture can help stabilize the neck, strengthen muscles, and help stop symptoms from returning.   °HOME CARE INSTRUCTIONS  °· Put ice on the injured area.   °¨ Put ice in a plastic bag.   °¨ Place a towel between your skin and the bag.   °¨ Leave the ice on for 15-20 minutes, 3-4 times a day.   °· If your injury was severe, you may have been given a cervical collar to wear. A cervical collar is a two-piece collar designed to keep your neck from moving while it heals. °¨ Do not remove the collar unless instructed by your health care provider. °¨ If you have long hair, keep it outside of the collar. °¨ Ask your health care provider before making any adjustments to your collar. Minor   adjustments may be required over time to improve comfort and reduce pressure on your chin or on the back of your head. °¨ If you are allowed to remove the collar for cleaning or bathing, follow your health care provider's instructions on how to do so safely. °¨ Keep your collar clean by wiping it with mild soap and water and drying it completely. If the collar you have been given includes removable pads, remove them every 1-2 days and hand wash them with soap and water. Allow them to air dry. They should be completely  dry before you wear them in the collar. °¨ If you are allowed to remove the collar for cleaning and bathing, wash and dry the skin of your neck. Check your skin for irritation or sores. If you see any, tell your health care provider. °¨ Do not drive while wearing the collar.   °· Only take over-the-counter or prescription medicines for pain, discomfort, or fever as directed by your health care provider.   °· Keep all follow-up appointments as directed by your health care provider.   °· Keep all physical therapy appointments as directed by your health care provider.   °· Make any needed adjustments to your workstation to promote good posture.   °· Avoid positions and activities that make your symptoms worse.   °· Warm up and stretch before being active to help prevent problems.   °SEEK MEDICAL CARE IF:  °· Your pain is not controlled with medicine.   °· You are unable to decrease your pain medicine over time as planned.   °· Your activity level is not improving as expected.   °SEEK IMMEDIATE MEDICAL CARE IF:  °· You develop any bleeding. °· You develop stomach upset. °· You have signs of an allergic reaction to your medicine.   °· Your symptoms get worse.   °· You develop new, unexplained symptoms.   °· You have numbness, tingling, weakness, or paralysis in any part of your body.   °MAKE SURE YOU:  °· Understand these instructions. °· Will watch your condition. °· Will get help right away if you are not doing well or get worse. °Document Released: 08/07/2007 Document Revised: 10/15/2013 Document Reviewed: 04/17/2013 °[employer]® Patient Information ©2015 [employer]. This information is not intended to replace advice given to you by your health care provider. Make sure you discuss any questions you have with your health care provider. ° °Motor Vehicle Collision °It is common to have multiple bruises and sore muscles after a motor vehicle collision (MVC). These tend to feel worse for the first 24 hours. You may have  the most stiffness and soreness over the first several hours. You may also feel worse when you wake up the first morning after your collision. After this point, you will usually begin to improve with each day. The speed of improvement often depends on the severity of the collision, the number of injuries, and the location and nature of these injuries. °HOME CARE INSTRUCTIONS °· Put ice on the injured area. °¨ Put ice in a plastic bag. °¨ Place a towel between your skin and the bag. °¨ Leave the ice on for 15-20 minutes, 3-4 times a day, or as directed by your health care provider. °· Drink enough fluids to keep your urine clear or pale yellow. Do not drink alcohol. °· Take a warm shower or bath once or twice a day. This will increase blood flow to sore muscles. °· You may return to activities as directed by your caregiver. Be careful when lifting, as this may aggravate neck or back   pain. °· Only take over-the-counter or prescription medicines for pain, discomfort, or fever as directed by your caregiver. Do not use aspirin. This may increase bruising and bleeding. °SEEK IMMEDIATE MEDICAL CARE IF: °· You have numbness, tingling, or weakness in the arms or legs. °· You develop severe headaches not relieved with medicine. °· You have severe neck pain, especially tenderness in the middle of the back of your neck. °· You have changes in bowel or bladder control. °· There is increasing pain in any area of the body. °· You have shortness of breath, light-headedness, dizziness, or fainting. °· You have chest pain. °· You feel sick to your stomach (nauseous), throw up (vomit), or sweat. °· You have increasing abdominal discomfort. °· There is blood in your urine, stool, or vomit. °· You have pain in your shoulder (shoulder strap areas). °· You feel your symptoms are getting worse. °MAKE SURE YOU: °· Understand these instructions. °· Will watch your condition. °· Will get help right away if you are not doing well or get  worse. °Document Released: 10/10/2005 Document Revised: 02/24/2014 Document Reviewed: 03/09/2011 °Exit[employer]® Patient Information ©2015 [employer]. This information is not intended to replace advice given to you by your health care provider. Make sure you discuss any questions you have with your health care provider. ° °

## 2015-01-12 ENCOUNTER — Inpatient Hospital Stay (HOSPITAL_COMMUNITY): Admission: RE | Admit: 2015-01-12 | Payer: 59 | Source: Ambulatory Visit

## 2015-01-13 ENCOUNTER — Other Ambulatory Visit: Payer: Self-pay | Admitting: Obstetrics and Gynecology

## 2015-01-15 ENCOUNTER — Other Ambulatory Visit (HOSPITAL_COMMUNITY): Payer: Self-pay | Admitting: Obstetrics and Gynecology

## 2015-01-15 ENCOUNTER — Other Ambulatory Visit: Payer: Self-pay

## 2015-01-15 NOTE — H&P (Signed)
Haley Wyatt is a 39 y.o. female  4-0-4-4 presents for hysterectomy because of menorrhagia and uterine fibroids. For the past 6 years the patient reports that her menstrual periods were heavy, lasting 5-9 days with a "constant" need to change her protection.  This flow was accompanied by severe cramping, rated at 8/10 on a 10 point pain scale and not relieved with Ibuprofen 800 mg.  At one point the patient was placed on a "type" on oral contraception that worked well for flow and cramping control but she was taken off of it for reasons she doesn't recall.  Last year, after undergoing hydro-thermal ablation the patients flow became much more manageable (only changing a tampon with a pad 5 times a day) but her cramping, that begins an average of 2 days before her menses, remained just as intense.  She denies any inter-menstrual bleeding, urinary frequency or dysuria but admits to urinary urgency, lower back pain with her cramping, positional  dyspareunia and overall malaise with each menstrual period.  A pelvic ultrasound in March 2016 showed a uterus- 9.87 x 11.6 x 10.3  with a right fundal fibroid measuring 6.5 x 5.9 x 5.8 cm and #2 intramural fibroids measuring 2.0 cm and 2.5 cm. Her left ovary measured 3.64 x 2/21 x 1.94 cm and right ovary- 3.95 x 3.14 x 1.70 cm.  A review of both medical and surgical management options were given to the patient however, she has decided to proceed with a supra-cervical hysterectomy for management of her symptoms.  Past Medical History: Chronic Back Pain, Borderline Diabetes Mellitus,   OB History: G: 8; P 4-0-4-4; SVB 1991, 1993, 1997 and 1999  GYN History: menarche: 39 YO    LMP: 12/16/2014    Contracepton bilateral tubal ligation  The patient reports a past history of: chlamydia, gonorrhea, herpes and HPV.  Has a remote  history of abnormal PAP smear in 1999-CIN-1 but normal since.  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  ROS: Admits to tension headaches, malaise with menses and urinary urgency but  denies vision changes, nasal congestion, dysphagia, tinnitus, dizziness, hoarseness, cough,  chest pain, shortness of breath, nausea, vomiting, diarrhea,constipation,  urinary frequency, dysuria, hematuria, vaginitis symptoms, pelvic pain, swelling of joints,easy bruising,  myalgias, arthralgias, skin rashes, unexplained weight loss and except as is mentioned in the history of present illness, patient's review of systems is otherwise negative.   Physical Exam  Bp: 116/80    P: 80      R: 16    Temperature:98.4 degrees F orally  Weight: 298 lbs.      Height:  5'8"       BMI: 45.3  Neck: supple without masses or thyromegaly Lungs: clear to auscultation Heart: regular rate and  rhythm Abdomen: soft, non-tender and no organomegaly Pelvic:EGBUS- wnl; vagina-normal rugae; uterus-enlarged, irregular but difficult to evaluate due to body habitus, cervix without lesions or motion tenderness; adnexae-no tenderness or masses Extremities:  no clubbing, cyanosis or edema  Endometrial Biopsy-proliferative endometrium, no hyperplasia  or malignancy.   Assesment: Menorrhagia           Uterine Fibroids           Dysmenorrhea   Disposition: Reviewed the risks of surgery to include, but not limited to: reaction to anesthesia, damage to adjacent organs, infection, excessive bleeding, possible growth of fibroid on cervical stump and possible irregular vaginal bleeding from cervix.. The patient verbalized understanding of these risks and has consented to proceed with an Abdominal Supra-Cervical Hysterectomy with Possible Bilateral Salpingectomy at [facility] on January 28, 2015 at 7:30 a.m.   CSN# [id number]   Fabricio Endsley J. Florene Glen, PA-C  for Dr.  Seymour Bars. Haygood

## 2015-01-16 ENCOUNTER — Encounter (HOSPITAL_COMMUNITY)
Admission: RE | Admit: 2015-01-16 | Discharge: 2015-01-16 | Disposition: A | Discharge status: Home / Self Care | Payer: 59 | Source: Ambulatory Visit | Attending: Obstetrics and Gynecology | Admitting: Obstetrics and Gynecology

## 2015-01-16 ENCOUNTER — Encounter (HOSPITAL_COMMUNITY): Payer: Self-pay

## 2015-01-16 DIAGNOSIS — Z01812 Encounter for preprocedural laboratory examination: Secondary | ICD-10-CM | POA: Insufficient documentation

## 2015-01-16 LAB — CBC
HEMATOCRIT: 39 % (ref 36.0–46.0)
Hemoglobin: 12.7 g/dL (ref 12.0–15.0)
MCH: 27.2 pg (ref 26.0–34.0)
MCHC: 32.6 g/dL (ref 30.0–36.0)
MCV: 83.5 fL (ref 78.0–100.0)
Platelets: 209 10*3/uL (ref 150–400)
RBC: 4.67 MIL/uL (ref 3.87–5.11)
RDW: 14.7 % (ref 11.5–15.5)
WBC: 6.8 10*3/uL (ref 4.0–10.5)

## 2015-01-16 NOTE — Patient Instructions (Addendum)
   Your procedure is scheduled on: April 6 at [facility] through the [place] [facility] at:6am  Pick up the phone at the desk and dial [phone] and inform us of your arrival.  Please call this number if you have any problems the morning of surgery: [phone]  Remember: Do not eat food OR DRINK LIQUIDS  after midnight: April 5    Do not wear jewelry, make-up, or FINGER nail polish No metal in your hair or on your body. Do not wear lotions, powders, perfumes.  You may wear deodorant.  Do not bring valuables to the hospital. Contacts, dentures or bridgework may not be worn into surgery.  Leave suitcase in the car. After Surgery it may be brought to your room. For patients being admitted to the hospital, checkout time is 11:00am the day of discharge.    Marland Kitchen

## 2015-01-28 ENCOUNTER — Inpatient Hospital Stay (HOSPITAL_COMMUNITY): Payer: 59 | Admitting: Anesthesiology

## 2015-01-28 ENCOUNTER — Encounter (HOSPITAL_COMMUNITY): Payer: Self-pay | Admitting: Certified Registered Nurse Anesthetist

## 2015-01-28 ENCOUNTER — Inpatient Hospital Stay (HOSPITAL_COMMUNITY)
Admission: RE | Admit: 2015-01-28 | Discharge: 2015-01-30 | DRG: 742 | Disposition: A | Discharge status: Home / Self Care | Payer: 59 | Source: Ambulatory Visit | Attending: Obstetrics and Gynecology | Admitting: Obstetrics and Gynecology

## 2015-01-28 ENCOUNTER — Encounter (HOSPITAL_COMMUNITY)
Admission: RE | Disposition: A | Discharge status: Home / Self Care | Payer: Self-pay | Source: Ambulatory Visit | Attending: Obstetrics and Gynecology

## 2015-01-28 DIAGNOSIS — Z6841 Body Mass Index (BMI) 40.0 and over, adult: Secondary | ICD-10-CM

## 2015-01-28 DIAGNOSIS — N920 Excessive and frequent menstruation with regular cycle: Secondary | ICD-10-CM | POA: Diagnosis present

## 2015-01-28 DIAGNOSIS — N946 Dysmenorrhea, unspecified: Secondary | ICD-10-CM | POA: Diagnosis present

## 2015-01-28 DIAGNOSIS — D259 Leiomyoma of uterus, unspecified: Principal | ICD-10-CM | POA: Diagnosis present

## 2015-01-28 DIAGNOSIS — E039 Hypothyroidism, unspecified: Secondary | ICD-10-CM | POA: Diagnosis present

## 2015-01-28 DIAGNOSIS — I10 Essential (primary) hypertension: Secondary | ICD-10-CM | POA: Diagnosis present

## 2015-01-28 DIAGNOSIS — Z87891 Personal history of nicotine dependence: Secondary | ICD-10-CM | POA: Diagnosis not present

## 2015-01-28 DIAGNOSIS — D251 Intramural leiomyoma of uterus: Secondary | ICD-10-CM | POA: Diagnosis present

## 2015-01-28 DIAGNOSIS — D219 Benign neoplasm of connective and other soft tissue, unspecified: Secondary | ICD-10-CM | POA: Diagnosis present

## 2015-01-28 HISTORY — PX: BILATERAL SALPINGECTOMY: SHX5743

## 2015-01-28 HISTORY — DX: Dysmenorrhea, unspecified: N94.6

## 2015-01-28 HISTORY — PX: SUPRACERVICAL ABDOMINAL HYSTERECTOMY: SHX5393

## 2015-01-28 LAB — PREGNANCY, URINE: Preg Test, Ur: NEGATIVE

## 2015-01-28 LAB — TYPE AND SCREEN
ABO/RH(D): A POS
Antibody Screen: NEGATIVE

## 2015-01-28 LAB — ABO/RH: ABO/RH(D): A POS

## 2015-01-28 SURGERY — HYSTERECTOMY, SUPRACERVICAL, ABDOMINAL
Anesthesia: General | Laterality: Bilateral

## 2015-01-28 MED ORDER — SODIUM CHLORIDE 0.9 % IJ SOLN: 9.0000 mL | INTRAMUSCULAR | Status: DC | PRN

## 2015-01-28 MED ORDER — MIDAZOLAM HCL 2 MG/2ML IJ SOLN
INTRAMUSCULAR | Status: AC
  Filled 2015-01-28: qty 2

## 2015-01-28 MED ORDER — NEOSTIGMINE METHYLSULFATE 10 MG/10ML IV SOLN
INTRAVENOUS | Status: DC | PRN
  Administered 2015-01-28: 5 mg via INTRAVENOUS

## 2015-01-28 MED ORDER — HEPARIN SODIUM (PORCINE) 5000 UNIT/ML IJ SOLN
INTRAMUSCULAR | Status: AC
  Filled 2015-01-28: qty 1

## 2015-01-28 MED ORDER — MEPERIDINE HCL 25 MG/ML IJ SOLN: 6.2500 mg | INTRAMUSCULAR | Status: DC | PRN

## 2015-01-28 MED ORDER — 0.9 % SODIUM CHLORIDE (POUR BTL) OPTIME
TOPICAL | Status: DC | PRN
  Administered 2015-01-28: 2000 mL

## 2015-01-28 MED ORDER — ONDANSETRON HCL 4 MG/2ML IJ SOLN
4.0000 mg | Freq: Four times a day (QID) | INTRAMUSCULAR | Status: DC | PRN
  Filled 2015-01-28: qty 2

## 2015-01-28 MED ORDER — IBUPROFEN 600 MG PO TABS: 600.0000 mg | ORAL_TABLET | Freq: Four times a day (QID) | ORAL | Status: DC

## 2015-01-28 MED ORDER — LIDOCAINE HCL (CARDIAC) 20 MG/ML IV SOLN
INTRAVENOUS | Status: DC | PRN
  Administered 2015-01-28: 80 mg via INTRAVENOUS

## 2015-01-28 MED ORDER — GLYCOPYRROLATE 0.2 MG/ML IJ SOLN
INTRAMUSCULAR | Status: AC
  Filled 2015-01-28: qty 4

## 2015-01-28 MED ORDER — ONDANSETRON HCL 4 MG/2ML IJ SOLN
INTRAMUSCULAR | Status: AC
  Filled 2015-01-28: qty 2

## 2015-01-28 MED ORDER — GLYCOPYRROLATE 0.2 MG/ML IJ SOLN
INTRAMUSCULAR | Status: DC | PRN
  Administered 2015-01-28: .8 mg via INTRAVENOUS
  Administered 2015-01-28 (×2): 0.1 mg via INTRAVENOUS

## 2015-01-28 MED ORDER — DEXAMETHASONE SODIUM PHOSPHATE 4 MG/ML IJ SOLN
INTRAMUSCULAR | Status: AC
  Filled 2015-01-28: qty 1

## 2015-01-28 MED ORDER — HYDROCODONE-ACETAMINOPHEN 5-325 MG PO TABS: 1.0000 | ORAL_TABLET | ORAL | Status: DC | PRN

## 2015-01-28 MED ORDER — SCOPOLAMINE 1 MG/3DAYS TD PT72
1.0000 | MEDICATED_PATCH | Freq: Once | TRANSDERMAL | Status: DC
  Administered 2015-01-28: 1.5 mg via TRANSDERMAL

## 2015-01-28 MED ORDER — HYDROMORPHONE HCL 1 MG/ML IJ SOLN
INTRAMUSCULAR | Status: AC
  Filled 2015-01-28: qty 1

## 2015-01-28 MED ORDER — PROPOFOL 10 MG/ML IV BOLUS
INTRAVENOUS | Status: AC
  Filled 2015-01-28: qty 20

## 2015-01-28 MED ORDER — FENTANYL CITRATE 0.05 MG/ML IJ SOLN
INTRAMUSCULAR | Status: DC | PRN
  Administered 2015-01-28: 50 ug via INTRAVENOUS
  Administered 2015-01-28: 100 ug via INTRAVENOUS
  Administered 2015-01-28 (×3): 50 ug via INTRAVENOUS
  Administered 2015-01-28 (×2): 100 ug via INTRAVENOUS

## 2015-01-28 MED ORDER — KETOROLAC TROMETHAMINE 30 MG/ML IJ SOLN
INTRAMUSCULAR | Status: DC | PRN
  Administered 2015-01-28: 30 mg via INTRAVENOUS

## 2015-01-28 MED ORDER — NEOSTIGMINE METHYLSULFATE 10 MG/10ML IV SOLN
INTRAVENOUS | Status: AC
  Filled 2015-01-28: qty 1

## 2015-01-28 MED ORDER — MIDAZOLAM HCL 2 MG/2ML IJ SOLN
INTRAMUSCULAR | Status: DC | PRN
  Administered 2015-01-28: 2 mg via INTRAVENOUS

## 2015-01-28 MED ORDER — KETOROLAC TROMETHAMINE 30 MG/ML IJ SOLN
30.0000 mg | Freq: Four times a day (QID) | INTRAMUSCULAR | Status: AC
  Administered 2015-01-28 – 2015-01-29 (×3): 30 mg via INTRAVENOUS
  Filled 2015-01-28 (×2): qty 1

## 2015-01-28 MED ORDER — LIDOCAINE HCL (CARDIAC) 20 MG/ML IV SOLN
INTRAVENOUS | Status: AC
  Filled 2015-01-28: qty 5

## 2015-01-28 MED ORDER — LACTATED RINGERS IV SOLN
INTRAVENOUS | Status: DC
  Administered 2015-01-28 (×3): via INTRAVENOUS

## 2015-01-28 MED ORDER — LACTATED RINGERS IV SOLN
INTRAVENOUS | Status: DC
  Administered 2015-01-28 – 2015-01-29 (×3): via INTRAVENOUS

## 2015-01-28 MED ORDER — MENTHOL 3 MG MT LOZG: 1.0000 | LOZENGE | OROMUCOSAL | Status: DC | PRN

## 2015-01-28 MED ORDER — ONDANSETRON HCL 4 MG/2ML IJ SOLN
4.0000 mg | Freq: Four times a day (QID) | INTRAMUSCULAR | Status: DC | PRN
  Administered 2015-01-28: 4 mg via INTRAVENOUS

## 2015-01-28 MED ORDER — BACITRACIN-NEOMYCIN-POLYMYXIN 400-5-5000 EX OINT
TOPICAL_OINTMENT | CUTANEOUS | Status: AC
  Filled 2015-01-28: qty 1

## 2015-01-28 MED ORDER — ALUM & MAG HYDROXIDE-SIMETH 200-200-20 MG/5ML PO SUSP: 30.0000 mL | ORAL | Status: DC | PRN

## 2015-01-28 MED ORDER — DIPHENHYDRAMINE HCL 50 MG/ML IJ SOLN: 12.5000 mg | Freq: Four times a day (QID) | INTRAMUSCULAR | Status: DC | PRN

## 2015-01-28 MED ORDER — FENTANYL CITRATE 0.05 MG/ML IJ SOLN
INTRAMUSCULAR | Status: AC
  Filled 2015-01-28: qty 5

## 2015-01-28 MED ORDER — SIMETHICONE 80 MG PO CHEW
80.0000 mg | CHEWABLE_TABLET | Freq: Four times a day (QID) | ORAL | Status: DC | PRN
  Administered 2015-01-29: 80 mg via ORAL
  Filled 2015-01-28: qty 1

## 2015-01-28 MED ORDER — HYDROMORPHONE 0.3 MG/ML IV SOLN
INTRAVENOUS | Status: DC
  Administered 2015-01-28: 12:00:00 via INTRAVENOUS
  Administered 2015-01-28: 3.9 mg via INTRAVENOUS
  Administered 2015-01-28: 1.5 mg via INTRAVENOUS
  Administered 2015-01-29: 02:00:00 via INTRAVENOUS
  Administered 2015-01-29: 1.8 mg via INTRAVENOUS
  Filled 2015-01-28 (×2): qty 25

## 2015-01-28 MED ORDER — DEXAMETHASONE SODIUM PHOSPHATE 10 MG/ML IJ SOLN
INTRAMUSCULAR | Status: DC | PRN
  Administered 2015-01-28: 4 mg via INTRAVENOUS

## 2015-01-28 MED ORDER — BUPIVACAINE HCL (PF) 0.25 % IJ SOLN
INTRAMUSCULAR | Status: AC
  Filled 2015-01-28: qty 10

## 2015-01-28 MED ORDER — NALOXONE HCL 0.4 MG/ML IJ SOLN: 0.4000 mg | INTRAMUSCULAR | Status: DC | PRN

## 2015-01-28 MED ORDER — KETOROLAC TROMETHAMINE 30 MG/ML IJ SOLN
30.0000 mg | Freq: Four times a day (QID) | INTRAMUSCULAR | Status: AC
  Filled 2015-01-28: qty 1

## 2015-01-28 MED ORDER — GLYCOPYRROLATE 0.2 MG/ML IJ SOLN
INTRAMUSCULAR | Status: AC
  Filled 2015-01-28: qty 1

## 2015-01-28 MED ORDER — SCOPOLAMINE 1 MG/3DAYS TD PT72
MEDICATED_PATCH | TRANSDERMAL | Status: AC
  Administered 2015-01-28: 1.5 mg via TRANSDERMAL
  Filled 2015-01-28: qty 1

## 2015-01-28 MED ORDER — PHENYLEPHRINE HCL 10 MG/ML IJ SOLN
INTRAMUSCULAR | Status: DC | PRN
  Administered 2015-01-28 (×2): 40 ug via INTRAVENOUS

## 2015-01-28 MED ORDER — BUPIVACAINE HCL (PF) 0.25 % IJ SOLN
INTRAMUSCULAR | Status: AC
  Filled 2015-01-28: qty 30

## 2015-01-28 MED ORDER — ACETAMINOPHEN 10 MG/ML IV SOLN
1000.0000 mg | Freq: Once | INTRAVENOUS | Status: AC
  Administered 2015-01-28: 1000 mg via INTRAVENOUS
  Filled 2015-01-28: qty 100

## 2015-01-28 MED ORDER — ONDANSETRON HCL 4 MG/2ML IJ SOLN
INTRAMUSCULAR | Status: DC | PRN
  Administered 2015-01-28: 4 mg via INTRAVENOUS

## 2015-01-28 MED ORDER — DEXTROSE 5 % IV SOLN
2.0000 g | Freq: Once | INTRAVENOUS | Status: AC
  Administered 2015-01-28: 2 g via INTRAVENOUS
  Filled 2015-01-28: qty 2

## 2015-01-28 MED ORDER — PROMETHAZINE HCL 25 MG/ML IJ SOLN: 6.2500 mg | INTRAMUSCULAR | Status: DC | PRN

## 2015-01-28 MED ORDER — DIPHENHYDRAMINE HCL 12.5 MG/5ML PO ELIX: 12.5000 mg | ORAL_SOLUTION | Freq: Four times a day (QID) | ORAL | Status: DC | PRN

## 2015-01-28 MED ORDER — BUPIVACAINE HCL (PF) 0.25 % IJ SOLN
INTRAMUSCULAR | Status: DC | PRN
  Administered 2015-01-28: 30 mL

## 2015-01-28 MED ORDER — HYDROMORPHONE HCL 1 MG/ML IJ SOLN
0.2500 mg | INTRAMUSCULAR | Status: DC | PRN
  Administered 2015-01-28 (×4): 0.5 mg via INTRAVENOUS

## 2015-01-28 MED ORDER — ONDANSETRON HCL 4 MG PO TABS: 4.0000 mg | ORAL_TABLET | Freq: Four times a day (QID) | ORAL | Status: DC | PRN

## 2015-01-28 MED ORDER — ROCURONIUM BROMIDE 100 MG/10ML IV SOLN
INTRAVENOUS | Status: AC
  Filled 2015-01-28: qty 1

## 2015-01-28 MED ORDER — ROCURONIUM BROMIDE 100 MG/10ML IV SOLN
INTRAVENOUS | Status: DC | PRN
  Administered 2015-01-28: 20 mg via INTRAVENOUS
  Administered 2015-01-28: 10 mg via INTRAVENOUS
  Administered 2015-01-28: 40 mg via INTRAVENOUS
  Administered 2015-01-28: 20 mg via INTRAVENOUS

## 2015-01-28 MED ORDER — MAGNESIUM HYDROXIDE 400 MG/5ML PO SUSP: 30.0000 mL | Freq: Every day | ORAL | Status: DC | PRN

## 2015-01-28 MED ORDER — KETOROLAC TROMETHAMINE 30 MG/ML IJ SOLN
INTRAMUSCULAR | Status: AC
  Filled 2015-01-28: qty 1

## 2015-01-28 MED ORDER — PROPOFOL 10 MG/ML IV BOLUS
INTRAVENOUS | Status: DC | PRN
  Administered 2015-01-28: 200 mg via INTRAVENOUS

## 2015-01-28 SURGICAL SUPPLY — 50 items
CANISTER SUCT 3000ML (MISCELLANEOUS) ×3 IMPLANT
CATH ROBINSON RED A/P 16FR (CATHETERS) IMPLANT
CLOTH BEACON ORANGE TIMEOUT ST (SAFETY) ×3 IMPLANT
CONT PATH 16OZ SNAP LID 3702 (MISCELLANEOUS) ×3 IMPLANT
CONT SPECI 4OZ STER CLIK (MISCELLANEOUS) IMPLANT
DECANTER SPIKE VIAL GLASS SM (MISCELLANEOUS) IMPLANT
DRAIN JACKSON PRT FLT 7MM (DRAIN) IMPLANT
DRAPE WARM FLUID 44X44 (DRAPE) ×2 IMPLANT
DRSG OPSITE POSTOP 4X10 (GAUZE/BANDAGES/DRESSINGS) ×3 IMPLANT
DURAPREP 26ML APPLICATOR (WOUND CARE) ×3 IMPLANT
ELECT CAUTERY BLADE 6.4 (BLADE) IMPLANT
ELECT NDL TIP 2.8 STRL (NEEDLE) IMPLANT
ELECT NEEDLE TIP 2.8 STRL (NEEDLE) IMPLANT
EVACUATOR SILICONE 100CC (DRAIN) IMPLANT
GAUZE SPONGE 4X4 16PLY XRAY LF (GAUZE/BANDAGES/DRESSINGS) ×3 IMPLANT
GLOVE SURG SS PI 6.5 STRL IVOR (GLOVE) ×6 IMPLANT
GOWN STRL REUS W/TWL LRG LVL3 (GOWN DISPOSABLE) ×9 IMPLANT
LIQUID BAND (GAUZE/BANDAGES/DRESSINGS) IMPLANT
NDL HYPO 25X1 1.5 SAFETY (NEEDLE) IMPLANT
NDL SPNL 22GX3.5 QUINCKE BK (NEEDLE) ×1 IMPLANT
NEEDLE HYPO 25X1 1.5 SAFETY (NEEDLE) ×3 IMPLANT
NEEDLE SPNL 22GX3.5 QUINCKE BK (NEEDLE) ×3 IMPLANT
NS IRRIG 1000ML POUR BTL (IV SOLUTION) ×3 IMPLANT
PACK ABDOMINAL GYN (CUSTOM PROCEDURE TRAY) ×3 IMPLANT
PAD ABD 7.5X8 STRL (GAUZE/BANDAGES/DRESSINGS) ×2 IMPLANT
PAD OB MATERNITY 4.3X12.25 (PERSONAL CARE ITEMS) ×3 IMPLANT
PROTECTOR NERVE ULNAR (MISCELLANEOUS) ×3 IMPLANT
SPONGE GAUZE 4X4 12PLY STER LF (GAUZE/BANDAGES/DRESSINGS) ×2 IMPLANT
SPONGE LAP 18X18 X RAY DECT (DISPOSABLE) ×6 IMPLANT
STAPLER VISISTAT 35W (STAPLE) IMPLANT
SUT MNCRL AB 3-0 PS2 27 (SUTURE) ×2 IMPLANT
SUT PDS AB 1 CT  36 (SUTURE)
SUT PDS AB 1 CT 36 (SUTURE) IMPLANT
SUT PLAIN 2 0 XLH (SUTURE) IMPLANT
SUT SILK 0 FSL (SUTURE) IMPLANT
SUT VIC AB 0 CT1 18XCR BRD8 (SUTURE) ×3 IMPLANT
SUT VIC AB 0 CT1 27 (SUTURE) ×6
SUT VIC AB 0 CT1 27XBRD ANBCTR (SUTURE) IMPLANT
SUT VIC AB 0 CT1 8-18 (SUTURE) ×9
SUT VIC AB 2-0 CT1 (SUTURE) ×3 IMPLANT
SUT VIC AB 3-0 SH 27 (SUTURE)
SUT VIC AB 3-0 SH 27X BRD (SUTURE) IMPLANT
SUT VICRYL 0 TIES 12 18 (SUTURE) ×3 IMPLANT
SYR 50ML LL SCALE MARK (SYRINGE) IMPLANT
SYR CONTROL 10ML LL (SYRINGE) ×2 IMPLANT
SYR TB 1ML 25GX5/8 (SYRINGE) IMPLANT
TAPE CLOTH SURG 4X10 WHT LF (GAUZE/BANDAGES/DRESSINGS) ×2 IMPLANT
TOWEL OR 17X24 6PK STRL BLUE (TOWEL DISPOSABLE) ×6 IMPLANT
TRAY FOLEY CATH SILVER 14FR (SET/KITS/TRAYS/PACK) ×3 IMPLANT
WATER STERILE IRR 1000ML POUR (IV SOLUTION) ×3 IMPLANT

## 2015-01-28 NOTE — Anesthesia Preprocedure Evaluation (Addendum)
Anesthesia Evaluation  Patient identified by MRN, date of birth, ID band Patient awake    Reviewed: Allergy & Precautions, H&P , NPO status , Patient's Chart, lab work & pertinent test results  Airway Mallampati: I  TM Distance: >3 FB Neck ROM: full    Dental no notable dental hx. (+) Teeth Intact   Pulmonary former smoker,    Pulmonary exam normal       Cardiovascular hypertension,     Neuro/Psych    GI/Hepatic Neg liver ROS,   Endo/Other  Hypothyroidism Morbid obesity  Renal/GU negative Renal ROS     Musculoskeletal   Abdominal (+) + obese,   Peds  Hematology negative hematology ROS (+)   Anesthesia Other Findings   Reproductive/Obstetrics negative OB ROS                            Anesthesia Physical Anesthesia Plan  ASA: III  Anesthesia Plan: General   Post-op Pain Management:    Induction: Intravenous  Airway Management Planned: Oral ETT  Additional Equipment:   Intra-op Plan:   Post-operative Plan: Extubation in OR  Informed Consent: I have reviewed the patients History and Physical, chart, labs and discussed the procedure including the risks, benefits and alternatives for the proposed anesthesia with the patient or authorized representative who has indicated his/her understanding and acceptance.   Dental Advisory Given  Plan Discussed with: CRNA, Anesthesiologist and Surgeon  Anesthesia Plan Comments:         Anesthesia Quick Evaluation

## 2015-01-28 NOTE — Anesthesia Procedure Notes (Addendum)
Procedure Name: Intubation Date/Time: 01/28/2015 7:41 AM Performed by: Raenette Rover Pre-anesthesia Checklist: Patient identified, Emergency Drugs available, Suction available and Patient being monitored Patient Re-evaluated:Patient Re-evaluated prior to inductionOxygen Delivery Method: Circle system utilized Preoxygenation: Pre-oxygenation with 100% oxygen Intubation Type: IV induction Ventilation: Mask ventilation without difficulty Laryngoscope Size: Miller and 2 Grade View: Grade II Tube type: Oral Tube size: 7.0 mm Number of attempts: 1 Airway Equipment and Method: Patient positioned with wedge pillow and Stylet Placement Confirmation: ETT inserted through vocal cords under direct vision,  positive ETCO2,  CO2 detector and breath sounds checked- equal and bilateral Secured at: 21 cm Tube secured with: Tape Dental Injury: Teeth and Oropharynx as per pre-operative assessment  Comments: Sabra Heck 2 yielded Grade 2 view with successful intubation on first attempt.  However, I would recommend Miller 3 for better visualization.  Ladora Daniel CRNA

## 2015-01-28 NOTE — Progress Notes (Signed)
Day of Surgery Procedure(s) (LRB): TOTAL SUPER CERVICAL ABDOMINAL HYSTERECTOMY BILATERAL SALPINGECTOMY (Bilateral) BILATERAL SALPINGECTOMY (Bilateral)  Subjective: Patient reports nausea,but no  vomiting, incisional paincontrolled and tolerating PO ice chips   Objective: I have reviewed patient's vital signs, intake and output and medications.  General: alert, cooperative and morbidly obese Resp: clear to auscultation bilaterally Cardio: regular rate and rhythm, S1, S2 normal, no murmur, click, rub or gallop GI: soft, non-tender; bowel sounds normal; no masses,  no organomegaly Extremities: extremities normal, atraumatic, no cyanosis or edema Vaginal Bleeding: none  Assessment: s/p Procedure(s): TOTAL SUPER CERVICAL ABDOMINAL HYSTERECTOMY BILATERAL SALPINGECTOMY (Bilateral) BILATERAL SALPINGECTOMY (Bilateral): stable  Plan: Advance diet Encourage ambulation Advance to PO medication  LOS: 0 days    Carlisle Enke P 01/28/2015, 6:21 PM

## 2015-01-28 NOTE — Anesthesia Postprocedure Evaluation (Signed)
  Anesthesia Post-op Note Anesthesia Post Note  Patient: Haley Wyatt  Procedure(s) Performed: Procedure(s) (LRB): TOTAL SUPER CERVICAL ABDOMINAL HYSTERECTOMY BILATERAL SALPINGECTOMY (Bilateral) BILATERAL SALPINGECTOMY (Bilateral)  Anesthesia type: General  Patient location: [facility]  Post pain: Pain level controlled  Post assessment: Post-op Vital signs reviewed  Last Vitals:  Filed Vitals:   01/28/15 1646  BP: 107/59  Pulse: 88  Temp: 37 C  Resp: 14    Post vital signs: Reviewed  Level of consciousness: sedated  Complications: No apparent anesthesia complications

## 2015-01-28 NOTE — Addendum Note (Signed)
Addendum  created 01/28/15 1803 by Asher Muir, CRNA   Modules edited: Notes Section   Notes Section:  File: [id number]

## 2015-01-28 NOTE — Transfer of Care (Signed)
Immediate Anesthesia Transfer of Care Note  Patient: Haley Wyatt  Procedure(s) Performed: Procedure(s): TOTAL SUPER CERVICAL ABDOMINAL HYSTERECTOMY BILATERAL SALPINGECTOMY (Bilateral) BILATERAL SALPINGECTOMY (Bilateral)  Patient Location: PACU  Anesthesia Type:General  Level of Consciousness: awake and patient cooperative  Airway & Oxygen Therapy: Patient Spontanous Breathing and Patient connected to nasal cannula oxygen  Post-op Assessment: Report given to RN and Post -op Vital signs reviewed and stable  Post vital signs: Reviewed and stable  Last Vitals:  Filed Vitals:   01/28/15 0635  BP: 115/71  Pulse: 87  Temp: 36.7 C  Resp: 20    Complications: No apparent anesthesia complications

## 2015-01-28 NOTE — H&P (Signed)
  History and Physical Interval Note:   01/28/2015   7:22 AM   Haley Wyatt  has presented today for surgery, with the diagnosis of Uterine Fibroids, Menorrhagia,   The various methods of treatment have been discussed with the patient and family. After consideration of risks, benefits and other options for treatment, the patient has consented to  Procedure(s): TOTAL SUPER CERVICAL ABDOMINAL HYSTERECTOMY BILATERAL SALPINGECTOMY BILATERAL SALPINGECTOMY as a surgical intervention .  I have reviewed the patients' chart and labs.  Questions were answered to the patient's satisfaction.  The pt understands the small chance that she will have subsequent bleeding as a result of leaving the cervix in place, but she continues to want supracervical hysterectomy.   Eldred Manges  MD

## 2015-01-28 NOTE — Anesthesia Postprocedure Evaluation (Signed)
  Anesthesia Post Note  Patient: Haley Wyatt  Procedure(s) Performed: Procedure(s) (LRB): TOTAL SUPER CERVICAL ABDOMINAL HYSTERECTOMY BILATERAL SALPINGECTOMY (Bilateral) BILATERAL SALPINGECTOMY (Bilateral)  Anesthesia type: GA  Patient location: PACU  Post pain: Pain level controlled  Post assessment: Post-op Vital signs reviewed  Last Vitals:  Filed Vitals:   01/28/15 1030  BP: 128/55  Pulse: 80  Temp:   Resp: 13    Post vital signs: Reviewed  Level of consciousness: sedated  Complications: No apparent anesthesia complications

## 2015-01-28 NOTE — Op Note (Signed)
01/28/2015  10:01 AM  PATIENT:  Haley Wyatt  39 y.o. female MRN:  [id number]  PRE-OPERATIVE DIAGNOSIS:  Uterine Fibroids, Menorrhagia, Dysmenorrhea  POST-OPERATIVE DIAGNOSIS:  Uterine Fibroids, Menorrhagia,  Dysmenorrhea  PROCEDURE:  Procedure(s):  SUPER CERVICAL ABDOMINAL HYSTERECTOMY BILATERAL SALPINGECTOMY BILATERAL SALPINGECTOMY  SURGEON:  Surgeon(s): Eldred Manges, MD   ASSISTANTS: Ena Dawley, MD   ANESTHESIA:  General  ESTIMATED BLOOD LOSS: 916XI  COMPLICATIONS:none  BLOOD ADMINISTERED:none  DRAINS: none   LOCAL MEDICATIONS USED:  MARCAINE    and Amount: 10 ml  SPECIMEN:  Source of Specimen:  uterine fundus.  Bilateral tubes with stitchin right tube  DISPOSITION OF SPECIMEN:  PATHOLOGY   FINDINGs:  The uterus was enlarged to approximately 16 weeks size with multiple serosal myomata. The tubes were status post tubal interruption for sterilization. The ovaries were normal bilaterally. There were no intraperitoneal excrescences. The examination of the upper abdomen was unremarkable.  COUNTS:  YES  PROCEDURE: The patient was taken to the operating room after appropriate identification and placed on the operating table in the supine position. Equipment for the induction of general anesthesia was placed and after the attainment of adequate general anesthesia the perineum, and vagina were prepped with multiple layers of Betadine.a Foley catheter was inserted into the bladder under sterile conditions and connected to straight drainage. The abdomen was prepped with ChloraPrep and allowed to dry. The abdomen was draped as a sterile field.  Suprapubic injection of 10 cc of quarter percent Marcaine was undertaken. A suprapubic incision was made and the abdomen opened in layers. Peritoneum was entered and a self-retaining retractor placed in the abdominal cavity. A bladder blade was placed. After visual and palpable intraperitoneal evaluation of the anatomy was carried  out large Kelly clamps were placed at the cornual regions of the uterus. The left round ligament was then identified clamped cut and suture ligated. That incision was taken anteriorly on the anterior leaf of the broad ligament. The utero-ovarian ligament was identified clamped cut, free tied and suture-ligated. A similar procedure was carried out on the opposite side with the round ligament and utero-ovarian ligament. The bladder flap was completed on the opposite side with incision of the anterior leaf of the broad ligament. The bladder was dissected off the anterior cervix with a combination of blunt and sharp dissection. The uterine arteries on the right and left side were clamped cut and suture ligated.  Parametial tissues on the right and left side were clamped, cut and suture-ligated down to the level of the cervix. The uterine fundus was excised at the level of the cervix and the endocervical canal cauterized. The fundus was removed from the operative field and the pelvic peritoneum closed over the cervical incision. All sutures to this point were 0 Vicryl.. Copious irrigation was carried out.  The fallopian tube on the left side was grasped with Babcock clamp and the mesial salpinx successively clamped, cut and suture ligated to remove the fallopian tube. Hemostasis was noted to be adequate. A similar procedure was carried out on the opposite side. A suture was placed in the right fallopian tube prior to removing it from the operative field. Hemostasis was noted to be adequate and all instruments were removed from the peritoneal cavity.  The abdominal peritoneum was closed using running suture of 2-0 Vicryl.  The rectus fascia was closed with running sutures of 0 Vicryl from each apex to  the midline and tied in the midline. The subcutaneous tissue was made hemostatic with  Bovie cautery and irrigated. Interrupted subcutaneous incisions of 2-0 plain allowed the skin incision to be well approximated. The  skin incision was closed with a subcuticular suture of 3-0 Monocryl. Sterile dressing was applied. The patient was awakened from general anesthesia and taken to the recovery room in satisfactory condition having tolerated the procedure well with  sponge and instrument counts correct.  PLAN OF CARE: Admit. After postanesthesia care  PATIENT DISPOSITION:  PACU - hemodynamically stable.   Delay start of Pharmacological VTE agent (>24hrs) due to surgical blood loss or risk of bleeding:  Yes.  SCD hose used during the case   Shatira Dobosz P 10:01 AM

## 2015-01-29 ENCOUNTER — Encounter (HOSPITAL_COMMUNITY): Payer: Self-pay | Admitting: Obstetrics and Gynecology

## 2015-01-29 LAB — CBC
HCT: 35.3 % — ABNORMAL LOW (ref 36.0–46.0)
Hemoglobin: 11.8 g/dL — ABNORMAL LOW (ref 12.0–15.0)
MCH: 27.8 pg (ref 26.0–34.0)
MCHC: 33.4 g/dL (ref 30.0–36.0)
MCV: 83.1 fL (ref 78.0–100.0)
Platelets: 212 K/uL (ref 150–400)
RBC: 4.25 MIL/uL (ref 3.87–5.11)
RDW: 14.9 % (ref 11.5–15.5)
WBC: 10.4 K/uL (ref 4.0–10.5)

## 2015-01-29 MED ORDER — OXYCODONE-ACETAMINOPHEN 5-325 MG PO TABS
1.0000 | ORAL_TABLET | ORAL | Status: DC | PRN
  Administered 2015-01-29 – 2015-01-30 (×6): 2 via ORAL
  Filled 2015-01-29 (×6): qty 2

## 2015-01-29 MED ORDER — KETOROLAC TROMETHAMINE 10 MG PO TABS
10.0000 mg | ORAL_TABLET | Freq: Four times a day (QID) | ORAL | Status: DC
  Administered 2015-01-29 – 2015-01-30 (×4): 10 mg via ORAL
  Filled 2015-01-29 (×6): qty 1

## 2015-01-29 NOTE — Progress Notes (Signed)
Haley Wyatt is a29 y.o.  [id number]  Post Op Date #1:  Abdominal Supra-cervical Hysterectomy/ BS  Subjective: Patient is Doing well postoperatively. Patient has Pain is controlled with current analgesics. Medications being used: PCA narcotic and Toradol. Has ambulated in the halls without difficulty, tolerating a regular diet and liquids but still has Foley catheter.   Objective: Vital signs in last 24 hours: Temp:  [98.5 F (36.9 C)-99.3 F (37.4 C)] 98.5 F (36.9 C) (04/07 0507) Pulse Rate:  [75-97] 75 (04/07 0212) Resp:  [11-19] 11 (04/07 0507) BP: (98-143)/(46-76) 98/46 mmHg (04/07 0212) SpO2:  [96 %-100 %] 96 % (04/07 0510) Weight:  [296 lb (134.265 kg)] 296 lb (134.265 kg) (04/06 2007)  Intake/Output from previous day: 04/06 0701 - 04/07 0700 In: 5171.7 [P.O.:880; I.V.:4291.7] Out: 1750 [Urine:1500]    EXAM: General: alert, cooperative and no distress Resp: clear to auscultation bilaterally Cardio: regular rate and rhythm, S1, S2 normal, no murmur, click, rub or gallop GI: Bowel sounds present, dressing is clean, dry and intact. Extremities: SCD hose in place, negative Homan's and no calf tenderness.   Assessment: s/p Procedure(s): TOTAL SUPER CERVICAL ABDOMINAL HYSTERECTOMY BILATERAL SALPINGECTOMY BILATERAL SALPINGECTOMY: stable and progressing well  Plan: Advance diet Encourage ambulation Advance to PO medication Discontinue Foley and PCA  Routine care  LOS: 1 day    POWELL,ELMIRA, PA-C 01/29/2015 7:39 AM

## 2015-01-29 NOTE — Progress Notes (Signed)
1 Day Post-Op Procedure(s) (LRB): TOTAL SUPER CERVICAL ABDOMINAL HYSTERECTOMY BILATERAL SALPINGECTOMY (Bilateral) BILATERAL SALPINGECTOMY (Bilateral)  Subjective: Patient reports no nausea, vomiting, incisional pain and tolerating PO.    Objective: I have reviewed patient's vital signs, intake and output, medications and labs.  General: alert, cooperative and morbidly obese Resp: clear to auscultation bilaterally Cardio: regular rate and rhythm, S1, S2 normal, no murmur, click, rub or gallop GI: soft, non-tender; bowel sounds normal; no masses,  no organomegaly.  Dressing dry Extremities: extremities normal, atraumatic, no cyanosis or edema Vaginal Bleeding: none  Assessment: s/p Procedure(s): TOTAL SUPER CERVICAL ABDOMINAL HYSTERECTOMY BILATERAL SALPINGECTOMY (Bilateral) BILATERAL SALPINGECTOMY (Bilateral): stable, progressing well and tolerating diet  Plan: Advance diet Encourage ambulation Advance to PO medication Discontinue IV fluids Discharge home 01/30/15  LOS: 1 day    Shaine Mount P 01/29/2015, 8:22 AM

## 2015-01-30 MED ORDER — KETOROLAC TROMETHAMINE 10 MG PO TABS: ORAL_TABLET | ORAL | Status: DC

## 2015-01-30 MED ORDER — OXYCODONE-ACETAMINOPHEN 5-325 MG PO TABS: 1.0000 | ORAL_TABLET | ORAL | Status: DC | PRN

## 2015-01-30 MED ORDER — ONDANSETRON HCL 4 MG PO TABS: 4.0000 mg | ORAL_TABLET | Freq: Four times a day (QID) | ORAL | Status: DC | PRN

## 2015-01-30 NOTE — Discharge Summary (Signed)
Physician Discharge Summary  Patient ID: Haley Wyatt MRN: [id number] DOB/AGE: [birth year] 39 y.o.  Admit date: 01/28/2015 Discharge date: 01/30/2015   Discharge Diagnoses:  Uterine Fibroids, Menorrhagia and Dysmenorrhea Active Problems:   Fibroids, intramural   Menorrhagia   Dysmenorrhea   Fibroid   Operation: Abdominal Supra-cervical Hysterectomy with Bilateral Salpingectomy   Discharged Condition: Good  Hospital Course: On the date of admission the patient underwent the aforementioned procedures and tolerated them well.  Post operative course was unremarkable with the patient tolerating a hemoglobin of 11.8 and resuming bowel and  bladder function by post operative day #2.  Having achieve the maximum benefit of her hospital stay,  the patient was deemed ready for discharge home on post operative day #2.  Pathology report:  Uterine fibroids  Disposition: 01-Home or Self Care  Discharge Medications:    Medication List    STOP taking these medications        HYDROcodone-acetaminophen 5-325 MG per tablet  Commonly known as:  NORCO/VICODIN     naproxen 500 MG tablet  Commonly known as:  NAPROSYN      TAKE these medications        B-12 1000 MCG/ML Kit  Inject 1 each as directed once a week. Patient receives injection on Thursday.     bismuth subsalicylate 240 XB/35HG suspension  Commonly known as:  PEPTO BISMOL  Take 15 mLs by mouth daily as needed.     calcium carbonate (dosed in mg elemental calcium) 1250 (500 CA) MG/5ML  Take 500 mg of elemental calcium by mouth 2 (two) times daily.     cyclobenzaprine 10 MG tablet  Commonly known as:  FLEXERIL  Take 1 tablet (10 mg total) by mouth 2 (two) times daily as needed for muscle spasms.     geriatric multivitamins-minerals Elix  Take 15 mLs by mouth 2 (two) times daily.     IRON PO  Take 18 mg by mouth daily. Patient takes [place] Liquid Iron 27m/15mL daily.     ketorolac 10 MG tablet  Commonly known as:   TORADOL  1  po  pc every 6 hours x 3 days then prn-pain     ondansetron 4 MG tablet  Commonly known as:  ZOFRAN  Take 1 tablet (4 mg total) by mouth every 6 (six) hours as needed for nausea.     oxyCODONE-acetaminophen 5-325 MG per tablet  Commonly known as:  PERCOCET/ROXICET  Take 1-2 tablets by mouth every 4 (four) hours as needed for moderate pain.         Follow-up: Dr. VLorriane ShireP. Wise Fees on Mar 18, 2015 at 9 a.m.   Signed:Earnstine Regal PA-C 01/30/2015, 7:49 AM

## 2015-01-30 NOTE — Progress Notes (Signed)
Haley Wyatt is a53 y.o.  [id number]  Post Op Date # 2: Abdominal Supra-cervical Hysterectomy/BS  Subjective: Patient is Doing well postoperatively. Patient has good pain control with Percocet and Toradol. Is ambulating, tolerating a regular diet and voiding without difficulty.   Objective: Vital signs in last 24 hours: Temp:  [98.5 F (36.9 C)-99.3 F (37.4 C)] 98.7 F (37.1 C) (04/08 0549) Pulse Rate:  [67-87] 87 (04/08 0549) Resp:  [16-18] 16 (04/08 0549) BP: (100-130)/(49-65) 130/65 mmHg (04/08 0549) SpO2:  [99 %-100 %] 100 % (04/08 0549)  Intake/Output from previous day: 04/07 0701 - 04/08 0700 In: -  Out: 250 [Urine:250]     Recent Labs Lab 01/29/15 0535  WBC 10.4  HGB 11.8*  HCT 35.3*  PLT 212    EXAM: General: alert, cooperative, no distress and "Ready to go home" (per patient) Resp: clear to auscultation bilaterally Cardio: regular rate and rhythm, S1, S2 normal, no murmur, click, rub or gallop GI: Bowel sounds present, honeycomb dressing is clean, dry and intact. Extremities: No calf tenderness Vaginal Bleeding: none   Assessment: s/p Procedure(s): TOTAL SUPER CERVICAL ABDOMINAL HYSTERECTOMY BILATERAL SALPINGECTOMY BILATERAL SALPINGECTOMY: stable and progressing well  Plan: Discharge home  LOS: 2 days    POWELL,ELMIRA, PA-C 01/30/2015 7:34 AM

## 2015-01-30 NOTE — Progress Notes (Signed)
Pt out in wheelchair teaching complete  

## 2015-01-30 NOTE — Discharge Instructions (Signed)
Call [facility] @ [phone] if:  You have a temperature greater than or equal to 100.4 degrees Farenheit orally You have pain that is not made better by the pain medication given and taken as directed You have excessive bleeding or problems urinating  Take Colace (Docusate Sodium/Stool Softener) 100 mg 2-3 times daily while taking narcotic pain medicine to avoid constipation or until bowel movements are regular. Take Ketorolac 10 mg (Toradol) with food every 6 hours for 3 days then as needed for pain  You may drive after 2 weeks You may walk up steps  You may shower  You may resume a regular diet  Keep incisions clean and dry Do not lift over 15 pounds for 6 weeks Avoid anything in vagina for 6 weeks (or until after your post-operative visit)

## 2015-02-06 ENCOUNTER — Encounter (HOSPITAL_COMMUNITY): Payer: Self-pay | Admitting: *Deleted

## 2015-02-06 ENCOUNTER — Inpatient Hospital Stay (HOSPITAL_COMMUNITY)
Admission: AD | Admit: 2015-02-06 | Discharge: 2015-02-06 | Disposition: A | Discharge status: Home / Self Care | Payer: 59 | Source: Ambulatory Visit | Attending: Obstetrics and Gynecology | Admitting: Obstetrics and Gynecology

## 2015-02-06 DIAGNOSIS — Z90710 Acquired absence of both cervix and uterus: Secondary | ICD-10-CM | POA: Diagnosis not present

## 2015-02-06 DIAGNOSIS — Z87891 Personal history of nicotine dependence: Secondary | ICD-10-CM | POA: Diagnosis not present

## 2015-02-06 DIAGNOSIS — K5900 Constipation, unspecified: Secondary | ICD-10-CM | POA: Insufficient documentation

## 2015-02-06 NOTE — Discharge Instructions (Signed)
Constipation °Constipation is when a person has fewer than three bowel movements a week, has difficulty having a bowel movement, or has stools that are dry, hard, or larger than normal. As people grow older, constipation is more common. If you try to fix constipation with medicines that make you have a bowel movement (laxatives), the problem may get worse. Long-term laxative use may cause the muscles of the colon to become weak. A low-fiber diet, not taking in enough fluids, and taking certain medicines may make constipation worse.  °CAUSES  °· Certain medicines, such as antidepressants, pain medicine, iron supplements, antacids, and water pills.   °· Certain diseases, such as diabetes, irritable bowel syndrome (IBS), thyroid disease, or depression.   °· Not drinking enough water.   °· Not eating enough fiber-rich foods.   °· Stress or travel.   °· Lack of physical activity or exercise.   °· Ignoring the urge to have a bowel movement.   °· Using laxatives too much.   °SIGNS AND SYMPTOMS  °· Having fewer than three bowel movements a week.   °· Straining to have a bowel movement.   °· Having stools that are hard, dry, or larger than normal.   °· Feeling full or bloated.   °· Pain in the lower abdomen.   °· Not feeling relief after having a bowel movement.   °DIAGNOSIS  °Your health care provider will take a medical history and perform a physical exam. Further testing may be done for severe constipation. Some tests may include: °· A barium enema X-ray to examine your rectum, colon, and, sometimes, your small intestine.   °· A sigmoidoscopy to examine your lower colon.   °· A colonoscopy to examine your entire colon. °TREATMENT  °Treatment will depend on the severity of your constipation and what is causing it. Some dietary treatments include drinking more fluids and eating more fiber-rich foods. Lifestyle treatments may include regular exercise. If these diet and lifestyle recommendations do not help, your health care  provider may recommend taking over-the-counter laxative medicines to help you have bowel movements. Prescription medicines may be prescribed if over-the-counter medicines do not work.  °HOME CARE INSTRUCTIONS  °· Eat foods that have a lot of fiber, such as fruits, vegetables, whole grains, and beans. °· Limit foods high in fat and processed sugars, such as french fries, hamburgers, cookies, candies, and soda.   °· A fiber supplement may be added to your diet if you cannot get enough fiber from foods.   °· Drink enough fluids to keep your urine clear or pale yellow.   °· Exercise regularly or as directed by your health care provider.   °· Go to the restroom when you have the urge to go. Do not hold it.   °· Only take over-the-counter or prescription medicines as directed by your health care provider. Do not take other medicines for constipation without talking to your health care provider first.   °SEEK IMMEDIATE MEDICAL CARE IF:  °· You have bright red blood in your stool.   °· Your constipation lasts for more than 4 days or gets worse.   °· You have abdominal or rectal pain.   °· You have thin, pencil-like stools.   °· You have unexplained weight loss. °MAKE SURE YOU:  °· Understand these instructions. °· Will watch your condition. °· Will get help right away if you are not doing well or get worse. °Document Released: 07/08/2004 Document Revised: 10/15/2013 Document Reviewed: 07/22/2013 °Exit[employer]® Patient Information ©2015 [employer]. This information is not intended to replace advice given to you by your health care provider. Make sure you discuss any questions   you have with your health care provider. High-Fiber Diet Fiber is found in fruits, vegetables, and grains. A high-fiber diet encourages the addition of more whole grains, legumes, fruits, and vegetables in your diet. The recommended amount of fiber for adult males is 38 g per day. For adult females, it is 25 g per day. Pregnant and lactating women  should get 28 g of fiber per day. If you have a digestive or bowel problem, ask your caregiver for advice before adding high-fiber foods to your diet. Eat a variety of high-fiber foods instead of only a select few type of foods.  PURPOSE  To increase stool bulk.  To make bowel movements more regular to prevent constipation.  To lower cholesterol.  To prevent overeating. WHEN IS THIS DIET USED?  It may be used if you have constipation and hemorrhoids.  It may be used if you have uncomplicated diverticulosis (intestine condition) and irritable bowel syndrome.  It may be used if you need help with weight management.  It may be used if you want to add it to your diet as a protective measure against atherosclerosis, diabetes, and cancer. SOURCES OF FIBER  Whole-grain breads and cereals.  Fruits, such as apples, oranges, bananas, berries, prunes, and pears.  Vegetables, such as green peas, carrots, sweet potatoes, beets, broccoli, cabbage, spinach, and artichokes.  Legumes, such split peas, soy, lentils.  Almonds. FIBER CONTENT IN FOODS Starches and Grains / Dietary Fiber (g)  Cheerios, 1 cup / 3 g  Corn Flakes cereal, 1 cup / 0.7 g  Rice crispy treat cereal, 1 cup / 0.3 g  Instant oatmeal (cooked),  cup / 2 g  Frosted wheat cereal, 1 cup / 5.1 g  Brown, long-grain rice (cooked), 1 cup / 3.5 g  White, long-grain rice (cooked), 1 cup / 0.6 g  Enriched macaroni (cooked), 1 cup / 2.5 g Legumes / Dietary Fiber (g)  Baked beans (canned, plain, or vegetarian),  cup / 5.2 g  Kidney beans (canned),  cup / 6.8 g  Pinto beans (cooked),  cup / 5.5 g Breads and Crackers / Dietary Fiber (g)  Plain or honey graham crackers, 2 squares / 0.7 g  Saltine crackers, 3 squares / 0.3 g  Plain, salted pretzels, 10 pieces / 1.8 g  Whole-wheat bread, 1 slice / 1.9 g  White bread, 1 slice / 0.7 g  Raisin bread, 1 slice / 1.2 g  Plain bagel, 3 oz / 2 g  Flour tortilla, 1 oz  / 0.9 g  Corn tortilla, 1 small / 1.5 g  Hamburger or hotdog bun, 1 small / 0.9 g Fruits / Dietary Fiber (g)  Apple with skin, 1 medium / 4.4 g  Sweetened applesauce,  cup / 1.5 g  Banana,  medium / 1.5 g  Grapes, 10 grapes / 0.4 g  Orange, 1 small / 2.3 g  Raisin, 1.5 oz / 1.6 g  Melon, 1 cup / 1.4 g Vegetables / Dietary Fiber (g)  Green beans (canned),  cup / 1.3 g  Carrots (cooked),  cup / 2.3 g  Broccoli (cooked),  cup / 2.8 g  Peas (cooked),  cup / 4.4 g  Mashed potatoes,  cup / 1.6 g  Lettuce, 1 cup / 0.5 g  Corn (canned),  cup / 1.6 g  Tomato,  cup / 1.1 g Document Released: 10/10/2005 Document Revised: 04/10/2012 Document Reviewed: 01/12/2012 ExitCare Patient Information 2015 [place], [place]. This information is not intended to replace advice  given to you by your health care provider. Make sure you discuss any questions you have with your health care provider. Fecal Impaction A fecal impaction happens when there is a large, firm amount of stool (or feces) that cannot be passed. The impacted stool is usually in the rectum, which is the lowest part of the large bowel. The impacted stool can block the colon and cause significant problems. CAUSES  The longer stool stays in the rectum, the harder it gets. Anything that slows down your bowel movements can lead to fecal impaction, such as:  Constipation. This can be a long-standing (chronic) problem or can happen suddenly (acute).  Painful conditions of the rectum, such as hemorrhoids or anal fissures. The pain of these conditions can make you try to avoid having bowel movements.  Narcotic pain-relieving medicines, such as methadone, morphine, or codeine.  Not drinking enough fluids.  Inactivity and bed rest over long periods of time.  Diseases of the brain or nervous system that damage the nerves controlling the muscles of the intestines. SIGNS AND SYMPTOMS   Lack of normal bowel movements or  changes in bowel patterns.  Sense of fullness in the rectum but unable to pass stool.  Pain or cramps in the abdominal area (often after meals).  Thin, watery discharge from the rectum. DIAGNOSIS  Your health care provider may suspect that you have a fecal impaction based on your symptoms and a physical exam. This will include an exam of your rectum. Sometimes X-rays or lab testing may be needed to confirm the diagnosis and to be sure there are no other problems.  TREATMENT   Initially an impaction can be removed manually. Using a gloved finger, your health care provider can remove hard stool from your rectum.  Medicine is sometimes needed. A suppository or enema can be given in the rectum to soften the stool, which can stimulate a bowel movement. Medicines can also be given by mouth (orally).  Though rare, surgery may be needed if the colon has torn (perforated) due to blockage. HOME CARE INSTRUCTIONS   Develop regular bowel habits. This could include getting in the habit of having a bowel movement after your morning cup of coffee or after eating. Be sure to allow yourself enough time on the toilet.  Maintain a high-fiber diet.  Drink enough fluids to keep your urine clear or pale yellow as directed by your health care provider.  Exercise regularly.  If you begin to get constipated, increase the amount of fiber in your diet. Eat plenty of fruits, vegetables, whole wheat breads, bran, oatmeal, and similar products.  Take natural fiber laxatives or other laxatives only as directed by your health care provider. SEEK MEDICAL CARE IF:   You have ongoing rectal pain.  You require enemas or suppositories more than twice a week.  You have rectal bleeding.  You have continued problems, or you develop abdominal pain.  You have thin, pencil-like stools. SEEK IMMEDIATE MEDICAL CARE IF:  You have black or tarry stools. MAKE SURE YOU:   Understand these instructions.  Will watch your  condition.  Will get help right away if you are not doing well or get worse. Document Released: 07/02/2004 Document Revised: 07/31/2013 Document Reviewed: 04/16/2013 [facility] Patient Information 2015 [place], [place]. This information is not intended to replace advice given to you by your health care provider. Make sure you discuss any questions you have with your health care provider.

## 2015-02-06 NOTE — MAU Provider Note (Signed)
History    Haley Wyatt is a 39 y.o. Female s/p abdominal hysterectomy.  Patient reports having constipation since post-surgery.  She states that she does a lot of straining to move bowels and stools are hard. However, patient denies hemorrhoids.  She further denies vaginal issues including bleeding and discharge.  Patient reports taking 2 stool softners twice daily, laxatives, and a fleets enema.  Reports husband had to do "removal of impaction."  Patient states she took a fleets enema three days ago with some relief. However, she reports that she has had no bowel movement since then. But admits to "small" bowel movement prior to coming. Patient reports taking one percocet tablet every 4 hours and toradol q6 hrs. However, patient states she took no pain medication yesterday, but did take a dose prior to coming per provider instruction.  Patient reports inadequate water intake at ~ 4 bottles daily.  Patient reports recall of dinner yesterday: -Soil scientist with one bun, breakfast this am: -Coffee and lunch today: -1/2 Salad with deli meat.  Patient reports fear of repeat impaction and has thus not been eating much.   Patient Active Problem List   Diagnosis Date Noted  . Fibroids, intramural 01/28/2015  . Menorrhagia 01/28/2015  . Dysmenorrhea 01/28/2015  . Fibroid 01/28/2015  . 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 Headache 07/16/2013  . Encounter for routine gynecological examination 07/16/2013  . Morbid obesity 07/16/2013  . Pre-diabetes 01/31/2013  . Left shoulder pain 10/28/2011  . 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 HYPERTENSION 08/16/2010  . 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 LEG EDEMA, 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  . GERD 06/11/2010  . HYPOTHYROIDISM 04/16/2009  . OBESITY 04/16/2009  . DEPRESSION 03/30/2009    Chief Complaint  Patient presents with  . Constipation   . Wound Check   HPI  OB History    No data available      Past Medical History  Diagnosis Date  . Allergy   . Fibroid tumor     inside uterus  . Back pain     "muscle strain from exercising"  . H/O hiatal hernia   . Borderline diabetes     states "levels have been about 102"  . Hypertension     NO MEDS.. BP CONTROLLED  . GERD (gastroesophageal reflux disease)     PT STATES "DOES NOT HAVE IT NOW"    Past Surgical History  Procedure Laterality Date  . Shoulder surgery      left shoulder loose body removal  . Carpal tunnel release    . Tubal ligation    . Laparoscopic gastric sleeve resection N/A 02/11/2014    Procedure: LAPAROSCOPIC GASTRIC SLEEVE RESECTION;  Surgeon: Pedro Earls, MD;  Location: [facility];  Service: General;  Laterality: N/A;  . Hiatal hernia repair N/A 02/11/2014    Procedure: LAPAROSCOPIC REPAIR OF HIATAL HERNIA;  Surgeon: Pedro Earls, MD;  Location: [facility];  Service: General;  Laterality: N/A;  . Upper gi endoscopy  02/11/2014    Procedure: UPPER GI ENDOSCOPY;  Surgeon: Pedro Earls, MD;  Location: [facility];  Service: General;;  . Dilitation & currettage/hystroscopy with hydrothermal ablation N/A 04/16/2014    Procedure: DILATATION & CURETTAGE/HYSTEROSCOPY WITH HYDROTHERMAL ABLATION;  Surgeon: Frederico Hamman, MD;  Location: [place];  Service: Gynecology;  Laterality: N/A;  . Supracervical abdominal hysterectomy Bilateral 01/28/2015    Procedure: TOTAL SUPER CERVICAL ABDOMINAL HYSTERECTOMY  BILATERAL SALPINGECTOMY;  Surgeon: Eldred Manges, MD;  Location: [facility];  Service: Gynecology;  Laterality: Bilateral;  . Bilateral salpingectomy Bilateral 01/28/2015    Procedure: BILATERAL SALPINGECTOMY;  Surgeon: Eldred Manges, MD;  Location: [facility];  Service: Gynecology;  Laterality: Bilateral;  . Abdominal hysterectomy      Family History  Problem Relation Age of Onset  . Hyperlipidemia Father   . Hypertension Father   . Diabetes Father   .  Sudden death Neg Hx   . Heart attack Neg Hx   . Cancer Mother     History  Substance Use Topics  . Smoking status: Former Smoker    Types: Cigarettes    Quit date: 02/05/2006  . Smokeless tobacco: Not on file  . Alcohol Use: No    Allergies: No Known Allergies  Prescriptions prior to admission  Medication Sig Dispense Refill Last Dose  . calcium carbonate, dosed in mg elemental calcium, 1250 (500 CA) MG/5ML Take 500 mg of elemental calcium by mouth 2 (two) times daily.   02/06/2015 at Unknown time  . Cyanocobalamin (B-12) 1000 MCG/ML KIT Inject 1 each as directed once a week. Patient receives injection on Thursday.   Past Month at Unknown time  . geriatric multivitamins-minerals (ELDERTONIC/GEVRABON) ELIX Take 15 mLs by mouth 2 (two) times daily.   02/06/2015 at Unknown time  . IRON PO Take 18 mg by mouth daily. Patient takes [place] Liquid Iron $RemoveBef'18mg'AMqxHRdVAG$ /65mL daily.   02/06/2015 at Unknown time  . ketorolac (TORADOL) 10 MG tablet 1  po  pc every 6 hours x 3 days then prn-pain (Patient taking differently: Take 10 mg by mouth every 6 (six) hours as needed for moderate pain. ) 20 tablet 0 02/06/2015 at Unknown time  . ondansetron (ZOFRAN) 4 MG tablet Take 1 tablet (4 mg total) by mouth every 6 (six) hours as needed for nausea. 20 tablet 0 Past Week at Unknown time  . oxyCODONE-acetaminophen (PERCOCET/ROXICET) 5-325 MG per tablet Take 1-2 tablets by mouth every 4 (four) hours as needed for moderate pain. 45 tablet 0 02/06/2015 at 1800  . cyclobenzaprine (FLEXERIL) 10 MG tablet Take 1 tablet (10 mg total) by mouth 2 (two) times daily as needed for muscle spasms. (Patient not taking: Reported on 02/06/2015) 20 tablet 0 Not Taking at Unknown time    ROS  See HPI Above Physical Exam   Blood pressure 133/78, pulse 76, temperature 98.3 F (36.8 C), resp. rate 18, height $RemoveBe'5\' 10"'oFyDiecsY$  (1.778 m), weight 304 lb 12.8 oz (138.256 kg).  No results found for this or any previous visit (from the past 24  hour(s)).  Physical Exam  Constitutional: She is oriented to person, place, and time. She appears well-developed. No distress.  HENT:  Head: Normocephalic.  Eyes: EOM are normal.  Neck: Normal range of motion.  Cardiovascular: Normal rate and regular rhythm.   Respiratory: Effort normal and breath sounds normal.  GI: Soft. Bowel sounds are normal. She exhibits no distension. There is tenderness.  Incision: Well approximated.  No redness, discharge, or bleeding noted.  Small nodule palpated on left lower quadrant beneath incision; Tender to touch. Small bruise noted above incision.   Musculoskeletal: Normal range of motion.  Neurological: She is alert and oriented to person, place, and time.  Skin: Skin is warm and dry.      ED Course  Assessment: 39 y.o.Female S/P Hysterectomy Constipation  Plan: -Discussed proper nutritional intake including high fiber and increased water intake  -Discussed usage  of pain and antiemetic medications effects of bowel regime -Discussed healing process and expectations in regards to hysterectomy (i.e. change in bowel pattern, numbness/tingling at incision site) -Discussed proper usage and difference between laxatives, stool softners, and enemas -Patient requests soap sud enema despite bowel movement prior to arrival -Soap sud enema ordered -Will follow up after administration  Follow Up (2300) -Patient discharged prior to provider follow up, but after administration per patient request -Patient with post op appt on Mar 18, 2015  [facility], Kaydense Rizo [facility] CNM, MSN 02/06/2015 8:56 PM

## 2015-02-06 NOTE — Progress Notes (Signed)
Gavin Pound CNM notified of pt's admission and status. Will see pt

## 2015-02-06 NOTE — Progress Notes (Signed)
Written and verbal d/c instructions given and understanding voiced. 

## 2015-02-06 NOTE — MAU Note (Signed)
Had abd hyst on 4/6. Incision has been smooth until last night when noticed  some bumps on it. Having problems with constipation. Had small BM today but before today, no BM for 4 days.

## 2015-02-06 NOTE — MAU Note (Signed)
Esig not working so pt signed hard copy and placed on chart

## 2016-03-15 ENCOUNTER — Ambulatory Visit (INDEPENDENT_AMBULATORY_CARE_PROVIDER_SITE_OTHER): Payer: BLUE CROSS/BLUE SHIELD

## 2016-03-15 ENCOUNTER — Encounter (HOSPITAL_COMMUNITY): Payer: Self-pay

## 2016-03-15 ENCOUNTER — Ambulatory Visit (HOSPITAL_COMMUNITY)
Admission: EM | Admit: 2016-03-15 | Discharge: 2016-03-15 | Disposition: A | Discharge status: Home / Self Care | Payer: BLUE CROSS/BLUE SHIELD | Attending: Family Medicine | Admitting: Family Medicine

## 2016-03-15 DIAGNOSIS — S90111A Contusion of right great toe without damage to nail, initial encounter: Secondary | ICD-10-CM

## 2016-03-15 MED ORDER — HYDROCODONE-ACETAMINOPHEN 5-325 MG PO TABS: 2.0000 | ORAL_TABLET | ORAL | Status: DC | PRN

## 2016-03-15 NOTE — ED Notes (Signed)
Patient presents with injury to let foot she states a computer fell on her foot today while at work, she has taken Tylenol 500 mg @ 4pm today No acute distress

## 2016-03-15 NOTE — Discharge Instructions (Signed)

## 2016-03-15 NOTE — ED Provider Notes (Signed)
CSN: [id number]     Arrival date & time 03/15/16  1945 History   First MD Initiated Contact with Patient 03/15/16 2016     Chief Complaint  Patient presents with  . Foot Injury   (Consider location/radiation/quality/duration/timing/severity/associated sxs/prior Treatment) HPI History obtained from patient: Location: Left great toe  Context/Duration: 4 PM dropped a computer on the left side  Severity: 5  Quality: Aching Timing:           Constant Home Treatment: Tylenol Associated symptoms:  Hurts to walk Family History: Hypertension diabetes    Past Medical History  Diagnosis Date  . Allergy   . Fibroid tumor     inside uterus  . Back pain     "muscle strain from exercising"  . H/O hiatal hernia   . Borderline diabetes     states "levels have been about 102"  . Hypertension     NO MEDS.. BP CONTROLLED  . GERD (gastroesophageal reflux disease)     PT STATES "DOES NOT HAVE IT NOW"   Past Surgical History  Procedure Laterality Date  . Shoulder surgery      left shoulder loose body removal  . Carpal tunnel release    . Tubal ligation    . Laparoscopic gastric sleeve resection N/A 02/11/2014    Procedure: LAPAROSCOPIC GASTRIC SLEEVE RESECTION;  Surgeon: Pedro Earls, MD;  Location: [facility];  Service: General;  Laterality: N/A;  . Hiatal hernia repair N/A 02/11/2014    Procedure: LAPAROSCOPIC REPAIR OF HIATAL HERNIA;  Surgeon: Pedro Earls, MD;  Location: [facility];  Service: General;  Laterality: N/A;  . Upper gi endoscopy  02/11/2014    Procedure: UPPER GI ENDOSCOPY;  Surgeon: Pedro Earls, MD;  Location: [facility];  Service: General;;  . Dilitation & currettage/hystroscopy with hydrothermal ablation N/A 04/16/2014    Procedure: DILATATION & CURETTAGE/HYSTEROSCOPY WITH HYDROTHERMAL ABLATION;  Surgeon: Frederico Hamman, MD;  Location: [facility];  Service: Gynecology;  Laterality: N/A;  . Supracervical abdominal hysterectomy Bilateral 01/28/2015    Procedure: TOTAL SUPER  CERVICAL ABDOMINAL HYSTERECTOMY BILATERAL SALPINGECTOMY;  Surgeon: Eldred Manges, MD;  Location: [place] [facility];  Service: Gynecology;  Laterality: Bilateral;  . Bilateral salpingectomy Bilateral 01/28/2015    Procedure: BILATERAL SALPINGECTOMY;  Surgeon: Eldred Manges, MD;  Location: [facility];  Service: Gynecology;  Laterality: Bilateral;  . Abdominal hysterectomy     Family History  Problem Relation Age of Onset  . Hyperlipidemia Father   . Hypertension Father   . Diabetes Father   . Sudden death Neg Hx   . Heart attack Neg Hx   . Cancer Mother    Social History  Substance Use Topics  . Smoking status: Former Smoker    Types: Cigarettes    Quit date: 02/05/2006  . Smokeless tobacco: None  . Alcohol Use: No   OB History    Gravida Para Term Preterm AB TAB SAB Ectopic Multiple Living   4         4     Review of Systems  Denies: HEADACHE, NAUSEA, ABDOMINAL PAIN, CHEST PAIN, CONGESTION, DYSURIA, SHORTNESS OF BREATH  Allergies  Review of patient's allergies indicates no known allergies.  Home Medications   Prior to Admission medications   Medication Sig Start Date End Date Taking? Authorizing Provider  calcium carbonate, dosed in mg elemental calcium, 1250 (500 CA) MG/5ML Take 500 mg of elemental calcium by mouth 2 (two) times daily.   Yes Historical Provider, MD  Cyanocobalamin (B-12) 1000 MCG/ML KIT Inject 1 each as directed once a week. Patient receives injection on Thursday.   Yes Historical Provider, MD  geriatric multivitamins-minerals (ELDERTONIC/GEVRABON) ELIX Take 15 mLs by mouth 2 (two) times daily.   Yes Historical Provider, MD  IRON PO Take 18 mg by mouth daily. Patient takes [place] Liquid Iron 25m/15mL daily.   Yes Historical Provider, MD  cyclobenzaprine (FLEXERIL) 10 MG tablet Take 1 tablet (10 mg total) by mouth 2 (two) times daily as needed for muscle spasms. Patient not taking: Reported on 02/06/2015 12/29/14   LFransico Meadow PA-C  ketorolac (TORADOL) 10 MG  tablet 1  po  pc every 6 hours x 3 days then prn-pain Patient taking differently: Take 10 mg by mouth every 6 (six) hours as needed for moderate pain.  01/30/15   Elmira Powell, PA-C  ondansetron (ZOFRAN) 4 MG tablet Take 1 tablet (4 mg total) by mouth every 6 (six) hours as needed for nausea. 01/30/15   EEarnstine Regal PA-C  oxyCODONE-acetaminophen (PERCOCET/ROXICET) 5-325 MG per tablet Take 1-2 tablets by mouth every 4 (four) hours as needed for moderate pain. 01/30/15   EEarnstine Regal PA-C   Meds Ordered and Administered this Visit  Medications - No data to display  BP 134/84 mmHg  Pulse 87  Temp(Src) 97.7 F (36.5 C) (Oral)  SpO2 100%  LMP 04/28/2014 No data found.   Physical Exam NURSES NOTES AND VITAL SIGNS REVIEWED. CONSTITUTIONAL: Well developed, well nourished, no acute distress HEENT: normocephalic, atraumatic EYES: Conjunctiva normal NECK:normal ROM, supple, no adenopathy PULMONARY:No respiratory distress, normal effort ABDOMINAL: Soft, ND, NT BS+, No CVAT MUSCULOSKELETAL: Normal ROM of all extremities, Left Great toe, swollen, tender to palpation. No nail injury SKIN: warm and dry without rash PSYCHIATRIC: Mood and affect, behavior are normal  ED Course  Procedures (including critical care time)  Labs Review Labs Reviewed - No data to display  Imaging Review Dg Toe Great Left  03/15/2016  CLINICAL DATA:  Crush injury to the left first toe. EXAM: LEFT GREAT TOE COMPARISON:  None. FINDINGS: There is no evidence of fracture or dislocation. There is no evidence of arthropathy or other focal bone abnormality. Soft tissues are unremarkable. IMPRESSION: Negative. Electronically Signed   By: JIlona SorrelM.D.   On: 03/15/2016 20:36   I HAVE PERSONALLY  REVIEWED AND DISCUSSED RESULTS OF  X-RAYS WITH PATIENT PRIOR TO DISCHARGE.     Visual Acuity Review  Right Eye Distance:   Left Eye Distance:   Bilateral Distance:    Right Eye Near:   Left Eye Near:    Bilateral  Near:      Rx for norco Work note provided Post op shoe   MDM   1. Contusion of great toe of right foot, initial encounter     Patient is reassured that there are no issues that require transfer to higher level of care at this time or additional tests. Patient is advised to continue home symptomatic treatment. Patient is advised that if there are new or worsening symptoms to attend the emergency department, contact primary care provider, or return to UC. Instructions of care provided discharged home in stable condition.    THIS NOTE WAS GENERATED USING A VOICE RECOGNITION SOFTWARE PROGRAM. ALL REASONABLE EFFORTS  WERE MADE TO PROOFREAD THIS DOCUMENT FOR ACCURACY.  I have verbally reviewed the discharge instructions with the patient. A printed AVS was given to the patient.  All questions were answered prior to discharge.  Konrad Felix, [place] 03/15/16 2044

## 2016-08-25 ENCOUNTER — Emergency Department (HOSPITAL_COMMUNITY): Payer: Self-pay

## 2016-08-25 ENCOUNTER — Encounter (HOSPITAL_COMMUNITY): Payer: Self-pay | Admitting: Emergency Medicine

## 2016-08-25 DIAGNOSIS — J9801 Acute bronchospasm: Secondary | ICD-10-CM | POA: Insufficient documentation

## 2016-08-25 DIAGNOSIS — E039 Hypothyroidism, unspecified: Secondary | ICD-10-CM | POA: Insufficient documentation

## 2016-08-25 DIAGNOSIS — I10 Essential (primary) hypertension: Secondary | ICD-10-CM | POA: Insufficient documentation

## 2016-08-25 DIAGNOSIS — Z87891 Personal history of nicotine dependence: Secondary | ICD-10-CM | POA: Insufficient documentation

## 2016-08-25 DIAGNOSIS — Z79899 Other long term (current) drug therapy: Secondary | ICD-10-CM | POA: Insufficient documentation

## 2016-08-25 DIAGNOSIS — J069 Acute upper respiratory infection, unspecified: Secondary | ICD-10-CM | POA: Insufficient documentation

## 2016-08-25 NOTE — ED Triage Notes (Signed)
Pt presents to ED after she was "really sick" last week with flu-like symptoms.  Pt sts she has been having SOB, CP and generalized fatigue/body aches ever since.  Pt c/o feeling "run down" more easily than normal.

## 2016-08-26 ENCOUNTER — Emergency Department (HOSPITAL_COMMUNITY)
Admission: EM | Admit: 2016-08-26 | Discharge: 2016-08-26 | Disposition: A | Discharge status: Home / Self Care | Payer: Self-pay | Attending: Emergency Medicine | Admitting: Emergency Medicine

## 2016-08-26 DIAGNOSIS — J9801 Acute bronchospasm: Secondary | ICD-10-CM

## 2016-08-26 DIAGNOSIS — J069 Acute upper respiratory infection, unspecified: Secondary | ICD-10-CM

## 2016-08-26 LAB — BASIC METABOLIC PANEL
Anion gap: 7 (ref 5–15)
BUN: 14 mg/dL (ref 6–20)
CHLORIDE: 103 mmol/L (ref 101–111)
CO2: 27 mmol/L (ref 22–32)
CREATININE: 0.76 mg/dL (ref 0.44–1.00)
Calcium: 9.1 mg/dL (ref 8.9–10.3)
GFR calc Af Amer: >60 mL/min (ref >60)
GFR calc non Af Amer: >60 mL/min (ref >60)
Glucose, Bld: 93 mg/dL (ref 65–99)
Potassium: 4.2 mmol/L (ref 3.5–5.1)
Sodium: 137 mmol/L (ref 135–145)

## 2016-08-26 LAB — CBC
HCT: 38.2 % (ref 36.0–46.0)
Hemoglobin: 12.7 g/dL (ref 12.0–15.0)
MCH: 27.8 pg (ref 26.0–34.0)
MCHC: 33.2 g/dL (ref 30.0–36.0)
MCV: 83.6 fL (ref 78.0–100.0)
Platelets: 219 10*3/uL (ref 150–400)
RBC: 4.57 MIL/uL (ref 3.87–5.11)
RDW: 13.9 % (ref 11.5–15.5)
WBC: 8.4 10*3/uL (ref 4.0–10.5)

## 2016-08-26 MED ORDER — ONDANSETRON 4 MG PO TBDP
8.0000 mg | ORAL_TABLET | Freq: Once | ORAL | Status: AC
  Administered 2016-08-26: 8 mg via ORAL
  Filled 2016-08-26: qty 2

## 2016-08-26 MED ORDER — FLUCONAZOLE 100 MG PO TABS
150.0000 mg | ORAL_TABLET | Freq: Once | ORAL | Status: AC
  Administered 2016-08-26: 150 mg via ORAL
  Filled 2016-08-26: qty 2

## 2016-08-26 MED ORDER — ALBUTEROL SULFATE HFA 108 (90 BASE) MCG/ACT IN AERS
2.0000 | INHALATION_SPRAY | RESPIRATORY_TRACT | Status: DC
  Administered 2016-08-26: 2 via RESPIRATORY_TRACT
  Filled 2016-08-26: qty 6.7

## 2016-08-26 NOTE — ED Provider Notes (Signed)
[facility] DEPT Provider Note   CSN: [id number] Arrival date & time: 08/25/16  2244 By signing my name below, I, Doran Stabler, attest that this documentation has been prepared under the direction and in the presence of Jola Schmidt, MD. Electronically Signed: Doran Stabler, ED Scribe. 08/26/16. 1:10 AM.  History   Chief Complaint Chief Complaint  Patient presents with  . Chest Pain  . Fatigue   The history is provided by the patient. No language interpreter was used.   HPI Comments: Haley Wyatt is a 40 y.o. female who presents to the Emergency Department complaining of ongoing, constant fatigue for the past 1 week. She states she had flu-like symptoms last week for which she took "left over" amoxicillin for. Since then, she feels like she has no energy and feels "winded" when she has converses. She also reports associated cough, HA, blurry vision, dizziness, and nausea. Pt denies any SOB with exertion, urinary symptoms, CP, vomiting, diarrhea, or any other symptoms at this time.   Past Medical History:  Diagnosis Date  . Allergy   . Back pain    "muscle strain from exercising"  . Borderline diabetes    states "levels have been about 102"  . Fibroid tumor    inside uterus  . GERD (gastroesophageal reflux disease)    PT STATES "DOES NOT HAVE IT NOW"  . H/O hiatal hernia   . Hypertension    NO MEDS.. BP CONTROLLED   Patient Active Problem List   Diagnosis Date Noted  . Fibroids, intramural 01/28/2015  . Menorrhagia 01/28/2015  . Dysmenorrhea 01/28/2015  . Fibroid 01/28/2015  . 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 Headache 07/16/2013  . Encounter for routine gynecological examination 07/16/2013  . Morbid obesity ([place]) 07/16/2013  . Pre-diabetes 01/31/2013  . Left shoulder pain 10/28/2011  . 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 HYPERTENSION 08/16/2010  .  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 LEG EDEMA, 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  . GERD 06/11/2010  . HYPOTHYROIDISM 04/16/2009  . OBESITY 04/16/2009  . DEPRESSION 03/30/2009   Past Surgical History:  Procedure Laterality Date  . ABDOMINAL HYSTERECTOMY    . BILATERAL SALPINGECTOMY Bilateral 01/28/2015   Procedure: BILATERAL SALPINGECTOMY;  Surgeon: Eldred Manges, MD;  Location: [facility];  Service: Gynecology;  Laterality: Bilateral;  . CARPAL TUNNEL RELEASE    . DILITATION & CURRETTAGE/HYSTROSCOPY WITH HYDROTHERMAL ABLATION N/A 04/16/2014   Procedure: DILATATION & CURETTAGE/HYSTEROSCOPY WITH HYDROTHERMAL ABLATION;  Surgeon: Frederico Hamman, MD;  Location: [facility];  Service: Gynecology;  Laterality: N/A;  . HIATAL HERNIA REPAIR N/A 02/11/2014   Procedure: LAPAROSCOPIC REPAIR OF HIATAL HERNIA;  Surgeon: Pedro Earls, MD;  [facility];  Service: General;  Laterality: N/A;  . LAPAROSCOPIC GASTRIC SLEEVE RESECTION N/A 02/11/2014   Procedure: LAPAROSCOPIC GASTRIC SLEEVE RESECTION;  Surgeon: Pedro Earls, MD;  [facility];  Service: General;  Laterality: N/A;  . SHOULDER SURGERY     left shoulder loose body removal  . SUPRACERVICAL ABDOMINAL HYSTERECTOMY Bilateral 01/28/2015   Procedure: TOTAL SUPER CERVICAL ABDOMINAL HYSTERECTOMY BILATERAL SALPINGECTOMY;  Surgeon: Eldred Manges, MD;  Location: [facility];  Service: Gynecology;  Laterality: Bilateral;  . TUBAL LIGATION    . UPPER GI ENDOSCOPY  02/11/2014   Procedure: UPPER GI ENDOSCOPY;  Surgeon: Pedro Earls, MD;  [facility];  Service: General;;   OB History    Gravida Para Term Preterm AB Living  4         4   SAB TAB Ectopic Multiple Live Births                 Home Medications    Prior to Admission medications   Medication Sig Start Date End Date Taking? Authorizing Provider  cyclobenzaprine (FLEXERIL) 10 MG tablet Take 1 tablet (10 mg total) by mouth 2 (two) times daily as needed for muscle  spasms. Patient not taking: Reported on 08/26/2016 12/29/14   Fransico Meadow, PA-C  HYDROcodone-acetaminophen (NORCO/VICODIN) 5-325 MG tablet Take 2 tablets by mouth every 4 (four) hours as needed. Patient not taking: Reported on 08/26/2016 03/15/16   Konrad Felix, PA  ketorolac (TORADOL) 10 MG tablet 1  po  pc every 6 hours x 3 days then prn-pain Patient not taking: Reported on 08/26/2016 01/30/15   Earnstine Regal, PA-C  ondansetron (ZOFRAN) 4 MG tablet Take 1 tablet (4 mg total) by mouth every 6 (six) hours as needed for nausea. Patient not taking: Reported on 08/26/2016 01/30/15   Earnstine Regal, PA-C  oxyCODONE-acetaminophen (PERCOCET/ROXICET) 5-325 MG per tablet Take 1-2 tablets by mouth every 4 (four) hours as needed for moderate pain. Patient not taking: Reported on 08/26/2016 01/30/15   Earnstine Regal, PA-C   Family History Family History  Problem Relation Age of Onset  . Hyperlipidemia Father   . Hypertension Father   . Diabetes Father   . Cancer Mother   . Sudden death Neg Hx   . Heart attack Neg Hx    Social History Social History  Substance Use Topics  . Smoking status: Former Smoker    Types: Cigarettes    Quit date: 02/05/2006  . Smokeless tobacco: Never Used  . Alcohol use No   Allergies   Review of patient's allergies indicates no known allergies.  Review of Systems Review of Systems A complete 10 system review of systems was obtained and all systems are negative except as noted in the HPI and PMH.   Physical Exam Updated Vital Signs BP 106/85   Pulse 70   Temp 98.1 F (36.7 C) (Oral)   Resp 22   Ht 5\' 10"  (1.778 m)   Wt 289 lb (131.1 kg)   LMP 04/28/2014   SpO2 97%   BMI 41.47 kg/m   Physical Exam  Constitutional: She is oriented to person, place, and time. She appears well-developed and well-nourished. No distress.  HENT:  Head: Normocephalic and atraumatic.  Eyes: EOM are normal.  Neck: Normal range of motion.  Cardiovascular: Normal rate, regular rhythm  and normal heart sounds.   Pulmonary/Chest: Effort normal and breath sounds normal.  Abdominal: Soft. She exhibits no distension. There is no tenderness.  Musculoskeletal: Normal range of motion.  Neurological: She is alert and oriented to person, place, and time.  Skin: Skin is warm and dry.  Psychiatric: She has a normal mood and affect. Judgment normal.  Nursing note and vitals reviewed.  ED Treatments / Results  DIAGNOSTIC STUDIES: Oxygen Saturation is 100% on room air, normal by my interpretation.    COORDINATION OF CARE: 1:10 AM Discussed treatment plan with pt at bedside and pt agreed to plan.  Labs (all labs ordered are listed, but only abnormal results are displayed) Labs Reviewed  [place], ED   EKG  EKG Interpretation  Date/Time:  Thursday August 25 2016 23:12:08 EDT Ventricular Rate:  67 PR Interval:  140 QRS Duration: 86 QT  Interval:  410 QTC Calculation: 433 R Axis:   62 Text Interpretation:  Normal sinus rhythm Normal ECG When compared with ECG of 04/15/2014, No significant change was found Confirmed by [facility]  MD, DAVID ([id number]) on 08/25/2016 11:15:18 PM      Radiology Dg Chest 2 View  Result Date: 08/25/2016 CLINICAL DATA:  Dyspnea and chest pain for 3 days. EXAM: CHEST  2 VIEW COMPARISON:  03/06/2013 FINDINGS: The lungs are clear. The pulmonary vasculature is normal. Heart size is normal. Hilar and mediastinal contours are unremarkable. There is no pleural effusion. IMPRESSION: No active cardiopulmonary disease. Electronically Signed   By: Andreas Newport M.D.   On: 08/25/2016 23:39   Procedures Procedures (including critical care time)  Medications Ordered in ED Medications  albuterol (PROVENTIL HFA;VENTOLIN HFA) 108 (90 Base) MCG/ACT inhaler 2 puff (2 puffs Inhalation Given 08/26/16 0134)  ondansetron (ZOFRAN-ODT) disintegrating tablet 8 mg (8 mg Oral Given 08/26/16 0130)  fluconazole (DIFLUCAN) tablet 150 mg (150  mg Oral Given 08/26/16 0130)    Initial Impression / Assessment and Plan / ED Course  I have reviewed the triage vital signs and the nursing notes.  Pertinent labs & imaging results that were available during my care of the patient were reviewed by me and considered in my medical decision making (see chart for details).  Clinical Course    2:44 AM Pt is well appearing. Feels better after bronchodilators. Likely upper respiratory tract infection with associated bronchospasm.  No dictation for additional workup.  Primary care follow-up.  She understands to return to the ER for new or worsening symptoms  Final Clinical Impressions(s) / ED Diagnoses   Final diagnoses:  Bronchospasm  Viral URI    New Prescriptions New Prescriptions   No medications on file   I personally performed the services described in this documentation, which was scribed in my presence. The recorded information has been reviewed and is accurate.      Jola Schmidt, MD 08/26/16 [phone]

## 2016-08-29 ENCOUNTER — Ambulatory Visit: Payer: BLUE CROSS/BLUE SHIELD

## 2016-08-31 ENCOUNTER — Ambulatory Visit: Payer: BLUE CROSS/BLUE SHIELD

## 2016-09-13 ENCOUNTER — Encounter (HOSPITAL_COMMUNITY): Payer: Self-pay
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
## 2017-01-10 ENCOUNTER — Ambulatory Visit
Admission: RE | Admit: 2017-01-10 | Discharge: 2017-01-10 | Disposition: A | Discharge status: Home / Self Care | Payer: BLUE CROSS/BLUE SHIELD | Source: Ambulatory Visit | Attending: Family Medicine | Admitting: Family Medicine

## 2017-01-10 ENCOUNTER — Other Ambulatory Visit: Payer: Self-pay | Admitting: Family Medicine

## 2017-01-10 DIAGNOSIS — M533 Sacrococcygeal disorders, not elsewhere classified: Principal | ICD-10-CM

## 2017-01-10 DIAGNOSIS — G8929 Other chronic pain: Secondary | ICD-10-CM

## 2017-02-20 ENCOUNTER — Emergency Department (HOSPITAL_BASED_OUTPATIENT_CLINIC_OR_DEPARTMENT_OTHER): Admit: 2017-02-20 | Discharge: 2017-02-20 | Disposition: A | Discharge status: Home / Self Care | Payer: Self-pay

## 2017-02-20 ENCOUNTER — Emergency Department (HOSPITAL_COMMUNITY)
Admission: EM | Admit: 2017-02-20 | Discharge: 2017-02-20 | Disposition: A | Discharge status: Home / Self Care | Payer: Self-pay | Attending: Emergency Medicine | Admitting: Emergency Medicine

## 2017-02-20 ENCOUNTER — Encounter (HOSPITAL_COMMUNITY): Payer: Self-pay | Admitting: Nurse Practitioner

## 2017-02-20 DIAGNOSIS — Y990 Civilian activity done for income or pay: Secondary | ICD-10-CM | POA: Insufficient documentation

## 2017-02-20 DIAGNOSIS — Z79899 Other long term (current) drug therapy: Secondary | ICD-10-CM | POA: Insufficient documentation

## 2017-02-20 DIAGNOSIS — M79609 Pain in unspecified limb: Secondary | ICD-10-CM

## 2017-02-20 DIAGNOSIS — E039 Hypothyroidism, unspecified: Secondary | ICD-10-CM | POA: Insufficient documentation

## 2017-02-20 DIAGNOSIS — M62830 Muscle spasm of back: Secondary | ICD-10-CM | POA: Insufficient documentation

## 2017-02-20 DIAGNOSIS — Y9289 Other specified places as the place of occurrence of the external cause: Secondary | ICD-10-CM | POA: Insufficient documentation

## 2017-02-20 DIAGNOSIS — Z87891 Personal history of nicotine dependence: Secondary | ICD-10-CM | POA: Insufficient documentation

## 2017-02-20 DIAGNOSIS — Y9389 Activity, other specified: Secondary | ICD-10-CM | POA: Insufficient documentation

## 2017-02-20 DIAGNOSIS — S39012A Strain of muscle, fascia and tendon of lower back, initial encounter: Secondary | ICD-10-CM | POA: Insufficient documentation

## 2017-02-20 DIAGNOSIS — M62838 Other muscle spasm: Secondary | ICD-10-CM

## 2017-02-20 DIAGNOSIS — X509XXA Other and unspecified overexertion or strenuous movements or postures, initial encounter: Secondary | ICD-10-CM | POA: Insufficient documentation

## 2017-02-20 DIAGNOSIS — I10 Essential (primary) hypertension: Secondary | ICD-10-CM | POA: Insufficient documentation

## 2017-02-20 LAB — URINALYSIS, ROUTINE W REFLEX MICROSCOPIC
Bilirubin Urine: NEGATIVE
Glucose, UA: NEGATIVE mg/dL
Hgb urine dipstick: NEGATIVE
Ketones, ur: NEGATIVE mg/dL
Leukocytes, UA: NEGATIVE
Nitrite: NEGATIVE
PH: 5 (ref 5.0–8.0)
Protein, ur: NEGATIVE mg/dL
SPECIFIC GRAVITY, URINE: 1.018 (ref 1.005–1.030)

## 2017-02-20 MED ORDER — METHOCARBAMOL 500 MG PO TABS
500.0000 mg | ORAL_TABLET | Freq: Once | ORAL | Status: AC
  Administered 2017-02-20: 500 mg via ORAL
  Filled 2017-02-20: qty 1

## 2017-02-20 MED ORDER — TRAMADOL HCL 50 MG PO TABS: 50.0000 mg | ORAL_TABLET | Freq: Four times a day (QID) | ORAL | 0 refills | Status: DC | PRN

## 2017-02-20 MED ORDER — METHOCARBAMOL 500 MG PO TABS: 500.0000 mg | ORAL_TABLET | Freq: Three times a day (TID) | ORAL | 0 refills | Status: DC | PRN

## 2017-02-20 NOTE — ED Triage Notes (Signed)
Pt presents with c/o back pain and L leg pain. The pain is in her mid back, onset today while sitting in her desk at work. She describes as intermittent muscle spasms. She denies recent heavy lifting, exertional activities. She denies history of back pain. She tried ibuprofen and tylenol with some improvement. The L leg pain began about 2 weeks ago. She reports the pain is behind her knee and feels like a knot. The pain gets worse with ambulation. She has noticed some swelling to the leg. She tried to rest the leg with no improvement

## 2017-02-20 NOTE — ED Provider Notes (Signed)
[facility] DEPT Provider Note   CSN: [id number] Arrival date & time: 02/20/17  1550  By signing my name below, I, Levester Fresh, attest that this documentation has been prepared under the direction and in the presence of Davonna Belling, MD.  Electronically Signed: Levester Fresh, Scribe. 02/20/2017. 4:53 PM.  History   Chief Complaint Chief Complaint  Patient presents with  . Back Pain  . Leg Pain   Haley Wyatt is a 40 y.o. female with no significant PMHx who presents to the Emergency Department with complaints of left sided back pain 2/2 to intermittent spasms underneath her left shoulder blade x1 day. No recent change in activity. No hx of similar sx.   Pt additionally endorses increased urinary frequency and (unrelated) left knee pain, worse with leg extension. Pt describes a localized knot behind this knee. She would like to r/o blood clot.  Pt denies experiencing any other acute sx, including dysuria, hematuria, peripheral edema, chest pain or dyspnea.  The history is provided by the patient. No language interpreter was used.    Past Medical History:  Diagnosis Date  . Allergy   . Back pain    "muscle strain from exercising"  . Borderline diabetes    states "levels have been about 102"  . Fibroid tumor    inside uterus  . GERD (gastroesophageal reflux disease)    PT STATES "DOES NOT HAVE IT NOW"  . H/O hiatal hernia   . Hypertension    NO MEDS.. BP CONTROLLED    Patient Active Problem List   Diagnosis Date Noted  . Fibroids, intramural 01/28/2015  . Menorrhagia 01/28/2015  . Dysmenorrhea 01/28/2015  . Fibroid 01/28/2015  . 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 Headache 07/16/2013  . Encounter for routine gynecological examination 07/16/2013  . Morbid obesity ([place]) 07/16/2013  . Pre-diabetes 01/31/2013  . Left shoulder pain 10/28/2011  . 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 HYPERTENSION 08/16/2010  . 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 LEG EDEMA, 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  . GERD 06/11/2010  . HYPOTHYROIDISM 04/16/2009  . OBESITY 04/16/2009  . DEPRESSION 03/30/2009    Past Surgical History:  Procedure Laterality Date  . ABDOMINAL HYSTERECTOMY    . BILATERAL SALPINGECTOMY Bilateral 01/28/2015   Procedure: BILATERAL SALPINGECTOMY;  Surgeon: Eldred Manges, MD;  Location: [facility];  Service: Gynecology;  Laterality: Bilateral;  . CARPAL TUNNEL RELEASE    . DILITATION & CURRETTAGE/HYSTROSCOPY WITH HYDROTHERMAL ABLATION N/A 04/16/2014   Procedure: DILATATION & CURETTAGE/HYSTEROSCOPY WITH HYDROTHERMAL ABLATION;  Surgeon: Frederico Hamman, MD;  Location: [facility];  Service: Gynecology;  Laterality: N/A;  . HIATAL HERNIA REPAIR N/A 02/11/2014   Procedure: LAPAROSCOPIC REPAIR OF HIATAL HERNIA;  Surgeon: Pedro Earls, MD;  Location: [facility];  Service: General;  Laterality: N/A;  . LAPAROSCOPIC GASTRIC SLEEVE RESECTION N/A 02/11/2014   Procedure: LAPAROSCOPIC GASTRIC SLEEVE RESECTION;  Surgeon: Pedro Earls, MD;  Location: [facility];  Service: General;  Laterality: N/A;  . SHOULDER SURGERY     left shoulder loose body removal  . SUPRACERVICAL ABDOMINAL HYSTERECTOMY Bilateral 01/28/2015   Procedure: TOTAL SUPER CERVICAL ABDOMINAL HYSTERECTOMY BILATERAL SALPINGECTOMY;  Surgeon: Eldred Manges, MD;  Location: [facility];  Service: Gynecology;  Laterality: Bilateral;  . TUBAL LIGATION    . UPPER GI ENDOSCOPY  02/11/2014   Procedure: UPPER GI ENDOSCOPY;  Surgeon: Pedro Earls, MD;  Location: [facility];  Service:  General;;    OB History    Gravida Para Term Preterm AB Living   4         4   SAB TAB Ectopic Multiple Live Births                   Home Medications    Prior to Admission medications   Medication Sig Start Date End Date Taking? Authorizing Provider  cyclobenzaprine (FLEXERIL) 10 MG tablet Take 1 tablet (10 mg total) by  mouth 2 (two) times daily as needed for muscle spasms. Patient not taking: Reported on 08/26/2016 12/29/14   Fransico Meadow, PA-C  HYDROcodone-acetaminophen (NORCO/VICODIN) 5-325 MG tablet Take 2 tablets by mouth every 4 (four) hours as needed. Patient not taking: Reported on 08/26/2016 03/15/16   Konrad Felix, PA  ketorolac (TORADOL) 10 MG tablet 1  po  pc every 6 hours x 3 days then prn-pain Patient not taking: Reported on 08/26/2016 01/30/15   Earnstine Regal, PA-C  methocarbamol (ROBAXIN) 500 MG tablet Take 1 tablet (500 mg total) by mouth every 8 (eight) hours as needed for muscle spasms. 02/20/17   Davonna Belling, MD  ondansetron (ZOFRAN) 4 MG tablet Take 1 tablet (4 mg total) by mouth every 6 (six) hours as needed for nausea. Patient not taking: Reported on 08/26/2016 01/30/15   Earnstine Regal, PA-C  oxyCODONE-acetaminophen (PERCOCET/ROXICET) 5-325 MG per tablet Take 1-2 tablets by mouth every 4 (four) hours as needed for moderate pain. Patient not taking: Reported on 08/26/2016 01/30/15   Earnstine Regal, PA-C  traMADol (ULTRAM) 50 MG tablet Take 1 tablet (50 mg total) by mouth every 6 (six) hours as needed. 02/20/17   Davonna Belling, MD    Family History Family History  Problem Relation Age of Onset  . Hyperlipidemia Father   . Hypertension Father   . Diabetes Father   . Cancer Mother   . Sudden death Neg Hx   . Heart attack Neg Hx     Social History Social History  Substance Use Topics  . Smoking status: Former Smoker    Types: Cigarettes    Quit date: 02/05/2006  . Smokeless tobacco: Never Used  . Alcohol use No     Allergies   Patient has no known allergies.   Review of Systems Review of Systems  Respiratory: Negative for shortness of breath.   Cardiovascular: Negative for chest pain and leg swelling.  Genitourinary: Positive for urgency. Negative for dysuria and hematuria.  Musculoskeletal: Positive for arthralgias, back pain and myalgias.    Physical Exam Updated Vital  Signs BP (!) 111/98 (BP Location: Right Arm)   Pulse 66   Temp 98.4 F (36.9 C) (Oral)   Resp 19   LMP 04/28/2014   SpO2 100%   Physical Exam  Constitutional: She is oriented to person, place, and time. She appears well-developed and well-nourished. No distress.  HENT:  Head: Normocephalic and atraumatic.  Eyes: Conjunctivae are normal.  Cardiovascular: Normal rate.   Pulmonary/Chest: Effort normal and breath sounds normal.  Lungs CTA.  Abdominal: There is no tenderness.  Musculoskeletal:  CVA tenderness on the left side. Tenderness down the left lateral back musculature. Some tenderness posterior to the left knee.Good ROM. No peripheral edema. No pain with valgus or varus strain on the knee. Left foot is neuromuscularly intact.   Neurological: She is alert and oriented to person, place, and time.  Skin: Skin is warm and dry.  Psychiatric: She has a normal mood and  affect.  Nursing note and vitals reviewed.  ED Treatments / Results  DIAGNOSTIC STUDIES:   COORDINATION OF CARE: 4:33 PM Discussed treatment plan with pt at bedside and pt agreed to plan.  Labs (all labs ordered are listed, but only abnormal results are displayed) Labs Reviewed  URINALYSIS, ROUTINE W REFLEX MICROSCOPIC - Abnormal; Notable for the following:       Result Value   APPearance HAZY (*)    All other components within normal limits    EKG  EKG Interpretation None       Radiology No results found.  Procedures Procedures (including critical care time)  Medications Ordered in ED Medications  methocarbamol (ROBAXIN) tablet 500 mg (500 mg Oral Given 02/20/17 1700)     Initial Impression / Assessment and Plan / ED Course  I have reviewed the triage vital signs and the nursing notes.  Pertinent labs & imaging results that were available during my care of the patient were reviewed by me and considered in my medical decision making (see chart for details).     Patient with pain in her left  flank. Worse with movement. Had some dysuria but urinalysis showed.Negative Doppler since pain also went down behind her knee. Likely musculoskeletal. Discharge home.  Final Clinical Impressions(s) / ED Diagnoses   Final diagnoses:  Strain of lumbar region, initial encounter  Muscle spasm    New Prescriptions Discharge Medication List as of 02/20/2017  6:49 PM    START taking these medications   Details  methocarbamol (ROBAXIN) 500 MG tablet Take 1 tablet (500 mg total) by mouth every 8 (eight) hours as needed for muscle spasms., Starting Mon 02/20/2017, Print    traMADol (ULTRAM) 50 MG tablet Take 1 tablet (50 mg total) by mouth every 6 (six) hours as needed., Starting Mon 02/20/2017, Print       I personally performed the services described in this documentation, which was scribed in my presence. The recorded information has been reviewed and is accurate.      Davonna Belling, MD 02/20/17 2036

## 2017-02-20 NOTE — Progress Notes (Signed)
*  Preliminary Results* Left lower extremity venous duplex completed. Left lower extremity is negative for deep vein thrombosis. There is no evidence of left Baker's cyst.  02/20/2017 6:24 PM  Maudry Mayhew, BS, RVT, RDCS, RDMS

## 2017-02-20 NOTE — ED Notes (Signed)
Pt transported to vascular.  °

## 2017-06-09 ENCOUNTER — Ambulatory Visit (INDEPENDENT_AMBULATORY_CARE_PROVIDER_SITE_OTHER): Payer: BLUE CROSS/BLUE SHIELD | Admitting: Physician Assistant

## 2017-06-09 ENCOUNTER — Encounter: Payer: Self-pay | Admitting: Physician Assistant

## 2017-06-09 VITALS — BP 117/86 | HR 90 | Temp 98.0°F | Resp 18 | Ht 68.11 in | Wt 323.4 lb

## 2017-06-09 DIAGNOSIS — R42 Dizziness and giddiness: Secondary | ICD-10-CM

## 2017-06-09 DIAGNOSIS — R0981 Nasal congestion: Secondary | ICD-10-CM

## 2017-06-09 DIAGNOSIS — H9203 Otalgia, bilateral: Secondary | ICD-10-CM | POA: Diagnosis not present

## 2017-06-09 MED ORDER — LORATADINE-PSEUDOEPHEDRINE ER 10-240 MG PO TB24: 1.0000 | ORAL_TABLET | Freq: Every day | ORAL | 1 refills | Status: DC

## 2017-06-09 MED ORDER — FLUTICASONE PROPIONATE 50 MCG/ACT NA SUSP: 2.0000 | Freq: Every day | NASAL | 6 refills | Status: DC

## 2017-06-09 NOTE — Progress Notes (Signed)
Haley [facility]att  MRN: [id number] DOB: [birth year]  PCP: Vernie Shanks, MD  Subjective:  Pt is a 41 year old female who presents to clinic for ear pain x 3 days. Feels like she is underwater. C/o feeling off balance.  No h/o seasonal allergies. She has been taking Mucinex x 3 days - feels worse. Denies runny nose, sneezing, cough, sinus pressure, fever, chills, ringing in her ears, ear drainage, pain on the outside of her ears, visual changes.   Review of Systems  Constitutional: Negative for chills, fatigue and fever.  HENT: Positive for ear pain. Negative for congestion, dental problem, ear discharge, hearing loss, postnasal drip, rhinorrhea, sinus pain, sinus pressure, tinnitus and trouble swallowing.   Gastrointestinal: Negative for nausea and vomiting.  Psychiatric/Behavioral: Negative for sleep disturbance.    Patient Active Problem List   Diagnosis Date Noted  . Fibroids, intramural 01/28/2015  . Menorrhagia 01/28/2015  . Dysmenorrhea 01/28/2015  . Fibroid 01/28/2015  . 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 Headache 07/16/2013  . Encounter for routine gynecological examination 07/16/2013  . Morbid obesity ([place]) 07/16/2013  . Pre-diabetes 01/31/2013  . Left shoulder pain 10/28/2011  . 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 HYPERTENSION 08/16/2010  . 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 LEG EDEMA, 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  . GERD 06/11/2010  . HYPOTHYROIDISM 04/16/2009  . OBESITY 04/16/2009  . DEPRESSION 03/30/2009    Current Outpatient Prescriptions on File Prior to Visit  Medication Sig Dispense Refill  . cyclobenzaprine (FLEXERIL) 10 MG tablet Take 1 tablet (10 mg total) by mouth 2 (two) times daily as needed for muscle spasms. (Patient not taking: Reported on 06/09/2017) 20 tablet 0  . HYDROcodone-acetaminophen (NORCO/VICODIN) 5-325 MG tablet Take 2 tablets by  mouth every 4 (four) hours as needed. (Patient not taking: Reported on 06/09/2017) 6 tablet 0  . ketorolac (TORADOL) 10 MG tablet 1  po  pc every 6 hours x 3 days then prn-pain (Patient not taking: Reported on 06/09/2017) 20 tablet 0  . methocarbamol (ROBAXIN) 500 MG tablet Take 1 tablet (500 mg total) by mouth every 8 (eight) hours as needed for muscle spasms. (Patient not taking: Reported on 06/09/2017) 10 tablet 0  . ondansetron (ZOFRAN) 4 MG tablet Take 1 tablet (4 mg total) by mouth every 6 (six) hours as needed for nausea. (Patient not taking: Reported on 06/09/2017) 20 tablet 0  . oxyCODONE-acetaminophen (PERCOCET/ROXICET) 5-325 MG per tablet Take 1-2 tablets by mouth every 4 (four) hours as needed for moderate pain. (Patient not taking: Reported on 06/09/2017) 45 tablet 0  . traMADol (ULTRAM) 50 MG tablet Take 1 tablet (50 mg total) by mouth every 6 (six) hours as needed. (Patient not taking: Reported on 06/09/2017) 8 tablet 0   No current facility-administered medications on file prior to visit.     No Known Allergies   Objective:  BP 117/86 (BP Location: Right Arm, Patient Position: Sitting, Cuff Size: Large)   Pulse 90   Temp 98 F (36.7 C) (Oral)   Resp 18   Ht 5' 8.11" (1.73 m)   Wt (!) 323 lb 6.4 oz (146.7 kg)   LMP 04/28/2014   SpO2 98%   BMI 49.01 kg/m   Physical Exam  Constitutional: She is oriented to person, place, and time and well-developed, well-nourished, and in no distress. No distress.  HENT:  Right Ear: Tympanic membrane normal. No drainage or tenderness. No mastoid  tenderness. No decreased hearing is noted.  Left Ear: Tympanic membrane normal. No drainage or tenderness. No mastoid tenderness. No decreased hearing is noted.  Mouth/Throat: Oropharynx is clear and moist and mucous membranes are normal.  Cardiovascular: Normal rate, regular rhythm and normal heart sounds.   Neurological: She is alert and oriented to person, place, and time. GCS score is 15.  Skin:  Skin is warm and dry.  Psychiatric: Mood, memory, affect and judgment normal.  Vitals reviewed.   Assessment and Plan :  1. Otalgia of both ears 2. Head congestion 3. Dizziness - fluticasone (FLONASE) 50 MCG/ACT nasal spray; Place 2 sprays into both nostrils daily.  Dispense: 16 g; Refill: 6 - loratadine-pseudoephedrine (CLARITIN-D 24-HOUR) 10-240 MG 24 hr tablet; Take 1 tablet by mouth daily.  Dispense: 30 tablet; Refill: 1 - Suspect possible eustachian tube dysfunction. Plan to treat with pseudoephedrine and flonase. RTC in 5-7 days if no improvement. She agrees with plan.   Mercer Pod, PA-C  [facility] 06/09/2017 3:48 PM

## 2017-06-09 NOTE — Patient Instructions (Addendum)
Flonase: 2 sprays each nostril at night before bed and once in the morning. May repeat if needed.  Loratadine-pseudoephedrine: take one pill daily x 7 days.  Stay well hydrated. Drink 32-64 oz water daily.  Cut back on coffee and dairy x 1 week.  Drink hot tea with honey and lemon.  Come back if you are not improving in 5-7 days.   Thank you for coming in today. I hope you feel we met your needs.  Feel free to call PCP if you have any questions or further requests.  Please consider signing up for MyChart if you do not already have it, as this is a great way to communicate with me.  Best,  [employer], PA-C

## 2017-08-03 ENCOUNTER — Ambulatory Visit: Payer: BLUE CROSS/BLUE SHIELD | Admitting: Family Medicine

## 2017-09-06 ENCOUNTER — Ambulatory Visit: Payer: Self-pay | Admitting: Emergency Medicine

## 2017-09-13 ENCOUNTER — Encounter (HOSPITAL_COMMUNITY): Payer: Self-pay

## 2017-10-19 ENCOUNTER — Telehealth (HOSPITAL_COMMUNITY): Payer: Self-pay

## 2017-10-19 NOTE — Telephone Encounter (Signed)
This patient is overdue for recommended follow-up with a bariatric surgeon at [facility]. A letter was mailed to the address on file on 11.21.18 from both [place] in attempt to reestablish post-op care. The letter included a patient survey which was returned to the [facility] this week.  Patient reports that she now has insurance coverage again for bariatric follow-up & would like to schedule an appointment with Dr. Hassell Done at [facility]. Copy of the survey was shared with Mallory Shirk at [facility] so she may file in patients office chart as well as contact the patient directly for reviewing current insurance benefits and scheduling an appointment in one of their offices.

## 2017-12-20 ENCOUNTER — Emergency Department (HOSPITAL_COMMUNITY)
Admission: EM | Admit: 2017-12-20 | Discharge: 2017-12-21 | Disposition: A | Discharge status: Home / Self Care | Payer: BLUE CROSS/BLUE SHIELD | Attending: Emergency Medicine | Admitting: Emergency Medicine

## 2017-12-20 ENCOUNTER — Other Ambulatory Visit: Payer: Self-pay

## 2017-12-20 ENCOUNTER — Emergency Department (HOSPITAL_COMMUNITY): Payer: BLUE CROSS/BLUE SHIELD

## 2017-12-20 DIAGNOSIS — R079 Chest pain, unspecified: Secondary | ICD-10-CM | POA: Diagnosis present

## 2017-12-20 DIAGNOSIS — I10 Essential (primary) hypertension: Secondary | ICD-10-CM | POA: Diagnosis not present

## 2017-12-20 DIAGNOSIS — Z79899 Other long term (current) drug therapy: Secondary | ICD-10-CM | POA: Insufficient documentation

## 2017-12-20 DIAGNOSIS — E039 Hypothyroidism, unspecified: Secondary | ICD-10-CM | POA: Insufficient documentation

## 2017-12-20 DIAGNOSIS — R0789 Other chest pain: Secondary | ICD-10-CM | POA: Diagnosis not present

## 2017-12-20 LAB — CBC
HCT: 39.8 % (ref 36.0–46.0)
Hemoglobin: 13 g/dL (ref 12.0–15.0)
MCH: 27.3 pg (ref 26.0–34.0)
MCHC: 32.7 g/dL (ref 30.0–36.0)
MCV: 83.4 fL (ref 78.0–100.0)
PLATELETS: 202 10*3/uL (ref 150–400)
RBC: 4.77 MIL/uL (ref 3.87–5.11)
RDW: 14.1 % (ref 11.5–15.5)
WBC: 10.3 10*3/uL (ref 4.0–10.5)

## 2017-12-20 LAB — BASIC METABOLIC PANEL
Anion gap: 9 (ref 5–15)
BUN: 12 mg/dL (ref 6–20)
CO2: 26 mmol/L (ref 22–32)
CREATININE: 0.86 mg/dL (ref 0.44–1.00)
Calcium: 9.1 mg/dL (ref 8.9–10.3)
Chloride: 102 mmol/L (ref 101–111)
GFR calc Af Amer: >60 mL/min (ref >60)
GLUCOSE: 110 mg/dL — AB (ref 65–99)
Potassium: 4 mmol/L (ref 3.5–5.1)
Sodium: 137 mmol/L (ref 135–145)

## 2017-12-20 LAB — I-STAT TROPONIN, ED
Troponin i, poc: 0 ng/mL (ref 0.00–0.08)
Troponin i, poc: 0 ng/mL (ref 0.00–0.08)

## 2017-12-20 LAB — D-DIMER, QUANTITATIVE (NOT AT ARMC): D DIMER QUANT: 0.45 ug{FEU}/mL (ref 0.00–0.50)

## 2017-12-20 MED ORDER — OXYCODONE-ACETAMINOPHEN 5-325 MG PO TABS
1.0000 | ORAL_TABLET | Freq: Once | ORAL | Status: AC
  Administered 2017-12-20: 1 via ORAL
  Filled 2017-12-20: qty 1

## 2017-12-20 NOTE — ED Provider Notes (Signed)
[place] EMERGENCY DEPARTMENT Provider Note   CSN: [id number] Arrival date & time: 12/20/17  1816     History   Chief Complaint Chief Complaint  Patient presents with  . Chest Pain    HPI Haley Wyatt is a 42 y.o. female.  The history is provided by the patient and medical records.  Chest Pain      42 y.o. F with hx of back pain, borderline DM, uterine fibroids, GERD, HTN, obesity, presenting to the ED for chest pain.  States this began earlier this morning while she was at work in her office.  States it started as central chest pressure with some intermittent, sharp, stabbing sensations in the left chest area that would last just a second and resolve.  States she took a gas pill, a bayer aspirin, and drank a ginger ale in case it was gas but there was no change.  States pressure has been constant all day.  Denies SOB but states when she yawns or takes a deep breath it hurts her.  She denies diaphoresis, nausea, vomiting, dizziness, weakness.  She has no known cardiac history.  She is a former smoker.  No hx of DVT or PE-- states she felt like she may have had one a few months ago but had [place] of her leg that was negative.  Grandmother does have cardiac history as well as clotting history but no issues with mother/father.   No cough, fever, or other URI symptoms.  Past Medical History:  Diagnosis Date  . Allergy   . Back pain    "muscle strain from exercising"  . Borderline diabetes    states "levels have been about 102"  . Fibroid tumor    inside uterus  . GERD (gastroesophageal reflux disease)    PT STATES "DOES NOT HAVE IT NOW"  . H/O hiatal hernia   . Hypertension    NO MEDS.. BP CONTROLLED    Patient Active Problem List   Diagnosis Date Noted  . Dysmenorrhea 01/28/2015  . Fibroid 01/28/2015  . 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 Morbid obesity ([place]) 07/16/2013  . HYPERTENSION 08/16/2010  . 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 LEG EDEMA, 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  . GERD 06/11/2010  . HYPOTHYROIDISM 04/16/2009  . OBESITY 04/16/2009  . DEPRESSION 03/30/2009    Past Surgical History:  Procedure Laterality Date  . ABDOMINAL HYSTERECTOMY    . BILATERAL SALPINGECTOMY Bilateral 01/28/2015   Procedure: BILATERAL SALPINGECTOMY;  Surgeon: Eldred Manges, MD;  Location: [facility];  Service: Gynecology;  Laterality: Bilateral;  . CARPAL TUNNEL RELEASE    . DILITATION & CURRETTAGE/HYSTROSCOPY WITH HYDROTHERMAL ABLATION N/A 04/16/2014   Procedure: DILATATION & CURETTAGE/HYSTEROSCOPY WITH HYDROTHERMAL ABLATION;  Surgeon: Frederico Hamman, MD;  Location: [facility];  Service: Gynecology;  Laterality: N/A;  . HIATAL HERNIA REPAIR N/A 02/11/2014   Procedure: LAPAROSCOPIC REPAIR OF HIATAL HERNIA;  Surgeon: Pedro Earls, MD;  Location: [facility];  Service: General;  Laterality: N/A;  . LAPAROSCOPIC GASTRIC SLEEVE RESECTION N/A 02/11/2014   Procedure: LAPAROSCOPIC GASTRIC SLEEVE RESECTION;  Surgeon: Pedro Earls, MD;  Location: [facility];  Service: General;  Laterality: N/A;  . SHOULDER SURGERY     left shoulder loose body removal  . SUPRACERVICAL ABDOMINAL HYSTERECTOMY Bilateral 01/28/2015   Procedure: TOTAL SUPER CERVICAL ABDOMINAL HYSTERECTOMY BILATERAL SALPINGECTOMY;  Surgeon: Eldred Manges, MD;  Location: [facility];  Service: Gynecology;  Laterality: Bilateral;  . TUBAL LIGATION    . UPPER  GI ENDOSCOPY  02/11/2014   Procedure: UPPER GI ENDOSCOPY;  Surgeon: Pedro Earls, MD;  Location: [facility];  Service: General;;    OB History    Gravida Para Term Preterm AB Living   4         4   SAB TAB Ectopic Multiple Live Births                   Home Medications    Prior to Admission medications   Medication Sig Start Date End Date Taking? Authorizing Provider  fluticasone (FLONASE) 50 MCG/ACT nasal spray Place 2 sprays into both nostrils daily. 06/09/17   McVey, Gelene Mink, PA-C  loratadine-pseudoephedrine  (CLARITIN-D 24-HOUR) 10-240 MG 24 hr tablet Take 1 tablet by mouth daily. 06/09/17   McVey, Gelene Mink, PA-C    Family History Family History  Problem Relation Age of Onset  . Hyperlipidemia Father   . Hypertension Father   . Diabetes Father   . Cancer Mother   . Sudden death Neg Hx   . Heart attack Neg Hx     Social History Social History   Tobacco Use  . Smoking status: Former Smoker    Types: Cigarettes    Last attempt to quit: 02/05/2006    Years since quitting: 11.8  . Smokeless tobacco: Never Used  Substance Use Topics  . Alcohol use: Yes    Comment: 1 year  . Drug use: No     Allergies   Patient has no known allergies.   Review of Systems Review of Systems  Cardiovascular: Positive for chest pain.  All other systems reviewed and are negative.    Physical Exam Updated Vital Signs BP 107/69 (BP Location: Right Arm)   Pulse 79   Temp 99.3 F (37.4 C) (Oral)   Resp 16   Ht 5\' 8"  (1.727 m)   Wt (!) 146.5 kg (323 lb)   LMP 04/28/2014   SpO2 100%   BMI 49.11 kg/m   Physical Exam  Constitutional: She is oriented to person, place, and time. She appears well-developed and well-nourished.  HENT:  Head: Normocephalic and atraumatic.  Mouth/Throat: Oropharynx is clear and moist.  Eyes: Conjunctivae and EOM are normal. Pupils are equal, round, and reactive to light.  Neck: Normal range of motion.  Cardiovascular: Normal rate, regular rhythm and normal heart sounds.  Pulmonary/Chest: Effort normal and breath sounds normal.  Abdominal: Soft. Bowel sounds are normal.  Musculoskeletal: Normal range of motion.  Neurological: She is alert and oriented to person, place, and time.  Skin: Skin is warm and dry.  Psychiatric: She has a normal mood and affect.  Nursing note and vitals reviewed.    ED Treatments / Results  Labs (all labs ordered are listed, but only abnormal results are displayed) Labs Reviewed  BASIC METABOLIC PANEL - Abnormal; Notable  for the following components:      Result Value   Glucose, Bld 110 (*)    All other components within normal limits  CBC  D-DIMER, QUANTITATIVE (NOT AT [facility])  I-STAT TROPONIN, ED  I-STAT TROPONIN, ED    EKG  EKG Interpretation  Date/Time:  Wednesday December 20 2017 18:21:43 EST Ventricular Rate:  82 PR Interval:  136 QRS Duration: 80 QT Interval:  368 QTC Calculation: 429 R Axis:   57 Text Interpretation:  Normal sinus rhythm Normal ECG When compared with ECG of 08/25/2016, No significant change was found Confirmed by Delora Fuel ([id number]) on 12/20/2017 10:54:07 PM  Radiology Dg Chest 2 View  Result Date: 12/20/2017 CLINICAL DATA:  Chest pain EXAM: CHEST  2 VIEW COMPARISON:  08/25/2016 FINDINGS: The heart size and mediastinal contours are within normal limits. Both lungs are clear. The visualized skeletal structures are unremarkable. IMPRESSION: No active cardiopulmonary disease. Electronically Signed   By: Donavan Foil M.D.   On: 12/20/2017 19:33    Procedures Procedures (including critical care time)  Medications Ordered in ED Medications  oxyCODONE-acetaminophen (PERCOCET/ROXICET) 5-325 MG per tablet 1 tablet (1 tablet Oral Given 12/20/17 2310)     Initial Impression / Assessment and Plan / ED Course  I have reviewed the triage vital signs and the nursing notes.  Pertinent labs & imaging results that were available during my care of the patient were reviewed by me and considered in my medical decision making (see chart for details).  42 year old female presenting to the ED with left upper chest pain.  States it began centrally as pressure with intermittent sharp, stabbing sensations in the left chest.  This is been ongoing since early this morning.  She is afebrile and nontoxic.  Chest wall is nontender.  Heart sounds are normal, lungs are clear.  EKG and initial screening labs along with chest x-ray are normal.  Does report some pleuritic type pain, worse with deep  breathing.  D-dimer was added which is negative.  Patient also had delta troponin which remains negative.  Pain may be musculoskeletal in nature.  Feel ACS, PE, dissection, acute cardiac event is less likely given several hours of symptoms and negative evaluation here.  Feel she is stable for discharge.  Will have her follow-up closely with her PCP.  Discussed plan with patient, he/she acknowledged understanding and agreed with plan of care.  Return precautions given for new or worsening symptoms.  Final Clinical Impressions(s) / ED Diagnoses   Final diagnoses:  Atypical chest pain    ED Discharge Orders        Ordered    naproxen (NAPROSYN) 500 MG tablet  2 times daily with meals     12/21/17 0101       Larene Pickett, PA-C 12/21/17 [place], [place], DO 12/21/17 1610

## 2017-12-20 NOTE — ED Notes (Signed)
Results reviewed, no change in acuity at this time 

## 2017-12-20 NOTE — ED Triage Notes (Signed)
States chest pain started today. Began as feeling like someone was sitting on her chest; now radiating to the back of left shoulder blade  Took one chewable asprin approx hr ago

## 2017-12-21 MED ORDER — NAPROXEN 500 MG PO TABS: 500.0000 mg | ORAL_TABLET | Freq: Two times a day (BID) | ORAL | 0 refills | Status: DC

## 2017-12-21 NOTE — Discharge Instructions (Signed)
Take the prescribed medication as directed. Follow-up with your primary care doctor.  If you do not want to continue seeing your physician, you can see a new doctor from the wellness clinic.  You can also call the number on your paperwork and they can help you find a doctor in the area. Return to the ED for new or worsening symptoms.

## 2017-12-21 NOTE — ED Notes (Signed)
PT states understanding of care given, follow up care, and medication prescribed. PT ambulated from ED to car with a steady gait. 

## 2018-05-08 ENCOUNTER — Encounter (HOSPITAL_COMMUNITY): Payer: Self-pay | Admitting: Emergency Medicine

## 2018-05-08 ENCOUNTER — Ambulatory Visit (HOSPITAL_COMMUNITY)
Admission: EM | Admit: 2018-05-08 | Discharge: 2018-05-08 | Disposition: A | Discharge status: Home / Self Care | Payer: BLUE CROSS/BLUE SHIELD | Attending: Family Medicine | Admitting: Family Medicine

## 2018-05-08 DIAGNOSIS — M7661 Achilles tendinitis, right leg: Secondary | ICD-10-CM

## 2018-05-08 MED ORDER — MELOXICAM 7.5 MG PO TABS: 7.5000 mg | ORAL_TABLET | Freq: Every day | ORAL | 0 refills | Status: DC

## 2018-05-08 NOTE — ED Triage Notes (Signed)
PT reports right heel pain that is most severe in the morning. PT was using the treadmill more than usual when it started.

## 2018-05-08 NOTE — Discharge Instructions (Signed)
Start Mobic. Do not take ibuprofen (motrin/advil)/ naproxen (aleve) while on mobic.  Ice compress, elevation, ankle stretches. Rest your heel for now, and you can slowly increase your workout regimen again after symptoms resolved.  Follow-up here with PCP if symptoms not improving.

## 2018-05-08 NOTE — ED Provider Notes (Signed)
Haley Wyatt    CSN: [id number] Arrival date & time: 05/08/18  1729     History   Chief Complaint Chief Complaint  Patient presents with  . Foot Pain    HPI Haley Wyatt is a 42 y.o. female.   42 year old female comes in with 3-week history of right heel pain that is worse in the morning.  States no obvious injury/trauma, but has recently hired a Physiological scientist, and has been working out aggressively.  States now with right posterior heel pain, severe in the morning, more mild throughout the day, but always present.  States flexing ankle makes the pain worse.  She has been doing ice compress, intermittent ibuprofen/naproxen, topical medication without relief.  Denies numbness, tingling.  Denies swelling.     Past Medical History:  Diagnosis Date  . Allergy   . Back pain    "muscle strain from exercising"  . Borderline diabetes    states "levels have been about 102"  . Fibroid tumor    inside uterus  . GERD (gastroesophageal reflux disease)    PT STATES "DOES NOT HAVE IT NOW"  . H/O hiatal hernia   . Hypertension    NO MEDS.. BP CONTROLLED    Patient Active Problem List   Diagnosis Date Noted  . Dysmenorrhea 01/28/2015  . Fibroid 01/28/2015  . 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 Morbid obesity ([place]) 07/16/2013  . HYPERTENSION 08/16/2010  . 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 LEG EDEMA, 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  . GERD 06/11/2010  . HYPOTHYROIDISM 04/16/2009  . OBESITY 04/16/2009  . DEPRESSION 03/30/2009    Past Surgical History:  Procedure Laterality Date  . ABDOMINAL HYSTERECTOMY    . BILATERAL SALPINGECTOMY Bilateral 01/28/2015   Procedure: BILATERAL SALPINGECTOMY;  Surgeon: Eldred Manges, MD;  Location: [facility];  Service: Gynecology;  Laterality: Bilateral;  . CARPAL TUNNEL RELEASE    . DILITATION & CURRETTAGE/HYSTROSCOPY WITH HYDROTHERMAL ABLATION N/A 04/16/2014   Procedure: DILATATION & CURETTAGE/HYSTEROSCOPY WITH  HYDROTHERMAL ABLATION;  Surgeon: Frederico Hamman, MD;  Location: [facility];  Service: Gynecology;  Laterality: N/A;  . HIATAL HERNIA REPAIR N/A 02/11/2014   Procedure: LAPAROSCOPIC REPAIR OF HIATAL HERNIA;  Surgeon: Pedro Earls, MD;  Location: [facility];  Service: General;  Laterality: N/A;  . LAPAROSCOPIC GASTRIC SLEEVE RESECTION N/A 02/11/2014   Procedure: LAPAROSCOPIC GASTRIC SLEEVE RESECTION;  Surgeon: Pedro Earls, MD;  Location: [facility];  Service: General;  Laterality: N/A;  . SHOULDER SURGERY     left shoulder loose body removal  . SUPRACERVICAL ABDOMINAL HYSTERECTOMY Bilateral 01/28/2015   Procedure: TOTAL SUPER CERVICAL ABDOMINAL HYSTERECTOMY BILATERAL SALPINGECTOMY;  Surgeon: Eldred Manges, MD;  Location: [facility];  Service: Gynecology;  Laterality: Bilateral;  . TUBAL LIGATION    . UPPER GI ENDOSCOPY  02/11/2014   Procedure: UPPER GI ENDOSCOPY;  Surgeon: Pedro Earls, MD;  Location: [facility];  Service: General;;    OB History    Gravida  4   Para      Term      Preterm      AB      Living  4     SAB      TAB      Ectopic      Multiple      Live Births               Home Medications    Prior to Admission medications   Medication Sig Start Date End  Date Taking? Authorizing Provider  meloxicam (MOBIC) 7.5 MG tablet Take 1 tablet (7.5 mg total) by mouth daily. 05/08/18   Ok Edwards, PA-C    Family History Family History  Problem Relation Age of Onset  . Hyperlipidemia Father   . Hypertension Father   . Diabetes Father   . Cancer Mother   . Sudden death Neg Hx   . Heart attack Neg Hx     Social History Social History   Tobacco Use  . Smoking status: Former Smoker    Types: Cigarettes    Last attempt to quit: 02/05/2006    Years since quitting: 12.2  . Smokeless tobacco: Never Used  Substance Use Topics  . Alcohol use: Yes    Comment: 1 year  . Drug use: No     Allergies   Patient has no known allergies.   Review of  Systems Review of Systems  Reason unable to perform ROS: See HPI as above.     Physical Exam Triage Vital Signs ED Triage Vitals  Enc Vitals Group     BP 05/08/18 1741 (!) 159/102     Pulse Rate 05/08/18 1741 98     Resp 05/08/18 1741 18     Temp 05/08/18 1741 98.2 F (36.8 C)     Temp Source 05/08/18 1741 Oral     SpO2 05/08/18 1741 99 %     Weight --      Height --      Head Circumference --      Peak Flow --      Pain Score 05/08/18 1745 7     Pain Loc --      Pain Edu? --      Excl. in [place]? --    No data found.  Updated Vital Signs BP (!) 159/102 (BP Location: Right Arm)   Pulse 98   Temp 98.2 F (36.8 C) (Oral)   Resp 18   LMP 04/28/2014   SpO2 99%   Physical Exam  Constitutional: She is oriented to person, place, and time. She appears well-developed and well-nourished. No distress.  HENT:  Head: Normocephalic and atraumatic.  Eyes: Pupils are equal, round, and reactive to light. Conjunctivae are normal.  Musculoskeletal:  No obvious swelling, erythema, increased warmth, contusion.  Tenderness to palpation of posterior right heel, around Achilles tendon attachment.  No tenderness to palpation of rest of ankle, no tenderness to palpation of foot.  Patient deferred flexion of the ankle.  Was able to extend, invert and evert ankle.  Full range of motion of toes.  Strength normal and equal bilaterally.  Sensation intact ankle bilaterally.  Pedal pulse 2+ and equal bilaterally.  Neurological: She is alert and oriented to person, place, and time.  Skin: She is not diaphoretic.     UC Treatments / Results  Labs (all labs ordered are listed, but only abnormal results are displayed) Labs Reviewed - No data to display  EKG None  Radiology No results found.  Procedures Procedures (including critical care time)  Medications Ordered in UC Medications - No data to display  Initial Impression / Assessment and Plan / UC Course  I have reviewed the triage vital  signs and the nursing notes.  Pertinent labs & imaging results that were available during my care of the patient were reviewed by me and considered in my medical decision making (see chart for details).    Exam consistent with Achilles tendinitis.  Start patient on Mobic, ice  compress, elevation, ankle exercise, resting.  Patient can slowly increase workout regimen after symptoms improves.  Return precautions given.  Final Clinical Impressions(s) / UC Diagnoses   Final diagnoses:  Achilles tendinitis of right lower extremity    ED Prescriptions    Medication Sig Dispense Auth. Provider   meloxicam (MOBIC) 7.5 MG tablet Take 1 tablet (7.5 mg total) by mouth daily. 15 tablet Tobin Chad, [place] 05/08/18 1824

## 2018-10-24 ENCOUNTER — Emergency Department (HOSPITAL_BASED_OUTPATIENT_CLINIC_OR_DEPARTMENT_OTHER)
Admission: EM | Admit: 2018-10-24 | Discharge: 2018-10-25 | Disposition: A | Discharge status: Home / Self Care | Payer: BLUE CROSS/BLUE SHIELD | Attending: Emergency Medicine | Admitting: Emergency Medicine

## 2018-10-24 ENCOUNTER — Encounter (HOSPITAL_BASED_OUTPATIENT_CLINIC_OR_DEPARTMENT_OTHER): Payer: Self-pay

## 2018-10-24 ENCOUNTER — Other Ambulatory Visit: Payer: Self-pay

## 2018-10-24 DIAGNOSIS — M25551 Pain in right hip: Secondary | ICD-10-CM

## 2018-10-24 DIAGNOSIS — I10 Essential (primary) hypertension: Secondary | ICD-10-CM | POA: Insufficient documentation

## 2018-10-24 DIAGNOSIS — M1611 Unilateral primary osteoarthritis, right hip: Secondary | ICD-10-CM | POA: Diagnosis not present

## 2018-10-24 DIAGNOSIS — Z87891 Personal history of nicotine dependence: Secondary | ICD-10-CM | POA: Insufficient documentation

## 2018-10-24 DIAGNOSIS — Z79899 Other long term (current) drug therapy: Secondary | ICD-10-CM | POA: Insufficient documentation

## 2018-10-24 MED ORDER — TRAMADOL HCL 50 MG PO TABS: 50.0000 mg | ORAL_TABLET | Freq: Four times a day (QID) | ORAL | 0 refills | Status: DC | PRN

## 2018-10-24 MED ORDER — IBUPROFEN 800 MG PO TABS: 800.0000 mg | ORAL_TABLET | Freq: Three times a day (TID) | ORAL | 0 refills | Status: DC

## 2018-10-24 MED ORDER — KETOROLAC TROMETHAMINE 60 MG/2ML IM SOLN
60.0000 mg | Freq: Once | INTRAMUSCULAR | Status: AC
  Administered 2018-10-24: 60 mg via INTRAMUSCULAR
  Filled 2018-10-24: qty 2

## 2018-10-24 MED ORDER — CYCLOBENZAPRINE HCL 10 MG PO TABS: 10.0000 mg | ORAL_TABLET | Freq: Two times a day (BID) | ORAL | 0 refills | Status: DC | PRN

## 2018-10-24 NOTE — ED Notes (Signed)
Patient was able to ambulate a short distance but expressed increased pain with ambulation.

## 2018-10-24 NOTE — ED Notes (Signed)
ED Provider at bedside. 

## 2018-10-24 NOTE — Discharge Instructions (Signed)
Please take medications prescribed for pain.  Try ice pack, icyhot and epsom salt bath for comfort.  Follow up with orthopedist as needed for further care.  Return if you have any concerns.

## 2018-10-24 NOTE — ED Provider Notes (Signed)
[facility]ER[facility]ENC[facility] DEPARTMENT Provider Note   CSN: [id number] Arrival date & time: 10/24/18  2042     History   Chief Complaint Chief Complaint  Patient presents with  . Hip Pain    HPI Haley Wyatt is a 43 y.o. female.  The history is provided by the patient. No language interpreter was used.  Hip Pain  Pertinent negatives include no abdominal pain.     43 year old obese patient presenting with complaints of right hip pain.  Patient report for the past 3 days she has had pain to her right hip.  She described pain as a sharp sensation, presents in the morning when she started to move, intense, nonradiating, improves over time especially after she walks around.  However, after she sat down for p.o. time and when she stood up, the pain returns.  Pain initially did improved with taking anti-inflammatory medication however throughout the day today, her pain has intensified.  Pain now not adequately controlled with Biofreeze, and ibuprofen at home.  She does not complain of fever, abdominal pain, bowel bladder incontinence, saddle anesthesia.  She does complain of some lower back pain that she had in the past which is unrelated to this pain.  She admits that she has been very busy for the past few days and have been moving around more than usual but she mention her symptoms got worse when she rests today.  She states she missed work.  No document history of IV drug use active cancer.  Patient is currently not pregnant.  Past Medical History:  Diagnosis Date  . Allergy   . Back pain    "muscle strain from exercising"  . Borderline diabetes    states "levels have been about 102"  . Fibroid tumor    inside uterus  . GERD (gastroesophageal reflux disease)    PT STATES "DOES NOT HAVE IT NOW"  . H/O hiatal hernia   . Hypertension    NO MEDS.. BP CONTROLLED    Patient Active Problem List   Diagnosis Date Noted  . Dysmenorrhea 01/28/2015  . Fibroid 01/28/2015  . 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 Morbid obesity ([facility]) 07/16/2013  . HYPERTENSION 08/16/2010  . 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 LEG EDEMA, 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  . GERD 06/11/2010  . HYPOTHYROIDISM 04/16/2009  . OBESITY 04/16/2009  . DEPRESSION 03/30/2009    Past Surgical History:  Procedure Laterality Date  . ABDOMINAL HYSTERECTOMY    . BILATERAL SALPINGECTOMY Bilateral 01/28/2015   Procedure: BILATERAL SALPINGECTOMY;  Surgeon: Eldred Manges, MD;  Location: [facility];  Service: Gynecology;  Laterality: Bilateral;  . CARPAL TUNNEL RELEASE    . DILITATION & CURRETTAGE/HYSTROSCOPY WITH HYDROTHERMAL ABLATION N/A 04/16/2014   Procedure: DILATATION & CURETTAGE/HYSTEROSCOPY WITH HYDROTHERMAL ABLATION;  Surgeon: Frederico Hamman, MD;  Location: [facility];  Service: Gynecology;  Laterality: N/A;  . HIATAL HERNIA REPAIR N/A 02/11/2014   Procedure: LAPAROSCOPIC REPAIR OF HIATAL HERNIA;  Surgeon: Pedro Earls, MD;  Location: [facility];  Service: General;  Laterality: N/A;  . LAPAROSCOPIC GASTRIC SLEEVE RESECTION N/A 02/11/2014   Procedure: LAPAROSCOPIC GASTRIC SLEEVE RESECTION;  Surgeon: Pedro Earls, MD;  Location: [facility];  Service: General;  Laterality: N/A;  . SHOULDER SURGERY     left shoulder loose body removal  . SUPRACERVICAL ABDOMINAL HYSTERECTOMY Bilateral 01/28/2015   Procedure: TOTAL SUPER CERVICAL ABDOMINAL HYSTERECTOMY BILATERAL SALPINGECTOMY;  Surgeon: Eldred Manges, MD;  Location: [facility];  Service: Gynecology;  Laterality: Bilateral;  .  TUBAL LIGATION    . UPPER GI ENDOSCOPY  02/11/2014   Procedure: UPPER GI ENDOSCOPY;  Surgeon: Pedro Earls, MD;  Location: [facility];  Service: General;;     OB History    Gravida  4   Para      Term      Preterm      AB      Living  4     SAB      TAB      Ectopic      Multiple      Live Births               Home Medications    Prior to Admission medications   Medication Sig Start Date  End Date Taking? Authorizing Provider  meloxicam (MOBIC) 7.5 MG tablet Take 1 tablet (7.5 mg total) by mouth daily. 05/08/18   Ok Edwards, PA-C    Family History Family History  Problem Relation Age of Onset  . Hyperlipidemia Father   . Hypertension Father   . Diabetes Father   . Cancer Mother   . Sudden death Neg Hx   . Heart attack Neg Hx     Social History Social History   Tobacco Use  . Smoking status: Former Smoker    Types: Cigarettes    Last attempt to quit: 02/05/2006    Years since quitting: 12.7  . Smokeless tobacco: Never Used  Substance Use Topics  . Alcohol use: Yes    Comment: occ  . Drug use: No     Allergies   Patient has no known allergies.   Review of Systems Review of Systems  Constitutional: Negative for fever.  Gastrointestinal: Negative for abdominal pain.  Genitourinary: Negative for dysuria.  Musculoskeletal: Positive for back pain.  Skin: Negative for wound.  Neurological: Negative for numbness.  All other systems reviewed and are negative.    Physical Exam Updated Vital Signs BP 124/81   Pulse 100   Temp 98.8 F (37.1 C) (Oral)   Resp 18   Ht 5\' 10"  (1.778 m)   Wt (!) 146.5 kg   LMP 04/28/2014   SpO2 99%   BMI 46.35 kg/m   Physical Exam Vitals signs and nursing note reviewed.  Constitutional:      General: She is not in acute distress.    Appearance: She is well-developed. She is obese.     Comments: Morbidly obese female nontoxic in appearance.  HENT:     Head: Atraumatic.  Eyes:     Conjunctiva/sclera: Conjunctivae normal.  Neck:     Musculoskeletal: Neck supple.  Abdominal:     Palpations: Abdomen is soft.     Tenderness: There is no abdominal tenderness.  Musculoskeletal:        General: Tenderness (Left leg: Pain with both passive and active range of motion without limited range of motion.  Poor effort.  Intact pedal pulse, patellar deep tendon reflex intact.  No deformity no overlying skin changes.) present.      Comments: Tenderness to right lumbosacral region on palpation.  No significant midline spine tenderness crepitus or step-off.  Skin:    Findings: No rash.  Neurological:     Mental Status: She is alert.      ED Treatments / Results  Labs (all labs ordered are listed, but only abnormal results are displayed) Labs Reviewed - No data to display  EKG None  Radiology No results found.  Procedures Procedures (including critical  care time)  Medications Ordered in ED Medications  ketorolac (TORADOL) injection 60 mg (60 mg Intramuscular Given 10/24/18 2307)     Initial Impression / Assessment and Plan / ED Course  I have reviewed the triage vital signs and the nursing notes.  Pertinent labs & imaging results that were available during my care of the patient were reviewed by me and considered in my medical decision making (see chart for details).     BP 124/81   Pulse 100   Temp 98.8 F (37.1 C) (Oral)   Resp 18   Ht 5\' 10"  (1.778 m)   Wt (!) 146.5 kg   LMP 04/28/2014   SpO2 99%   BMI 46.35 kg/m    Final Clinical Impressions(s) / ED Diagnoses   Final diagnoses:  Right hip pain  Osteoarthritis of right hip, unspecified osteoarthritis type    ED Discharge Orders         Ordered    traMADol (ULTRAM) 50 MG tablet  Every 6 hours PRN     10/24/18 2345    ibuprofen (ADVIL,MOTRIN) 800 MG tablet  3 times daily     10/24/18 2345    cyclobenzaprine (FLEXERIL) 10 MG tablet  2 times daily PRN     10/24/18 2345         11:24 PM Patient here with right hip pain at the joint.  Pain is recent producible on exam.  No significant injury however patient does endorse increase activities for the past few days which I suspect aggravate her pain.  Suspect osteoarthritis causing her symptom.  I have low suspicion for cord compression, septic joint, or other acute emergent medical condition.  Will d/c with RICE therapy, including Ibuprofen, Flexeril and lidoderm patches.  Return  precaution discussed.  Orthopedic referral given as needed. Pt able to walk with difficulty.    Domenic Moras, PA-C 10/24/18 2349    Blanchie Dessert, MD 10/25/18 2151

## 2018-10-24 NOTE — ED Triage Notes (Signed)
C/o right hip pain x 3 days-no denies injury-pt NAD-slow limping gait

## 2018-11-05 ENCOUNTER — Ambulatory Visit (INDEPENDENT_AMBULATORY_CARE_PROVIDER_SITE_OTHER): Payer: BLUE CROSS/BLUE SHIELD | Admitting: Orthopaedic Surgery

## 2018-11-05 ENCOUNTER — Ambulatory Visit (INDEPENDENT_AMBULATORY_CARE_PROVIDER_SITE_OTHER): Payer: Self-pay

## 2018-11-05 ENCOUNTER — Encounter (INDEPENDENT_AMBULATORY_CARE_PROVIDER_SITE_OTHER): Payer: Self-pay | Admitting: Orthopaedic Surgery

## 2018-11-05 DIAGNOSIS — M25551 Pain in right hip: Secondary | ICD-10-CM | POA: Diagnosis not present

## 2018-11-05 DIAGNOSIS — M5431 Sciatica, right side: Secondary | ICD-10-CM

## 2018-11-05 MED ORDER — METHYLPREDNISOLONE 4 MG PO TABS: ORAL_TABLET | ORAL | 0 refills | Status: DC

## 2018-11-05 NOTE — Progress Notes (Signed)
Office Visit Note   Patient: Haley Wyatt           Date of Birth: 04-03-1976           MRN: [id number] Visit Date: 11/05/2018              Requested by: No referring provider defined for this encounter. PCP: Patient, No Pcp Per   Assessment & Plan: Visit Diagnoses:  1. Pain in right hip   2. Sciatica, right side     Plan: I do feel a lot of her problems are related to her weight.  Her right hip exam and x-rays are entirely normal.  I do feel this is more of a lumbar spine issue.  She is had previous x-rays showing degenerative disc disease of the lumbar spine.  At this point I started on a 6-day steroid taper and obtain an MRI of the lumbar spine to rule out any nerve compression.  All question concerns were answered and addressed.  We will see her back after the MRI of her lumbar spine.  Follow-Up Instructions: Return in about 2 years (around 11/05/2020).   Orders:  Orders Placed This Encounter  Procedures  . XR HIP UNILAT W OR W/O PELVIS 1V RIGHT   Meds ordered this encounter  Medications  . methylPREDNISolone (MEDROL) 4 MG tablet    Sig: Medrol dose pack. Take as instructed    Dispense:  21 tablet    Refill:  0      Procedures: No procedures performed   Clinical Data: No additional findings.   Subjective: Chief Complaint  Patient presents with  . Right Hip - Pain  The patient is someone I am seeing for the first time.  She is referred from the emergency room due to severe right hip pain.  She describes pain in the groin.  She has had lumbar spine issues as well.  She is someone with a BMI of likely over 40 based on a weight of 323 pounds.  She reports pain that goes down the back of her knee all the way to her foot.  She describes numbness and tingling as well.  She is not a diabetic.  She said the pain is become debilitating and she cannot get around enough so she went to the emergency room.  HPI  Review of Systems She currently denies any headache, chest  pain, shortness of breath, fever, chills, nausea, vomiting.  Objective: Vital Signs: LMP 04/28/2014   Physical Exam She is alert and orient x3 and in no acute distress Ortho Exam Examination of her right hip shows that moves fluidly with no difficulty at all.  There is no pain in the groin with motion of her hip. Specialty Comments:  No specialty comments available.  Imaging: Xr Hip Unilat W Or W/o Pelvis 1v Right  Result Date: 11/05/2018 An AP pelvis and lateral right hip shows no acute findings at all.  The joint space is well-maintained and there is no significant arthritis.    PMFS History: Patient Active Problem List   Diagnosis Date Noted  . Dysmenorrhea 01/28/2015  . Fibroid 01/28/2015  . 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 Morbid obesity ([facility]) 07/16/2013  . HYPERTENSION 08/16/2010  . 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 LEG EDEMA, 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  . GERD 06/11/2010  . HYPOTHYROIDISM 04/16/2009  . OBESITY 04/16/2009  . DEPRESSION 03/30/2009   Past Medical History:  Diagnosis Date  . Allergy   . Back pain    "  muscle strain from exercising"  . Borderline diabetes    states "levels have been about 102"  . Fibroid tumor    inside uterus  . GERD (gastroesophageal reflux disease)    PT STATES "DOES NOT HAVE IT NOW"  . H/O hiatal hernia   . Hypertension    NO MEDS.. BP CONTROLLED    Family History  Problem Relation Age of Onset  . Hyperlipidemia Father   . Hypertension Father   . Diabetes Father   . Cancer Mother   . Sudden death Neg Hx   . Heart attack Neg Hx     Past Surgical History:  Procedure Laterality Date  . ABDOMINAL HYSTERECTOMY    . BILATERAL SALPINGECTOMY Bilateral 01/28/2015   Procedure: BILATERAL SALPINGECTOMY;  Surgeon: Eldred Manges, MD;  Location: [facility];  Service: Gynecology;  Laterality: Bilateral;  . CARPAL TUNNEL RELEASE    . DILITATION & CURRETTAGE/HYSTROSCOPY WITH HYDROTHERMAL ABLATION N/A 04/16/2014    Procedure: DILATATION & CURETTAGE/HYSTEROSCOPY WITH HYDROTHERMAL ABLATION;  Surgeon: Frederico Hamman, MD;  Location: [facility];  Service: Gynecology;  Laterality: N/A;  . HIATAL HERNIA REPAIR N/A 02/11/2014   Procedure: LAPAROSCOPIC REPAIR OF HIATAL HERNIA;  Surgeon: Pedro Earls, MD;  Location: [facility];  Service: General;  Laterality: N/A;  . LAPAROSCOPIC GASTRIC SLEEVE RESECTION N/A 02/11/2014   Procedure: LAPAROSCOPIC GASTRIC SLEEVE RESECTION;  Surgeon: Pedro Earls, MD;  Location: [facility];  Service: General;  Laterality: N/A;  . SHOULDER SURGERY     left shoulder loose body removal  . SUPRACERVICAL ABDOMINAL HYSTERECTOMY Bilateral 01/28/2015   Procedure: TOTAL SUPER CERVICAL ABDOMINAL HYSTERECTOMY BILATERAL SALPINGECTOMY;  Surgeon: Eldred Manges, MD;  Location: [facility];  Service: Gynecology;  Laterality: Bilateral;  . TUBAL LIGATION    . UPPER GI ENDOSCOPY  02/11/2014   Procedure: UPPER GI ENDOSCOPY;  Surgeon: Pedro Earls, MD;  Location: [facility];  Service: General;;   Social History   Occupational History  . 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 Smoking status: Former Smoker    Types: Cigarettes    Last attempt to quit: 02/05/2006    Years since quitting: 12.7  . Smokeless tobacco: Never Used  Substance and Sexual Activity  . Alcohol use: Yes    Comment: occ  . Drug use: No  . Sexual activity: Not on file

## 2018-11-06 ENCOUNTER — Encounter (INDEPENDENT_AMBULATORY_CARE_PROVIDER_SITE_OTHER): Payer: Self-pay

## 2018-11-06 ENCOUNTER — Other Ambulatory Visit (INDEPENDENT_AMBULATORY_CARE_PROVIDER_SITE_OTHER): Payer: Self-pay

## 2018-11-06 DIAGNOSIS — M4807 Spinal stenosis, lumbosacral region: Secondary | ICD-10-CM

## 2018-11-09 ENCOUNTER — Telehealth (INDEPENDENT_AMBULATORY_CARE_PROVIDER_SITE_OTHER): Payer: Self-pay | Admitting: Orthopaedic Surgery

## 2018-11-09 NOTE — Telephone Encounter (Signed)
Back to you looks like sent to [facility].

## 2018-11-09 NOTE — Telephone Encounter (Signed)
Pt called saying she is in extreme pain and steroid pack she received In the office it isn't helping this is her 3rd day taking it.   Advised pt only one provider in the office. Advised her if she was in that much pain she could go to the ED or urgent care, pt was extremely mad and hung up with the advice that was given.

## 2018-11-09 NOTE — Telephone Encounter (Signed)
Nothing we can really do.  She needs a MRI of her lumbar spine to rule out a herniated disc.

## 2018-11-13 ENCOUNTER — Ambulatory Visit (INDEPENDENT_AMBULATORY_CARE_PROVIDER_SITE_OTHER): Payer: Self-pay | Admitting: Bariatrics

## 2018-11-14 ENCOUNTER — Ambulatory Visit
Admission: RE | Admit: 2018-11-14 | Discharge: 2018-11-14 | Disposition: A | Discharge status: Home / Self Care | Payer: BLUE CROSS/BLUE SHIELD | Source: Ambulatory Visit | Attending: Orthopaedic Surgery | Admitting: Orthopaedic Surgery

## 2018-11-14 DIAGNOSIS — M4807 Spinal stenosis, lumbosacral region: Secondary | ICD-10-CM

## 2018-11-19 ENCOUNTER — Ambulatory Visit (INDEPENDENT_AMBULATORY_CARE_PROVIDER_SITE_OTHER): Payer: BLUE CROSS/BLUE SHIELD | Admitting: Orthopaedic Surgery

## 2018-11-19 ENCOUNTER — Encounter (INDEPENDENT_AMBULATORY_CARE_PROVIDER_SITE_OTHER): Payer: Self-pay | Admitting: Orthopaedic Surgery

## 2018-11-19 ENCOUNTER — Other Ambulatory Visit (INDEPENDENT_AMBULATORY_CARE_PROVIDER_SITE_OTHER): Payer: Self-pay

## 2018-11-19 DIAGNOSIS — M4807 Spinal stenosis, lumbosacral region: Secondary | ICD-10-CM

## 2018-11-19 DIAGNOSIS — M25551 Pain in right hip: Secondary | ICD-10-CM

## 2018-11-19 DIAGNOSIS — M5431 Sciatica, right side: Secondary | ICD-10-CM | POA: Diagnosis not present

## 2018-11-19 MED ORDER — HYDROCODONE-ACETAMINOPHEN 5-325 MG PO TABS: 1.0000 | ORAL_TABLET | Freq: Four times a day (QID) | ORAL | 0 refills | Status: DC | PRN

## 2018-11-19 NOTE — Progress Notes (Signed)
The patient is here today to go over an MRI of her lumbar spine.  She is a very pleasant 43 year old female with debilitating low back pain that is radiating her sciatic region going down her right leg.  She is usually sent to me thinking this was a hip issue but her hip exam and hip x-rays were normal.  Her pain seems to be more low back related.  Is been getting worse.  On exam today I can easily put her right hip through range of motion with no issues at all.  She has a positive straight leg raise noted to the right side and debilitating pain in her lumbar spine.  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  There is also a synovial cyst in this area as well that may be causing issues with the nerves in this area.  I recommended an epidural steroid injection of the right sided L4-L5 by Dr. Ernestina Patches and hopefully this can alleviate her symptoms.  She agrees with trying this route.  I also send in some hydrocodone to use sparingly.

## 2018-11-26 ENCOUNTER — Telehealth (INDEPENDENT_AMBULATORY_CARE_PROVIDER_SITE_OTHER): Payer: Self-pay | Admitting: *Deleted

## 2018-11-26 MED ORDER — TRAMADOL HCL 50 MG PO TABS: 50.0000 mg | ORAL_TABLET | Freq: Four times a day (QID) | ORAL | 0 refills | Status: DC | PRN

## 2018-11-26 NOTE — Telephone Encounter (Signed)
See below

## 2018-11-26 NOTE — Telephone Encounter (Signed)
I can only sen in Tramadol, which I did.

## 2018-11-27 ENCOUNTER — Ambulatory Visit (INDEPENDENT_AMBULATORY_CARE_PROVIDER_SITE_OTHER): Payer: Self-pay | Admitting: Bariatrics

## 2018-11-29 ENCOUNTER — Other Ambulatory Visit (INDEPENDENT_AMBULATORY_CARE_PROVIDER_SITE_OTHER): Payer: Self-pay

## 2018-11-29 DIAGNOSIS — M5431 Sciatica, right side: Secondary | ICD-10-CM

## 2018-11-29 DIAGNOSIS — M4807 Spinal stenosis, lumbosacral region: Secondary | ICD-10-CM

## 2018-11-29 NOTE — Telephone Encounter (Signed)
Patient aware  I have sent order to [facility] Also let her know she shouldn't need referral for chiropractor

## 2018-11-29 NOTE — Telephone Encounter (Signed)
Patient called back stating that GI needs an order sent to them to get an Epidural injection from them because they have an appointment available on Monday.  She was also checking on the referral to the Chiropractor (Dr. Olga Millers  [phone]).

## 2018-12-04 ENCOUNTER — Ambulatory Visit
Admission: RE | Admit: 2018-12-04 | Discharge: 2018-12-04 | Disposition: A | Discharge status: Home / Self Care | Payer: BLUE CROSS/BLUE SHIELD | Source: Ambulatory Visit | Attending: Orthopaedic Surgery | Admitting: Orthopaedic Surgery

## 2018-12-04 ENCOUNTER — Other Ambulatory Visit (INDEPENDENT_AMBULATORY_CARE_PROVIDER_SITE_OTHER): Payer: Self-pay | Admitting: Orthopaedic Surgery

## 2018-12-04 DIAGNOSIS — M5431 Sciatica, right side: Secondary | ICD-10-CM

## 2018-12-04 DIAGNOSIS — M4807 Spinal stenosis, lumbosacral region: Secondary | ICD-10-CM

## 2018-12-04 MED ORDER — METHYLPREDNISOLONE ACETATE 40 MG/ML INJ SUSP (RADIOLOG
120.0000 mg | Freq: Once | INTRAMUSCULAR | Status: AC
  Administered 2018-12-04: 120 mg via INTRA_ARTICULAR

## 2018-12-04 MED ORDER — IOPAMIDOL (ISOVUE-M 200) INJECTION 41%
1.0000 mL | Freq: Once | INTRAMUSCULAR | Status: AC
  Administered 2018-12-04: 1 mL via INTRA_ARTICULAR

## 2018-12-04 NOTE — Discharge Instructions (Signed)

## 2018-12-11 ENCOUNTER — Encounter (INDEPENDENT_AMBULATORY_CARE_PROVIDER_SITE_OTHER): Payer: Self-pay | Admitting: Physical Medicine and Rehabilitation

## 2018-12-11 ENCOUNTER — Telehealth (INDEPENDENT_AMBULATORY_CARE_PROVIDER_SITE_OTHER): Payer: Self-pay | Admitting: *Deleted

## 2018-12-11 NOTE — Telephone Encounter (Signed)
The only other thing we could do or recommend is referral for surgical evaluation.  It is only been 1 week since her injection so she may want to give this more time.  However certainly we could refer her to Dr. Louanne Skye for surgical evaluation.

## 2018-12-11 NOTE — Telephone Encounter (Signed)
See below

## 2018-12-12 MED ORDER — ACETAMINOPHEN-CODEINE #3 300-30 MG PO TABS: 1.0000 | ORAL_TABLET | Freq: Three times a day (TID) | ORAL | 0 refills | Status: DC | PRN

## 2018-12-12 MED ORDER — TIZANIDINE HCL 4 MG PO TABS: 4.0000 mg | ORAL_TABLET | Freq: Three times a day (TID) | ORAL | 0 refills | Status: DC | PRN

## 2018-12-12 NOTE — Telephone Encounter (Signed)
I made an appt with Haley Wyatt for her, she' not happy because it's  Mid March. She is asking for something stronger than Tramadol; she states Tramadol just isn't helping at all

## 2018-12-12 NOTE — Telephone Encounter (Signed)
I sent in some tylenol #3 and Zanaflex.

## 2019-01-03 ENCOUNTER — Encounter (INDEPENDENT_AMBULATORY_CARE_PROVIDER_SITE_OTHER): Payer: Self-pay | Admitting: Specialist

## 2019-01-03 ENCOUNTER — Ambulatory Visit (INDEPENDENT_AMBULATORY_CARE_PROVIDER_SITE_OTHER): Payer: BLUE CROSS/BLUE SHIELD | Admitting: Specialist

## 2019-01-03 ENCOUNTER — Other Ambulatory Visit: Payer: Self-pay

## 2019-01-03 ENCOUNTER — Other Ambulatory Visit (INDEPENDENT_AMBULATORY_CARE_PROVIDER_SITE_OTHER): Payer: Self-pay | Admitting: Radiology

## 2019-01-03 ENCOUNTER — Ambulatory Visit (INDEPENDENT_AMBULATORY_CARE_PROVIDER_SITE_OTHER): Payer: BLUE CROSS/BLUE SHIELD

## 2019-01-03 VITALS — BP 111/75 | HR 80 | Ht 68.0 in | Wt 316.0 lb

## 2019-01-03 DIAGNOSIS — M545 Low back pain, unspecified: Secondary | ICD-10-CM

## 2019-01-03 DIAGNOSIS — M7138 Other bursal cyst, other site: Secondary | ICD-10-CM

## 2019-01-03 DIAGNOSIS — M4316 Spondylolisthesis, lumbar region: Secondary | ICD-10-CM

## 2019-01-03 DIAGNOSIS — M5116 Intervertebral disc disorders with radiculopathy, lumbar region: Secondary | ICD-10-CM

## 2019-01-03 DIAGNOSIS — M48062 Spinal stenosis, lumbar region with neurogenic claudication: Secondary | ICD-10-CM

## 2019-01-03 MED ORDER — GABAPENTIN 300 MG PO CAPS: ORAL_CAPSULE | ORAL | 0 refills | Status: DC

## 2019-01-03 MED ORDER — HYDROCODONE-ACETAMINOPHEN 5-325 MG PO TABS: 1.0000 | ORAL_TABLET | Freq: Four times a day (QID) | ORAL | 0 refills | Status: DC | PRN

## 2019-01-03 NOTE — Progress Notes (Signed)
43 year old female with pain int the back and legs since.   Office Visit Note   Patient: Haley Wyatt           Date of Birth: [birth year]           MRN: [id number] Visit Date: 01/03/2019              Requested by: No referring provider defined for this encounter. PCP: Patient, No Pcp Per   Assessment & Plan: Visit Diagnoses:  1. Spondylolisthesis, lumbar region   2. Synovial cyst of lumbar facet joint   3. Intervertebral disc disorders with radiculopathy, lumbar region   4. Spinal stenosis of lumbar region with neurogenic claudication     Plan:Avoid bending, stooping and avoid lifting weights greater than 10 lbs. Avoid prolong standing and walking. Order for a new walker with wheels. Surgery scheduling secretary Kandice Hams, will call you in the next week to schedule for surgery.  Surgery recommended is a one level lumbar fusion L4-5 this would be done with rods, screws and cages with local bone graft and allograft (donor bone graft). Take hydrocodone for for pain. Risk of surgery includes risk of infection 1 in 200 patients, bleeding 1/2% chance you would need a transfusion.   Risk to the nerves is one in 10,000. You will need to use a brace for 3 months and wean from the brace on the 4th month. Expect improved walking and standing tolerance. Expect relief of leg pain but numbness may persist depending on the length and degree of pressure that has been present.  Follow-Up Instructions: Return in about 4 weeks (around 01/31/2019).   Orders:  No orders of the defined types were placed in this encounter.  Meds ordered this encounter  Medications  . gabapentin (NEURONTIN) 300 MG capsule    Sig: Take 1 capsule (300 mg total) by mouth 2 (two) times daily for 7 days, THEN 1 capsule (300 mg total) 3 (three) times daily for 21 days.    Dispense:  77 capsule    Refill:  0  . HYDROcodone-acetaminophen (NORCO/VICODIN) 5-325 MG tablet    Sig: Take 1-2 tablets by mouth every 6  (six) hours as needed for moderate pain.    Dispense:  30 tablet    Refill:  0      Procedures: No procedures performed   Clinical Data: No additional findings.   Subjective: Chief Complaint  Patient presents with  . Lower Back - Pain    HPI  Review of Systems  Constitutional: Negative.  Negative for activity change, appetite change, chills, diaphoresis, fatigue, fever and unexpected weight change.  HENT: Negative.  Negative for congestion, dental problem, drooling, ear discharge, ear pain, facial swelling, hearing loss, mouth sores, nosebleeds, postnasal drip, rhinorrhea, sinus pressure, sinus pain, sneezing, sore throat, tinnitus, trouble swallowing and voice change.   Eyes: Negative.  Negative for photophobia, pain, discharge, redness, itching and visual disturbance.  Respiratory: Negative.  Negative for apnea, cough, choking, chest tightness, shortness of breath, wheezing and stridor.   Cardiovascular: Negative.  Negative for chest pain, palpitations and leg swelling.  Gastrointestinal: Negative.  Negative for abdominal distention, abdominal pain, anal bleeding, blood in stool, constipation, diarrhea, nausea, rectal pain and vomiting.  Endocrine: Negative.  Negative for cold intolerance, heat intolerance, polydipsia, polyphagia and polyuria.  Genitourinary: Positive for urgency. Negative for difficulty urinating, dyspareunia, dysuria, enuresis, flank pain, frequency, genital sores, hematuria, menstrual problem and pelvic pain.  Musculoskeletal: Positive for back pain. Negative for  arthralgias, gait problem, joint swelling, myalgias, neck pain and neck stiffness.  Skin: Negative.  Negative for color change, pallor, rash and wound.  Allergic/Immunologic: Negative.  Negative for environmental allergies, food allergies and immunocompromised state.  Neurological: Positive for weakness and numbness. Negative for dizziness, tremors, seizures, syncope, facial asymmetry, speech  difficulty, light-headedness and headaches.  Hematological: Negative.  Negative for adenopathy. Does not bruise/bleed easily.  Psychiatric/Behavioral: Negative.  Negative for agitation, behavioral problems, confusion, decreased concentration, dysphoric mood, hallucinations, self-injury, sleep disturbance and suicidal ideas. The patient is not nervous/anxious and is not hyperactive.      Objective: Vital Signs: BP 111/75   Pulse 80   Ht 5\' 8"  (1.727 m)   Wt (!) 316 lb (143.3 kg)   LMP 04/28/2014   BMI 48.05 kg/m   Physical Exam Constitutional:      Appearance: She is well-developed.  HENT:     Head: Normocephalic and atraumatic.  Eyes:     Pupils: Pupils are equal, round, and reactive to light.  Neck:     Musculoskeletal: Normal range of motion and neck supple.  Pulmonary:     Effort: Pulmonary effort is normal.     Breath sounds: Normal breath sounds.  Abdominal:     General: Bowel sounds are normal.     Palpations: Abdomen is soft.  Skin:    General: Skin is warm and dry.  Neurological:     Mental Status: She is alert and oriented to person, place, and time.  Psychiatric:        Behavior: Behavior normal.        Thought Content: Thought content normal.        Judgment: Judgment normal.     Back Exam   Tenderness  The patient is experiencing tenderness in the lumbar.  Range of Motion  Extension: abnormal  Flexion: normal  Lateral bend right: abnormal  Lateral bend left: abnormal  Rotation right: abnormal  Rotation left: abnormal   Muscle Strength  Right Quadriceps:  5/5  Left Quadriceps:  5/5  Right Hamstrings:  5/5  Left Hamstrings:  5/5   Tests  Straight leg raise right: negative Straight leg raise left: negative  Reflexes  Patellar: 2/4 Achilles: 2/4 Biceps: 2/4 Babinski's sign: normal   Other  Toe walk: abnormal Heel walk: abnormal Sensation: normal Gait: normal  Erythema: no back redness Scars: absent  Comments:  Motor is normal  Numbness toes left foot great toe and second toe and on the right medial 3 toes sometimes the whole foot.  Pain with extension localizes to the Lumbosacral junction.      Specialty Comments:  No specialty comments available.  Imaging: Xr Lumb Spine Flex&ext Only  Result Date: 01/03/2019 Lateral flexion and extension radiographs of the lumbar spine show DDD L4-5 with anteriolisthesis grade 1, 4 mm worsens to 6 mm with flexion.     PMFS History: Patient Active Problem List   Diagnosis Date Noted  . Dysmenorrhea 01/28/2015  . Fibroid 01/28/2015  . 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 Morbid obesity ([place]) 07/16/2013  . HYPERTENSION 08/16/2010  . 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 LEG EDEMA, 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  . GERD 06/11/2010  . HYPOTHYROIDISM 04/16/2009  . OBESITY 04/16/2009  . DEPRESSION 03/30/2009   Past Medical History:  Diagnosis Date  . Allergy   . Back pain    "muscle strain from exercising"  . Borderline diabetes    states "levels have been about 102"  . Fibroid tumor  inside uterus  . GERD (gastroesophageal reflux disease)    PT STATES "DOES NOT HAVE IT NOW"  . H/O hiatal hernia   . Hypertension    NO MEDS.. BP CONTROLLED    Family History  Problem Relation Age of Onset  . Hyperlipidemia Father   . Hypertension Father   . Diabetes Father   . Cancer Mother   . Sudden death Neg Hx   . Heart attack Neg Hx     Past Surgical History:  Procedure Laterality Date  . ABDOMINAL HYSTERECTOMY    . BILATERAL SALPINGECTOMY Bilateral 01/28/2015   Procedure: BILATERAL SALPINGECTOMY;  Surgeon: Eldred Manges, MD;  Location: [facility];  Service: Gynecology;  Laterality: Bilateral;  . CARPAL TUNNEL RELEASE    . DILITATION & CURRETTAGE/HYSTROSCOPY WITH HYDROTHERMAL ABLATION N/A 04/16/2014   Procedure: DILATATION & CURETTAGE/HYSTEROSCOPY WITH HYDROTHERMAL ABLATION;  Surgeon: Frederico Hamman, MD;  Location: [facility];  Service: Gynecology;   Laterality: N/A;  . HIATAL HERNIA REPAIR N/A 02/11/2014   Procedure: LAPAROSCOPIC REPAIR OF HIATAL HERNIA;  Surgeon: Pedro Earls, MD;  Location: [facility];  Service: General;  Laterality: N/A;  . LAPAROSCOPIC GASTRIC SLEEVE RESECTION N/A 02/11/2014   Procedure: LAPAROSCOPIC GASTRIC SLEEVE RESECTION;  Surgeon: Pedro Earls, MD;  Location: [facility];  Service: General;  Laterality: N/A;  . SHOULDER SURGERY     left shoulder loose body removal  . SUPRACERVICAL ABDOMINAL HYSTERECTOMY Bilateral 01/28/2015   Procedure: TOTAL SUPER CERVICAL ABDOMINAL HYSTERECTOMY BILATERAL SALPINGECTOMY;  Surgeon: Eldred Manges, MD;  Location: [facility];  Service: Gynecology;  Laterality: Bilateral;  . TUBAL LIGATION    . UPPER GI ENDOSCOPY  02/11/2014   Procedure: UPPER GI ENDOSCOPY;  Surgeon: Pedro Earls, MD;  Location: [facility];  Service: General;;   Social History   Occupational History  . 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 Smoking status: Former Smoker    Types: Cigarettes    Last attempt to quit: 02/05/2006    Years since quitting: 12.9  . Smokeless tobacco: Never Used  Substance and Sexual Activity  . Alcohol use: Yes    Comment: occ  . Drug use: No  . Sexual activity: Not on file

## 2019-01-03 NOTE — Patient Instructions (Signed)
Avoid bending, stooping and avoid lifting weights greater than 10 lbs. Avoid prolong standing and walking. Order for a new walker with wheels. Surgery scheduling secretary Sherri Billings, will call you in the next week to schedule for surgery.  Surgery recommended is a one level lumbar fusion L4-5 this would be done with rods, screws and cages with local bone graft and allograft (donor bone graft). Take hydrocodone for for pain. Risk of surgery includes risk of infection 1 in 200 patients, bleeding 1/2% chance you would need a transfusion.   Risk to the nerves is one in 10,000. You will need to use a brace for 3 months and wean from the brace on the 4th month. Expect improved walking and standing tolerance. Expect relief of leg pain but numbness may persist depending on the length and degree of pressure that has been present.   

## 2019-01-09 ENCOUNTER — Other Ambulatory Visit (INDEPENDENT_AMBULATORY_CARE_PROVIDER_SITE_OTHER): Payer: Self-pay | Admitting: Orthopaedic Surgery

## 2019-01-20 ENCOUNTER — Other Ambulatory Visit (INDEPENDENT_AMBULATORY_CARE_PROVIDER_SITE_OTHER): Payer: Self-pay | Admitting: Specialist

## 2019-01-20 NOTE — Telephone Encounter (Signed)
Rx request 

## 2019-01-21 ENCOUNTER — Other Ambulatory Visit (INDEPENDENT_AMBULATORY_CARE_PROVIDER_SITE_OTHER): Payer: Self-pay | Admitting: Specialist

## 2019-01-21 MED ORDER — HYDROCODONE-ACETAMINOPHEN 5-325 MG PO TABS: 1.0000 | ORAL_TABLET | Freq: Four times a day (QID) | ORAL | 0 refills | Status: DC | PRN

## 2019-01-27 ENCOUNTER — Other Ambulatory Visit (INDEPENDENT_AMBULATORY_CARE_PROVIDER_SITE_OTHER): Payer: Self-pay | Admitting: Specialist

## 2019-01-28 NOTE — Telephone Encounter (Signed)
Gabapentin refill request 

## 2019-02-01 ENCOUNTER — Other Ambulatory Visit (INDEPENDENT_AMBULATORY_CARE_PROVIDER_SITE_OTHER): Payer: Self-pay

## 2019-02-01 NOTE — Telephone Encounter (Signed)
Rx request 

## 2019-02-04 MED ORDER — TIZANIDINE HCL 4 MG PO TABS: 4.0000 mg | ORAL_TABLET | Freq: Three times a day (TID) | ORAL | 0 refills | Status: DC | PRN

## 2019-02-13 ENCOUNTER — Other Ambulatory Visit (INDEPENDENT_AMBULATORY_CARE_PROVIDER_SITE_OTHER): Payer: Self-pay | Admitting: Specialist

## 2019-02-13 MED ORDER — HYDROCODONE-ACETAMINOPHEN 5-325 MG PO TABS: 1.0000 | ORAL_TABLET | Freq: Four times a day (QID) | ORAL | 0 refills | Status: DC | PRN

## 2019-02-13 NOTE — Telephone Encounter (Signed)
norco refill request 

## 2019-02-22 ENCOUNTER — Other Ambulatory Visit (INDEPENDENT_AMBULATORY_CARE_PROVIDER_SITE_OTHER): Payer: Self-pay | Admitting: Specialist

## 2019-02-22 NOTE — Telephone Encounter (Signed)
Gabapentin refill request 

## 2019-02-26 ENCOUNTER — Other Ambulatory Visit (INDEPENDENT_AMBULATORY_CARE_PROVIDER_SITE_OTHER): Payer: Self-pay | Admitting: Specialist

## 2019-02-26 MED ORDER — HYDROCODONE-ACETAMINOPHEN 5-325 MG PO TABS: 1.0000 | ORAL_TABLET | Freq: Four times a day (QID) | ORAL | 0 refills | Status: DC | PRN

## 2019-02-26 NOTE — Telephone Encounter (Signed)
Please advise 

## 2019-03-13 ENCOUNTER — Other Ambulatory Visit (INDEPENDENT_AMBULATORY_CARE_PROVIDER_SITE_OTHER): Payer: Self-pay | Admitting: Specialist

## 2019-03-13 ENCOUNTER — Other Ambulatory Visit (INDEPENDENT_AMBULATORY_CARE_PROVIDER_SITE_OTHER): Payer: Self-pay | Admitting: Orthopaedic Surgery

## 2019-03-13 MED ORDER — HYDROCODONE-ACETAMINOPHEN 5-325 MG PO TABS: 1.0000 | ORAL_TABLET | Freq: Four times a day (QID) | ORAL | 0 refills | Status: DC | PRN

## 2019-03-13 MED ORDER — TIZANIDINE HCL 4 MG PO TABS: 4.0000 mg | ORAL_TABLET | Freq: Three times a day (TID) | ORAL | 0 refills | Status: DC | PRN

## 2019-03-13 NOTE — Telephone Encounter (Signed)
Please advise 

## 2019-03-13 NOTE — Telephone Encounter (Signed)
Please call her and let her know that I cannot provide this medication anymore after this last dose.

## 2019-03-13 NOTE — Telephone Encounter (Signed)
Refill request

## 2019-03-23 ENCOUNTER — Other Ambulatory Visit (INDEPENDENT_AMBULATORY_CARE_PROVIDER_SITE_OTHER): Payer: Self-pay | Admitting: Specialist

## 2019-03-29 ENCOUNTER — Other Ambulatory Visit (INDEPENDENT_AMBULATORY_CARE_PROVIDER_SITE_OTHER): Payer: Self-pay | Admitting: Orthopaedic Surgery

## 2019-03-29 ENCOUNTER — Other Ambulatory Visit (INDEPENDENT_AMBULATORY_CARE_PROVIDER_SITE_OTHER): Payer: Self-pay | Admitting: Specialist

## 2019-03-29 MED ORDER — HYDROCODONE-ACETAMINOPHEN 5-325 MG PO TABS: 1.0000 | ORAL_TABLET | Freq: Four times a day (QID) | ORAL | 0 refills | Status: DC | PRN

## 2019-03-29 NOTE — Telephone Encounter (Signed)
norco refill request 

## 2019-03-29 NOTE — Telephone Encounter (Signed)
Rx request 

## 2019-03-29 NOTE — Telephone Encounter (Signed)
Please advise. Looks like it was last filled by Dr. Louanne Skye.

## 2019-04-14 ENCOUNTER — Other Ambulatory Visit (INDEPENDENT_AMBULATORY_CARE_PROVIDER_SITE_OTHER): Payer: Self-pay | Admitting: Specialist

## 2019-04-15 MED ORDER — TIZANIDINE HCL 4 MG PO TABS: 4.0000 mg | ORAL_TABLET | Freq: Three times a day (TID) | ORAL | 0 refills | Status: DC | PRN

## 2019-04-15 NOTE — Telephone Encounter (Signed)
Nitka patient 

## 2019-04-15 NOTE — Telephone Encounter (Signed)
norco

## 2019-04-15 NOTE — Telephone Encounter (Signed)
Please advise 

## 2019-04-16 MED ORDER — HYDROCODONE-ACETAMINOPHEN 5-325 MG PO TABS: 1.0000 | ORAL_TABLET | Freq: Four times a day (QID) | ORAL | 0 refills | Status: DC | PRN

## 2019-04-20 ENCOUNTER — Other Ambulatory Visit (INDEPENDENT_AMBULATORY_CARE_PROVIDER_SITE_OTHER): Payer: Self-pay | Admitting: Specialist

## 2019-04-22 NOTE — Telephone Encounter (Signed)
Can you please advised, Dr. Louanne Skye and Jeneen Rinks are out of the office today?

## 2019-05-02 ENCOUNTER — Other Ambulatory Visit (INDEPENDENT_AMBULATORY_CARE_PROVIDER_SITE_OTHER): Payer: Self-pay | Admitting: Physical Medicine and Rehabilitation

## 2019-05-02 ENCOUNTER — Other Ambulatory Visit (INDEPENDENT_AMBULATORY_CARE_PROVIDER_SITE_OTHER): Payer: Self-pay | Admitting: Specialist

## 2019-05-02 MED ORDER — HYDROCODONE-ACETAMINOPHEN 5-325 MG PO TABS: 1.0000 | ORAL_TABLET | Freq: Four times a day (QID) | ORAL | 0 refills | Status: DC | PRN

## 2019-05-02 MED ORDER — TIZANIDINE HCL 4 MG PO TABS: 4.0000 mg | ORAL_TABLET | Freq: Three times a day (TID) | ORAL | 0 refills | Status: DC | PRN

## 2019-05-02 NOTE — Telephone Encounter (Signed)
Please advise 

## 2019-05-17 ENCOUNTER — Other Ambulatory Visit (INDEPENDENT_AMBULATORY_CARE_PROVIDER_SITE_OTHER): Payer: Self-pay | Admitting: Orthopaedic Surgery

## 2019-05-17 ENCOUNTER — Other Ambulatory Visit (INDEPENDENT_AMBULATORY_CARE_PROVIDER_SITE_OTHER): Payer: Self-pay | Admitting: Physical Medicine and Rehabilitation

## 2019-05-17 NOTE — Telephone Encounter (Signed)
Ok to rf? 

## 2019-05-20 MED ORDER — TIZANIDINE HCL 4 MG PO TABS: 4.0000 mg | ORAL_TABLET | Freq: Three times a day (TID) | ORAL | 0 refills | Status: DC | PRN

## 2019-05-20 NOTE — Telephone Encounter (Signed)
Please advise 

## 2019-12-11 ENCOUNTER — Other Ambulatory Visit: Payer: Self-pay

## 2019-12-11 ENCOUNTER — Encounter: Payer: Self-pay | Admitting: Family Medicine

## 2019-12-11 ENCOUNTER — Ambulatory Visit: Payer: 59 | Admitting: Family Medicine

## 2019-12-11 VITALS — BP 123/75 | HR 97 | Temp 98.1°F | Resp 17 | Ht 70.0 in | Wt 329.4 lb

## 2019-12-11 DIAGNOSIS — Z23 Encounter for immunization: Secondary | ICD-10-CM | POA: Diagnosis not present

## 2019-12-11 DIAGNOSIS — R0789 Other chest pain: Secondary | ICD-10-CM | POA: Diagnosis not present

## 2019-12-11 DIAGNOSIS — Z13228 Encounter for screening for other metabolic disorders: Secondary | ICD-10-CM

## 2019-12-11 DIAGNOSIS — R1031 Right lower quadrant pain: Secondary | ICD-10-CM | POA: Diagnosis not present

## 2019-12-11 DIAGNOSIS — R7303 Prediabetes: Secondary | ICD-10-CM | POA: Diagnosis not present

## 2019-12-11 DIAGNOSIS — Z1329 Encounter for screening for other suspected endocrine disorder: Secondary | ICD-10-CM

## 2019-12-11 DIAGNOSIS — Z13220 Encounter for screening for lipoid disorders: Secondary | ICD-10-CM

## 2019-12-11 DIAGNOSIS — R10813 Right lower quadrant abdominal tenderness: Secondary | ICD-10-CM

## 2019-12-11 DIAGNOSIS — R1013 Epigastric pain: Secondary | ICD-10-CM

## 2019-12-11 LAB — POCT CBC
Granulocyte percent: 56.2 %G (ref 37–80)
HCT, POC: 40.1 % (ref 29–41)
Hemoglobin: 12.9 g/dL (ref 11–14.6)
Lymph, poc: 3.1 (ref 0.6–3.4)
MCH, POC: 26.8 pg — AB (ref 27–31.2)
MCHC: 32.2 g/dL (ref 31.8–35.4)
MCV: 83.1 fL (ref 76–111)
MID (cbc): 0.4 (ref 0–0.9)
MPV: 7.6 fL (ref 0–99.8)
POC Granulocyte: 4.4 (ref 2–6.9)
POC LYMPH PERCENT: 39.6 %L (ref 10–50)
POC MID %: 4.2 %M (ref 0–12)
Platelet Count, POC: 206 10*3/uL (ref 142–424)
RBC: 4.82 M/uL (ref 4.04–5.48)
RDW, POC: 14.3 %
WBC: 7.8 10*3/uL (ref 4.6–10.2)

## 2019-12-11 LAB — COMPREHENSIVE METABOLIC PANEL
ALT: 15 IU/L (ref 0–32)
AST: 15 IU/L (ref 0–40)
Albumin/Globulin Ratio: 1.4 (ref 1.2–2.2)
Albumin: 4.1 g/dL (ref 3.8–4.8)
Alkaline Phosphatase: 75 IU/L (ref 39–117)
BUN/Creatinine Ratio: 14 (ref 9–23)
BUN: 11 mg/dL (ref 6–24)
Bilirubin Total: 0.2 mg/dL (ref 0.0–1.2)
CO2: 24 mmol/L (ref 20–29)
Calcium: 9.2 mg/dL (ref 8.7–10.2)
Chloride: 102 mmol/L (ref 96–106)
Creatinine, Ser: 0.76 mg/dL (ref 0.57–1.00)
GFR calc Af Amer: 110 mL/min/{1.73_m2} (ref >59)
GFR calc non Af Amer: 95 mL/min/{1.73_m2} (ref >59)
Globulin, Total: 3 g/dL (ref 1.5–4.5)
Glucose: 110 mg/dL — ABNORMAL HIGH (ref 65–99)
Potassium: 4.5 mmol/L (ref 3.5–5.2)
Sodium: 139 mmol/L (ref 134–144)
Total Protein: 7.1 g/dL (ref 6.0–8.5)

## 2019-12-11 LAB — POCT GLYCOSYLATED HEMOGLOBIN (HGB A1C): Hemoglobin A1C: 6.1 % — AB (ref 4.0–5.6)

## 2019-12-11 LAB — LIPID PANEL
Chol/HDL Ratio: 2.3 ratio (ref 0.0–4.4)
Cholesterol, Total: 178 mg/dL (ref 100–199)
HDL: 76 mg/dL (ref >39)
LDL Chol Calc (NIH): 92 mg/dL (ref 0–99)
Triglycerides: 53 mg/dL (ref 0–149)
VLDL Cholesterol Cal: 10 mg/dL (ref 5–40)

## 2019-12-11 LAB — POCT URINALYSIS DIP (MANUAL ENTRY)
Bilirubin, UA: NEGATIVE
Blood, UA: NEGATIVE
Glucose, UA: NEGATIVE mg/dL
Ketones, POC UA: NEGATIVE mg/dL
Leukocytes, UA: NEGATIVE
Nitrite, UA: NEGATIVE
Protein Ur, POC: NEGATIVE mg/dL
Spec Grav, UA: >=1.03 — AB (ref 1.010–1.025)
Urobilinogen, UA: 1 E.U./dL
pH, UA: 6.5 (ref 5.0–8.0)

## 2019-12-11 LAB — TSH: TSH: 2.19 u[IU]/mL (ref 0.450–4.500)

## 2019-12-11 NOTE — Patient Instructions (Signed)
Abdominal Pain, Adult Pain in the abdomen (abdominal pain) can be caused by many things. Often, abdominal pain is not serious and it gets better with no treatment or by being treated at home. However, sometimes abdominal pain is serious. Your health care provider will ask questions about your medical history and do a physical exam to try to determine the cause of your abdominal pain. Follow these instructions at home:  Medicines  Take over-the-counter and prescription medicines only as told by your health care provider.  Do not take a laxative unless told by your health care provider. General instructions  Watch your condition for any changes.  Drink enough fluid to keep your urine pale yellow.  Keep all follow-up visits as told by your health care provider. This is important. Contact a health care provider if:  Your abdominal pain changes or gets worse.  You are not hungry or you lose weight without trying.  You are constipated or have diarrhea for more than 2-3 days.  You have pain when you urinate or have a bowel movement.  Your abdominal pain wakes you up at night.  Your pain gets worse with meals, after eating, or with certain foods.  You are vomiting and cannot keep anything down.  You have a fever.  You have blood in your urine. Get help right away if:  Your pain does not go away as soon as your health care provider told you to expect.  You cannot stop vomiting.  Your pain is only in areas of the abdomen, such as the right side or the left lower portion of the abdomen. Pain on the right side could be caused by appendicitis.  You have bloody or black stools, or stools that look like tar.  You have severe pain, cramping, or bloating in your abdomen.  You have signs of dehydration, such as: ? Dark urine, very little urine, or no urine. ? Cracked lips. ? Dry mouth. ? Sunken eyes. ? Sleepiness. ? Weakness.  You have trouble breathing or chest  pain. Summary  Often, abdominal pain is not serious and it gets better with no treatment or by being treated at home. However, sometimes abdominal pain is serious.  Watch your condition for any changes.  Take over-the-counter and prescription medicines only as told by your health care provider.  Contact a health care provider if your abdominal pain changes or gets worse.  Get help right away if you have severe pain, cramping, or bloating in your abdomen. This information is not intended to replace advice given to you by your health care provider. Make sure you discuss any questions you have with your health care provider. Document Revised: 02/18/2019 Document Reviewed: 02/18/2019 Elsevier Patient Education  2020 Elsevier Inc.  

## 2019-12-11 NOTE — Progress Notes (Signed)
New Patient Office Visit  Subjective:  Patient ID: Haley Wyatt, female    DOB: 15-Aug-1976  Age: 44 y.o. MRN: [id number]  CC:  Chief Complaint  Patient presents with  . New Patient (Initial Visit)    Per pt concerns about severe pain under breasts after eating or drinking, pain level 8/10 and took nexium and helped after a couple day, ? fallopian tube infection-severe pain in right groin area and pt has a flap of skin that she needs a cream for smell but no rash.    HPI Haley Wyatt presents for   Chest Pain Onset 6 days ago Pain in her left chest under her breast She states that it took 3 days for the pain to subside She states that she decided to take nexium and wait and it resolved after 3 days She reports that she had pain from epigrastrium to the throat and also radiated to the back.  She denies nausea. The pain was not worse at night.  It was sharp. It stopped her from eating.  She reports that her MGM had colon cancer and heart disease. She has a history of gastric sleeve in 2017 She states that she is still taking the nexium 24hr once daily. She is not a smoker She was told she was prediabetic but after weight loss that resolved.  Morbid Obesity She reports that she had bariatric surgery in 2017 She was over 400 pounds and lost about 100 pounds She states that in 2016 she had a rough year.  She states that she regained 50 pounds due to back pain. She is not exercising She avoid oily greasy foods  Prediabetes She states that in the past she had prediabetes Once she had the surgery for weight loss she thought the prediabetes resolved She states that she is feeling better emotionally and ready to get her weight back down .   Past Medical History:  Diagnosis Date  . Allergy   . Back pain    "muscle strain from exercising"  . Borderline diabetes    states "levels have been about 102"  . Fibroid tumor    inside uterus  . GERD (gastroesophageal reflux  disease)    PT STATES "DOES NOT HAVE IT NOW"  . H/O hiatal hernia   . Hypertension    NO MEDS.. BP CONTROLLED    Past Surgical History:  Procedure Laterality Date  . ABDOMINAL HYSTERECTOMY    . BILATERAL SALPINGECTOMY Bilateral 01/28/2015   Procedure: BILATERAL SALPINGECTOMY;  Surgeon: Eldred Manges, MD;  Location: [facility];  Service: Gynecology;  Laterality: Bilateral;  . CARPAL TUNNEL RELEASE    . DILITATION & CURRETTAGE/HYSTROSCOPY WITH HYDROTHERMAL ABLATION N/A 04/16/2014   Procedure: DILATATION & CURETTAGE/HYSTEROSCOPY WITH HYDROTHERMAL ABLATION;  Surgeon: Frederico Hamman, MD;  Location: [facility];  Service: Gynecology;  Laterality: N/A;  . HIATAL HERNIA REPAIR N/A 02/11/2014   Procedure: LAPAROSCOPIC REPAIR OF HIATAL HERNIA;  Surgeon: Pedro Earls, MD;  Location: [facility];  Service: General;  Laterality: N/A;  . LAPAROSCOPIC GASTRIC SLEEVE RESECTION N/A 02/11/2014   Procedure: LAPAROSCOPIC GASTRIC SLEEVE RESECTION;  Surgeon: Pedro Earls, MD;  Location: [facility];  Service: General;  Laterality: N/A;  . SHOULDER SURGERY     left shoulder loose body removal  . SUPRACERVICAL ABDOMINAL HYSTERECTOMY Bilateral 01/28/2015   Procedure: TOTAL SUPER CERVICAL ABDOMINAL HYSTERECTOMY BILATERAL SALPINGECTOMY;  Surgeon: Eldred Manges, MD;  Location: [facility];  Service: Gynecology;  Laterality: Bilateral;  . TUBAL LIGATION    .  UPPER GI ENDOSCOPY  02/11/2014   Procedure: UPPER GI ENDOSCOPY;  Surgeon: Pedro Earls, MD;  Location: [facility];  Service: General;;    Family History  Problem Relation Age of Onset  . Hyperlipidemia Father   . Hypertension Father   . Diabetes Father   . Cancer Mother   . Diabetes Paternal Grandmother   . Sudden death Neg Hx   . Heart attack Neg Hx     Social History   Socioeconomic History  . Marital status: Single    Spouse name: Not on file  . Number of children: Not on file  . Years of education: Not on file  . Highest education level: Not on file    Occupational History  . 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 Smoking status: Former Smoker    Types: Cigarettes    Quit date: 02/05/2006    Years since quitting: 13.8  . Smokeless tobacco: Never Used  Substance and Sexual Activity  . Alcohol use: Yes    Comment: occ  . Drug use: No  . Sexual activity: Not on file  Other Topics Concern  . Not on file  Social History Narrative   Marital status: single      Children: four      Employment:  Mental health professional      Tobacco: none       Alcohol: wine per year.      Drugs: none      Exercise: stopped exercise one month ago.   Social Determinants of Health   Financial Resource Strain:   . Difficulty of Paying Living Expenses: Not on file  Food Insecurity:   . Worried About Charity fundraiser in the Last Year: Not on file  . Ran Out of Food in the Last Year: Not on file  Transportation Needs:   . Lack of Transportation (Medical): Not on file  . Lack of Transportation (Non-Medical): Not on file  Physical Activity:   . Days of Exercise per Week: Not on file  . Minutes of Exercise per Session: Not on file  Stress:   . Feeling of Stress : Not on file  Social Connections:   . Frequency of Communication with Friends and Family: Not on file  . Frequency of Social Gatherings with Friends and Family: Not on file  . Attends Religious Services: Not on file  . Active Member of Clubs or Organizations: Not on file  . Attends Archivist Meetings: Not on file  . Marital Status: Not on file  Intimate Partner Violence:   . Fear of Current or Ex-Partner: Not on file  . Emotionally Abused: Not on file  . Physically Abused: Not on file  . Sexually Abused: Not on file    ROS Review of Systems Review of Systems  Constitutional: Negative for activity change, appetite change, chills and fever.  HENT: Negative for congestion, nosebleeds, trouble swallowing and voice change.    Respiratory: Negative for cough, shortness of breath and wheezing.   Gastrointestinal: Negative for diarrhea, nausea and vomiting. See hpi Genitourinary: Negative for difficulty urinating, dysuria, flank pain and hematuria.  Musculoskeletal: Negative for back pain, joint swelling and neck pain.  Neurological: Negative for dizziness, speech difficulty, light-headedness and numbness.  See HPI. All other review of systems negative.   Objective:   Today's Vitals: BP 123/75 (BP Location: Right Arm, Patient Position: Sitting, Cuff Size: Large)   Pulse 97  Temp 98.1 F (36.7 C) (Oral)   Resp 17   Ht 5\' 10"  (1.778 m)   Wt (!) 329 lb 6.4 oz (149.4 kg)   LMP 04/28/2014   SpO2 98%   BMI 47.26 kg/m   Physical Exam  Physical Exam  Constitutional: Oriented to person, place, and time. Appears well-developed and well-nourished.  HENT:  Head: Normocephalic and atraumatic.  Eyes: Conjunctivae and EOM are normal.  Cardiovascular: Normal rate, regular rhythm, normal heart sounds and intact distal pulses.  No murmur heard. Pulmonary/Chest: Effort normal and breath sounds normal. No stridor. No respiratory distress. Has no wheezes.  Neurological: Is alert and oriented to person, place, and time.  Skin: Skin is warm. Capillary refill takes less than 2 seconds.  Psychiatric: Has a normal mood and affect. Behavior is normal. Judgment and thought content normal.   ABDOMEN: Normoactive bs, soft, tender to palpation at McBurney's point without rebound or guard, abdomen is soft, no epigastric or umbilical tenderness   ECG - NSR, no TWI, no rebound   Assessment & Plan:   Problem List Items Addressed This Visit      Other   Morbid obesity ([place])  - given her history of previous bariatic surgery will discuss at next visit   Relevant Orders   Lipid panel   TSH   POCT glycosylated hemoglobin (Hb A1C) (Completed)    Other Visit Diagnoses    Screening for thyroid disorder    -  Primary   Relevant  Orders   TSH   Screening for cholesterol level       Relevant Orders   Lipid panel   Encounter for screening for other metabolic disorders       Need for prophylactic vaccination and inoculation against influenza       Need for Tdap vaccination       Relevant Orders   Tdap vaccine greater than or equal to 7yo IM (Completed)   Other chest pain       Relevant Orders   Lipid panel   EKG 12-Lead (Completed)   Prediabetes       Relevant Orders   Comprehensive metabolic panel   POCT glycosylated hemoglobin (Hb A1C) (Completed)   Right lower quadrant abdominal pain       Relevant Orders   Comprehensive metabolic panel   POCT urinalysis dipstick (Completed)   POCT CBC (Completed)   CT ABDOMEN PELVIS W CONTRAST   Epigastric pain    -  Discussed referrals and next steps She should    Relevant Orders   Ambulatory referral to Gastroenterology   CT ABDOMEN PELVIS W CONTRAST   Tenderness at McBurney's point    -  Discussed that she should pay attention for signs of acute abdomen   Relevant Orders   CT ABDOMEN PELVIS W CONTRAST      Outpatient Encounter Medications as of 12/11/2019  Medication Sig  . calcium carbonate (OSCAL) 1500 (600 Ca) MG TABS tablet Take by mouth 2 (two) times daily with a meal.  . ibuprofen (ADVIL,MOTRIN) 800 MG tablet Take 1 tablet (800 mg total) by mouth 3 (three) times daily.  . Multiple Vitamin (MULTIVITAMIN) tablet Take 1 tablet by mouth daily.  . vitamin B-12 (CYANOCOBALAMIN) 1000 MCG tablet Take 1,000 mcg by mouth daily.  Marland Kitchen acetaminophen-codeine (TYLENOL #3) 300-30 MG tablet Take 1-2 tablets by mouth every 8 (eight) hours as needed for moderate pain. (Patient not taking: Reported on 12/11/2019)  . cyclobenzaprine (FLEXERIL) 10 MG tablet Take 1  tablet (10 mg total) by mouth 2 (two) times daily as needed for muscle spasms. (Patient not taking: Reported on 12/11/2019)  . gabapentin (NEURONTIN) 300 MG capsule TAKE 1 CAPSULE BY MOUTH 2 TIMES DAILY FOR 7 DAYS, THEN 1  CAPSULE 3 TIMES DAILY FOR 21 DAYS. (Patient not taking: Reported on 12/11/2019)  . HYDROcodone-acetaminophen (NORCO/VICODIN) 5-325 MG tablet Take 1-2 tablets by mouth every 6 (six) hours as needed for moderate pain. (Patient not taking: Reported on 12/11/2019)  . meloxicam (MOBIC) 7.5 MG tablet Take 1 tablet (7.5 mg total) by mouth daily. (Patient not taking: Reported on 12/11/2019)  . methylPREDNISolone (MEDROL) 4 MG tablet Medrol dose pack. Take as instructed (Patient not taking: Reported on 12/11/2019)  . tiZANidine (ZANAFLEX) 4 MG tablet Take 1 tablet (4 mg total) by mouth every 8 (eight) hours as needed for muscle spasms. (Patient not taking: Reported on 12/11/2019)  . traMADol (ULTRAM) 50 MG tablet Take 1-2 tablets (50-100 mg total) by mouth every 6 (six) hours as needed. (Patient not taking: Reported on 12/11/2019)   No facility-administered encounter medications on file as of 12/11/2019.    Follow-up: Return for next tuesday virtual to follow up on ct scan.   Forrest Moron, MD

## 2019-12-16 ENCOUNTER — Telehealth: Payer: Self-pay | Admitting: Family Medicine

## 2019-12-16 NOTE — Telephone Encounter (Signed)
12/16/2019 - PATIENT HAD AN APPOINTMENT WITH DR. Nolon Rod ON Wednesday (12/11/2019) FOR A NEW PATIENT APPOINTMENT. DR. Nolon Rod HAS REQUESTED SHE HAVE A VIRTUAL VISIT ON TUES. (12/17/2019) TO FOLLOW-UP ON HER CT SCAN. I TRIED TO CALL AND SCHEDULE BUT PATIENT'S MAILBOX WAS FULL. I CALLED HER THIS MORNING Monday (12/16/2019) AND WAS ABLE TO LEAVE HER A VOICE MAIL TO RETURN MY CALL. DR. Nolon Rod OK'ED HER VIRTUAL TO BE DOUBLE BOOKED. [place]

## 2020-01-09 ENCOUNTER — Telehealth: Payer: Self-pay

## 2020-01-10 ENCOUNTER — Encounter: Payer: Self-pay | Admitting: Family Medicine

## 2020-01-14 ENCOUNTER — Encounter: Payer: Self-pay | Admitting: Physician Assistant

## 2020-01-14 NOTE — Telephone Encounter (Signed)
Pt scheduled mychart video visit on 3/29

## 2020-01-16 ENCOUNTER — Other Ambulatory Visit: Payer: Self-pay

## 2020-01-16 ENCOUNTER — Ambulatory Visit
Admission: RE | Admit: 2020-01-16 | Discharge: 2020-01-16 | Disposition: A | Discharge status: Home / Self Care | Payer: 59 | Source: Ambulatory Visit | Attending: Family Medicine | Admitting: Family Medicine

## 2020-01-16 DIAGNOSIS — R1013 Epigastric pain: Secondary | ICD-10-CM

## 2020-01-16 DIAGNOSIS — R1031 Right lower quadrant pain: Secondary | ICD-10-CM

## 2020-01-16 MED ORDER — IOPAMIDOL (ISOVUE-300) INJECTION 61%
100.0000 mL | Freq: Once | INTRAVENOUS | Status: AC | PRN
  Administered 2020-01-16: 125 mL via INTRAVENOUS

## 2020-01-20 ENCOUNTER — Encounter: Payer: Self-pay | Admitting: Family Medicine

## 2020-01-20 ENCOUNTER — Telehealth (INDEPENDENT_AMBULATORY_CARE_PROVIDER_SITE_OTHER): Payer: 59 | Admitting: Family Medicine

## 2020-01-20 ENCOUNTER — Other Ambulatory Visit: Payer: Self-pay

## 2020-01-20 VITALS — Wt 330.0 lb

## 2020-01-20 DIAGNOSIS — K219 Gastro-esophageal reflux disease without esophagitis: Secondary | ICD-10-CM

## 2020-01-20 NOTE — Progress Notes (Signed)
VIDEO Encounter- SOAP NOTE Established Patient BUT COULD NOT ESTABLISH AUDIO SO USED TELEPHONE FOR VISIT  This telephone encounter was conducted with the patient's (or proxy's) verbal consent via audio telecommunications: yes/no: Yes Patient was instructed to have this encounter in a suitably private space; and to only have persons present to whom they give permission to participate. In addition, patient identity was confirmed by use of name plus two identifiers (DOB and address).  I discussed the limitations, risks, security and privacy concerns of performing an evaluation and management service by telephone and the availability of in person appointments. I also discussed with the patient that there may be a patient responsible charge related to this service. The patient expressed understanding and agreed to proceed.  I spent a total of TIME; 0 MIN TO 60 MIN: 15 minutes talking with the patient or their proxy.  Chief Complaint  Patient presents with  . f/u test results, wt loss and per pt she has a wart on left     Subjective   Haley Wyatt is a 44 y.o. established patient. Telephone visit today for  HPI   Pt following up on the CT abd pelvis from 01/16/20 She reports that her symptoms are improving and she is not needing the nexium as much. She reports that she is not vomiting No chest pains She is able to eat She wants to start trying to lose weight.   She had a reaction with a lump under her armpit on the side where she had the vaccine but it has disappeared now.    Patient Active Problem List   Diagnosis Date Noted  . Dysmenorrhea 01/28/2015  . Fibroid 01/28/2015  . 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 Morbid obesity ([place]) 07/16/2013  . HYPERTENSION 08/16/2010  . 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 LEG EDEMA, 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  . GERD 06/11/2010  . HYPOTHYROIDISM 04/16/2009  . OBESITY 04/16/2009  . DEPRESSION 03/30/2009    Past  Medical History:  Diagnosis Date  . Allergy   . Back pain    "muscle strain from exercising"  . Borderline diabetes    states "levels have been about 102"  . Fibroid tumor    inside uterus  . GERD (gastroesophageal reflux disease)    PT STATES "DOES NOT HAVE IT NOW"  . H/O hiatal hernia   . Hypertension    NO MEDS.. BP CONTROLLED    Current Outpatient Medications  Medication Sig Dispense Refill  . calcium carbonate (OSCAL) 1500 (600 Ca) MG TABS tablet Take by mouth 2 (two) times daily with a meal.    . ibuprofen (ADVIL,MOTRIN) 800 MG tablet Take 1 tablet (800 mg total) by mouth 3 (three) times daily. 21 tablet 0  . Multiple Vitamin (MULTIVITAMIN) tablet Take 1 tablet by mouth daily.    . vitamin B-12 (CYANOCOBALAMIN) 1000 MCG tablet Take 1,000 mcg by mouth daily.    Marland Kitchen acetaminophen-codeine (TYLENOL #3) 300-30 MG tablet Take 1-2 tablets by mouth every 8 (eight) hours as needed for moderate pain. (Patient not taking: Reported on 12/11/2019) 30 tablet 0  . cyclobenzaprine (FLEXERIL) 10 MG tablet Take 1 tablet (10 mg total) by mouth 2 (two) times daily as needed for muscle spasms. (Patient not taking: Reported on 12/11/2019) 20 tablet 0  . gabapentin (NEURONTIN) 300 MG capsule TAKE 1 CAPSULE BY MOUTH 2 TIMES DAILY FOR 7 DAYS, THEN 1 CAPSULE 3 TIMES DAILY FOR 21 DAYS. (Patient not taking: Reported on 12/11/2019) 231 capsule 1  .  HYDROcodone-acetaminophen (NORCO/VICODIN) 5-325 MG tablet Take 1-2 tablets by mouth every 6 (six) hours as needed for moderate pain. (Patient not taking: Reported on 12/11/2019) 30 tablet 0  . meloxicam (MOBIC) 7.5 MG tablet Take 1 tablet (7.5 mg total) by mouth daily. (Patient not taking: Reported on 12/11/2019) 15 tablet 0  . methylPREDNISolone (MEDROL) 4 MG tablet Medrol dose pack. Take as instructed (Patient not taking: Reported on 12/11/2019) 21 tablet 0  . tiZANidine (ZANAFLEX) 4 MG tablet Take 1 tablet (4 mg total) by mouth every 8 (eight) hours as needed for muscle  spasms. (Patient not taking: Reported on 12/11/2019) 30 tablet 0  . traMADol (ULTRAM) 50 MG tablet Take 1-2 tablets (50-100 mg total) by mouth every 6 (six) hours as needed. (Patient not taking: Reported on 12/11/2019) 40 tablet 0   No current facility-administered medications for this visit.    Allergies  Allergen Reactions  . No Known Allergies     Social History   Socioeconomic History  . Marital status: Single    Spouse name: Not on file  . Number of children: Not on file  . Years of education: Not on file  . Highest education level: Not on file  Occupational History  . 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 Smoking status: Former Smoker    Types: Cigarettes    Quit date: 02/05/2006    Years since quitting: 13.9  . Smokeless tobacco: Never Used  Substance and Sexual Activity  . Alcohol use: Yes    Comment: occ  . Drug use: No  . Sexual activity: Not on file  Other Topics Concern  . Not on file  Social History Narrative   Marital status: single      Children: four      Employment:  Mental health professional      Tobacco: none       Alcohol: wine per year.      Drugs: none      Exercise: stopped exercise one month ago.   Social Determinants of Health   Financial Resource Strain:   . Difficulty of Paying Living Expenses:   Food Insecurity:   . Worried About Charity fundraiser in the Last Year:   . Arboriculturist in the Last Year:   Transportation Needs:   . Film/video editor (Medical):   Marland Kitchen Lack of Transportation (Non-Medical):   Physical Activity:   . Days of Exercise per Week:   . Minutes of Exercise per Session:   Stress:   . Feeling of Stress :   Social Connections:   . Frequency of Communication with Friends and Family:   . Frequency of Social Gatherings with Friends and Family:   . Attends Religious Services:   . Active Member of Clubs or Organizations:   . Attends Archivist Meetings:   Marland Kitchen  Marital Status:   Intimate Partner Violence:   . Fear of Current or Ex-Partner:   . Emotionally Abused:   Marland Kitchen Physically Abused:   . Sexually Abused:     ROS Review of Systems  Constitutional: Negative for activity change, appetite change, chills and fever.  HENT: Negative for congestion, nosebleeds, trouble swallowing and voice change.   Respiratory: Negative for cough, shortness of breath and wheezing.   Gastrointestinal: Negative for diarrhea, nausea and vomiting.   Neurological: Negative for dizziness, speech difficulty, light-headedness and numbness.  See HPI. All other review of systems negative.  Objective   Vitals as reported by the patient: Today's Vitals   01/20/20 1342  Weight: (!) 330 lb (149.7 kg)   Physical Exam  Constitutional: Oriented to person, place, and time. Appears well-developed and well-nourished.  HENT:  Head: Normocephalic and atraumatic.  Eyes: Conjunctivae and EOM are normal.  Pulmonary/Chest: Effort normal and no respiratory distress. Neurological: Is alert and oriented to person, place, and time.  Skin: Skin is warm. Capillary refill takes less than 2 seconds.  Psychiatric: Has a normal mood and affect. Behavior is normal. Judgment and thought content normal.   Haley Wyatt was seen today for f/u test results, wt loss and per pt she has a wart on left .  Diagnoses and all orders for this visit:  Gastroesophageal reflux disease without esophagitis  -  Discussed following up with Gastroenterology before starting a diet strategy.  For now she should clean up her diet.    I discussed the assessment and treatment plan with the patient. The patient was provided an opportunity to ask questions and all were answered. The patient agreed with the plan and demonstrated an understanding of the instructions.   The patient was advised to call back or seek an in-person evaluation if the symptoms worsen or if the condition fails to improve as anticipated.  I  provided 15 minutes of non-face-to-face time during this encounter.  Forrest Moron, MD  Primary Care at [facility]

## 2020-01-20 NOTE — Patient Instructions (Signed)
° ° ° °  If you have lab work done today you will be contacted with your lab results within the next 2 weeks.  If you have not heard from us then please contact us. The fastest way to get your results is to register for My Chart. ° ° °IF you received an x-ray today, you will receive an invoice from [facility]. Please contact  Radiology at [phone] with questions or concerns regarding your invoice.  ° °IF you received labwork today, you will receive an invoice from [facility]. Please contact [facility] at [phone] with questions or concerns regarding your invoice.  ° °Our billing staff will not be able to assist you with questions regarding bills from these companies. ° °You will be contacted with the lab results as soon as they are available. The fastest way to get your results is to activate your My Chart account. Instructions are located on the last page of this paperwork. If you have not heard from us regarding the results in 2 weeks, please contact this office. °  ° ° ° °

## 2020-01-27 ENCOUNTER — Ambulatory Visit: Payer: 59 | Admitting: Physician Assistant

## 2020-02-04 ENCOUNTER — Ambulatory Visit (INDEPENDENT_AMBULATORY_CARE_PROVIDER_SITE_OTHER): Payer: 59 | Admitting: Physician Assistant

## 2020-02-04 ENCOUNTER — Encounter: Payer: Self-pay | Admitting: Physician Assistant

## 2020-02-04 VITALS — BP 110/70 | HR 80 | Temp 98.0°F | Ht 68.0 in | Wt 335.5 lb

## 2020-02-04 DIAGNOSIS — Z1211 Encounter for screening for malignant neoplasm of colon: Secondary | ICD-10-CM

## 2020-02-04 DIAGNOSIS — R1013 Epigastric pain: Secondary | ICD-10-CM | POA: Diagnosis not present

## 2020-02-04 DIAGNOSIS — K219 Gastro-esophageal reflux disease without esophagitis: Secondary | ICD-10-CM | POA: Diagnosis not present

## 2020-02-04 MED ORDER — ESOMEPRAZOLE MAGNESIUM 20 MG PO CPDR: 20.0000 mg | DELAYED_RELEASE_CAPSULE | Freq: Every day | ORAL | 5 refills | Status: DC

## 2020-02-04 MED ORDER — NA SULFATE-K SULFATE-MG SULF 17.5-3.13-1.6 GM/177ML PO SOLN: 1.0000 | Freq: Once | ORAL | 0 refills | Status: AC

## 2020-02-04 NOTE — Addendum Note (Signed)
Addended by: Lanny Hurst A on: 02/04/2020 01:48 PM   Modules accepted: Orders

## 2020-02-04 NOTE — Progress Notes (Signed)
Chief Complaint: Epigastric abdominal pain  HPI:    Haley Wyatt is a 44 year old African-American female with a past medical history as listed below who was referred to me by Forrest Moron, MD for a complaint of epigastric abdominal pain.      12/11/2019 CMP and CBC normal.    01/16/2020 CT abdomen pelvis with contrast for severe sharp epigastric pain.  There were no acute findings.    01/20/2020 patient had virtual visit with PCP and at that time was following up on the CT abdomen pelvis from 01/16/2020.  She was not needing the Nexium as much for her reflux and had no vomiting.    Today time, the patient tells me out of the blue about a month ago she was having severe intense "spasms" in her epigastric area that occurred anytime she ate anything and lasted for about 2 days.  She then bought over-the-counter Nexium 20 mg daily and started taking it and tells me that they worsened, she would get them even after drinking a sip of water, they would last for few seconds and then go away.  She continued the Nexium for the next week and the pain finally went away.  Testing was negative as above.  She stopped the Nexium after the pain stopped but over the past couple of weeks has noticed an increase in gas with increased eructations and flatulence.  Also noticed that her throat is irritated "just like it was when I had heartburn when I was pregnant".    Denies fever, chills, blood in her stool, weight loss or symptoms that awaken her from sleep.  Past Medical History:  Diagnosis Date  . Allergy   . Back pain    "muscle strain from exercising"  . Borderline diabetes    states "levels have been about 102"  . Fibroid tumor    inside uterus  . GERD (gastroesophageal reflux disease)    PT STATES "DOES NOT HAVE IT NOW"  . H/O hiatal hernia   . Hypertension    NO MEDS.. BP CONTROLLED    Past Surgical History:  Procedure Laterality Date  . ABDOMINAL HYSTERECTOMY    . BILATERAL SALPINGECTOMY  Bilateral 01/28/2015   Procedure: BILATERAL SALPINGECTOMY;  Surgeon: Eldred Manges, MD;  Location: [facility];  Service: Gynecology;  Laterality: Bilateral;  . CARPAL TUNNEL RELEASE    . DILITATION & CURRETTAGE/HYSTROSCOPY WITH HYDROTHERMAL ABLATION N/A 04/16/2014   Procedure: DILATATION & CURETTAGE/HYSTEROSCOPY WITH HYDROTHERMAL ABLATION;  Surgeon: Frederico Hamman, MD;  Location: [facility];  Service: Gynecology;  Laterality: N/A;  . HIATAL HERNIA REPAIR N/A 02/11/2014   Procedure: LAPAROSCOPIC REPAIR OF HIATAL HERNIA;  Surgeon: Pedro Earls, MD;  Location: [facility];  Service: General;  Laterality: N/A;  . LAPAROSCOPIC GASTRIC SLEEVE RESECTION N/A 02/11/2014   Procedure: LAPAROSCOPIC GASTRIC SLEEVE RESECTION;  Surgeon: Pedro Earls, MD;  Location: [facility];  Service: General;  Laterality: N/A;  . SHOULDER SURGERY     left shoulder loose body removal  . SUPRACERVICAL ABDOMINAL HYSTERECTOMY Bilateral 01/28/2015   Procedure: TOTAL SUPER CERVICAL ABDOMINAL HYSTERECTOMY BILATERAL SALPINGECTOMY;  Surgeon: Eldred Manges, MD;  Location: [facility];  Service: Gynecology;  Laterality: Bilateral;  . TUBAL LIGATION    . UPPER GI ENDOSCOPY  02/11/2014   Procedure: UPPER GI ENDOSCOPY;  Surgeon: Pedro Earls, MD;  Location: [facility];  Service: General;;    Current Outpatient Medications  Medication Sig Dispense Refill  . acetaminophen-codeine (TYLENOL #3) 300-30 MG tablet  Take 1-2 tablets by mouth every 8 (eight) hours as needed for moderate pain. (Patient not taking: Reported on 12/11/2019) 30 tablet 0  . calcium carbonate (OSCAL) 1500 (600 Ca) MG TABS tablet Take by mouth 2 (two) times daily with a meal.    . cyclobenzaprine (FLEXERIL) 10 MG tablet Take 1 tablet (10 mg total) by mouth 2 (two) times daily as needed for muscle spasms. (Patient not taking: Reported on 12/11/2019) 20 tablet 0  . gabapentin (NEURONTIN) 300 MG capsule TAKE 1 CAPSULE BY MOUTH 2 TIMES DAILY FOR 7 DAYS, THEN 1 CAPSULE 3 TIMES DAILY FOR  21 DAYS. (Patient not taking: Reported on 12/11/2019) 231 capsule 1  . HYDROcodone-acetaminophen (NORCO/VICODIN) 5-325 MG tablet Take 1-2 tablets by mouth every 6 (six) hours as needed for moderate pain. (Patient not taking: Reported on 12/11/2019) 30 tablet 0  . ibuprofen (ADVIL,MOTRIN) 800 MG tablet Take 1 tablet (800 mg total) by mouth 3 (three) times daily. 21 tablet 0  . meloxicam (MOBIC) 7.5 MG tablet Take 1 tablet (7.5 mg total) by mouth daily. (Patient not taking: Reported on 12/11/2019) 15 tablet 0  . methylPREDNISolone (MEDROL) 4 MG tablet Medrol dose pack. Take as instructed (Patient not taking: Reported on 12/11/2019) 21 tablet 0  . Multiple Vitamin (MULTIVITAMIN) tablet Take 1 tablet by mouth daily.    Marland Kitchen tiZANidine (ZANAFLEX) 4 MG tablet Take 1 tablet (4 mg total) by mouth every 8 (eight) hours as needed for muscle spasms. (Patient not taking: Reported on 12/11/2019) 30 tablet 0  . traMADol (ULTRAM) 50 MG tablet Take 1-2 tablets (50-100 mg total) by mouth every 6 (six) hours as needed. (Patient not taking: Reported on 12/11/2019) 40 tablet 0  . vitamin B-12 (CYANOCOBALAMIN) 1000 MCG tablet Take 1,000 mcg by mouth daily.     No current facility-administered medications for this visit.    Allergies as of 02/04/2020 - Review Complete 01/20/2020  Allergen Reaction Noted  . No known allergies  12/11/2019    Family History  Problem Relation Age of Onset  . Hyperlipidemia Father   . Hypertension Father   . Diabetes Father   . Cancer Mother   . Diabetes Paternal Grandmother   . Sudden death Neg Hx   . Heart attack Neg Hx     Social History   Socioeconomic History  . Marital status: Single    Spouse name: Not on file  . Number of children: Not on file  . Years of education: Not on file  . Highest education level: Not on file  Occupational History  . 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 Smoking status: Former Smoker    Types:  Cigarettes    Quit date: 02/05/2006    Years since quitting: 14.0  . Smokeless tobacco: Never Used  Substance and Sexual Activity  . Alcohol use: Yes    Comment: occ  . Drug use: No  . Sexual activity: Not on file  Other Topics Concern  . Not on file  Social History Narrative   Marital status: single      Children: four      Employment:  Mental health professional      Tobacco: none       Alcohol: wine per year.      Drugs: none      Exercise: stopped exercise one month ago.   Social Determinants of Health   Financial Resource Strain:   . Difficulty of Paying Living  Expenses:   Food Insecurity:   . Worried About Charity fundraiser in the Last Year:   . Arboriculturist in the Last Year:   Transportation Needs:   . Film/video editor (Medical):   Marland Kitchen Lack of Transportation (Non-Medical):   Physical Activity:   . Days of Exercise per Week:   . Minutes of Exercise per Session:   Stress:   . Feeling of Stress :   Social Connections:   . Frequency of Communication with Friends and Family:   . Frequency of Social Gatherings with Friends and Family:   . Attends Religious Services:   . Active Member of Clubs or Organizations:   . Attends Archivist Meetings:   Marland Kitchen Marital Status:   Intimate Partner Violence:   . Fear of Current or Ex-Partner:   . Emotionally Abused:   Marland Kitchen Physically Abused:   . Sexually Abused:     Review of Systems:    Constitutional: No weight loss, fever or chills Skin: No rash  Cardiovascular: No chest pain Respiratory: No SOB  Gastrointestinal: See HPI and otherwise negative Genitourinary: No dysuria  Neurological: No headache Musculoskeletal: No new muscle or joint pain Hematologic: No bleeding  Psychiatric: No history of depression or anxiety   Physical Exam:  Vital signs: BP 110/70 (BP Location: Left Arm, Patient Position: Sitting, Cuff Size: Large)   Pulse 80   Temp 98 F (36.7 C)   Ht 5\' 8"  (1.727 m) Comment: height measured  without shoes  Wt (!) 335 lb 8 oz (152.2 kg)   LMP 04/28/2014   BMI 51.01 kg/m   Constitutional:   Pleasant obese AA female appears to be in NAD, Well developed, Well nourished, alert and cooperative Head:  Normocephalic and atraumatic. Eyes:   PEERL, EOMI. No icterus. Conjunctiva pink. Ears:  Normal auditory acuity. Neck:  Supple Throat: Oral cavity and pharynx without inflammation, swelling or lesion.  Respiratory: Respirations even and unlabored. Lungs clear to auscultation bilaterally.   No wheezes, crackles, or rhonchi.  Cardiovascular: Normal S1, S2. No MRG. Regular rate and rhythm. No peripheral edema, cyanosis or pallor.  Gastrointestinal:  Soft, nondistended, mild epigastric ttp, No rebound or guarding. Normal bowel sounds. No appreciable masses or hepatomegaly. Rectal:  Not performed.  Msk:  Symmetrical without gross deformities. Without edema, no deformity or joint abnormality.  Neurologic:  Alert and  oriented x4;  grossly normal neurologically.  Skin:   Dry and intact without significant lesions or rashes. Psychiatric:  Demonstrates good judgement and reason without abnormal affect or behaviors.  RELEVANT LABS AND IMAGING: CBC    Component Value Date/Time   WBC 7.8 12/11/2019 1030   WBC 10.3 12/20/2017 1830   RBC 4.82 12/11/2019 1030   RBC 4.77 12/20/2017 1830   HGB 12.9 12/11/2019 1030   HGB 13.0 12/20/2017 1830   HCT 40.1 12/11/2019 1030   HCT 39.8 12/20/2017 1830   PLT 202 12/20/2017 1830   MCV 83.1 12/11/2019 1030   MCH 26.8 (A) 12/11/2019 1030   MCH 27.3 12/20/2017 1830   MCHC 32.2 12/11/2019 1030   MCHC 32.7 12/20/2017 1830   RDW 14.1 12/20/2017 1830   LYMPHSABS 3.7 02/13/2014 0505   MONOABS 0.6 02/13/2014 0505   EOSABS 0.0 02/13/2014 0505   BASOSABS 0.0 02/13/2014 0505    CMP     Component Value Date/Time   NA 139 12/11/2019 1034   K 4.5 12/11/2019 1034   CL 102 12/11/2019 1034   CO2  24 12/11/2019 1034   GLUCOSE 110 (H) 12/11/2019 1034    GLUCOSE 110 (H) 12/20/2017 1830   BUN 11 12/11/2019 1034   CREATININE 0.76 12/11/2019 1034   CREATININE 0.84 03/06/2013 2126   CALCIUM 9.2 12/11/2019 1034   PROT 7.1 12/11/2019 1034   ALBUMIN 4.1 12/11/2019 1034   AST 15 12/11/2019 1034   ALT 15 12/11/2019 1034   ALKPHOS 75 12/11/2019 1034   BILITOT 0.2 12/11/2019 1034   GFRNONAA 95 12/11/2019 1034   GFRAA 110 12/11/2019 1034    Assessment: 1.  Epigastric pain: With cramps/spasms better with Nexium over 2-week period; consider gastritis+/-H. Pylori+/-PUD 2.  GERD: Throat irritation and burning 3.  Screening for colorectal cancer: Patient is 12 and due for colon cancer screening  Plan: 1.  Scheduled the patient for an EGD and screening colonoscopy at the hospital with Dr. Hilarie Fredrickson.  Did discuss risks, benefits, limitations and alternatives and the patient agrees to proceed.  This is scheduled in the hospital due to her BMI.  Patient will be Covid tested 2 days prior to time of procedures. 2.  Instructed the patient to restart her Esomeprazole 20 mg daily.  Did provide her with a prescription to be taken 1 tablet p.o. 30-60 minutes before breakfast #30 with 5 refills. 3.  Reviewed antireflux diet and lifestyle modifications. 4.  Patient to follow in clinic per recommendations from Dr. Hilarie Fredrickson after time of procedures.  Ellouise Newer, PA-C [facility] 02/04/2020, 10:17 AM  Cc: Forrest Moron, MD

## 2020-02-04 NOTE — Patient Instructions (Signed)
If you are age 46 or older, your body mass index should be between 23-30. Your Body mass index is 51.01 kg/m. If this is out of the aforementioned range listed, please consider follow up with your Primary Care Provider.  If you are age 15 or younger, your body mass index should be between 19-25. Your Body mass index is 51.01 kg/m. If this is out of the aformentioned range listed, please consider follow up with your Primary Care Provider.        Due to recent changes in healthcare laws, you may see the results of your imaging and laboratory studies on MyChart before your provider has had a chance to review them.  We understand that in some cases there may be results that are confusing or concerning to you. Not all laboratory results come back in the same time frame and the provider may be waiting for multiple results in order to interpret others.  Please give [place] 48 hours in order for your provider to thoroughly review all the results before contacting the office for clarification of your results.   Thank you for choosing me and [facility] Ellouise Newer, PA-C

## 2020-02-05 ENCOUNTER — Ambulatory Visit (INDEPENDENT_AMBULATORY_CARE_PROVIDER_SITE_OTHER): Payer: 59

## 2020-02-05 ENCOUNTER — Other Ambulatory Visit: Payer: Self-pay

## 2020-02-05 ENCOUNTER — Ambulatory Visit: Payer: 59 | Admitting: Family Medicine

## 2020-02-05 ENCOUNTER — Encounter: Payer: Self-pay | Admitting: Family Medicine

## 2020-02-05 VITALS — BP 110/70 | HR 80 | Temp 97.2°F | Resp 17 | Ht 68.0 in | Wt 336.0 lb

## 2020-02-05 DIAGNOSIS — M67971 Unspecified disorder of synovium and tendon, right ankle and foot: Secondary | ICD-10-CM

## 2020-02-05 DIAGNOSIS — B078 Other viral warts: Secondary | ICD-10-CM

## 2020-02-05 DIAGNOSIS — M25531 Pain in right wrist: Secondary | ICD-10-CM

## 2020-02-05 NOTE — Patient Instructions (Addendum)
For carpal tunnel Use an ergonomic keyboard Use ibuprofen or tylenol arthritis for wrist pain Follow up with Physical therapy for wrist exercises     If you have lab work done today you will be contacted with your lab results within the next 2 weeks.  If you have not heard from [place] then please contact us. The fastest way to get your results is to register for My Chart.   IF you received an x-ray today, you will receive an invoice from [facility]. Please contact [facility] at [phone] with questions or concerns regarding your invoice.   IF you received labwork today, you will receive an invoice from [place]. Please contact [facility] at [phone] with questions or concerns regarding your invoice.   Our billing staff will not be able to assist you with questions regarding bills from these companies.  You will be contacted with the lab results as soon as they are available. The fastest way to get your results is to activate your My Chart account. Instructions are located on the last page of this paperwork. If you have not heard from [place] regarding the results in 2 weeks, please contact this office.     Warts  Warts are small growths on the skin. They are common and can occur on many areas of the body. A person may have one wart or several warts. In many cases, warts do not require treatment. They usually go away on their own over a period of many months to a few years. If needed, warts that cause problems or do not go away on their own can be treated. What are the causes? Warts are caused by a type of virus that is called human papillomavirus (HPV).  This virus can spread from person to person through direct contact.  Warts can also spread to other areas of the body when a person scratches a wart and then scratches another area of his or her body. What increases the risk? You are more likely to develop this condition if:  You are 18-42 years old.  You have a  weakened body defense system (immune system).  You are Caucasian. What are the signs or symptoms? The main symptom of this condition is small growths on the skin. Warts may:  Be round or oval or have an irregular shape.  Have a rough surface.  Range in color from skin color to light yellow, brown, or gray.  Generally be less than  inch (1.3 cm) in size.  Go away and then come back again. Most warts are painless, but some can be painful if they are large or occur in an area of the body where pressure will be applied to them, such as the bottom of the foot. How is this diagnosed? A wart can usually be diagnosed based on its appearance. In some cases, a tissue sample may be removed (biopsy) to be looked at under a microscope. How is this treated? In many cases, warts do not need treatment. Sometimes treatment is desired. If treatment is needed or desired, options may include:  Applying medicated solutions, creams, or patches to the wart. These may be over-the-counter or prescription medicines that make the skin soft so that layers will gradually shed away. In many cases, the medicine is applied one or two times per day and covered with a bandage.  Putting duct tape over the top of the wart (occlusion). You will leave the tape in place for as long as told by your health  care provider and then replace it with a new strip of tape. This is done until the wart goes away.  Freezing the wart with liquid nitrogen (cryotherapy).  Burning the wart with: ? Laser treatment. ? An electrified probe (electrocautery).  Injection of a medicine (Candida antigen) into the wart to help the body's immune system fight off the wart.  Surgery to remove the wart. Follow these instructions at home: Medicines  Apply over-the-counter and prescription medicines only as told by your health care provider.  Do not apply over-the-counter wart medicines to your face or genitals unless your health care provider  tells you to do that. Lifestyle  Keep your immune system healthy. To do this: ? Eat a healthy, balanced diet. ? Get enough sleep. ? Do not use any products that contain nicotine or tobacco, such as cigarettes and e-cigarettes. If you need help quitting, ask your health care provider. General instructions   Wash your hands after you touch a wart.  Do not scratch or pick at a wart.  Avoid shaving hair that is over a wart.  Keep all follow-up visits as told by your health care provider. This is important. Contact a health care provider if:  Your warts do not improve after treatment.  You have redness, swelling, or pain at the site of a wart.  You have bleeding from a wart that does not stop with light pressure.  You have diabetes and you develop a wart. Summary  Warts are small growths on the skin. They are common and can occur on many areas of the body.  In many cases, warts do not need treatment. Sometimes treatment is desired. If treatment is needed or desired, there are several treatment options.  Apply over-the-counter and prescription medicines only as told by your health care provider.  Wash your hands after you touch a wart.  Keep all follow-up visits as told by your health care provider. This is important. This information is not intended to replace advice given to you by your health care provider. Make sure you discuss any questions you have with your health care provider. Document Revised: 02/27/2018 Document Reviewed: 02/27/2018 Elsevier Patient Education  [place].  Cryosurgery for Skin Conditions Cryosurgery, also called cryotherapy, is the use of extremely cold liquid (liquid nitrogen) to freeze and remove abnormal or diseased tissue. Cryosurgery may be used to remove certain growths on the skin, such as:  Warts.  Skin sores that could turn into cancer (precancerous skin lesions or actinic keratoses).  Some skin cancers. Cryosurgery usually takes  a few minutes, and it can be done in your health care provider's office. Tell a health care provider about:  Any allergies you have.  All medicines you are taking, including vitamins, herbs, eye drops, creams, and over-the-counter medicines.  Any problems you or family members have had with anesthetic medicines.  Any blood disorders you have.  Any surgeries you have had.  Any medical conditions you have.  Whether you are pregnant or may be pregnant. What are the risks? Generally, this is a safe procedure. However, problems may occur, including:  Infection.  Bleeding.  Scarring.  Changes in skin color (lighter or darker than normal skin tone).  Swelling.  Hair loss in the treated area.  Damage to nearby structures or organs, such as nerve damage and loss of feeling. This is rare. What happens before the procedure? No specific preparation is needed for this procedure. Your health care provider will describe the procedure and  will discuss the benefits and risks of the procedure with you. What happens during the procedure?   Your procedure will be performed using one of the following methods: ? Your health care provider may apply a device (probe) to the skin. The probe has liquid nitrogen flowing through it to cool it down. The probe will be applied to the skin until the skin is frozen and destroyed. ? Your health care provider may apply liquid nitrogen to the skin with a swab or by spraying it on the skin until the skin is frozen and destroyed.  The treated area may be covered with a bandage (dressing). These procedures may vary among health care providers and clinics. What can I expect after procedure? After your procedure, it is common to have redness, swelling, and a blister that forms over the treated area. The blister may contain a small amount of blood. You may also have some mild stinging or a burning sensation that will resolve. If a blister forms, it will break open  on its own after about 2-4 weeks, leaving a scab. Then the treated area will heal. After healing, there is usually little or no scarring. Follow these instructions at home: Caring for the treated area   Follow instructions from your health care provider about how to take care of the treated area. If you have a dressing, make sure you: ? Wash your hands with soap and water for at least 20 seconds before and after you change your dressing. If soap and water are not available, use hand sanitizer. ? Change your dressing as told by your health care provider. ? Keep the dressing and the treated area clean and dry. If the dressing gets wet, change it right away. ? Clean the treated area with soap and water. ? Keep the area covered with a dressing until it heals, or for as long as told by your health care provider.  Check the treated area every day for signs of infection. Check for: ? More redness, swelling, or pain. ? More fluid or blood. ? Warmth. ? Pus or a bad smell.  If a blister forms, do not pick at your blister or try to break it open. Doing this can cause infection and scarring.  Do not apply any medicine, cream, or lotion to the treated area unless directed by your health care provider. General instructions  Take over-the-counter and prescription medicines only as told by your health care provider.  Do not use any products that contain nicotine or tobacco, such as cigarettes, e-cigarettes, and chewing tobacco. These can delay healing. If you need help quitting, ask your health care provider.  Do not take baths, swim, use a hot tub, hand-wash dishes, or otherwise soak the treated area until your health care provider approves. Ask your health care provider if you may take showers. You may only be allowed to take sponge baths.  Keep all follow-up visits as told by your health care provider. This is important. Contact a health care provider if:  You have more redness, swelling, or pain  around the treated area.  You have more fluid or blood coming from the treated area.  The treated area feels warm to the touch.  You have pus or a bad smell coming from the treated area.  Your blister becomes large and painful. Get help right away if:  You have a fever and have redness spreading from the treated area. Summary  Cryosurgery, also called cryotherapy, is the use of extreme  cold (liquid nitrogen) to freeze and remove abnormal growths or diseased tissue.  Cryosurgery usually takes a few minutes, and it can be done in your health care provider's office.  Generally, this is a safe procedure that requires no specific preparation beforehand.  There are two different methods for performing cryosurgery. One method involves using a device (probe) to freeze the growth, and the other method involves applying liquid nitrogen directly to the growth.  After treatment with cryotherapy, follow care instructions as provided by your health care provider. Watch for signs of infection. If a blister forms, do not pick at it or try to break it open. This information is not intended to replace advice given to you by your health care provider. Make sure you discuss any questions you have with your health care provider. Document Revised: 05/29/2019 Document Reviewed: 05/29/2019 Elsevier Patient Education  [place].

## 2020-02-05 NOTE — Progress Notes (Signed)
Established Patient Office Visit  Subjective:  Patient ID: Haley Wyatt, female    DOB: 02-05-1976  Age: 44 y.o. MRN: [id number]  CC:  Chief Complaint  Patient presents with  . wart removal    wrist pain and bump on back of right foot has erupted again    HPI Haley Wyatt presents for   Patient noted to have right wrist pain after using the computer and cooking.  She states that she has been having numbness and tingling.   She reports that she has been having right ankle plan  With standing for long periods She reports some swelling of the right ankle near the heel.  She also wants a wart removed from the left index finger  Past Medical History:  Diagnosis Date  . Allergy   . Back pain    "muscle strain from exercising"  . Borderline diabetes    states "levels have been about 102"  . Chronic headaches   . Fibroid tumor    inside uterus  . GERD (gastroesophageal reflux disease)    PT STATES "DOES NOT HAVE IT NOW"  . H/O hiatal hernia   . Hypertension    NO MEDS.. BP CONTROLLED    Past Surgical History:  Procedure Laterality Date  . ABDOMINAL HYSTERECTOMY    . BILATERAL SALPINGECTOMY Bilateral 01/28/2015   Procedure: BILATERAL SALPINGECTOMY;  Surgeon: Eldred Manges, MD;  Location: [facility];  Service: Gynecology;  Laterality: Bilateral;  . CARPAL TUNNEL RELEASE Right   . DILITATION & CURRETTAGE/HYSTROSCOPY WITH HYDROTHERMAL ABLATION N/A 04/16/2014   Procedure: DILATATION & CURETTAGE/HYSTEROSCOPY WITH HYDROTHERMAL ABLATION;  Surgeon: Frederico Hamman, MD;  Location: [facility];  Service: Gynecology;  Laterality: N/A;  . HIATAL HERNIA REPAIR N/A 02/11/2014   Procedure: LAPAROSCOPIC REPAIR OF HIATAL HERNIA;  Surgeon: Pedro Earls, MD;  Location: [facility];  Service: General;  Laterality: N/A;  . LAPAROSCOPIC GASTRIC SLEEVE RESECTION N/A 02/11/2014   Procedure: LAPAROSCOPIC GASTRIC SLEEVE RESECTION;  Surgeon: Pedro Earls, MD;  Location: [facility];  Service: General;   Laterality: N/A;  . SHOULDER SURGERY     left shoulder loose body removal  . SUPRACERVICAL ABDOMINAL HYSTERECTOMY Bilateral 01/28/2015   Procedure: TOTAL SUPER CERVICAL ABDOMINAL HYSTERECTOMY BILATERAL SALPINGECTOMY;  Surgeon: Eldred Manges, MD;  Location: [facility];  Service: Gynecology;  Laterality: Bilateral;  . TUBAL LIGATION    . UPPER GI ENDOSCOPY  02/11/2014   Procedure: UPPER GI ENDOSCOPY;  Surgeon: Pedro Earls, MD;  Location: [facility];  Service: General;;    Family History  Problem Relation Age of Onset  . Hyperlipidemia Father   . Hypertension Father   . Diabetes Father   . Schizophrenia Father   . Diverticulitis Mother   . Colon cancer Maternal Grandmother   . Diabetes Paternal Grandmother   . Sudden death Neg Hx   . Heart attack Neg Hx     Social History   Socioeconomic History  . Marital status: Single    Spouse name: Not on file  . Number of children: 4  . Years of education: Not on file  . Highest education level: Not on file  Occupational History  . 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 Smoking status: Former Smoker    Types: Cigarettes    Quit date: 02/05/2006    Years since quitting: 14.0  . Smokeless tobacco: Never Used  Substance and Sexual Activity  . Alcohol use: Yes  Comment: occ  . Drug use: No  . Sexual activity: Not on file  Other Topics Concern  . Not on file  Social History Narrative   Marital status: single      Children: four      Employment:  Mental health professional      Tobacco: none       Alcohol: wine per year.      Drugs: none      Exercise: stopped exercise one month ago.   Social Determinants of Health   Financial Resource Strain:   . Difficulty of Paying Living Expenses:   Food Insecurity:   . Worried About Charity fundraiser in the Last Year:   . Arboriculturist in the Last Year:   Transportation Needs:   . Film/video editor (Medical):   Marland Kitchen Lack of  Transportation (Non-Medical):   Physical Activity:   . Days of Exercise per Week:   . Minutes of Exercise per Session:   Stress:   . Feeling of Stress :   Social Connections:   . Frequency of Communication with Friends and Family:   . Frequency of Social Gatherings with Friends and Family:   . Attends Religious Services:   . Active Member of Clubs or Organizations:   . Attends Archivist Meetings:   Marland Kitchen Marital Status:   Intimate Partner Violence:   . Fear of Current or Ex-Partner:   . Emotionally Abused:   Marland Kitchen Physically Abused:   . Sexually Abused:     Outpatient Medications Prior to Visit  Medication Sig Dispense Refill  . Ascorbic Acid (VITAMIN C PO) Take 1 tablet by mouth daily.    Marland Kitchen esomeprazole (NEXIUM) 20 MG capsule Take 1 capsule (20 mg total) by mouth daily at 12 noon. 30 capsule 5  . Multiple Vitamin (MULTIVITAMIN) tablet Take 1 tablet by mouth daily.    . vitamin B-12 (CYANOCOBALAMIN) 1000 MCG tablet Take 1,000 mcg by mouth daily.     No facility-administered medications prior to visit.    Allergies  Allergen Reactions  . No Known Allergies     ROS Review of Systems Review of Systems  Constitutional: Negative for activity change, appetite change, chills and fever.  HENT: Negative for congestion, nosebleeds, trouble swallowing and voice change.   Respiratory: Negative for cough, shortness of breath and wheezing.   Gastrointestinal: Negative for diarrhea, nausea and vomiting.  Genitourinary: Negative for difficulty urinating, dysuria, flank pain and hematuria.  Musculoskeletal: Negative for back pain, joint swelling and neck pain.  Neurological: Negative for dizziness, speech difficulty, light-headedness and numbness.  See HPI. All other review of systems negative.     Objective:    Physical Exam  BP 110/70 (BP Location: Right Arm, Patient Position: Sitting, Cuff Size: Large)   Pulse 80   Temp (!) 97.2 F (36.2 C) (Temporal)   Resp 17   Ht 5'  8" (1.727 m)   Wt (!) 336 lb (152.4 kg)   LMP 04/28/2014   SpO2 97%   BMI 51.09 kg/m  Wt Readings from Last 3 Encounters:  02/05/20 (!) 336 lb (152.4 kg)  02/04/20 (!) 335 lb 8 oz (152.2 kg)  01/20/20 (!) 330 lb (149.7 kg)   Physical Exam  Constitutional: Oriented to person, place, and time. Appears well-developed and well-nourished.  HENT:  Head: Normocephalic and atraumatic.  Eyes: Conjunctivae and EOM are normal.  Cardiovascular: Normal rate, regular rhythm, normal heart sounds and intact distal pulses.  No  murmur heard. Pulmonary/Chest: Effort normal and breath sounds normal. No stridor. No respiratory distress. Has no wheezes.  Neurological: Is alert and oriented to person, place, and time.  Skin: Skin is warm. Capillary refill takes less than 2 seconds.  Psychiatric: Has a normal mood and affect. Behavior is normal. Judgment and thought content normal.   Finger wart left noted  Right heel with tenderness to the medial aspect of the achilles tendon    Health Maintenance Due  Topic Date Due  . PAP SMEAR-Modifier  07/16/2016    There are no preventive care reminders to display for this patient.  Lab Results  Component Value Date   TSH 2.190 12/11/2019   Lab Results  Component Value Date   WBC 7.8 12/11/2019   HGB 12.9 12/11/2019   HCT 40.1 12/11/2019   MCV 83.1 12/11/2019   PLT 202 12/20/2017   Lab Results  Component Value Date   NA 139 12/11/2019   K 4.5 12/11/2019   CO2 24 12/11/2019   GLUCOSE 110 (H) 12/11/2019   BUN 11 12/11/2019   CREATININE 0.76 12/11/2019   BILITOT 0.2 12/11/2019   ALKPHOS 75 12/11/2019   AST 15 12/11/2019   ALT 15 12/11/2019   PROT 7.1 12/11/2019   ALBUMIN 4.1 12/11/2019   CALCIUM 9.2 12/11/2019   ANIONGAP 9 12/20/2017   Lab Results  Component Value Date   CHOL 178 12/11/2019   Lab Results  Component Value Date   HDL 76 12/11/2019   Lab Results  Component Value Date   LDLCALC 92 12/11/2019   Lab Results  Component  Value Date   TRIG 53 12/11/2019   Lab Results  Component Value Date   CHOLHDL 2.3 12/11/2019   Lab Results  Component Value Date   HGBA1C 6.1 (A) 12/11/2019      Assessment & Plan:   Problem List Items Addressed This Visit    None    Visit Diagnoses    Common wart    -  Primary   Disorder of right Achilles tendon       Relevant Orders   Ambulatory referral to Podiatry   DG Wrist Complete Right (Completed)   Ambulatory referral to Physical Therapy     -  Discussed follow up with Podiatry Also advised PT to help with wrist and achilles tendon pain on right  No orders of the defined types were placed in this encounter.   Follow-up: Return in about 4 months (around 06/06/2020) for prediabetes, weight check .    Forrest Moron, MD

## 2020-02-10 NOTE — Progress Notes (Signed)
Addendum: Reviewed and agree with assessment and management plan. Shedrick Sarli M, MD  

## 2020-02-13 ENCOUNTER — Other Ambulatory Visit (HOSPITAL_COMMUNITY): Payer: 59

## 2020-02-18 ENCOUNTER — Telehealth: Payer: Self-pay | Admitting: Internal Medicine

## 2020-02-18 NOTE — Telephone Encounter (Signed)
[name], can you investigate why screening colon is not covered for this patient. 44 yo [place] female should be covered.  This may be a deductible issue and thus patient decision Thanks JMP

## 2020-02-18 NOTE — Telephone Encounter (Signed)
Appt cancelled per pt request. Dr. Hilarie Fredrickson aware.

## 2020-02-20 ENCOUNTER — Other Ambulatory Visit (HOSPITAL_COMMUNITY): Payer: 59

## 2020-02-24 ENCOUNTER — Ambulatory Visit (HOSPITAL_COMMUNITY): Admission: RE | Admit: 2020-02-24 | Payer: 59 | Source: Home / Self Care | Admitting: Internal Medicine

## 2020-02-24 SURGERY — COLONOSCOPY WITH PROPOFOL
Anesthesia: Monitor Anesthesia Care

## 2020-02-24 SURGERY — ESOPHAGOGASTRODUODENOSCOPY (EGD) WITH PROPOFOL
Anesthesia: Monitor Anesthesia Care

## 2020-04-10 ENCOUNTER — Ambulatory Visit (INDEPENDENT_AMBULATORY_CARE_PROVIDER_SITE_OTHER): Payer: 59

## 2020-04-10 ENCOUNTER — Ambulatory Visit (INDEPENDENT_AMBULATORY_CARE_PROVIDER_SITE_OTHER): Payer: 59 | Admitting: Podiatry

## 2020-04-10 ENCOUNTER — Other Ambulatory Visit: Payer: Self-pay

## 2020-04-10 DIAGNOSIS — M25471 Effusion, right ankle: Secondary | ICD-10-CM

## 2020-04-10 DIAGNOSIS — M7731 Calcaneal spur, right foot: Secondary | ICD-10-CM | POA: Diagnosis not present

## 2020-04-10 DIAGNOSIS — M6528 Calcific tendinitis, other site: Secondary | ICD-10-CM

## 2020-04-10 DIAGNOSIS — M25571 Pain in right ankle and joints of right foot: Secondary | ICD-10-CM | POA: Diagnosis not present

## 2020-04-10 MED ORDER — DICLOFENAC SODIUM 1 % EX GEL: 2.0000 g | Freq: Four times a day (QID) | CUTANEOUS | 1 refills | Status: DC

## 2020-04-10 NOTE — Progress Notes (Signed)
  Subjective:  Patient ID: Haley Wyatt, female    DOB: January 24, 1976,  MRN: [id number]  Chief Complaint  Patient presents with  . Foot Pain    Pt states right posterior heel and achilles swelling/pain, 6 month duration, on and off, no known injuries.    44 y.o. female presents with the above complaint. History confirmed with patient.   Objective:  Physical Exam: warm, good capillary refill, no trophic changes or ulcerative lesions, normal DP and PT pulses and normal sensory exam. Xerosis bilat. Right Foot: tenderness to palpation posterior calcaneus, no pain with calcaneal squeeze, decreased ankle joint ROM and +Silverskiold test normal exam, no swelling, tenderness, instability; ligaments intact, full range of motion of all ankle/foot joints other than findings noted above.  Radiographs: X-ray of the right foot: no evidence of calcaneal stress fracture, plantar calcaneal spur, posterior calcaneal spur and Haglund deformity noted  Assessment:   1. Calcific Achilles tendonitis   2. Calcaneal spur of right foot   3. Pain and swelling of right ankle    Plan:  Patient was evaluated and treated and all questions answered.  Achilles Tendonitis -XR reviewed with patient -Educated on stretching and icing of the affected limb. -Night splint dispensed.  -Recommend Voltaren Gel. Rx sent.  Return in about 6 weeks (around 05/22/2020).

## 2020-04-16 ENCOUNTER — Other Ambulatory Visit: Payer: Self-pay | Admitting: Podiatry

## 2020-04-16 DIAGNOSIS — M7731 Calcaneal spur, right foot: Secondary | ICD-10-CM

## 2020-04-16 DIAGNOSIS — M6528 Calcific tendinitis, other site: Secondary | ICD-10-CM

## 2020-06-02 ENCOUNTER — Other Ambulatory Visit: Payer: Self-pay

## 2020-06-02 ENCOUNTER — Ambulatory Visit (INDEPENDENT_AMBULATORY_CARE_PROVIDER_SITE_OTHER): Payer: 59 | Admitting: Podiatry

## 2020-06-02 ENCOUNTER — Ambulatory Visit (INDEPENDENT_AMBULATORY_CARE_PROVIDER_SITE_OTHER): Payer: 59 | Admitting: Registered Nurse

## 2020-06-02 ENCOUNTER — Encounter: Payer: Self-pay | Admitting: Registered Nurse

## 2020-06-02 VITALS — BP 138/84 | HR 107 | Temp 97.9°F | Resp 16 | Ht 69.0 in | Wt 332.0 lb

## 2020-06-02 DIAGNOSIS — M7660 Achilles tendinitis, unspecified leg: Secondary | ICD-10-CM

## 2020-06-02 DIAGNOSIS — M25471 Effusion, right ankle: Secondary | ICD-10-CM | POA: Diagnosis not present

## 2020-06-02 DIAGNOSIS — M25571 Pain in right ankle and joints of right foot: Secondary | ICD-10-CM | POA: Diagnosis not present

## 2020-06-02 DIAGNOSIS — M7731 Calcaneal spur, right foot: Secondary | ICD-10-CM | POA: Diagnosis not present

## 2020-06-02 DIAGNOSIS — M6528 Calcific tendinitis, other site: Secondary | ICD-10-CM | POA: Diagnosis not present

## 2020-06-02 DIAGNOSIS — R590 Localized enlarged lymph nodes: Secondary | ICD-10-CM

## 2020-06-02 MED ORDER — AMOXICILLIN-POT CLAVULANATE 875-125 MG PO TABS: 1.0000 | ORAL_TABLET | Freq: Two times a day (BID) | ORAL | 0 refills | Status: DC

## 2020-06-02 NOTE — Patient Instructions (Signed)

## 2020-06-02 NOTE — Patient Instructions (Signed)
° ° ° °  If you have lab work done today you will be contacted with your lab results within the next 2 weeks.  If you have not heard from us then please contact us. The fastest way to get your results is to register for My Chart. ° ° °IF you received an x-ray today, you will receive an invoice from [facility]. Please contact [facility] at [phone] with questions or concerns regarding your invoice.  ° °IF you received labwork today, you will receive an invoice from [facility]. Please contact [facility] at [phone] with questions or concerns regarding your invoice.  ° °Our billing staff will not be able to assist you with questions regarding bills from these companies. ° °You will be contacted with the lab results as soon as they are available. The fastest way to get your results is to activate your My Chart account. Instructions are located on the last page of this paperwork. If you have not heard from us regarding the results in 2 weeks, please contact this office. °  ° ° ° °

## 2020-06-02 NOTE — Progress Notes (Signed)
Acute Office Visit  Subjective:    Patient ID: Haley Wyatt, female    DOB: 02/17/1976, 44 y.o.   MRN: [id number]  Chief Complaint  Patient presents with  . Lymphadenopathy    pt has had swollen lymphnodes since sunday, pt notes that 2 are tender but otherwise okay, denies contact with any sick persons, pt reports no previous history of swelling     HPI Patient is in today for lymphadenopathy.  R cervical chain has been swollen and firm for the past 3-4 days. Does not seem to be worsening. No tenderness. Do not seem fixed. Pt does note that she has some unexplained bruising and feels extremely fatigued lately - had chalked this up to her busy work schedule.  No other symptoms. This has not happened before.  Past Medical History:  Diagnosis Date  . Allergy   . Back pain    "muscle strain from exercising"  . Borderline diabetes    states "levels have been about 102"  . Chronic headaches   . Fibroid tumor    inside uterus  . GERD (gastroesophageal reflux disease)    PT STATES "DOES NOT HAVE IT NOW"  . H/O hiatal hernia   . Hypertension    NO MEDS.. BP CONTROLLED    Past Surgical History:  Procedure Laterality Date  . ABDOMINAL HYSTERECTOMY    . BILATERAL SALPINGECTOMY Bilateral 01/28/2015   Procedure: BILATERAL SALPINGECTOMY;  Surgeon: Eldred Manges, MD;  Location: [facility];  Service: Gynecology;  Laterality: Bilateral;  . CARPAL TUNNEL RELEASE Right   . DILITATION & CURRETTAGE/HYSTROSCOPY WITH HYDROTHERMAL ABLATION N/A 04/16/2014   Procedure: DILATATION & CURETTAGE/HYSTEROSCOPY WITH HYDROTHERMAL ABLATION;  Surgeon: Frederico Hamman, MD;  Location: [facility];  Service: Gynecology;  Laterality: N/A;  . HIATAL HERNIA REPAIR N/A 02/11/2014   Procedure: LAPAROSCOPIC REPAIR OF HIATAL HERNIA;  Surgeon: Pedro Earls, MD;  Location: [facility];  Service: General;  Laterality: N/A;  . LAPAROSCOPIC GASTRIC SLEEVE RESECTION N/A 02/11/2014   Procedure: LAPAROSCOPIC GASTRIC SLEEVE  RESECTION;  Surgeon: Pedro Earls, MD;  Location: [facility];  Service: General;  Laterality: N/A;  . SHOULDER SURGERY     left shoulder loose body removal  . SUPRACERVICAL ABDOMINAL HYSTERECTOMY Bilateral 01/28/2015   Procedure: TOTAL SUPER CERVICAL ABDOMINAL HYSTERECTOMY BILATERAL SALPINGECTOMY;  Surgeon: Eldred Manges, MD;  Location: [facility];  Service: Gynecology;  Laterality: Bilateral;  . TUBAL LIGATION    . UPPER GI ENDOSCOPY  02/11/2014   Procedure: UPPER GI ENDOSCOPY;  Surgeon: Pedro Earls, MD;  Location: [facility];  Service: General;;    Family History  Problem Relation Age of Onset  . Hyperlipidemia Father   . Hypertension Father   . Diabetes Father   . Schizophrenia Father   . Diverticulitis Mother   . Colon cancer Maternal Grandmother   . Diabetes Paternal Grandmother   . Sudden death Neg Hx   . Heart attack Neg Hx     Social History   Socioeconomic History  . Marital status: Single    Spouse name: Not on file  . Number of children: 4  . Years of education: Not on file  . Highest education level: Not on file  Occupational History  . 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 Smoking status: Former Smoker    Types: Cigarettes    Quit date: 02/05/2006    Years since quitting: 14.3  . Smokeless tobacco: Never Used  Vaping Use  . Vaping Use: Never used  Substance and Sexual Activity  . Alcohol use: Yes    Comment: occ  . Drug use: No  . Sexual activity: Not on file  Other Topics Concern  . Not on file  Social History Narrative   Marital status: single      Children: four      Employment:  Mental health professional      Tobacco: none       Alcohol: wine per year.      Drugs: none      Exercise: stopped exercise one month ago.   Social Determinants of Health   Financial Resource Strain:   . Difficulty of Paying Living Expenses:   Food Insecurity:   . Worried About Charity fundraiser in the Last Year:   . Arts development officer in the Last Year:   Transportation Needs:   . Film/video editor (Medical):   Marland Kitchen Lack of Transportation (Non-Medical):   Physical Activity:   . Days of Exercise per Week:   . Minutes of Exercise per Session:   Stress:   . Feeling of Stress :   Social Connections:   . Frequency of Communication with Friends and Family:   . Frequency of Social Gatherings with Friends and Family:   . Attends Religious Services:   . Active Member of Clubs or Organizations:   . Attends Archivist Meetings:   Marland Kitchen Marital Status:   Intimate Partner Violence:   . Fear of Current or Ex-Partner:   . Emotionally Abused:   Marland Kitchen Physically Abused:   . Sexually Abused:     Outpatient Medications Prior to Visit  Medication Sig Dispense Refill  . Cyanocobalamin (B-12 SL) Place 1 Dose under the tongue daily.    . diclofenac Sodium (VOLTAREN) 1 % GEL Apply 2 g topically 4 (four) times daily. 100 g 1  . esomeprazole (NEXIUM) 20 MG capsule Take 1 capsule (20 mg total) by mouth daily at 12 noon. 30 capsule 5  . Multiple Minerals-Vitamins (CALCIUM & VIT D3 BONE HEALTH) LIQD Take 30 mLs by mouth daily.    . Multiple Vitamins-Minerals (MULTI-VITE) LIQD Take 30 mLs by mouth daily.     No facility-administered medications prior to visit.    No Known Allergies  Review of Systems  Constitutional: Positive for fatigue.  HENT: Negative.   Eyes: Negative.   Respiratory: Negative.   Cardiovascular: Negative.   Gastrointestinal: Negative.   Endocrine: Negative.   Genitourinary: Negative.   Musculoskeletal: Negative.   Skin: Negative.   Allergic/Immunologic: Negative.   Neurological: Negative.   Hematological: Positive for adenopathy.  Psychiatric/Behavioral: Negative.   All other systems reviewed and are negative.      Objective:    Physical Exam Vitals and nursing note reviewed.  Constitutional:      General: She is not in acute distress.    Appearance: Normal appearance. She is  obese. She is not ill-appearing, toxic-appearing or diaphoretic.  Neck:     Vascular: No carotid bruit.  Cardiovascular:     Rate and Rhythm: Normal rate.  Pulmonary:     Effort: Pulmonary effort is normal. No respiratory distress.  Musculoskeletal:     Cervical back: Normal range of motion and neck supple. No rigidity or tenderness.  Lymphadenopathy:     Cervical: Cervical adenopathy (r superficial cervical chain visibly enlarged. firm - nontender - ? of fixation on palpation.) present.  Skin:    Capillary  Refill: Capillary refill takes less than 2 seconds.  Neurological:     General: No focal deficit present.     Mental Status: She is alert and oriented to person, place, and time. Mental status is at baseline.  Psychiatric:        Mood and Affect: Mood normal.        Behavior: Behavior normal.        Thought Content: Thought content normal.        Judgment: Judgment normal.     BP 138/84   Pulse (!) 107   Temp 97.9 F (36.6 C) (Temporal)   Resp 16   Ht 5\' 9"  (1.753 m)   Wt (!) 332 lb (150.6 kg)   LMP 04/28/2014   SpO2 97%   BMI 49.03 kg/m  Wt Readings from Last 3 Encounters:  06/02/20 (!) 332 lb (150.6 kg)  02/05/20 (!) 336 lb (152.4 kg)  02/04/20 (!) 335 lb 8 oz (152.2 kg)    There are no preventive care reminders to display for this patient.  There are no preventive care reminders to display for this patient.   Lab Results  Component Value Date   TSH 2.190 12/11/2019   Lab Results  Component Value Date   WBC 7.8 12/11/2019   HGB 12.9 12/11/2019   HCT 40.1 12/11/2019   MCV 83.1 12/11/2019   PLT 202 12/20/2017   Lab Results  Component Value Date   NA 139 12/11/2019   K 4.5 12/11/2019   CO2 24 12/11/2019   GLUCOSE 110 (H) 12/11/2019   BUN 11 12/11/2019   CREATININE 0.76 12/11/2019   BILITOT 0.2 12/11/2019   ALKPHOS 75 12/11/2019   AST 15 12/11/2019   ALT 15 12/11/2019   PROT 7.1 12/11/2019   ALBUMIN 4.1 12/11/2019   CALCIUM 9.2 12/11/2019    ANIONGAP 9 12/20/2017   Lab Results  Component Value Date   CHOL 178 12/11/2019   Lab Results  Component Value Date   HDL 76 12/11/2019   Lab Results  Component Value Date   LDLCALC 92 12/11/2019   Lab Results  Component Value Date   TRIG 53 12/11/2019   Lab Results  Component Value Date   CHOLHDL 2.3 12/11/2019   Lab Results  Component Value Date   HGBA1C 6.1 (A) 12/11/2019       Assessment & Plan:   Problem List Items Addressed This Visit    None    Visit Diagnoses    Lymphadenopathy of right cervical region    -  Primary   Relevant Medications   amoxicillin-clavulanate (AUGMENTIN) 875-125 MG tablet   Other Relevant Orders   CBC With Differential   Basic Metabolic Panel   Epstein-Barr virus early D antigen antibody, IgG   CMV abs, IgG+IgM (cytomegalovirus)       Meds ordered this encounter  Medications  . amoxicillin-clavulanate (AUGMENTIN) 875-125 MG tablet    Sig: Take 1 tablet by mouth 2 (two) times daily.    Dispense:  14 tablet    Refill:  0    Order Specific Question:   Supervising Provider    Answer:   Carlota Raspberry, JEFFREY R [[id number]]   PLAN  Concern for EBV or CMV - will draw labs for each  Will draw CBC to r/o onc process.  Will give augmentin to treat for potential infection - no obvious signs but may be underlying infection given the intensity of the adenopathy.  Patient encouraged to call clinic with any questions,  comments, or concerns.   Maximiano Coss, NP

## 2020-06-02 NOTE — Progress Notes (Signed)
  Subjective:  Patient ID: Haley Wyatt, female    DOB: Jul 11, 1976,  MRN: [id number]  Chief Complaint  Patient presents with  . Follow-up    R achilles tendonitis. Pt stated, "It's not nearly as bad. If I had worn the brace every day in these past 6 weeks, I don't think I would have had any pain. I'm going to start wearing it every day. The swelling is more pronounced".   44 y.o. female presents with the above complaint. History confirmed with patient.   Objective:  Physical Exam: warm, good capillary refill, no trophic changes or ulcerative lesions, normal DP and PT pulses and normal sensory exam. Xerosis bilat. Right Foot: tenderness to palpation posterior calcaneus, no pain with calcaneal squeeze, decreased ankle joint ROM and +Silverskiold test  Assessment:   1. Calcific Achilles tendonitis   2. Calcaneal spur of right foot   3. Pain and swelling of right ankle    Plan:  Patient was evaluated and treated and all questions answered.  Achilles Tendonitis -Improving. Continue stretching and icing. -She is still very tight. Discussed the importance of compliance with stretching regimen. New stretching instructions dispensed. -F/u Should pain worsen or fail to improve.  No follow-ups on file.

## 2020-06-03 LAB — CBC WITH DIFFERENTIAL
Basophils Absolute: 0 10*3/uL (ref 0.0–0.2)
Basos: 0 %
EOS (ABSOLUTE): 0.1 10*3/uL (ref 0.0–0.4)
Eos: 1 %
Hematocrit: 42.4 % (ref 34.0–46.6)
Hemoglobin: 13.6 g/dL (ref 11.1–15.9)
Immature Grans (Abs): 0 10*3/uL (ref 0.0–0.1)
Immature Granulocytes: 0 %
Lymphocytes Absolute: 3.5 10*3/uL — ABNORMAL HIGH (ref 0.7–3.1)
Lymphs: 41 %
MCH: 27 pg (ref 26.6–33.0)
MCHC: 32.1 g/dL (ref 31.5–35.7)
MCV: 84 fL (ref 79–97)
Monocytes Absolute: 0.6 10*3/uL (ref 0.1–0.9)
Monocytes: 7 %
Neutrophils Absolute: 4.3 10*3/uL (ref 1.4–7.0)
Neutrophils: 51 %
RBC: 5.04 x10E6/uL (ref 3.77–5.28)
RDW: 13.6 % (ref 11.7–15.4)
WBC: 8.5 10*3/uL (ref 3.4–10.8)

## 2020-06-03 LAB — BASIC METABOLIC PANEL
BUN/Creatinine Ratio: 15 (ref 9–23)
BUN: 13 mg/dL (ref 6–24)
CO2: 27 mmol/L (ref 20–29)
Calcium: 9.8 mg/dL (ref 8.7–10.2)
Chloride: 103 mmol/L (ref 96–106)
Creatinine, Ser: 0.84 mg/dL (ref 0.57–1.00)
GFR calc Af Amer: 96 mL/min/{1.73_m2} (ref >59)
GFR calc non Af Amer: 84 mL/min/{1.73_m2} (ref >59)
Glucose: 114 mg/dL — ABNORMAL HIGH (ref 65–99)
Potassium: 4.9 mmol/L (ref 3.5–5.2)
Sodium: 139 mmol/L (ref 134–144)

## 2020-06-03 LAB — CMV ABS, IGG+IGM (CYTOMEGALOVIRUS)
CMV Ab - IgG: 5.3 U/mL — ABNORMAL HIGH (ref 0.00–0.59)
CMV IgM Ser EIA-aCnc: <30 AU/mL (ref 0.0–29.9)

## 2020-06-03 LAB — EPSTEIN-BARR VIRUS EARLY D ANTIGEN ANTIBODY, IGG: EBV Early Antigen Ab, IgG: <9 U/mL (ref 0.0–8.9)

## 2020-06-04 ENCOUNTER — Encounter: Payer: Self-pay | Admitting: Registered Nurse

## 2020-07-07 ENCOUNTER — Encounter: Payer: Self-pay | Admitting: Registered Nurse

## 2020-07-07 ENCOUNTER — Ambulatory Visit (INDEPENDENT_AMBULATORY_CARE_PROVIDER_SITE_OTHER): Payer: 59 | Admitting: Registered Nurse

## 2020-07-07 ENCOUNTER — Other Ambulatory Visit: Payer: Self-pay

## 2020-07-07 VITALS — BP 132/83 | HR 78 | Temp 97.9°F | Resp 18 | Ht 69.0 in | Wt 337.0 lb

## 2020-07-07 DIAGNOSIS — Z6841 Body Mass Index (BMI) 40.0 and over, adult: Secondary | ICD-10-CM

## 2020-07-07 DIAGNOSIS — D1724 Benign lipomatous neoplasm of skin and subcutaneous tissue of left leg: Secondary | ICD-10-CM

## 2020-07-07 NOTE — Patient Instructions (Signed)
° ° ° °  If you have lab work done today you will be contacted with your lab results within the next 2 weeks.  If you have not heard from us then please contact us. The fastest way to get your results is to register for My Chart. ° ° °IF you received an x-ray today, you will receive an invoice from [facility]. Please contact [facility] at [phone] with questions or concerns regarding your invoice.  ° °IF you received labwork today, you will receive an invoice from [facility]. Please contact [facility] at [phone] with questions or concerns regarding your invoice.  ° °Our billing staff will not be able to assist you with questions regarding bills from these companies. ° °You will be contacted with the lab results as soon as they are available. The fastest way to get your results is to activate your My Chart account. Instructions are located on the last page of this paperwork. If you have not heard from us regarding the results in 2 weeks, please contact this office. °  ° ° ° °

## 2020-07-13 ENCOUNTER — Encounter: Payer: Self-pay | Admitting: Registered Nurse

## 2020-07-14 ENCOUNTER — Encounter: Payer: Self-pay | Admitting: Registered Nurse

## 2020-07-14 NOTE — Progress Notes (Signed)
Established Patient Office Visit  Subjective:  Patient ID: Haley Wyatt, female    DOB: [birth year]  Age: 44 y.o. MRN: [id number]  CC:  Chief Complaint  Patient presents with  . Cyst    Patient states she has a knot on the left legs and also noticing one popping up on left arm since yesterday. Per patient its not painful but she noticed it. Also some cramping in muscles  . Weight Loss    patient would like to discuss weight loss    HPI [employer] presents for cysts and weight loss  Notes one on L leg and one on L arm. Popping up since yesterday. Firm areas, well circumscribed. Notes that they feel like they are deep. No head to them, no drainage, no redness. Mildly tender.   Also very motivated for weight loss. Would like to explore bariatric and nonpharm options. Has been dieting, plans to work on exercising more. Not interested in weight loss medications.   Past Medical History:  Diagnosis Date  . Allergy   . Back pain    "muscle strain from exercising"  . Borderline diabetes    states "levels have been about 102"  . Chronic headaches   . Fibroid tumor    inside uterus  . GERD (gastroesophageal reflux disease)    PT STATES "DOES NOT HAVE IT NOW"  . H/O hiatal hernia   . Hypertension    NO MEDS.. BP CONTROLLED    Past Surgical History:  Procedure Laterality Date  . ABDOMINAL HYSTERECTOMY    . BILATERAL SALPINGECTOMY Bilateral 01/28/2015   Procedure: BILATERAL SALPINGECTOMY;  Surgeon: Eldred Manges, MD;  Location: [facility];  Service: Gynecology;  Laterality: Bilateral;  . CARPAL TUNNEL RELEASE Right   . DILITATION & CURRETTAGE/HYSTROSCOPY WITH HYDROTHERMAL ABLATION N/A 04/16/2014   Procedure: DILATATION & CURETTAGE/HYSTEROSCOPY WITH HYDROTHERMAL ABLATION;  Surgeon: Frederico Hamman, MD;  Location: [facility];  Service: Gynecology;  Laterality: N/A;  . HIATAL HERNIA REPAIR N/A 02/11/2014   Procedure: LAPAROSCOPIC REPAIR OF HIATAL HERNIA;  Surgeon: Pedro Earls,  MD;  Location: [facility];  Service: General;  Laterality: N/A;  . LAPAROSCOPIC GASTRIC SLEEVE RESECTION N/A 02/11/2014   Procedure: LAPAROSCOPIC GASTRIC SLEEVE RESECTION;  Surgeon: Pedro Earls, MD;  Location: [facility];  Service: General;  Laterality: N/A;  . SHOULDER SURGERY     left shoulder loose body removal  . SUPRACERVICAL ABDOMINAL HYSTERECTOMY Bilateral 01/28/2015   Procedure: TOTAL SUPER CERVICAL ABDOMINAL HYSTERECTOMY BILATERAL SALPINGECTOMY;  Surgeon: Eldred Manges, MD;  Location: [facility];  Service: Gynecology;  Laterality: Bilateral;  . TUBAL LIGATION    . UPPER GI ENDOSCOPY  02/11/2014   Procedure: UPPER GI ENDOSCOPY;  Surgeon: Pedro Earls, MD;  Location: [facility];  Service: General;;    Family History  Problem Relation Age of Onset  . Hyperlipidemia Father   . Hypertension Father   . Diabetes Father   . Schizophrenia Father   . Diverticulitis Mother   . Colon cancer Maternal Grandmother   . Diabetes Paternal Grandmother   . Sudden death Neg Hx   . Heart attack Neg Hx     Social History   Socioeconomic History  . Marital status: Single    Spouse name: Not on file  . Number of children: 4  . Years of education: Not on file  . Highest education level: Not on file  Occupational History  . 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 Smoking status: Former Smoker    Types: Cigarettes    Quit date: 02/05/2006    Years since quitting: 14.4  . Smokeless tobacco: Never Used  Vaping Use  . Vaping Use: Never used  Substance and Sexual Activity  . Alcohol use: Yes    Comment: occ  . Drug use: No  . Sexual activity: Not on file  Other Topics Concern  . Not on file  Social History Narrative   Marital status: single      Children: four      Employment:  Mental health professional      Tobacco: none       Alcohol: wine per year.      Drugs: none      Exercise: stopped exercise one month ago.   Social Determinants of Health     Financial Resource Strain:   . Difficulty of Paying Living Expenses: Not on file  Food Insecurity:   . Worried About Charity fundraiser in the Last Year: Not on file  . Ran Out of Food in the Last Year: Not on file  Transportation Needs:   . Lack of Transportation (Medical): Not on file  . Lack of Transportation (Non-Medical): Not on file  Physical Activity:   . Days of Exercise per Week: Not on file  . Minutes of Exercise per Session: Not on file  Stress:   . Feeling of Stress : Not on file  Social Connections:   . Frequency of Communication with Friends and Family: Not on file  . Frequency of Social Gatherings with Friends and Family: Not on file  . Attends Religious Services: Not on file  . Active Member of Clubs or Organizations: Not on file  . Attends Archivist Meetings: Not on file  . Marital Status: Not on file  Intimate Partner Violence:   . Fear of Current or Ex-Partner: Not on file  . Emotionally Abused: Not on file  . Physically Abused: Not on file  . Sexually Abused: Not on file    Outpatient Medications Prior to Visit  Medication Sig Dispense Refill  . diclofenac Sodium (VOLTAREN) 1 % GEL Apply 2 g topically 4 (four) times daily. 100 g 1  . esomeprazole (NEXIUM) 20 MG capsule Take 1 capsule (20 mg total) by mouth daily at 12 noon. 30 capsule 5  . Multiple Minerals-Vitamins (CALCIUM & VIT D3 BONE HEALTH) LIQD Take 30 mLs by mouth daily.    . Multiple Vitamins-Minerals (MULTI-VITE) LIQD Take 30 mLs by mouth daily.    Marland Kitchen amoxicillin-clavulanate (AUGMENTIN) 875-125 MG tablet Take 1 tablet by mouth 2 (two) times daily. (Patient not taking: Reported on 07/07/2020) 14 tablet 0  . Cyanocobalamin (B-12 SL) Place 1 Dose under the tongue daily. (Patient not taking: Reported on 07/07/2020)     No facility-administered medications prior to visit.    No Known Allergies  ROS Review of Systems  Constitutional: Negative.   HENT: Negative.   Eyes: Negative.    Respiratory: Negative.   Cardiovascular: Negative.   Gastrointestinal: Negative.   Genitourinary: Negative.   Musculoskeletal: Negative.   Skin: Negative.   Neurological: Negative.   Psychiatric/Behavioral: Negative.       Objective:    Physical Exam Vitals and nursing note reviewed.  Constitutional:      General: She is not in acute distress.    Appearance: Normal appearance. She is normal weight. She is not ill-appearing, toxic-appearing or diaphoretic.  Cardiovascular:  Rate and Rhythm: Normal rate and regular rhythm.     Heart sounds: Normal heart sounds. No murmur heard.  No friction rub. No gallop.   Pulmonary:     Effort: Pulmonary effort is normal. No respiratory distress.     Breath sounds: Normal breath sounds. No stridor. No wheezing, rhonchi or rales.  Chest:     Chest wall: No tenderness.  Skin:    General: Skin is warm and dry.     Findings: Lesion (small, somewhat deep well circumscribed lesions - one on arm, one on calf on left side. likely lipoma) present.  Neurological:     General: No focal deficit present.     Mental Status: She is alert and oriented to person, place, and time. Mental status is at baseline.  Psychiatric:        Mood and Affect: Mood normal.        Behavior: Behavior normal.        Thought Content: Thought content normal.        Judgment: Judgment normal.     BP 132/83   Pulse 78   Temp 97.9 F (36.6 C) (Temporal)   Resp 18   Ht 5\' 9"  (1.753 m)   Wt (!) 337 lb (152.9 kg)   LMP 04/28/2014   SpO2 97%   BMI 49.77 kg/m  Wt Readings from Last 3 Encounters:  07/07/20 (!) 337 lb (152.9 kg)  06/02/20 (!) 332 lb (150.6 kg)  02/05/20 (!) 336 lb (152.4 kg)     Health Maintenance Due  Topic Date Due  . PAP SMEAR-Modifier  07/16/2016    There are no preventive care reminders to display for this patient.  Lab Results  Component Value Date   TSH 2.190 12/11/2019   Lab Results  Component Value Date   WBC 8.5 06/02/2020    HGB 13.6 06/02/2020   HCT 42.4 06/02/2020   MCV 84 06/02/2020   PLT 202 12/20/2017   Lab Results  Component Value Date   NA 139 06/02/2020   K 4.9 06/02/2020   CO2 27 06/02/2020   GLUCOSE 114 (H) 06/02/2020   BUN 13 06/02/2020   CREATININE 0.84 06/02/2020   BILITOT 0.2 12/11/2019   ALKPHOS 75 12/11/2019   AST 15 12/11/2019   ALT 15 12/11/2019   PROT 7.1 12/11/2019   ALBUMIN 4.1 12/11/2019   CALCIUM 9.8 06/02/2020   ANIONGAP 9 12/20/2017   Lab Results  Component Value Date   CHOL 178 12/11/2019   Lab Results  Component Value Date   HDL 76 12/11/2019   Lab Results  Component Value Date   LDLCALC 92 12/11/2019   Lab Results  Component Value Date   TRIG 53 12/11/2019   Lab Results  Component Value Date   CHOLHDL 2.3 12/11/2019   Lab Results  Component Value Date   HGBA1C 6.1 (A) 12/11/2019      Assessment & Plan:   Problem List Items Addressed This Visit    None    Visit Diagnoses    Lipoma of left lower extremity    -  Primary   BMI 45.0-49.9, adult ([place])       Relevant Orders   Amb Referral to Bariatric Surgery   Amb Ref to Medical Weight Management      No orders of the defined types were placed in this encounter.   Follow-up: No follow-ups on file.   PLAN  Will explore consults with bariatrics and healthy weight management groups.  Encourage patient to continue great work on diet and exercise  Lipomas benign, monitor for any changes, can consider derm referral for removal if desired  I spent 25 minutes with this patient, more than 50% of which was spent counseling/educating  Patient encouraged to call clinic with any questions, comments, or concerns.  Maximiano Coss, NP

## 2020-08-12 ENCOUNTER — Ambulatory Visit (INDEPENDENT_AMBULATORY_CARE_PROVIDER_SITE_OTHER): Payer: 59 | Admitting: Family Medicine

## 2020-08-18 ENCOUNTER — Encounter (INDEPENDENT_AMBULATORY_CARE_PROVIDER_SITE_OTHER): Payer: Self-pay | Admitting: Family Medicine

## 2020-08-18 ENCOUNTER — Ambulatory Visit (INDEPENDENT_AMBULATORY_CARE_PROVIDER_SITE_OTHER): Payer: 59 | Admitting: Family Medicine

## 2020-08-18 ENCOUNTER — Other Ambulatory Visit: Payer: Self-pay

## 2020-08-18 VITALS — BP 118/77 | HR 73 | Temp 98.4°F | Ht 68.0 in | Wt 335.0 lb

## 2020-08-18 DIAGNOSIS — Z8639 Personal history of other endocrine, nutritional and metabolic disease: Secondary | ICD-10-CM | POA: Diagnosis not present

## 2020-08-18 DIAGNOSIS — R0602 Shortness of breath: Secondary | ICD-10-CM

## 2020-08-18 DIAGNOSIS — K219 Gastro-esophageal reflux disease without esophagitis: Secondary | ICD-10-CM

## 2020-08-18 DIAGNOSIS — E038 Other specified hypothyroidism: Secondary | ICD-10-CM | POA: Insufficient documentation

## 2020-08-18 DIAGNOSIS — Z9884 Bariatric surgery status: Secondary | ICD-10-CM

## 2020-08-18 DIAGNOSIS — Z9189 Other specified personal risk factors, not elsewhere classified: Secondary | ICD-10-CM

## 2020-08-18 DIAGNOSIS — Z1331 Encounter for screening for depression: Secondary | ICD-10-CM

## 2020-08-18 DIAGNOSIS — I10 Essential (primary) hypertension: Secondary | ICD-10-CM

## 2020-08-18 DIAGNOSIS — R5383 Other fatigue: Secondary | ICD-10-CM

## 2020-08-18 DIAGNOSIS — Z0289 Encounter for other administrative examinations: Secondary | ICD-10-CM

## 2020-08-18 DIAGNOSIS — Z6841 Body Mass Index (BMI) 40.0 and over, adult: Secondary | ICD-10-CM

## 2020-08-18 HISTORY — DX: Other fatigue: R53.83

## 2020-08-18 HISTORY — DX: Other specified hypothyroidism: E03.8

## 2020-08-19 LAB — COMPREHENSIVE METABOLIC PANEL
ALT: 12 IU/L (ref 0–32)
AST: 14 IU/L (ref 0–40)
Albumin/Globulin Ratio: 1.3 (ref 1.2–2.2)
Albumin: 4 g/dL (ref 3.8–4.8)
Alkaline Phosphatase: 77 IU/L (ref 44–121)
BUN/Creatinine Ratio: 13 (ref 9–23)
BUN: 11 mg/dL (ref 6–24)
Bilirubin Total: 0.4 mg/dL (ref 0.0–1.2)
CO2: 25 mmol/L (ref 20–29)
Calcium: 9.1 mg/dL (ref 8.7–10.2)
Chloride: 103 mmol/L (ref 96–106)
Creatinine, Ser: 0.86 mg/dL (ref 0.57–1.00)
GFR calc Af Amer: 94 mL/min/{1.73_m2} (ref >59)
GFR calc non Af Amer: 81 mL/min/{1.73_m2} (ref >59)
Globulin, Total: 3.1 g/dL (ref 1.5–4.5)
Glucose: 96 mg/dL (ref 65–99)
Potassium: 4.5 mmol/L (ref 3.5–5.2)
Sodium: 141 mmol/L (ref 134–144)
Total Protein: 7.1 g/dL (ref 6.0–8.5)

## 2020-08-19 LAB — VITAMIN B12: Vitamin B-12: 1018 pg/mL (ref 232–1245)

## 2020-08-19 LAB — T4: T4, Total: 6.3 ug/dL (ref 4.5–12.0)

## 2020-08-19 LAB — T3: T3, Total: 88 ng/dL (ref 71–180)

## 2020-08-19 LAB — TSH: TSH: 1.77 u[IU]/mL (ref 0.450–4.500)

## 2020-08-19 LAB — INSULIN, RANDOM: INSULIN: 8.6 u[IU]/mL (ref 2.6–24.9)

## 2020-08-19 LAB — LIPID PANEL
Chol/HDL Ratio: 2.4 ratio (ref 0.0–4.4)
Cholesterol, Total: 181 mg/dL (ref 100–199)
HDL: 76 mg/dL (ref >39)
LDL Chol Calc (NIH): 94 mg/dL (ref 0–99)
Triglycerides: 54 mg/dL (ref 0–149)
VLDL Cholesterol Cal: 11 mg/dL (ref 5–40)

## 2020-08-19 LAB — FOLATE: Folate: 10.6 ng/mL (ref >3.0)

## 2020-08-19 LAB — VITAMIN D 25 HYDROXY (VIT D DEFICIENCY, FRACTURES): Vit D, 25-Hydroxy: 26.9 ng/mL — ABNORMAL LOW (ref 30.0–100.0)

## 2020-08-19 LAB — HEMOGLOBIN A1C
Est. average glucose Bld gHb Est-mCnc: 134 mg/dL
Hgb A1c MFr Bld: 6.3 % — ABNORMAL HIGH (ref 4.8–5.6)

## 2020-08-20 NOTE — Progress Notes (Signed)
Dear , Haley Coss, NP,  Thank you for referring Haley Wyatt to our clinic. The following note includes my evaluation and treatment recommendations.  Chief Complaint:   OBESITY [name] (MR# [id number]) is a 44 y.o. female who presents for evaluation and treatment of obesity and related comorbidities. Current BMI is Body mass index is 50.94 kg/m. Haley Wyatt has been struggling with her weight for many years and has been unsuccessful in either losing weight, maintaining weight loss, or reaching her healthy weight goal.  Haley Wyatt is currently in the action stage of change and ready to dedicate time achieving and maintaining a healthier weight. Haley Wyatt is interested in becoming our patient and working on intensive lifestyle modifications including (but not limited to) diet and exercise for weight loss.  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  Drinks a lot of caloric beverages.  Her PCP is at [facility].  Haley Wyatt's habits were reviewed today and are as follows: Her family eats meals together, she thinks her family will eat healthier with her, she has been heavy most of her life, she started gaining weight after having children, her heaviest weight ever was 396 pounds, she craves chips and fried fish, she snacks frequently in the evenings, she skips breakfast and dinner frequently, she is frequently drinking liquids with calories and she struggles with emotional eating.  This is the patient's first visit at [facility].  The patient's NEW PATIENT PACKET that they filled out prior to today's office visit was reviewed at length and information from that paperwork was included within the following office visit note.    Included in the packet: current and past health history, medications, allergies, ROS, gynecologic history (women only), surgical history, family history, social  history, weight history, weight loss surgery history (for those that have had weight loss surgery), nutritional evaluation, mood and food questionnaire along with a depression screening (PHQ9) on all patients, an Epworth questionnaire, sleep habits questionnaire, patient life and health improvement goals questionnaire. These will all be scanned into the patient's chart under media.   During the visit, I independently reviewed the patient's EKG, bioimpedance scale results, and indirect calorimeter results. I used this information to tailor a meal plan for the patient that will help Haley Wyatt to lose weight and will improve her obesity-related conditions going forward.  I performed a medically necessary appropriate examination and/or evaluation. I discussed the assessment and treatment plan with the patient. The patient was provided an opportunity to ask questions and all were answered. The patient agreed with the plan and demonstrated an understanding of the instructions. Labs were ordered today (unless patient declined them) and will be reviewed with the patient at our next visit unless more critical results need to be addressed immediately. Clinical information was updated and documented in the EMR.  Time spent on visit including pre-visit chart review and post-visit care was estimated to be 60-74 minutes.  A separate 15 minutes was spent on risk counseling (see above/below).   Depression Screen Haley Wyatt's Food and Mood (modified PHQ-9) score was 10.  Depression screen PHQ 2/9 08/18/2020  Decreased Interest 1  Down, Depressed, Hopeless 0  PHQ - 2 Score 1  Altered sleeping 1  Tired, decreased energy 2  Change in appetite 2  Feeling bad or failure about yourself  0  Trouble concentrating 3  Moving slowly or fidgety/restless 1  Suicidal thoughts 0  PHQ-9 Score 10  Difficult doing work/chores Very difficult  Some encounter information is confidential and restricted. Go to Review Flowsheets activity to  see all data.   Assessment/Plan:   1. Other fatigue Haley Wyatt denies daytime somnolence and denies waking up still tired. Patent has a history of symptoms of snoring. Haley Wyatt generally gets 6 hours of sleep per night, and states that she has generally restful sleep. Snoring is present. Apneic episodes are not present. Epworth Sleepiness Score is 4.  Haley Wyatt does feel that her weight is causing her energy to be lower than it should be. Fatigue may be related to obesity, depression or many other causes. Labs will be ordered, and in the meanwhile, Haley Wyatt will focus on self care including making healthy food choices, increasing physical activity and focusing on stress reduction.  - EKG 12-Lead - Vitamin B12 - Comprehensive metabolic panel - Folate - Hemoglobin A1c - Insulin, random - Lipid panel - T3 - T4 - TSH - VITAMIN D 25 Hydroxy (Vit-D Deficiency, Fractures)  2. SOB (shortness of breath) on exertion Haley Wyatt notes increasing shortness of breath with exercising and seems to be worsening over time with weight gain. She notes getting out of breath sooner with activity than she used to. This has gotten worse recently. Haley Wyatt denies shortness of breath at rest or orthopnea.  Haley Wyatt does feel that she gets out of breath more easily that she used to when she exercises. Haley Wyatt's shortness of breath appears to be obesity related and exercise induced. She has agreed to work on weight loss and gradually increase exercise to treat her exercise induced shortness of breath. Will continue to monitor closely.  - Vitamin B12 - Comprehensive metabolic panel - Folate - Hemoglobin A1c - Insulin, random - Lipid panel - T3 - T4 - TSH - VITAMIN D 25 Hydroxy (Vit-D Deficiency, Fractures)  3. History of hypothyroidism Haley Wyatt used to be on medication in the past, but was told she did not need medications any longer at a follow-up visit.  Plan:  Check labs today.  Lab Results  Component Value Date   TSH  1.770 08/18/2020   - Vitamin B12 - Comprehensive metabolic panel - Folate - Hemoglobin A1c - Insulin, random - Lipid panel - T3 - T4 - TSH - VITAMIN D 25 Hydroxy (Vit-D Deficiency, Fractures)  4. Essential hypertension Blood pressure is diet controlled since she had her gastric sleeve surgery.  Plan:  Check labs today.  BP Readings from Last 3 Encounters:  08/18/20 118/77  07/07/20 132/83  06/02/20 138/84   - Vitamin B12 - Comprehensive metabolic panel - Folate - Hemoglobin A1c - Insulin, random - Lipid panel - T3 - T4 - TSH - VITAMIN D 25 Hydroxy (Vit-D Deficiency, Fractures)  5. S/P laparoscopic sleeve gastrectomy Cortlyn had surgery in 01/2014, with Dr. Hassell Done in [place].  Lowest weight after surgery was 279 pounds.  Arwilda is at risk for malnutrition due to her previous bariatric surgery.   Counseling  You may need to eat 3 meals and 2 snacks, or 5 small meals each day in order to reach your protein and calorie goals.   Allow at least 15 minutes for each meal so that you can eat mindfully. Listen to your body so that you do not overeat. For most people, your sleeve or pouch will comfortably hold 4-6 ounces.  Eat foods from all food groups. This includes fruits and vegetables, grains, dairy, and meat and other proteins.  Include a protein-rich food at every  meal and snack, and eat the protein food first.   You should be taking a Bariatric Multivitamin as well as calcium.   6. Gastroesophageal reflux disease, unspecified whether esophagitis present Kaelene takes Nexium 20 mg daily for GERD.  No side effects of medication.  Plan:  Check labs today.  Avoid triggering foods.  Intensive lifestyle modifications are the first line treatment for this issue. We discussed several lifestyle modifications today and she will continue to work on diet, exercise and weight loss efforts. Orders and follow up as documented in patient record.   Counseling . If a person has  gastroesophageal reflux disease (GERD), food and stomach acid move back up into the esophagus and cause symptoms or problems such as damage to the esophagus. . Anti-reflux measures include: raising the head of the bed, avoiding tight clothing or belts, avoiding eating late at night, not lying down shortly after mealtime, and achieving weight loss. . Avoid ASA, NSAID's, caffeine, alcohol, and tobacco.  . OTC Pepcid and/or Tums are often very helpful for as needed use.  Marland Kitchen However, for persisting chronic or daily symptoms, stronger medications like Omeprazole may be needed. . You may need to avoid foods and drinks such as: ? Coffee and tea (with or without caffeine). ? Drinks that contain alcohol. ? Energy drinks and sports drinks. ? Bubbly (carbonated) drinks or sodas. ? Chocolate and cocoa. ? Peppermint and mint flavorings. ? Garlic and onions. ? Horseradish. ? Spicy and acidic foods. These include peppers, chili powder, curry powder, vinegar, hot sauces, and BBQ sauce. ? Citrus fruit juices and citrus fruits, such as oranges, lemons, and limes. ? Tomato-based foods. These include red sauce, chili, salsa, and pizza with red sauce. ? Fried and fatty foods. These include donuts, french fries, potato chips, and high-fat dressings. ? High-fat meats. These include hot dogs, rib eye steak, sausage, ham, and bacon.  - Comprehensive metabolic panel  7. Depression screening Stepanie was screened for depression as part of her new patient workup today.  PHQ-9 is 10.  Tenlee had a positive depression screening. Depression is commonly associated with obesity and often results in emotional eating behaviors. We will monitor this closely and work on CBT to help improve the non-hunger eating patterns. Referral to Psychology may be required if no improvement is seen as she continues in our clinic.  8. At risk for heart disease Due to Shine's current state of health and medical condition(s), she is at a  higher risk for heart disease.   This puts the patient at much greater risk to subsequently develop cardiopulmonary conditions that can significantly affect patient's quality of life in a negative manner as well.    At least 30 minutes was spent on counseling Laisa about these concerns today and I stressed the importance of reversing risks factors of obesity, esp truncal and visceral fat, hypertension, hyperlipidemia, pre-diabetes.   Initial goal is to lose at least 5-10% of starting weight to help reduce these risk factors.   Counseling: Intensive lifestyle modifications discussed with [facility] as most appropriate first line treatment.  she will continue to work on diet, exercise and weight loss efforts.  We will continue to reassess these conditions on a fairly regular basis in an attempt to decrease patient's overall morbidity and mortality.  Evidence-based interventions for health behavior change were utilized today including the discussion of self monitoring techniques, problem-solving barriers and SMART goal setting techniques.  Specifically regarding patient's less desirable eating habits and patterns, we employed the  technique of small changes when Nakyra has not been able to fully commit to her prudent nutritional plan.  9. Class 3 severe obesity with serious comorbidity and body mass index (BMI) of 50.0 to 59.9 in adult, unspecified obesity type [facility])  Catricia is currently in the action stage of change and her goal is to continue with weight loss efforts. I recommend Jariana begin the structured treatment plan as follows:  She has agreed to the Category 2 Plan she states she cannot eat all the food on Category 3, so we will go with Category 2 for now..  Exercise goals: No exercise has been prescribed at this time.   Behavioral modification strategies: increasing lean protein intake, decreasing simple carbohydrates, no skipping meals, better snacking choices and planning for success.  She was  informed of the importance of frequent follow-up visits to maximize her success with intensive lifestyle modifications for her multiple health conditions. She was informed we would discuss her lab results at her next visit unless there is a critical issue that needs to be addressed sooner. Barrett agreed to keep her next visit at the agreed upon time to discuss these results.  Objective:   Blood pressure 118/77, pulse 73, temperature 98.4 F (36.9 C), height 5\' 8"  (1.727 m), weight (!) 335 lb (152 kg), last menstrual period 04/28/2014, SpO2 98 %. Body mass index is 50.94 kg/m.  EKG: Normal sinus rhythm, rate 69 bpm.  Indirect Calorimeter completed today shows a VO2 of 384 and a REE of 2671.  Her calculated basal metabolic rate is 7893 thus her basal metabolic rate is better than expected.  General: Cooperative, alert, well developed, in no acute distress. HEENT: Conjunctivae and lids unremarkable. Cardiovascular: Regular rhythm.  Lungs: Normal work of breathing. Neurologic: No focal deficits.   Lab Results  Component Value Date   CREATININE 0.86 08/18/2020   BUN 11 08/18/2020   NA 141 08/18/2020   K 4.5 08/18/2020   CL 103 08/18/2020   CO2 25 08/18/2020   Lab Results  Component Value Date   ALT 12 08/18/2020   AST 14 08/18/2020   ALKPHOS 77 08/18/2020   BILITOT 0.4 08/18/2020   Lab Results  Component Value Date   HGBA1C 6.3 (H) 08/18/2020   HGBA1C 6.1 (A) 12/11/2019   HGBA1C 6.2 (H) 05/09/2013   Lab Results  Component Value Date   INSULIN 8.6 08/18/2020   Lab Results  Component Value Date   TSH 1.770 08/18/2020   Lab Results  Component Value Date   CHOL 181 08/18/2020   HDL 76 08/18/2020   LDLCALC 94 08/18/2020   TRIG 54 08/18/2020   CHOLHDL 2.4 08/18/2020   Lab Results  Component Value Date   WBC 8.5 06/02/2020   HGB 13.6 06/02/2020   HCT 42.4 06/02/2020   MCV 84 06/02/2020   PLT 202 12/20/2017   Attestation Statements:   Reviewed by clinician on day  of visit: allergies, medications, problem list, medical history, surgical history, family history, social history, and previous encounter notes.  I, Water quality scientist, CMA, am acting as Location manager for [employer], DO.  I have reviewed the above documentation for accuracy and completeness, and I agree with the above. Marjory Sneddon, D.O.  The [place] was signed into law in 2016 which includes the topic of electronic health records.  This provides immediate access to information in MyChart.  This includes consultation notes, operative notes, office notes, lab results and pathology reports.  If you have any questions about what you read please let us know at your next visit so we can discuss your concerns and take corrective action if need be.  We are right here with you.

## 2020-08-26 ENCOUNTER — Ambulatory Visit (INDEPENDENT_AMBULATORY_CARE_PROVIDER_SITE_OTHER): Payer: 59 | Admitting: Family Medicine

## 2020-09-01 ENCOUNTER — Ambulatory Visit (INDEPENDENT_AMBULATORY_CARE_PROVIDER_SITE_OTHER): Payer: 59 | Admitting: Family Medicine

## 2020-12-16 ENCOUNTER — Encounter (HOSPITAL_COMMUNITY): Payer: Self-pay | Admitting: Emergency Medicine

## 2020-12-16 ENCOUNTER — Ambulatory Visit (HOSPITAL_COMMUNITY)
Admission: EM | Admit: 2020-12-16 | Discharge: 2020-12-16 | Disposition: A | Discharge status: Home / Self Care | Payer: 59 | Attending: Family Medicine | Admitting: Family Medicine

## 2020-12-16 ENCOUNTER — Other Ambulatory Visit: Payer: Self-pay

## 2020-12-16 ENCOUNTER — Ambulatory Visit (INDEPENDENT_AMBULATORY_CARE_PROVIDER_SITE_OTHER): Payer: 59

## 2020-12-16 DIAGNOSIS — M79644 Pain in right finger(s): Secondary | ICD-10-CM

## 2020-12-16 DIAGNOSIS — S6991XA Unspecified injury of right wrist, hand and finger(s), initial encounter: Secondary | ICD-10-CM

## 2020-12-16 NOTE — ED Triage Notes (Signed)
Right thumb injury that occurred one week ago.  Patient hit thumb on a corner of a piece of furniture.  Patient can move thumb, but it is not smooth in motion.  Thumb hurts if she tries to use thumb with any purposeful movement

## 2020-12-19 NOTE — ED Provider Notes (Signed)
[place]   [id number] 12/16/20 Arrival Time: 1935  ASSESSMENT & PLAN:  1. Thumb pain, right     I have personally viewed the imaging studies ordered this visit. No fx appreciated on thumb films.   Prefers OTC analgesics as needed. Placed in thumb spica.  Recommend:  Follow-up Information    Schedule an appointment as soon as possible for a visit  with Iran Planas, MD.   Specialty: Orthopedic Surgery Contact information: [address] [place] [address] [phone]               Reviewed expectations re: course of current medical issues. Questions answered. Outlined signs and symptoms indicating need for more acute intervention. Patient verbalized understanding. After Visit Summary given.  SUBJECTIVE: History from: patient. Haley Wyatt is a 45 y.o. female who reports R thumb pain; s/p hitting thumb on piece of furniture a week ago. "Still sore". No ROM limitations. No extremity sensation changes or weakness. No OTC tx reported. Pain worse when gripping.    Past Surgical History:  Procedure Laterality Date  . ABDOMINAL HYSTERECTOMY    . BILATERAL SALPINGECTOMY Bilateral 01/28/2015   Procedure: BILATERAL SALPINGECTOMY;  Surgeon: Eldred Manges, MD;  Location: [facility];  Service: Gynecology;  Laterality: Bilateral;  . CARPAL TUNNEL RELEASE Right   . DILITATION & CURRETTAGE/HYSTROSCOPY WITH HYDROTHERMAL ABLATION N/A 04/16/2014   Procedure: DILATATION & CURETTAGE/HYSTEROSCOPY WITH HYDROTHERMAL ABLATION;  Surgeon: Frederico Hamman, MD;  Location: [facility];  Service: Gynecology;  Laterality: N/A;  . HIATAL HERNIA REPAIR N/A 02/11/2014   Procedure: LAPAROSCOPIC REPAIR OF HIATAL HERNIA;  Surgeon: Pedro Earls, MD;  Location: [facility];  Service: General;  Laterality: N/A;  . LAPAROSCOPIC GASTRIC SLEEVE RESECTION N/A 02/11/2014   Procedure: LAPAROSCOPIC GASTRIC SLEEVE RESECTION;  Surgeon: Pedro Earls, MD;  Location: [facility];  Service:  General;  Laterality: N/A;  . SHOULDER SURGERY     left shoulder loose body removal  . SUPRACERVICAL ABDOMINAL HYSTERECTOMY Bilateral 01/28/2015   Procedure: TOTAL SUPER CERVICAL ABDOMINAL HYSTERECTOMY BILATERAL SALPINGECTOMY;  Surgeon: Eldred Manges, MD;  Location: [facility];  Service: Gynecology;  Laterality: Bilateral;  . TUBAL LIGATION    . UPPER GI ENDOSCOPY  02/11/2014   Procedure: UPPER GI ENDOSCOPY;  Surgeon: Pedro Earls, MD;  Location: [facility];  Service: General;;      OBJECTIVE:  Vitals:   12/16/20 1955  BP: (!) 148/96  Pulse: 68  Resp: 18  Temp: 99.7 F (37.6 C)  TempSrc: Oral  SpO2: 95%    General appearance: alert; no distress HEENT: [place]; AT Neck: supple with FROM Resp: unlabored respirations Extremities: . RUE: warm with well perfused appearance; poorly localized moderate tenderness over right proximal thumb; without gross deformities; swelling: none; bruising: none; thumb ROM: normal CV: brisk extremity capillary refill of RUE; 2+ radial pulse of RUE. Skin: warm and dry; no visible rashes Neurologic: gait normal; normal sensation and strength of RUE Psychological: alert and cooperative; normal mood and affect  Imaging: DG Finger Thumb Right  Result Date: 12/16/2020 CLINICAL DATA:  Hand trauma 1 week ago with thumb pain EXAM: RIGHT THUMB 2+V COMPARISON:  None. FINDINGS: There is no evidence of fracture or dislocation. There is no evidence of arthropathy or other focal bone abnormality. Soft tissues are unremarkable. IMPRESSION: Normal radiographs. Electronically Signed   By: Nelson Chimes M.D.   On: 12/16/2020 20:05     No Known Allergies  Past Medical History:  Diagnosis Date  .  ADD (attention deficit disorder)   . ADHD   . Allergy   . Back pain    "muscle strain from exercising"  . Back pain   . Borderline diabetes    states "levels have been about 102"  . Chest pain   . Chronic headaches   . Constipation   . Fibroid tumor    inside uterus  .  GERD (gastroesophageal reflux disease)    PT STATES "DOES NOT HAVE IT NOW"  . H/O hiatal hernia   . Hypertension    NO MEDS.. BP CONTROLLED  . Hypothyroidism   . Joint pain   . SOB (shortness of breath) on exertion   . Swelling of both lower extremities    Social History   Socioeconomic History  . Marital status: Single    Spouse name: Not on file  . Number of children: 4  . Years of education: Not on file  . Highest education level: Not on file  Occupational History  . 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 Smoking status: Former Smoker    Types: Cigarettes    Quit date: 02/05/2006    Years since quitting: 14.8  . Smokeless tobacco: Never Used  Vaping Use  . Vaping Use: Never used  Substance and Sexual Activity  . Alcohol use: Yes    Comment: occ  . Drug use: No  . Sexual activity: Not on file  Other Topics Concern  . Not on file  Social History Narrative   Marital status: single      Children: four      Employment:  Mental health professional      Tobacco: none       Alcohol: wine per year.      Drugs: none      Exercise: stopped exercise one month ago.   Social Determinants of Health   Financial Resource Strain: Not on file  Food Insecurity: Not on file  Transportation Needs: Not on file  Physical Activity: Not on file  Stress: Not on file  Social Connections: Not on file   Family History  Problem Relation Age of Onset  . Hyperlipidemia Father   . Hypertension Father   . Diabetes Father   . Schizophrenia Father   . Depression Father   . Diverticulitis Mother   . Alcoholism Mother   . Colon cancer Maternal Grandmother   . Diabetes Paternal Grandmother   . Sudden death Neg Hx   . Heart attack Neg Hx    Past Surgical History:  Procedure Laterality Date  . ABDOMINAL HYSTERECTOMY    . BILATERAL SALPINGECTOMY Bilateral 01/28/2015   Procedure: BILATERAL SALPINGECTOMY;  Surgeon: Eldred Manges, MD;  Location:  [facility];  Service: Gynecology;  Laterality: Bilateral;  . CARPAL TUNNEL RELEASE Right   . DILITATION & CURRETTAGE/HYSTROSCOPY WITH HYDROTHERMAL ABLATION N/A 04/16/2014   Procedure: DILATATION & CURETTAGE/HYSTEROSCOPY WITH HYDROTHERMAL ABLATION;  Surgeon: Frederico Hamman, MD;  Location: [facility];  Service: Gynecology;  Laterality: N/A;  . HIATAL HERNIA REPAIR N/A 02/11/2014   Procedure: LAPAROSCOPIC REPAIR OF HIATAL HERNIA;  Surgeon: Pedro Earls, MD;  Location: [facility];  Service: General;  Laterality: N/A;  . LAPAROSCOPIC GASTRIC SLEEVE RESECTION N/A 02/11/2014   Procedure: LAPAROSCOPIC GASTRIC SLEEVE RESECTION;  Surgeon: Pedro Earls, MD;  Location: [facility];  Service: General;  Laterality: N/A;  . SHOULDER SURGERY     left shoulder loose body removal  . SUPRACERVICAL ABDOMINAL  HYSTERECTOMY Bilateral 01/28/2015   Procedure: TOTAL SUPER CERVICAL ABDOMINAL HYSTERECTOMY BILATERAL SALPINGECTOMY;  Surgeon: Eldred Manges, MD;  Location: [facility];  Service: Gynecology;  Laterality: Bilateral;  . TUBAL LIGATION    . UPPER GI ENDOSCOPY  02/11/2014   Procedure: UPPER GI ENDOSCOPY;  Surgeon: Pedro Earls, MD;  Location: [facility];  Service: General;;      Vanessa Kick, MD 12/19/20 [phone]

## 2021-03-05 ENCOUNTER — Other Ambulatory Visit: Payer: Self-pay

## 2021-03-05 ENCOUNTER — Ambulatory Visit (INDEPENDENT_AMBULATORY_CARE_PROVIDER_SITE_OTHER): Payer: 59 | Admitting: Registered Nurse

## 2021-03-05 ENCOUNTER — Encounter: Payer: Self-pay | Admitting: Registered Nurse

## 2021-03-05 VITALS — HR 89 | Temp 98.0°F | Resp 18 | Ht 68.0 in | Wt 340.2 lb

## 2021-03-05 DIAGNOSIS — J029 Acute pharyngitis, unspecified: Secondary | ICD-10-CM

## 2021-03-05 MED ORDER — AMOXICILLIN-POT CLAVULANATE 875-125 MG PO TABS: 1.0000 | ORAL_TABLET | Freq: Two times a day (BID) | ORAL | 0 refills | Status: DC

## 2021-03-05 MED ORDER — PREDNISONE 10 MG (21) PO TBPK
ORAL_TABLET | ORAL | 0 refills | Status: DC
Start: 2021-03-05 — End: 2021-05-21

## 2021-03-05 NOTE — Progress Notes (Signed)
Acute Office Visit  Subjective:    Patient ID: Haley Wyatt, female    DOB: 02-Jan-1976, 45 y.o.   MRN: [id number]  Chief Complaint  Patient presents with  . Conjunctivitis    Patient states she has been experiencing swollen lymph nodes ,red eye and a boil and bumps all over. Patient states she took a covid test and it was negative. She thinks its because she recently got the covid vaccine.    HPI Patient is in today for eye pain, cyst, ear pain, lymphadenopathy  Eye pain Redness and mild pain in L eye Ongoing for a little less than 1 week Starting to have some bumps around eye, mostly medially on bridge of nose.  Reports pain is deep, not superficial.  No drainage or crusting. No change to visual acuity  Cyst Midline anterior trunk, about 3-4 inches below sternal notch. Firm, mild discomfort Drains when applying pressure.  Does not seem to be spreading No redness or rash No other spots like this.  Ear pain Pressure type pain R>L Local lymphadenopathy on and off x 1 week Feels somewhat similar to recent hx of infectious mononucleosis No drainage from ear, no change to hearing, no rash  Bruising Mild bruises across both lower legs Does not recall any trauma Not painful or raised Has not happened before No shob, doe, fatigue, redness, warmth, or other changes of concern  Past Medical History:  Diagnosis Date  . ADD (attention deficit disorder)   . ADHD   . Allergy   . Back pain    "muscle strain from exercising"  . Back pain   . Borderline diabetes    states "levels have been about 102"  . Chest pain   . Chronic headaches   . Constipation   . Fibroid tumor    inside uterus  . GERD (gastroesophageal reflux disease)    PT STATES "DOES NOT HAVE IT NOW"  . H/O hiatal hernia   . Hypertension    NO MEDS.. BP CONTROLLED  . Hypothyroidism   . Joint pain   . SOB (shortness of breath) on exertion   . Swelling of both lower extremities     Past Surgical  History:  Procedure Laterality Date  . ABDOMINAL HYSTERECTOMY    . BILATERAL SALPINGECTOMY Bilateral 01/28/2015   Procedure: BILATERAL SALPINGECTOMY;  Surgeon: Eldred Manges, MD;  Location: [facility];  Service: Gynecology;  Laterality: Bilateral;  . CARPAL TUNNEL RELEASE Right   . DILITATION & CURRETTAGE/HYSTROSCOPY WITH HYDROTHERMAL ABLATION N/A 04/16/2014   Procedure: DILATATION & CURETTAGE/HYSTEROSCOPY WITH HYDROTHERMAL ABLATION;  Surgeon: Frederico Hamman, MD;  Location: [facility];  Service: Gynecology;  Laterality: N/A;  . HIATAL HERNIA REPAIR N/A 02/11/2014   Procedure: LAPAROSCOPIC REPAIR OF HIATAL HERNIA;  Surgeon: Pedro Earls, MD;  Location: [facility];  Service: General;  Laterality: N/A;  . LAPAROSCOPIC GASTRIC SLEEVE RESECTION N/A 02/11/2014   Procedure: LAPAROSCOPIC GASTRIC SLEEVE RESECTION;  Surgeon: Pedro Earls, MD;  Location: [facility];  Service: General;  Laterality: N/A;  . SHOULDER SURGERY     left shoulder loose body removal  . SUPRACERVICAL ABDOMINAL HYSTERECTOMY Bilateral 01/28/2015   Procedure: TOTAL SUPER CERVICAL ABDOMINAL HYSTERECTOMY BILATERAL SALPINGECTOMY;  Surgeon: Eldred Manges, MD;  Location: [facility];  Service: Gynecology;  Laterality: Bilateral;  . TUBAL LIGATION    . UPPER GI ENDOSCOPY  02/11/2014   Procedure: UPPER GI ENDOSCOPY;  Surgeon: Pedro Earls, MD;  Location: [facility];  Service: General;;  Family History  Problem Relation Age of Onset  . Hyperlipidemia Father   . Hypertension Father   . Diabetes Father   . Schizophrenia Father   . Depression Father   . Diverticulitis Mother   . Alcoholism Mother   . Colon cancer Maternal Grandmother   . Diabetes Paternal Grandmother   . Sudden death Neg Hx   . Heart attack Neg Hx     Social History   Socioeconomic History  . Marital status: Single    Spouse name: Not on file  . Number of children: 4  . Years of education: Not on file  . Highest education level: Not on file  Occupational History   . 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 Smoking status: Former Smoker    Types: Cigarettes    Quit date: 02/05/2006    Years since quitting: 15.0  . Smokeless tobacco: Never Used  Vaping Use  . Vaping Use: Never used  Substance and Sexual Activity  . Alcohol use: Yes    Comment: occ  . Drug use: No  . Sexual activity: Not on file  Other Topics Concern  . Not on file  Social History Narrative   Marital status: single      Children: four      Employment:  Mental health professional      Tobacco: none       Alcohol: wine per year.      Drugs: none      Exercise: stopped exercise one month ago.   Social Determinants of Health   Financial Resource Strain: Not on file  Food Insecurity: Not on file  Transportation Needs: Not on file  Physical Activity: Not on file  Stress: Not on file  Social Connections: Not on file  Intimate Partner Violence: Not on file    Outpatient Medications Prior to Visit  Medication Sig Dispense Refill  . Multiple Minerals-Vitamins (CALCIUM & VIT D3 BONE HEALTH) LIQD Take 30 mLs by mouth daily.    . Multiple Vitamins-Minerals (MULTI-VITE) LIQD Take 30 mLs by mouth daily.    . Cyanocobalamin (B-12 SL) Place 1 Dose under the tongue daily.  (Patient not taking: Reported on 03/05/2021)    . diclofenac Sodium (VOLTAREN) 1 % GEL Apply 2 g topically 4 (four) times daily. (Patient not taking: Reported on 03/05/2021) 100 g 1  . esomeprazole (NEXIUM) 20 MG capsule Take 1 capsule (20 mg total) by mouth daily at 12 noon. (Patient not taking: Reported on 03/05/2021) 30 capsule 5   No facility-administered medications prior to visit.    No Known Allergies  Review of Systems Per hpi      Objective:    Physical Exam Vitals and nursing note reviewed.  Constitutional:      General: She is not in acute distress.    Appearance: Normal appearance. She is not ill-appearing, toxic-appearing or diaphoretic.  HENT:      Head: Normocephalic and atraumatic.     Right Ear: Tympanic membrane, ear canal and external ear normal.     Left Ear: Tympanic membrane, ear canal and external ear normal.  Cardiovascular:     Rate and Rhythm: Normal rate and regular rhythm.     Pulses: Normal pulses.     Heart sounds: Normal heart sounds. No murmur heard. No friction rub. No gallop.   Pulmonary:     Effort: Pulmonary effort is normal. No respiratory distress.     Breath sounds:  Normal breath sounds. No stridor. No wheezing, rhonchi or rales.  Chest:     Chest wall: No tenderness.  Lymphadenopathy:     Cervical: Cervical adenopathy (mild, R>L) present.  Skin:    General: Skin is warm and dry.     Capillary Refill: Capillary refill takes less than 2 seconds.     Findings: Bruising (mild bruising on lower legs) and lesion (appears as sebaceous cyst midline chest) present.  Neurological:     General: No focal deficit present.     Mental Status: She is alert and oriented to person, place, and time. Mental status is at baseline.  Psychiatric:        Mood and Affect: Mood normal.        Behavior: Behavior normal.        Thought Content: Thought content normal.        Judgment: Judgment normal.     Pulse 89   Temp 98 F (36.7 C) (Temporal)   Resp 18   Ht 5\' 8"  (1.727 m)   Wt (!) 340 lb 3.2 oz (154.3 kg)   LMP 04/28/2014   SpO2 99%   BMI 51.73 kg/m  Wt Readings from Last 3 Encounters:  03/05/21 (!) 340 lb 3.2 oz (154.3 kg)  08/18/20 (!) 335 lb (152 kg)  07/07/20 (!) 337 lb (152.9 kg)    There are no preventive care reminders to display for this patient.  There are no preventive care reminders to display for this patient.   Lab Results  Component Value Date   TSH 1.770 08/18/2020   Lab Results  Component Value Date   WBC 8.5 06/02/2020   HGB 13.6 06/02/2020   HCT 42.4 06/02/2020   MCV 84 06/02/2020   PLT 202 12/20/2017   Lab Results  Component Value Date   NA 141 08/18/2020   K 4.5 08/18/2020    CO2 25 08/18/2020   GLUCOSE 96 08/18/2020   BUN 11 08/18/2020   CREATININE 0.86 08/18/2020   BILITOT 0.4 08/18/2020   ALKPHOS 77 08/18/2020   AST 14 08/18/2020   ALT 12 08/18/2020   PROT 7.1 08/18/2020   ALBUMIN 4.0 08/18/2020   CALCIUM 9.1 08/18/2020   ANIONGAP 9 12/20/2017   Lab Results  Component Value Date   CHOL 181 08/18/2020   Lab Results  Component Value Date   HDL 76 08/18/2020   Lab Results  Component Value Date   LDLCALC 94 08/18/2020   Lab Results  Component Value Date   TRIG 54 08/18/2020   Lab Results  Component Value Date   CHOLHDL 2.4 08/18/2020   Lab Results  Component Value Date   HGBA1C 6.3 (H) 08/18/2020       Assessment & Plan:   Problem List Items Addressed This Visit   None   Visit Diagnoses    Acute pharyngitis, unspecified etiology    -  Primary   Relevant Medications   amoxicillin-clavulanate (AUGMENTIN) 875-125 MG tablet   predniSONE (STERAPRED UNI-PAK 21 TAB) 10 MG (21) TBPK tablet       Meds ordered this encounter  Medications  . amoxicillin-clavulanate (AUGMENTIN) 875-125 MG tablet    Sig: Take 1 tablet by mouth 2 (two) times daily.    Dispense:  14 tablet    Refill:  0    Order Specific Question:   Supervising Provider    Answer:   Carlota Raspberry, JEFFREY R [[id number]]  . predniSONE (STERAPRED UNI-PAK 21 TAB) 10 MG (21) TBPK tablet  Sig: Take per package instructions. Do not skip doses. Finish entire supply.    Dispense:  1 each    Refill:  0    Order Specific Question:   Supervising Provider    Answer:   Carlota Raspberry, JEFFREY R [[id number]]   PLAN  Concern with bumps around the eye that there is potential for preseptal cellulitis. Start augmentin course today. Close ER precautions.  Prednisone for suspected viral URI  augmentin will likely help with cyst on chest. Warm compress and close hygiene. Can continue to apply pressure.   Bruising on legs is of some concern, but close watching. No shob, doe, hx of clot, warmth or redness  is reassuring.   Return to clinic if worsening or failing to improve in 1-2 weeks  Patient encouraged to call clinic with any questions, comments, or concerns.   Maximiano Coss, NP

## 2021-03-05 NOTE — Patient Instructions (Addendum)
Ms. Neyer -  Sorry you are not feeling well! I think a course of antibiotics and prednisone will help. If it doesn't, let me know and we can plan next steps.  There is an outside chance that this is a recurrence of the past viral infection, though I somewhat doubt this.  Keep the area on your chest clean and dry to help it heal.  Otherwise, rest up and feel better soon!  Thank you  Rich     If you have lab work done today you will be contacted with your lab results within the next 2 weeks.  If you have not heard from [place] then please contact us. The fastest way to get your results is to register for My Chart.   IF you received an x-ray today, you will receive an invoice from [facility]. Please contact [facility] at [phone] with questions or concerns regarding your invoice.   IF you received labwork today, you will receive an invoice from [facility]. Please contact [facility] at [phone] with questions or concerns regarding your invoice.   Our billing staff will not be able to assist you with questions regarding bills from these companies.  You will be contacted with the lab results as soon as they are available. The fastest way to get your results is to activate your My Chart account. Instructions are located on the last page of this paperwork. If you have not heard from [place] regarding the results in 2 weeks, please contact this office.

## 2021-04-05 ENCOUNTER — Encounter: Payer: Self-pay | Admitting: Registered Nurse

## 2021-04-05 ENCOUNTER — Other Ambulatory Visit: Payer: Self-pay

## 2021-04-05 ENCOUNTER — Telehealth: Payer: Self-pay | Admitting: Registered Nurse

## 2021-04-05 DIAGNOSIS — R519 Headache, unspecified: Secondary | ICD-10-CM

## 2021-04-05 NOTE — Patient Instructions (Signed)
° ° ° °  If you have lab work done today you will be contacted with your lab results within the next 2 weeks.  If you have not heard from us then please contact us. The fastest way to get your results is to register for My Chart. ° ° °IF you received an x-ray today, you will receive an invoice from [facility]. Please contact [facility] at [phone] with questions or concerns regarding your invoice.  ° °IF you received labwork today, you will receive an invoice from [facility]. Please contact [facility] at [phone] with questions or concerns regarding your invoice.  ° °Our billing staff will not be able to assist you with questions regarding bills from these companies. ° °You will be contacted with the lab results as soon as they are available. The fastest way to get your results is to activate your My Chart account. Instructions are located on the last page of this paperwork. If you have not heard from us regarding the results in 2 weeks, please contact this office. °  ° ° ° °

## 2021-04-05 NOTE — Progress Notes (Signed)
Telemedicine Encounter- SOAP NOTE Established Patient  This telephone encounter was conducted with the patient's (or proxy's) verbal consent via audio telecommunications: yes/no: Yes Patient was instructed to have this encounter in a suitably private space; and to only have persons present to whom they give permission to participate. In addition, patient identity was confirmed by use of name plus two identifiers (DOB and address).  I discussed the limitations, risks, security and privacy concerns of performing an evaluation and management service by telephone and the availability of in person appointments. I also discussed with the patient that there may be a patient responsible charge related to this service. The patient expressed understanding and agreed to proceed.  I spent a total of TIME; 0 MIN TO 60 MIN: 20 minutes talking with the patient or their proxy.  Patient at home Provider in office  Participants: Kathrin Ruddy, NP and Keane Scrape  Chief Complaint  Patient presents with   Follow-up    Patient states she is following up with because her eyes have became red and painful again ,but now whe has a headache as well and couldn't get out the bed. She has been taking tylenol with no relief     Subjective   Haley Wyatt is a 45 y.o. established patient. Telephone visit today for headache and eye pain  HPI Ongoing for 2 weeks Recent sinusitis relieved by abx and prednisone Notes bp has been wnl No chest pain or palpitations  Unfortunately, Ms. Depoy was called aside by a pressing matter and was forced to leave the call.  I was unable to contact her further through multiple calls. All calls went straight to voicemail.  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 BP CONTROLLED   Hypothyroidism    Joint pain    SOB (shortness of breath) on exertion    Swelling of both lower extremities     Current Outpatient Medications  Medication Sig Dispense Refill   Multiple Minerals-Vitamins (CALCIUM & VIT D3 BONE HEALTH) LIQD Take 30 mLs by mouth daily.     Multiple Vitamins-Minerals (MULTI-VITE) LIQD Take 30 mLs by mouth daily.     amoxicillin-clavulanate (AUGMENTIN) 875-125 MG tablet Take 1 tablet by mouth 2 (two) times daily. (Patient not taking: Reported on 04/05/2021) 14 tablet 0   Cyanocobalamin (B-12 SL) Place 1 Dose under the tongue daily.  (  Patient not taking: No sig reported)     diclofenac Sodium (VOLTAREN) 1 % GEL Apply 2 g topically 4 (four) times daily. (Patient not taking: No sig reported) 100 g 1   esomeprazole (NEXIUM) 20 MG capsule Take 1 capsule (20 mg total) by mouth daily at 12 noon. (Patient not taking: No sig reported) 30 capsule 5   predniSONE (STERAPRED UNI-PAK 21 TAB) 10 MG (21) TBPK tablet Take per package instructions. Do not skip doses. Finish entire supply. (Patient not taking: Reported on 04/05/2021) 1 each 0   No current facility-administered medications for this  visit.    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      Drugs: none      Exercise: stopped exercise one month ago.   Social Determinants of Health   Financial Resource Strain: Not on file  Food Insecurity: Not on file  Transportation Needs: Not on file  Physical Activity: Not on file  Stress: Not on file  Social Connections: Not on file  Intimate Partner Violence: Not on file    ROS  Objective   Vitals as reported by the patient: There were no vitals filed for this visit.  There are no diagnoses linked to this encounter.  PLAN Unable to finish taking history. Will reach out to patient tomorrow.  Unfortunately unable to contact patient directly but given VM with Er precautions detailed Patient encouraged to call clinic with any questions, comments, or concerns.  I discussed the assessment and treatment plan with the patient. The patient was provided an opportunity to ask questions and all were answered. The patient agreed with the plan and demonstrated an understanding of the instructions.   The patient was advised to call back or seek an in-person evaluation if the symptoms worsen or if the  condition fails to improve as anticipated.  I provided 20 minutes of non-face-to-face time during this encounter.  Maximiano Coss, NP  Primary Care at [facility]

## 2021-04-16 ENCOUNTER — Telehealth: Payer: Self-pay | Admitting: Registered Nurse

## 2021-04-16 ENCOUNTER — Ambulatory Visit (INDEPENDENT_AMBULATORY_CARE_PROVIDER_SITE_OTHER): Payer: 59 | Admitting: Registered Nurse

## 2021-04-16 ENCOUNTER — Other Ambulatory Visit: Payer: Self-pay

## 2021-04-16 ENCOUNTER — Encounter: Payer: Self-pay | Admitting: Registered Nurse

## 2021-04-16 VITALS — BP 117/62 | HR 67 | Temp 98.0°F | Resp 18 | Ht 68.0 in | Wt 359.4 lb

## 2021-04-16 DIAGNOSIS — H209 Unspecified iridocyclitis: Secondary | ICD-10-CM | POA: Diagnosis not present

## 2021-04-16 LAB — CBC
HCT: 42.8 % (ref 36.0–46.0)
Hemoglobin: 13.8 g/dL (ref 12.0–15.0)
MCHC: 32.3 g/dL (ref 30.0–36.0)
MCV: 83.3 fl (ref 78.0–100.0)
Platelets: 213 10*3/uL (ref 150.0–400.0)
RBC: 5.13 Mil/uL — ABNORMAL HIGH (ref 3.87–5.11)
RDW: 14.9 % (ref 11.5–15.5)
WBC: 6.2 10*3/uL (ref 4.0–10.5)

## 2021-04-16 LAB — SEDIMENTATION RATE: Sed Rate: 72 mm/hr — ABNORMAL HIGH (ref 0–20)

## 2021-04-16 LAB — C-REACTIVE PROTEIN: CRP: 1.3 mg/dL (ref 0.5–20.0)

## 2021-04-16 NOTE — Progress Notes (Signed)
Acute Office Visit  Subjective:    Patient ID: Haley Wyatt, female    DOB: [birth year], 45 y.o.   MRN: [id number]  Chief Complaint  Patient presents with   Follow-up    Patient states she is here for red eye and some pain in right eye.    HPI Patient is in today for eye pain Both eyes - currently R worse than L  But both have hurt over past few weeks Describes as pressure in eyes Now having redness and swelling in R eye Straining to read, but distance is ok No new products No URI symptoms  Has had ongoing headaches over past few weeks  Past Medical History:  Diagnosis Date   ADD (attention deficit disorder)    ADHD    Allergy    Back pain    "muscle strain from exercising"   Back pain    Borderline diabetes    states "levels have been about 102"   Chest pain    Chronic headaches    Constipation    Fibroid tumor    inside uterus   GERD (gastroesophageal reflux disease)    PT STATES "DOES NOT HAVE IT NOW"   H/O hiatal hernia    Hypertension    NO MEDS.. BP CONTROLLED   Hypothyroidism    Joint pain    SOB (shortness of breath) on exertion    Swelling of both lower extremities     Past Surgical History:  Procedure Laterality Date   ABDOMINAL HYSTERECTOMY     BILATERAL SALPINGECTOMY Bilateral 01/28/2015   Procedure: BILATERAL SALPINGECTOMY;  Surgeon: Eldred Manges, MD;  Location: [facility];  Service: Gynecology;  Laterality: Bilateral;   CARPAL TUNNEL RELEASE Right    DILITATION & CURRETTAGE/HYSTROSCOPY WITH HYDROTHERMAL ABLATION N/A 04/16/2014   Procedure: DILATATION & CURETTAGE/HYSTEROSCOPY WITH HYDROTHERMAL ABLATION;  Surgeon: Frederico Hamman, MD;  Location: [facility];  Service: Gynecology;  Laterality: N/A;   HIATAL HERNIA REPAIR N/A 02/11/2014   Procedure: LAPAROSCOPIC REPAIR OF HIATAL HERNIA;  Surgeon: Pedro Earls, MD;  Location: [facility];  Service: General;  Laterality: N/A;   LAPAROSCOPIC GASTRIC SLEEVE RESECTION N/A 02/11/2014   Procedure:  LAPAROSCOPIC GASTRIC SLEEVE RESECTION;  Surgeon: Pedro Earls, MD;  Location: [facility];  Service: General;  Laterality: N/A;   SHOULDER SURGERY     left shoulder loose body removal   SUPRACERVICAL ABDOMINAL HYSTERECTOMY Bilateral 01/28/2015   Procedure: TOTAL SUPER CERVICAL ABDOMINAL HYSTERECTOMY BILATERAL SALPINGECTOMY;  Surgeon: Eldred Manges, MD;  Location: [facility];  Service: Gynecology;  Laterality: Bilateral;   TUBAL LIGATION     UPPER GI ENDOSCOPY  02/11/2014   Procedure: UPPER GI ENDOSCOPY;  Surgeon: Pedro Earls, MD;  Location: [facility];  Service: General;;    Family History  Problem Relation Age of Onset   Hyperlipidemia Father    Hypertension Father    Diabetes Father    Schizophrenia Father    Depression Father    Diverticulitis Mother    Alcoholism Mother    Colon cancer Maternal Grandmother    Diabetes Paternal Grandmother    Sudden death Neg Hx    Heart attack Neg Hx     Social History   Socioeconomic History   Marital status: Single    Spouse name: Not on file   Number of children: 4   Years of education: Not on file   Highest education level: Not on file  Occupational History   Occupation: [place]  Employer: [employer]  Tobacco Use   Smoking status: Former    Pack years: 0.00    Types: Cigarettes    Quit date: 02/05/2006    Years since quitting: 15.2   Smokeless tobacco: Never  Vaping Use   Vaping Use: Never used  Substance and Sexual Activity   Alcohol use: Yes    Comment: occ   Drug use: No   Sexual activity: Not on file  Other Topics Concern   Not on file  Social History Narrative   Marital status: single      Children: four      Employment:  Mental health professional      Tobacco: none       Alcohol: wine per year.      Drugs: none      Exercise: stopped exercise one month ago.   Social Determinants of Health   Financial Resource Strain: Not on file  Food Insecurity: Not on file  Transportation Needs: Not  on file  Physical Activity: Not on file  Stress: Not on file  Social Connections: Not on file  Intimate Partner Violence: Not on file    Outpatient Medications Prior to Visit  Medication Sig Dispense Refill   Multiple Minerals-Vitamins (CALCIUM & VIT D3 BONE HEALTH) LIQD Take 30 mLs by mouth daily.     Multiple Vitamins-Minerals (MULTI-VITE) LIQD Take 30 mLs by mouth daily.     amoxicillin-clavulanate (AUGMENTIN) 875-125 MG tablet Take 1 tablet by mouth 2 (two) times daily. (Patient not taking: No sig reported) 14 tablet 0   Cyanocobalamin (B-12 SL) Place 1 Dose under the tongue daily.  (Patient not taking: No sig reported)     diclofenac Sodium (VOLTAREN) 1 % GEL Apply 2 g topically 4 (four) times daily. (Patient not taking: No sig reported) 100 g 1   esomeprazole (NEXIUM) 20 MG capsule Take 1 capsule (20 mg total) by mouth daily at 12 noon. (Patient not taking: No sig reported) 30 capsule 5   predniSONE (STERAPRED UNI-PAK 21 TAB) 10 MG (21) TBPK tablet Take per package instructions. Do not skip doses. Finish entire supply. (Patient not taking: No sig reported) 1 each 0   No facility-administered medications prior to visit.    No Known Allergies  Review of Systems  Constitutional: Negative.   HENT: Negative.    Eyes:  Positive for pain, redness and visual disturbance.  Respiratory: Negative.    Cardiovascular: Negative.   Gastrointestinal: Negative.   Genitourinary: Negative.   Musculoskeletal: Negative.   Skin: Negative.   Neurological: Negative.   Psychiatric/Behavioral: Negative.    All other systems reviewed and are negative.     Objective:    Physical Exam Vitals and nursing note reviewed.  Constitutional:      General: She is not in acute distress.    Appearance: Normal appearance. She is not ill-appearing, toxic-appearing or diaphoretic.  Eyes:     General: Lids are normal. Lids are everted, no foreign bodies appreciated. Vision grossly intact. Gaze aligned  appropriately. No allergic shiner or visual field deficit.    Extraocular Movements: Extraocular movements intact.     Right eye: Normal extraocular motion and no nystagmus.     Left eye: Normal extraocular motion and no nystagmus.     Conjunctiva/sclera:     Right eye: Right conjunctiva is injected. Hemorrhage present.     Left eye: Left conjunctiva is injected. Hemorrhage present.     Pupils: Pupils are equal, round, and reactive to light.  Pupils are equal.     Funduscopic exam:    Right eye: No papilledema.        Left eye: No papilledema.     Comments: [place] with EOMs  Cardiovascular:     Rate and Rhythm: Normal rate and regular rhythm.     Pulses: Normal pulses.     Heart sounds: Normal heart sounds. No murmur heard.   No friction rub. No gallop.  Pulmonary:     Effort: Pulmonary effort is normal. No respiratory distress.     Breath sounds: Normal breath sounds. No stridor. No wheezing, rhonchi or rales.  Chest:     Chest wall: No tenderness.  Skin:    General: Skin is warm and dry.     Capillary Refill: Capillary refill takes less than 2 seconds.  Neurological:     General: No focal deficit present.     Mental Status: She is alert and oriented to person, place, and time. Mental status is at baseline.  Psychiatric:        Mood and Affect: Mood normal.        Behavior: Behavior normal.        Thought Content: Thought content normal.        Judgment: Judgment normal.    BP 117/62   Pulse 67   Temp 98 F (36.7 C) (Temporal)   Resp 18   Ht 5\' 8"  (1.727 m)   Wt (!) 359 lb 6.4 oz (163 kg)   LMP 04/28/2014   SpO2 99%   BMI 54.65 kg/m  Wt Readings from Last 3 Encounters:  04/16/21 (!) 359 lb 6.4 oz (163 kg)  03/05/21 (!) 340 lb 3.2 oz (154.3 kg)  08/18/20 (!) 335 lb (152 kg)    There are no preventive care reminders to display for this patient.  There are no preventive care reminders to display for this patient.   Lab Results  Component Value Date   TSH 1.770  08/18/2020   Lab Results  Component Value Date   WBC 8.5 06/02/2020   HGB 13.6 06/02/2020   HCT 42.4 06/02/2020   MCV 84 06/02/2020   PLT 202 12/20/2017   Lab Results  Component Value Date   NA 141 08/18/2020   K 4.5 08/18/2020   CO2 25 08/18/2020   GLUCOSE 96 08/18/2020   BUN 11 08/18/2020   CREATININE 0.86 08/18/2020   BILITOT 0.4 08/18/2020   ALKPHOS 77 08/18/2020   AST 14 08/18/2020   ALT 12 08/18/2020   PROT 7.1 08/18/2020   ALBUMIN 4.0 08/18/2020   CALCIUM 9.1 08/18/2020   ANIONGAP 9 12/20/2017   Lab Results  Component Value Date   CHOL 181 08/18/2020   Lab Results  Component Value Date   HDL 76 08/18/2020   Lab Results  Component Value Date   LDLCALC 94 08/18/2020   Lab Results  Component Value Date   TRIG 54 08/18/2020   Lab Results  Component Value Date   CHOLHDL 2.4 08/18/2020   Lab Results  Component Value Date   HGBA1C 6.3 (H) 08/18/2020       Assessment & Plan:   Problem List Items Addressed This Visit   None    No orders of the defined types were placed in this encounter.  PLAN Seems as symptoms limited anterior surface. 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 Labs today, urgent referral to ophthalmology Return prn Otc analgesics  Patient encouraged to call clinic with any questions, comments, or concerns.  Maximiano Coss, NP

## 2021-04-16 NOTE — Telephone Encounter (Signed)
Pt wanted to know if you could add an A1c on the bw that was drawn today.

## 2021-04-16 NOTE — Patient Instructions (Addendum)
[name] -   Looking like anterior uveitis - will have you see an ophthalmologist today to confirm this. Labs as below. I'll be in touch with results. Ok to use OTC analgesics (tylenol, ibuprofen) for relief for now.   Thank you  Rich     If you have lab work done today you will be contacted with your lab results within the next 2 weeks.  If you have not heard from [place] then please contact us. The fastest way to get your results is to register for My Chart.   IF you received an x-ray today, you will receive an invoice from [facility]. Please contact [facility] at [phone] with questions or concerns regarding your invoice.   IF you received labwork today, you will receive an invoice from [facility]. Please contact [facility] at [phone] with questions or concerns regarding your invoice.   Our billing staff will not be able to assist you with questions regarding bills from these companies.  You will be contacted with the lab results as soon as they are available. The fastest way to get your results is to activate your My Chart account. Instructions are located on the last page of this paperwork. If you have not heard from [place] regarding the results in 2 weeks, please contact this office.

## 2021-04-19 LAB — RPR: RPR Ser Ql: NONREACTIVE

## 2021-04-19 LAB — CMV (CYTOMEGALOVIRUS) DNA ULTRAQUANT, PCR
CMV DNA, QN PCR: NOT DETECTED log IU/mL
CMV DNA, QN Real Time PCR: NOT DETECTED IU/mL

## 2021-04-19 LAB — QUANTIFERON-TB GOLD PLUS
Mitogen-NIL: >10 IU/mL
NIL: 0.03 IU/mL
QuantiFERON-TB Gold Plus: NEGATIVE
TB1-NIL: 0 IU/mL
TB2-NIL: 0.01 IU/mL

## 2021-04-19 LAB — HIV ANTIBODY (ROUTINE TESTING W REFLEX): HIV 1&2 Ab, 4th Generation: NONREACTIVE

## 2021-04-19 LAB — ANA: Anti Nuclear Antibody (ANA): NEGATIVE

## 2021-04-27 ENCOUNTER — Telehealth: Payer: Self-pay

## 2021-04-27 NOTE — Telephone Encounter (Signed)
Pt is calling back to see if Delfino Lovett had enough time to review the notes from the optometrist and results from that.   Pt call back [phone]

## 2021-04-29 NOTE — Telephone Encounter (Signed)
I have not seen any either. Ccing [place] and [place] to check with them Thanks  [name]

## 2021-04-30 NOTE — Telephone Encounter (Signed)
Left a voicemail message informing the patient that we have not gotten any notes from ophthalmology as of now.

## 2021-05-05 ENCOUNTER — Ambulatory Visit (INDEPENDENT_AMBULATORY_CARE_PROVIDER_SITE_OTHER): Payer: 59 | Admitting: Bariatrics

## 2021-05-19 ENCOUNTER — Ambulatory Visit (INDEPENDENT_AMBULATORY_CARE_PROVIDER_SITE_OTHER): Payer: 59 | Admitting: Bariatrics

## 2021-05-21 ENCOUNTER — Encounter: Payer: Self-pay | Admitting: Registered Nurse

## 2021-05-21 ENCOUNTER — Other Ambulatory Visit: Payer: Self-pay

## 2021-05-21 ENCOUNTER — Ambulatory Visit (INDEPENDENT_AMBULATORY_CARE_PROVIDER_SITE_OTHER): Payer: 59 | Admitting: Registered Nurse

## 2021-05-21 VITALS — BP 118/73 | HR 82 | Temp 98.0°F | Resp 18 | Ht 68.0 in | Wt 359.0 lb

## 2021-05-21 DIAGNOSIS — H04129 Dry eye syndrome of unspecified lacrimal gland: Secondary | ICD-10-CM

## 2021-05-21 DIAGNOSIS — F988 Other specified behavioral and emotional disorders with onset usually occurring in childhood and adolescence: Secondary | ICD-10-CM

## 2021-05-21 DIAGNOSIS — R700 Elevated erythrocyte sedimentation rate: Secondary | ICD-10-CM | POA: Diagnosis not present

## 2021-05-21 DIAGNOSIS — R5382 Chronic fatigue, unspecified: Secondary | ICD-10-CM

## 2021-05-21 MED ORDER — LISDEXAMFETAMINE DIMESYLATE 30 MG PO CAPS
30.0000 mg | ORAL_CAPSULE | Freq: Every day | ORAL | 0 refills | Status: DC
Start: 2021-05-21 — End: 2021-06-23

## 2021-05-21 MED ORDER — PREDNISONE 10 MG PO TABS: 30.0000 mg | ORAL_TABLET | Freq: Every day | ORAL | 0 refills | Status: DC

## 2021-05-21 NOTE — Progress Notes (Signed)
Established Patient Office Visit  Subjective:  Patient ID: Kriselle Heavilin, female    DOB: May 10, 1976  Age: 45 y.o. MRN: [id number]  CC:  Chief Complaint  Patient presents with   Follow-up    Patient states she is following up for blood work and eye doctor. Patient states she use to take Vyvanse and thinks she might needs to start taking the medication again    HPI Milee Schoenfelder presents for follow up  Eye inflammation Saw Dr. Katy Fitch. Ulceration, inflammation, concern for autoimmune process.  Given fake tears. Inflammation ongoing. Not worse. No new symptoms. Has deferred routine eye exam until this is controlled. No famhx of autoimmune processes that she is aware of Notes severe fatigue. Sleeping most of each day. Feels like sleep is generally good quality No rash or other symptoms  ADD Hx of this, has been off medication for some time Formerly on Vyvanse '30mg'$  PO qd with good effect Wants to restart No AE in past   Past Medical History:  Diagnosis Date   ADD (attention deficit disorder)    ADHD    Allergy    Back pain    "muscle strain from exercising"   Back pain    Borderline diabetes    states "levels have been about 102"   Chest pain    Chronic headaches    Constipation    Fibroid tumor    inside uterus   GERD (gastroesophageal reflux disease)    PT STATES "DOES NOT HAVE IT NOW"   H/O hiatal hernia    Hypertension    NO MEDS.. BP CONTROLLED   Hypothyroidism    Joint pain    SOB (shortness of breath) on exertion    Swelling of both lower extremities     Past Surgical History:  Procedure Laterality Date   ABDOMINAL HYSTERECTOMY     BILATERAL SALPINGECTOMY Bilateral 01/28/2015   Procedure: BILATERAL SALPINGECTOMY;  Surgeon: Eldred Manges, MD;  Location: [facility];  Service: Gynecology;  Laterality: Bilateral;   CARPAL TUNNEL RELEASE Right    DILITATION & CURRETTAGE/HYSTROSCOPY WITH HYDROTHERMAL ABLATION N/A 04/16/2014   Procedure: DILATATION &  CURETTAGE/HYSTEROSCOPY WITH HYDROTHERMAL ABLATION;  Surgeon: Frederico Hamman, MD;  Location: [facility];  Service: Gynecology;  Laterality: N/A;   HIATAL HERNIA REPAIR N/A 02/11/2014   Procedure: LAPAROSCOPIC REPAIR OF HIATAL HERNIA;  Surgeon: Pedro Earls, MD;  Location: [facility];  Service: General;  Laterality: N/A;   LAPAROSCOPIC GASTRIC SLEEVE RESECTION N/A 02/11/2014   Procedure: LAPAROSCOPIC GASTRIC SLEEVE RESECTION;  Surgeon: Pedro Earls, MD;  Location: [facility];  Service: General;  Laterality: N/A;   SHOULDER SURGERY     left shoulder loose body removal   SUPRACERVICAL ABDOMINAL HYSTERECTOMY Bilateral 01/28/2015   Procedure: TOTAL SUPER CERVICAL ABDOMINAL HYSTERECTOMY BILATERAL SALPINGECTOMY;  Surgeon: Eldred Manges, MD;  Location: [facility];  Service: Gynecology;  Laterality: Bilateral;   TUBAL LIGATION     UPPER GI ENDOSCOPY  02/11/2014   Procedure: UPPER GI ENDOSCOPY;  Surgeon: Pedro Earls, MD;  Location: [facility];  Service: General;;    Family History  Problem Relation Age of Onset   Hyperlipidemia Father    Hypertension Father    Diabetes Father    Schizophrenia Father    Depression Father    Diverticulitis Mother    Alcoholism Mother    Colon cancer Maternal Grandmother    Diabetes Paternal Grandmother    Sudden death Neg Hx    Heart attack Neg Hx  Social History   Socioeconomic History   Marital status: Single    Spouse name: Not on file   Number of children: 4   Years of education: Not on file   Highest education level: Not on file  Occupational History   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      Drugs: none      Exercise: stopped exercise one month ago.   Social Determinants of Health   Financial Resource Strain: Not on file  Food Insecurity: Not on file  Transportation Needs: Not on file  Physical Activity: Not on file  Stress: Not on file  Social Connections: Not on file  Intimate Partner Violence: Not on file    Outpatient Medications Prior to Visit  Medication Sig Dispense Refill   Multiple Minerals-Vitamins (CALCIUM & VIT D3 BONE HEALTH) LIQD Take 30 mLs by mouth daily.     Multiple Vitamins-Minerals (MULTI-VITE) LIQD Take 30 mLs by mouth daily.     amoxicillin-clavulanate (AUGMENTIN) 875-125 MG tablet Take 1 tablet by mouth 2 (two) times daily. (Patient not taking: No sig reported) 14 tablet 0   Cyanocobalamin (B-12 SL) Place 1 Dose under the tongue daily.  (Patient not taking: No sig reported)     diclofenac Sodium (VOLTAREN) 1 % GEL Apply 2 g topically 4 (four) times daily. (Patient not taking: No sig reported) 100 g 1   esomeprazole (NEXIUM) 20 MG capsule Take 1 capsule (20 mg total) by mouth daily at 12 noon. (Patient not taking: No sig reported) 30 capsule 5   predniSONE (STERAPRED UNI-PAK 21 TAB) 10 MG (21) TBPK tablet Take per package instructions. Do not skip doses. Finish entire supply. (Patient not taking: No sig reported) 1 each 0   No facility-administered medications prior to visit.    No Known Allergies  ROS Review of Systems  Constitutional: Negative.   HENT: Negative.    Eyes: Negative.   Respiratory: Negative.    Cardiovascular: Negative.   Gastrointestinal: Negative.   Genitourinary: Negative.   Musculoskeletal: Negative.   Skin: Negative.   Neurological: Negative.   Psychiatric/Behavioral: Negative.    All other systems reviewed and are negative.    Objective:     Physical Exam Vitals and nursing note reviewed.  Constitutional:      General: She is not in acute distress.    Appearance: Normal appearance. She is normal weight. She is not ill-appearing, toxic-appearing or diaphoretic.  Eyes:     General:        Right eye: No discharge.        Left eye: No discharge.     Extraocular Movements: Extraocular movements intact.     Pupils: Pupils are equal, round, and reactive to light.  Cardiovascular:     Rate and  Rhythm: Normal rate and regular rhythm.     Heart sounds: Normal heart sounds. No murmur heard.   No friction rub. No gallop.  Pulmonary:     Effort: Pulmonary effort is normal. No respiratory distress.     Breath sounds: Normal breath sounds. No stridor. No wheezing, rhonchi or rales.  Chest:     Chest wall: No tenderness.  Skin:    General: Skin is warm and dry.  Neurological:     General: No focal deficit present.     Mental Status: She is alert and oriented to person, place, and time. Mental status is at baseline.  Psychiatric:        Mood and Affect: Mood normal.        Behavior: Behavior normal.        Thought Content: Thought content normal.        Judgment: Judgment normal.    BP 118/73   Pulse 82   Temp 98 F (36.7 C) (Temporal)   Resp 18   Ht '5\' 8"'$  (1.727 m)   Wt (!) 359 lb (162.8 kg)   LMP 04/28/2014   SpO2 99%   BMI 54.59 kg/m  Wt Readings from Last 3 Encounters:  05/21/21 (!) 359 lb (162.8 kg)  04/16/21 (!) 359 lb 6.4 oz (163 kg)  03/05/21 (!) 340 lb 3.2 oz (154.3 kg)     There are no preventive care reminders to display for this patient.  There are no preventive care reminders to display for this patient.  Lab Results  Component Value Date   TSH 1.770 08/18/2020   Lab Results  Component Value Date   WBC 6.2 04/16/2021   HGB 13.8 04/16/2021   HCT 42.8 04/16/2021   MCV 83.3 04/16/2021   PLT 213.0 04/16/2021   Lab Results  Component Value Date   NA 141 08/18/2020   K 4.5 08/18/2020   CO2 25  08/18/2020   GLUCOSE 96 08/18/2020   BUN 11 08/18/2020   CREATININE 0.86 08/18/2020   BILITOT 0.4 08/18/2020   ALKPHOS 77 08/18/2020   AST 14 08/18/2020   ALT 12 08/18/2020   PROT 7.1 08/18/2020   ALBUMIN 4.0 08/18/2020   CALCIUM 9.1 08/18/2020   ANIONGAP 9 12/20/2017   Lab Results  Component Value Date   CHOL 181 08/18/2020   Lab Results  Component Value Date   HDL 76 08/18/2020   Lab Results  Component Value Date   LDLCALC 94 08/18/2020   Lab Results  Component Value Date   TRIG 54 08/18/2020   Lab Results  Component Value Date   CHOLHDL 2.4 08/18/2020   Lab Results  Component Value Date   HGBA1C 6.3 (H) 08/18/2020      Assessment & Plan:   Problem List Items Addressed This Visit   None Visit Diagnoses     Attention deficit disorder, unspecified hyperactivity presence    -  Primary   Relevant Medications   lisdexamfetamine (VYVANSE) 30 MG capsule   Dry eye       Relevant Medications   predniSONE (DELTASONE) 10 MG tablet   Other Relevant Orders   Ambulatory referral to Rheumatology   Chronic fatigue       Relevant Orders   Ambulatory referral to Rheumatology   Elevated sed rate       Relevant Orders   Ambulatory referral to Rheumatology       Meds ordered this encounter  Medications   predniSONE (DELTASONE) 10 MG tablet  Sig: Take 3 tablets (30 mg total) by mouth daily with breakfast.    Dispense:  15 tablet    Refill:  0    Order Specific Question:   Supervising Provider    Answer:   Carlota Raspberry, JEFFREY R [[id number]]   lisdexamfetamine (VYVANSE) 30 MG capsule    Sig: Take 1 capsule (30 mg total) by mouth daily.    Dispense:  30 capsule    Refill:  0    Order Specific Question:   Supervising Provider    Answer:   Carlota Raspberry, JEFFREY R [id number]    Follow-up: Return in about 3 months (around 08/21/2021) for chronic conditions.   PLAN Resume Vyvanse as above Prednisone burst - if autoimmune process occurring hoping this can help a bit while we get  rheum consult on board.  Referral to rheum placed Patient encouraged to call clinic with any questions, comments, or concerns.  Maximiano Coss, NP

## 2021-05-21 NOTE — Patient Instructions (Addendum)
Haley Wyatt -  Glad you got in to see Dr. Katy Fitch.  Autoimmune processes- will refer to Rheumatology  Start prednisone tomorrow. '30mg'$  daily for 5 days. Should provide benefit without too many side effects but may make you more energetic, but hungrier.  Start Vyvanse at your convenience. Should help some with fatigue, a lot with focus  Will send further info on autoimmune processes via MyChart -  Let me know if you have questions  Thank you  Rich    If you have lab work done today you will be contacted with your lab results within the next 2 weeks.  If you have not heard from [place] then please contact us. The fastest way to get your results is to register for My Chart.   IF you received an x-ray today, you will receive an invoice from [facility]. Please contact [facility] at [phone] with questions or concerns regarding your invoice.   IF you received labwork today, you will receive an invoice from [facility]. Please contact [facility] at [phone] with questions or concerns regarding your invoice.   Our billing staff will not be able to assist you with questions regarding bills from these companies.  You will be contacted with the lab results as soon as they are available. The fastest way to get your results is to activate your My Chart account. Instructions are located on the last page of this paperwork. If you have not heard from [place] regarding the results in 2 weeks, please contact this office.

## 2021-06-07 ENCOUNTER — Ambulatory Visit (HOSPITAL_COMMUNITY)
Admission: EM | Admit: 2021-06-07 | Discharge: 2021-06-07 | Disposition: A | Discharge status: Home / Self Care | Payer: 59 | Attending: Emergency Medicine | Admitting: Emergency Medicine

## 2021-06-07 ENCOUNTER — Other Ambulatory Visit: Payer: Self-pay

## 2021-06-07 ENCOUNTER — Encounter (HOSPITAL_COMMUNITY): Payer: Self-pay | Admitting: *Deleted

## 2021-06-07 DIAGNOSIS — L089 Local infection of the skin and subcutaneous tissue, unspecified: Secondary | ICD-10-CM

## 2021-06-07 MED ORDER — DOXYCYCLINE HYCLATE 100 MG PO CAPS: 100.0000 mg | ORAL_CAPSULE | Freq: Two times a day (BID) | ORAL | 0 refills | Status: DC

## 2021-06-07 NOTE — ED Provider Notes (Signed)
[facility]    CSN: [id number] Arrival date & time: 06/07/21  1644      History   Chief Complaint Chief Complaint  Patient presents with   Hand Pain    RT small finger    HPI Haley Wyatt is a 45 y.o. female.   Patient here for evaluation of right little finger pain that started last night.  Reports broke right little finger nail but thought it had healed enough that she could get her nails done on Friday.  Reports finger tip is throbbing and painful, now noticing some discoloration at the tip of her finger.  Has not taken any OTC medications or treatments.  Denies any trauma, injury, or other precipitating event.  Denies any fevers, chest pain, shortness of breath, N/V/D, numbness, tingling, weakness, abdominal pain, or headaches.    The history is provided by the patient.  Hand Pain   Past Medical History:  Diagnosis Date   ADD (attention deficit disorder)    ADHD    Allergy    Back pain    "muscle strain from exercising"   Back pain    Borderline diabetes    states "levels have been about 102"   Chest pain    Chronic headaches    Constipation    Fibroid tumor    inside uterus   GERD (gastroesophageal reflux disease)    PT STATES "DOES NOT HAVE IT NOW"   H/O hiatal hernia    Hypertension    NO MEDS.. 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   LEG EDEMA, 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   GERD 06/11/2010    HYPOTHYROIDISM 04/16/2009   OBESITY 04/16/2009   DEPRESSION 03/30/2009    Past Surgical History:  Procedure Laterality Date   ABDOMINAL HYSTERECTOMY     BILATERAL SALPINGECTOMY Bilateral 01/28/2015   Procedure: BILATERAL SALPINGECTOMY;  Surgeon: Eldred Manges, MD;  Location: [facility];  Service: Gynecology;  Laterality: Bilateral;   CARPAL TUNNEL RELEASE Right    DILITATION & CURRETTAGE/HYSTROSCOPY WITH HYDROTHERMAL ABLATION N/A 04/16/2014   Procedure: DILATATION & CURETTAGE/HYSTEROSCOPY WITH HYDROTHERMAL ABLATION;  Surgeon: Frederico Hamman, MD;  Location: [facility];  Service: Gynecology;  Laterality: N/A;   HIATAL HERNIA REPAIR N/A 02/11/2014   Procedure: LAPAROSCOPIC REPAIR OF HIATAL HERNIA;  Surgeon: Pedro Earls, MD;  Location: [facility];  Service: General;  Laterality: N/A;   LAPAROSCOPIC GASTRIC SLEEVE RESECTION N/A 02/11/2014   Procedure: LAPAROSCOPIC GASTRIC SLEEVE RESECTION;  Surgeon: Pedro Earls, MD;  Location: [facility];  Service: General;  Laterality: N/A;   SHOULDER SURGERY     left shoulder loose body removal   SUPRACERVICAL ABDOMINAL HYSTERECTOMY Bilateral 01/28/2015   Procedure: TOTAL SUPER CERVICAL ABDOMINAL HYSTERECTOMY BILATERAL SALPINGECTOMY;  Surgeon: Eldred Manges, MD;  Location: [facility];  Service: Gynecology;  Laterality: Bilateral;   TUBAL LIGATION     UPPER GI ENDOSCOPY  02/11/2014   Procedure: UPPER GI ENDOSCOPY;  Surgeon: Pedro Earls, MD;  Location: [facility];  Service: General;;    OB History     Gravida  4   Para  4   Term      Preterm      AB      Living  4      SAB      IAB      Ectopic      Multiple      Live Births               Home Medications    Prior to Admission medications   Medication Sig Start Date End Date Taking? Authorizing Provider  doxycycline (VIBRAMYCIN) 100 MG capsule Take 1 capsule (100 mg total) by mouth 2 (two) times daily. 06/07/21  Yes Pearson Forster, NP  Cyanocobalamin (B-12 SL) Place 1 Dose under  the tongue daily.  Patient not taking: No sig reported    [provider]  diclofenac Sodium (VOLTAREN) 1 % GEL Apply 2 g topically 4 (four) times daily. Patient not taking: No sig reported 04/10/20   Evelina Bucy, DPM  esomeprazole (NEXIUM) 20 MG capsule Take 1 capsule (20 mg total) by mouth daily at 12 noon. Patient not taking: No sig reported 02/04/20   Levin Erp, PA  lisdexamfetamine (VYVANSE) 30 MG capsule Take 1 capsule (30 mg total) by mouth daily. 05/21/21   Maximiano Coss, NP  Multiple Minerals-Vitamins (CALCIUM & VIT D3 BONE HEALTH) LIQD Take 30 mLs by mouth daily.    [provider]  Multiple Vitamins-Minerals (MULTI-VITE) LIQD Take 30 mLs by mouth daily.    [provider]  predniSONE (DELTASONE) 10 MG tablet Take 3 tablets (30 mg total) by mouth daily with breakfast. 05/21/21   Maximiano Coss, NP    Family History Family History  Problem Relation Age of Onset   Hyperlipidemia Father    Hypertension Father    Diabetes Father    Schizophrenia Father    Depression Father    Diverticulitis Mother    Alcoholism Mother    Colon cancer Maternal Grandmother    Diabetes Paternal Grandmother    Sudden death Neg Hx    Heart attack Neg Hx     Social History Social History   Tobacco Use   Smoking status: Former    Types: Cigarettes    Quit date: 02/05/2006    Years since quitting: 15.3   Smokeless tobacco: Never  Vaping Use   Vaping Use: Never used  Substance Use Topics   Alcohol use: Yes    Comment: occ   Drug use: No     Allergies   Patient has no known allergies.   Review of Systems Review of Systems  Musculoskeletal:  Positive for myalgias.  Skin:  Positive for color change.  All other systems reviewed and are negative.   Physical Exam Triage Vital Signs ED Triage Vitals  Enc Vitals Group     BP 06/07/21 1748 127/89     Pulse Rate 06/07/21 1748 80     Resp --      Temp 06/07/21 1748 98.6 F (37 C)     Temp  src --      SpO2 06/07/21 1748 99 %     Weight --      Height --      Head Circumference --      Peak Flow --      Pain Score 06/07/21 1749 7  Pain Loc --      Pain Edu? --      Excl. in [employer]? --    No data found.  Updated Vital Signs BP 127/89   Pulse 80   Temp 98.6 F (37 C)   LMP 04/28/2014   SpO2 99%   Visual Acuity Right Eye Distance:   Left Eye Distance:   Bilateral Distance:    Right Eye Near:   Left Eye Near:    Bilateral Near:     Physical Exam Vitals and nursing note reviewed.  Constitutional:      General: She is not in acute distress.    Appearance: Normal appearance. She is not ill-appearing, toxic-appearing or diaphoretic.  HENT:     Head: Normocephalic and atraumatic.  Eyes:     Conjunctiva/sclera: Conjunctivae normal.  Cardiovascular:     Rate and Rhythm: Normal rate.     Pulses: Normal pulses.  Pulmonary:     Effort: Pulmonary effort is normal.  Abdominal:     General: Abdomen is flat.  Musculoskeletal:        General: Normal range of motion.     Cervical back: Normal range of motion.  Skin:    General: Skin is warm and dry.     Findings: Lesion (right 5th finger, difficult to visual nail bed due to nailpolish and false nail tip) present.  Neurological:     General: No focal deficit present.     Mental Status: She is alert and oriented to person, place, and time.  Psychiatric:        Mood and Affect: Mood normal.        UC Treatments / Results  Labs (all labs ordered are listed, but only abnormal results are displayed) Labs Reviewed - No data to display  EKG   Radiology No results found.  Procedures Procedures (including critical care time)  Medications Ordered in UC Medications - No data to display  Initial Impression / Assessment and Plan / UC Course  I have reviewed the triage vital signs and the nursing notes.  Pertinent labs & imaging results that were available during my care of the patient were reviewed by me  and considered in my medical decision making (see chart for details).    Assessment negative for red flags or concerns.  Likely finger infection.  Will treat with doxycycline.  Recommend warm water or epsom salt soaks 3-4 times a day.  Follow up for any worsening symptoms.   Final Clinical Impressions(s) / UC Diagnoses   Final diagnoses:  Finger infection     Discharge Instructions      Take the doxycycline one pill twice a day for the next 10 days.    Soak your finger in a warm water or epsom salt soak 3-4 times a day.   You can take Ibuprofen and/or Tylenol as needed for pain relief.   If the pain or swelling does not improve or gets worse in the next few days, come back or go to the ED for further evaluation.      ED Prescriptions     Medication Sig Dispense Auth. Provider   doxycycline (VIBRAMYCIN) 100 MG capsule Take 1 capsule (100 mg total) by mouth 2 (two) times daily. 20 capsule Pearson Forster, NP      PDMP not reviewed this encounter.   Pearson Forster, NP 06/07/21 [id number]

## 2021-06-07 NOTE — ED Triage Notes (Signed)
Pt reports Rt small finger pain  since last night.

## 2021-06-07 NOTE — Discharge Instructions (Addendum)
Take the doxycycline one pill twice a day for the next 10 days.    Soak your finger in a warm water or epsom salt soak 3-4 times a day.   You can take Ibuprofen and/or Tylenol as needed for pain relief.   If the pain or swelling does not improve or gets worse in the next few days, come back or go to the ED for further evaluation.

## 2021-06-18 ENCOUNTER — Ambulatory Visit: Payer: 59 | Admitting: Internal Medicine

## 2021-06-21 NOTE — Progress Notes (Deleted)
Office Visit Note  Patient: Haley Wyatt             Date of Birth: 12/07/1975           MRN: [id number]             PCP: Maximiano Coss, NP Referring: Maximiano Coss, NP Visit Date: 06/22/2021 Occupation: '@GUAROCC' @  Subjective:  No chief complaint on file.   History of Present Illness: Haley Wyatt is a 45 y.o. female here for chronic dry eye, anterior uveitis, with chronic fatigue and elevated ESR. She has seen [facility] for evaluation of this with reported inflammation seen and possible autoimmune process. She was started on oral prednisone along with lubricating tears for treatments.***   Labs reviewed 03/2021 ANA neg ESR 72 CRP 1.3 CBC wnl TB neg RPR neg HIV neg    Activities of Daily Living:  Patient reports morning stiffness for *** {minute/hour:19697}.   Patient {ACTIONS;DENIES/REPORTS:21021675::"Denies"} nocturnal pain.  Difficulty dressing/grooming: {ACTIONS;DENIES/REPORTS:21021675::"Denies"} Difficulty climbing stairs: {ACTIONS;DENIES/REPORTS:21021675::"Denies"} Difficulty getting out of chair: {ACTIONS;DENIES/REPORTS:21021675::"Denies"} Difficulty using hands for taps, buttons, cutlery, and/or writing: {ACTIONS;DENIES/REPORTS:21021675::"Denies"}  No Rheumatology ROS completed.   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 BP CONTROLLED   Hypothyroidism    Joint pain    SOB (shortness of breath) on exertion    Swelling of both lower extremities     Family History  Problem Relation Age of Onset   Hyperlipidemia Father    Hypertension Father    Diabetes Father    Schizophrenia Father    Depression Father    Diverticulitis Mother    Alcoholism Mother    Colon cancer Maternal Grandmother    Diabetes Paternal Grandmother    Sudden death Neg Hx    Heart attack Neg Hx    Past Surgical History:  Procedure Laterality Date   ABDOMINAL HYSTERECTOMY     BILATERAL SALPINGECTOMY Bilateral 01/28/2015   Procedure: BILATERAL SALPINGECTOMY;  Surgeon: Eldred Manges, MD;  Location: [facility];  Service: Gynecology;  Laterality: Bilateral;   CARPAL TUNNEL RELEASE Right    DILITATION & CURRETTAGE/HYSTROSCOPY WITH HYDROTHERMAL ABLATION N/A 04/16/2014   Procedure: DILATATION & CURETTAGE/HYSTEROSCOPY WITH HYDROTHERMAL ABLATION;  Surgeon: Frederico Hamman, MD;  Location: [facility];  Service: Gynecology;  Laterality: N/A;   HIATAL HERNIA REPAIR N/A 02/11/2014   Procedure: LAPAROSCOPIC REPAIR OF HIATAL  HERNIA;  Surgeon: Pedro Earls, MD;  Location: [facility];  Service: General;  Laterality: N/A;   LAPAROSCOPIC GASTRIC SLEEVE RESECTION N/A 02/11/2014   Procedure: LAPAROSCOPIC GASTRIC SLEEVE RESECTION;  Surgeon: Pedro Earls, MD;  Location: [facility];  Service: General;  Laterality: N/A;   SHOULDER SURGERY     left shoulder loose body removal   SUPRACERVICAL ABDOMINAL HYSTERECTOMY Bilateral 01/28/2015   Procedure: TOTAL SUPER CERVICAL ABDOMINAL HYSTERECTOMY  BILATERAL SALPINGECTOMY;  Surgeon: Eldred Manges, MD;  Location: [facility];  Service: Gynecology;  Laterality: Bilateral;   TUBAL LIGATION     UPPER GI ENDOSCOPY  02/11/2014   Procedure: UPPER GI ENDOSCOPY;  Surgeon: Pedro Earls, MD;  Location: [facility];  Service: General;;   Social History   Social History Narrative   Marital status: single      Children: four      Employment:  Education officer, community      Tobacco: none       Alcohol: wine per year.      Drugs: none      Exercise: stopped exercise one month ago.   Immunization History  Administered Date(s) Administered   PFIZER(Purple Top)SARS-COV-2 Vaccination 01/07/2020, 01/28/2020   PPD Test 04/08/2011   Tdap 12/11/2019     Objective: Vital Signs: LMP 04/28/2014    Physical Exam   Musculoskeletal Exam: ***  CDAI Exam: CDAI Score: -- Patient Global: --; Provider Global: -- Swollen: --; Tender: -- Joint Exam 06/22/2021   No joint exam has been documented for this visit   There is currently no information documented on the homunculus. Go to the Rheumatology activity and complete the homunculus joint exam.  Investigation: No additional findings.  Imaging: No results found.  Recent Labs: Lab Results  Component Value Date   WBC 6.2 04/16/2021   HGB 13.8 04/16/2021   PLT 213.0 04/16/2021   NA 141 08/18/2020   K 4.5 08/18/2020   CL 103 08/18/2020   CO2 25 08/18/2020   GLUCOSE 96 08/18/2020   BUN 11 08/18/2020   CREATININE 0.86 08/18/2020   BILITOT 0.4 08/18/2020   ALKPHOS 77 08/18/2020   AST 14 08/18/2020   ALT 12 08/18/2020   PROT 7.1 08/18/2020   ALBUMIN 4.0 08/18/2020   CALCIUM 9.1 08/18/2020   GFRAA 94 08/18/2020   QFTBGOLDPLUS NEGATIVE 04/16/2021    Speciality Comments: No specialty comments available.  Procedures:  No procedures performed Allergies: Patient has no known allergies.   Assessment / Plan:     Visit Diagnoses: No diagnosis found.  Orders: No orders of the defined  types were placed in this encounter.  No orders of the defined types were placed in this encounter.   Face-to-face time spent with patient was *** minutes. Greater than 50% of time was spent in counseling and coordination of care.  Follow-Up Instructions: No follow-ups on file.   Collier Salina, MD  Note - This record has been created using [employer].  Chart creation errors have been sought, but may not always  have been located. Such creation errors do not reflect on  the standard of medical care.

## 2021-06-22 ENCOUNTER — Ambulatory Visit: Payer: 59 | Admitting: Internal Medicine

## 2021-06-22 NOTE — Progress Notes (Signed)
Office Visit Note  Patient: Haley Wyatt             Date of Birth: 1976/06/17           MRN: [id number]             PCP: Maximiano Coss, NP Referring: Maximiano Coss, NP Visit Date: 06/23/2021  Subjective:  New Patient (Initial Visit) (Patient complains of generalized fatigue and bilateral eye (left>right) issues. Patient complains of left heel pain, worse with first steps.)   History of Present Illness: Haley Wyatt is a 45 y.o. female here for dry eyes, chronic fatigue, elevated sedimentation rate and severe dryness with small corneal ulceration evaluated with [facility]. She was started on lubricating eye drops and taking prednisone for inflammation.  She does not recall any particular event or medical changes preceding the onset of her eye pain irritation and redness.  The symptoms improved significantly with the short course of prednisone at this time just ongoing lubricating eyedrops.  Ophthalmology exam demonstrated looks like mostly anterior superficial eye inflammation with some marginal corneal ulceration changes.  She has noticed some increase in pain at her heels that is worst first thing in the morning with applying weight and this improves after a few minutes.  She also notices it again throughout the day if she is off her feet for a prolonged period of time.  She does not have any specific history of chronic heel pain or problems such as plantar fasciitis in the past.  Besides that she just feels very tired overall she has previously been on Vyvanse for a stimulant due to chronic fatigue and sleepiness restarting this but is still having chronic fatigue.  She does not describe any specific changes to her sleep patterns compared to the usual.  Labs reviewed 03/2021 ANA neg ESR 72 CRP 1.3 CBC unremarkable HIV neg TB neg   Activities of Daily Living:  Patient reports morning stiffness for 20 minutes.   Patient Denies nocturnal pain.  Difficulty dressing/grooming:  Denies Difficulty climbing stairs: Reports Difficulty getting out of chair: Reports Difficulty using hands for taps, buttons, cutlery, and/or writing: Denies  Review of Systems  Constitutional:  Positive for fatigue.  HENT:  Negative for mouth sores, mouth dryness and nose dryness.   Eyes:  Positive for pain, redness, visual disturbance and dryness. Negative for itching.  Respiratory:  Negative for cough, hemoptysis, shortness of breath and difficulty breathing.   Cardiovascular:  Positive for swelling in legs/feet. Negative for chest pain and palpitations.  Gastrointestinal:  Negative for abdominal pain, blood in stool, constipation and diarrhea.  Endocrine: Positive for increased urination.  Genitourinary:  Negative for painful urination.  Musculoskeletal:  Positive for joint pain, joint pain, morning stiffness and muscle tenderness. Negative for joint swelling, myalgias, muscle weakness and myalgias.  Skin:  Negative for color change, rash and redness.  Allergic/Immunologic: Negative for susceptible to infections.  Neurological:  Positive for memory loss. Negative for dizziness, numbness, headaches and weakness.  Hematological:  Positive for bruising/bleeding tendency and swollen glands.  Psychiatric/Behavioral:  Positive for confusion. Negative for sleep disturbance.    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 BP CONTROLLED   Hypothyroidism    Joint pain    SOB (shortness of breath) on exertion    Swelling of both lower extremities     Family History  Problem Relation Age of Onset   Diverticulitis Mother    Alcoholism Mother    Hyperlipidemia Father    Hypertension Father    Diabetes Father    Schizophrenia Father    Depression Father    Mental illness Sister    Drug abuse Sister    Colon cancer Maternal Grandmother    Diabetes Paternal Grandmother    Sudden death Neg Hx    Heart attack Neg Hx    Past Surgical History:  Procedure Laterality Date   ABDOMINAL HYSTERECTOMY     BILATERAL SALPINGECTOMY Bilateral 01/28/2015   Procedure: BILATERAL SALPINGECTOMY;  Surgeon: Eldred Manges, MD;  Location: [facility];  Service: Gynecology;  Laterality: Bilateral;   CARPAL TUNNEL RELEASE Right    DILITATION & CURRETTAGE/HYSTROSCOPY WITH HYDROTHERMAL ABLATION N/A 04/16/2014   Procedure: DILATATION & CURETTAGE/HYSTEROSCOPY WITH HYDROTHERMAL ABLATION;  Surgeon: Frederico Hamman, MD;  Location: [facility];  Service: Gynecology;  Laterality: N/A;   HIATAL HERNIA REPAIR N/A 02/11/2014   Procedure: LAPAROSCOPIC REPAIR OF HIATAL HERNIA;  Surgeon: Pedro Earls, MD;  Location: [facility];  Service: General;  Laterality: N/A;   LAPAROSCOPIC GASTRIC SLEEVE RESECTION N/A 02/11/2014   Procedure: LAPAROSCOPIC GASTRIC SLEEVE RESECTION;  Surgeon: Pedro Earls, MD;  Location: [facility];  Service:  General;  Laterality: N/A;   SHOULDER SURGERY     left shoulder loose body removal   SUPRACERVICAL ABDOMINAL HYSTERECTOMY Bilateral 01/28/2015   Procedure: TOTAL SUPER CERVICAL ABDOMINAL HYSTERECTOMY BILATERAL SALPINGECTOMY;  Surgeon: Eldred Manges, MD;  Location: [facility];  Service: Gynecology;  Laterality: Bilateral;   TUBAL LIGATION     UPPER GI ENDOSCOPY  02/11/2014   Procedure: UPPER GI ENDOSCOPY;  Surgeon: Pedro Earls, MD;  Location: [facility];  Service: General;;   Social History   Social History Narrative   Marital status: single      Children: four      Employment:  Education officer, community      Tobacco: none       Alcohol: wine per year.      Drugs: none      Exercise: stopped exercise one month ago.   Immunization History  Administered Date(s) Administered   PFIZER(Purple Top)SARS-COV-2 Vaccination 01/07/2020, 01/28/2020   PPD Test 04/08/2011   Tdap 12/11/2019     Objective: Vital Signs: BP (!) 149/91 (BP Location: Right Arm, Patient  Position: Sitting, Cuff Size: Normal)   Pulse 74   Ht 5' 8" (1.727 m)   Wt (!) 355 lb (161 kg)   LMP 04/28/2014   BMI 53.98 kg/m    Physical Exam Constitutional:      Appearance: She is obese.  HENT:     Mouth/Throat:     Mouth: Mucous membranes are moist.     Pharynx: Oropharynx is clear.  Eyes:     Conjunctiva/sclera: Conjunctivae normal.     Comments: Left eye pingueculae  Cardiovascular:     Rate and Rhythm: Normal rate and regular rhythm.  Pulmonary:     Effort: Pulmonary effort is normal.     Breath sounds: Normal breath sounds.  Skin:    General: Skin is warm and dry.     Findings: No rash.  Neurological:     General: No focal deficit present.     Mental Status: She is alert.     Deep Tendon Reflexes: Reflexes normal.  Psychiatric:        Mood and Affect: Mood normal.     Musculoskeletal Exam:  Left shoulder stiffness with overhead ROM but intact passive ROM, right shoulder normal Elbows full ROM no  tenderness or swelling Wrists full ROM no tenderness or swelling Fingers full ROM no tenderness or swelling Knees full ROM no tenderness or swelling, bilateral patellofemoral crepitus present Ankles full ROM no tenderness or swelling, bilateral heel squeeze tenderness, left sided tenderness to pressure along anterior border of heel plantar surface    Investigation: No additional findings.  Imaging: No results found.  Recent Labs: Lab Results  Component Value Date   WBC 6.2 04/16/2021   HGB 13.8 04/16/2021   PLT 213.0 04/16/2021   NA 141 08/18/2020   K 4.5 08/18/2020   CL 103 08/18/2020   CO2 25 08/18/2020   GLUCOSE 96 08/18/2020   BUN 11 08/18/2020   CREATININE 0.86 08/18/2020   BILITOT 0.4 08/18/2020   ALKPHOS 77 08/18/2020   AST 14 08/18/2020   ALT 12 08/18/2020   PROT 7.1 08/18/2020   ALBUMIN 4.0 08/18/2020   CALCIUM 9.1 08/18/2020   GFRAA 94 08/18/2020   QFTBGOLDPLUS NEGATIVE 04/16/2021    Speciality Comments: No specialty comments available.  Procedures:  No procedures performed Allergies: Patient has no known allergies.   Assessment / Plan:     Visit Diagnoses: Sedimentation rate elevation - Plan: Rheumatoid factor, Sedimentation rate, Sjogrens syndrome-A extractable nuclear antibody  High sedimentation rate only focal inflammation seen at the eyes we will repeat levels today since the eye symptoms and inflammation appear to have improved after the short prednisone treatment.  May be seeing on a period of low disease activity versus a reactive or limited process.  No particular skin or joint changes appreciated on exam to explain inflammatory markers.  Low initial suspicion for a systemic inflammatory process if serology testing is negative, would only need follow-up for symptoms change or recurrence or worsening of ophthalmology disease if needing to go on immunosuppressive therapy.  Chondromalacia of patella, unspecified laterality  Patellofemoral crepitus  and anterior knee pain consistent with chondromalacia patella probably more mechanical in symptoms could be worse due to some weight increase with her morbid obesity.  No particular effusions or joint instability appreciated.  Heel pain, bilateral - Plan: Rheumatoid factor, Sedimentation rate, Sjogrens syndrome-A extractable nuclear antibody  Heel pain localizes somewhat more posterior than expected but can still be related to the plantar fasciitis.  Recommend initial conservative treatment  for this with stretching type exercises provided.  Corneal ulcer, unspecified laterality - Plan: Rheumatoid factor, Sedimentation rate, Sjogrens syndrome-A extractable nuclear antibody  Anterior eye inflammation no specific related symptoms on exam at this time we will check additional tests with rheumatoid factor and SSA antibodies.  She had previous negative ANA antibody titer.  No supporting symptoms for systemic sarcoidosis.  Orders: Orders Placed This Encounter  Procedures   Rheumatoid factor   Sedimentation rate   Sjogrens syndrome-A extractable nuclear antibody    No orders of the defined types were placed in this encounter.    Follow-Up Instructions: No follow-ups on file.   Collier Salina, MD  Note - This record has been created using [employer].  Chart creation errors have been sought, but may not always  have been located. Such creation errors do not reflect on  the standard of medical care.

## 2021-06-23 ENCOUNTER — Other Ambulatory Visit: Payer: Self-pay

## 2021-06-23 ENCOUNTER — Ambulatory Visit (INDEPENDENT_AMBULATORY_CARE_PROVIDER_SITE_OTHER): Payer: 59 | Admitting: Internal Medicine

## 2021-06-23 ENCOUNTER — Encounter: Payer: Self-pay | Admitting: Internal Medicine

## 2021-06-23 ENCOUNTER — Other Ambulatory Visit: Payer: Self-pay | Admitting: Registered Nurse

## 2021-06-23 VITALS — BP 149/91 | HR 74 | Ht 68.0 in | Wt 355.0 lb

## 2021-06-23 DIAGNOSIS — F988 Other specified behavioral and emotional disorders with onset usually occurring in childhood and adolescence: Secondary | ICD-10-CM

## 2021-06-23 DIAGNOSIS — R700 Elevated erythrocyte sedimentation rate: Secondary | ICD-10-CM | POA: Diagnosis not present

## 2021-06-23 DIAGNOSIS — M79671 Pain in right foot: Secondary | ICD-10-CM

## 2021-06-23 DIAGNOSIS — M2240 Chondromalacia patellae, unspecified knee: Secondary | ICD-10-CM | POA: Diagnosis not present

## 2021-06-23 DIAGNOSIS — H16009 Unspecified corneal ulcer, unspecified eye: Secondary | ICD-10-CM

## 2021-06-23 DIAGNOSIS — Z9884 Bariatric surgery status: Secondary | ICD-10-CM

## 2021-06-23 DIAGNOSIS — M79672 Pain in left foot: Secondary | ICD-10-CM

## 2021-06-23 HISTORY — DX: Pain in right foot: M79.671

## 2021-06-23 HISTORY — DX: Elevated erythrocyte sedimentation rate: R70.0

## 2021-06-23 HISTORY — DX: Unspecified corneal ulcer, unspecified eye: H16.009

## 2021-06-23 NOTE — Patient Instructions (Signed)
Tests  Erythrocyte Sedimentation Rate Test Why am I having this test? The erythrocyte sedimentation rate (ESR) test is used to help find illnesses related to: Sudden (acute) or long-term (chronic) infections. Inflammation. The body's disease-fighting system attacking healthy cells (autoimmune diseases). Cancer. Tissue death. If you have symptoms that may be related to any of these illnesses, your health care provider may do an ESR test before doing more specific tests. If you have an inflammatory immune disease, such as rheumatoid arthritis, you may have this test to help monitor your therapy. What is being tested? This test measures how long it takes for your red blood cells (erythrocytes) to settle in a solution over a certain amount of time (sedimentation rate). When you have an infection or inflammation, your red blood cells clump together and settle faster. The sedimentation rate provides information about how much inflammation is present in the body. What kind of sample is taken? A blood sample is required for this test. It is usually collected by inserting a needle into a blood vessel. How do I prepare for this test? Follow any instructions from your health care provider about changing or stopping your regular medicines. Tell a health care provider about: Any allergies you have. All medicines you are taking, including vitamins, herbs, eye drops, creams, and over-the-counter medicines. Any blood disorders you have. Any surgeries you have had. Any medical conditions you have, such as thyroid or kidney disease. Whether you are pregnant or may be pregnant. How are the results reported? Your results will be reported as a value that measures sedimentation rate in millimeters per hour (mm/hr). Your health care provider will compare your results to normal ranges that were established after testing a large group of people (reference values). Reference values may vary among labs and hospitals.  For this test, common reference values, which vary by age and gender, are: Newborn: 0-2 mm/hr. Child, up to puberty: 0-10 mm/hr. Female: Under 50 years: 0-20 mm/hr. 50-85 years: 0-30 mm/hr. Over 85 years: 0-42 mm/hr. Female: Under 50 years: 0-15 mm/hr. 50-85 years: 0-20 mm/hr. Over 85 years: 0-30 mm/hr. Certain conditions or medicines may cause ESR levels to be falsely lower or higher, such as: Pregnancy. Obesity. Steroids, birth control pills, and blood thinners. Thyroid or kidney disease. What do the results mean? Results that are within reference values are considered normal, meaning that the level of inflammation in your body is healthy. High ESR levels mean that there is inflammation in your body. You will have more tests to help make a diagnosis. Inflammation may result from many different conditions or injuries. Talk with your health care provider about what your results mean. Questions to ask your health care provider Ask your health care provider, or the department that is doing the test: When will my results be ready? How will I get my results? What are my treatment options? What other tests do I need? What are my next steps? Summary The erythrocyte sedimentation rate (ESR) test is used to help find illnesses associated with sudden (acute) or long-term (chronic) infections, inflammation, autoimmune diseases, cancer, or tissue death. If you have symptoms that may be related to any of these illnesses, your health care provider may do an ESR test before doing more specific tests. If you have an inflammatory immune disease, such as rheumatoid arthritis, you may have this test to help monitor your therapy. This test measures how long it takes for your red blood cells (erythrocytes) to settle in a solution over a certain  amount of time (sedimentation rate). This provides information about how much inflammation is present in the body.    Rheumatoid Factor Test Why am I having this  test? The rheumatoid factor test is used to help diagnose certain autoimmune diseases. Normally, your body makes protective proteins called antibodies (IgM, IgG, and IgA) to help fight off infections. If you have an autoimmune disease, your body may make a collection of antibodies that do not function correctly (autoantibodies). They attack tissues that are wrongly identified as foreign. In some autoimmune diseases, these autoantibodies are known as the rheumatoid factor (RF). You may have this test if your health care provider suspects that you have an autoimmune disease, such as: Rheumatoid arthritis (RA). Systemic lupus erythematosus (SLE). Sjgren's syndrome. Mixed connective tissue disease. A majority of people who have rheumatoid arthritis have a positive rheumatoid factor. Raised levels of these autoantibodies can also sometimes be a sign of other autoimmune diseases. However, it is also possible for the RF test to be negative even when a disease is present. Likewise, a small number of people may have a positive RF test when an autoimmune disease is not actually present. Other tests may be needed to help make a diagnosis. What is being tested? This test checks your blood for the RF autoantibodies. What kind of sample is taken? A blood sample is required for this test. It is usually collected by inserting a needle into a blood vessel or by sticking a finger with a small needle. How are the results reported? Your test result will be reported as either positive or negative for RF autoantibodies. What do the results mean? A negative result means that no RF or only a small amount was found in your blood. This means that it is unlikely that you have an autoimmune disease. A positive result means that a larger amount of RF autoantibodies was found in your blood. This may indicate that you have RA or another autoimmune disease. Your health care provider will talk to you about doing more tests to  confirm your results. Talk with your health care provider about what your results mean. Questions to ask your health care provider Ask your health care provider, or the department that is doing the test: When will my results be ready? How will I get my results? What are my treatment options? What other tests do I need? What are my next steps? Summary The rheumatoid factor test is used to help diagnose certain autoimmune diseases. In some autoimmune diseases, the body makes autoantibodies known as the rheumatoid factor (RF), which attack tissues that are wrongly identified as foreign. A negative result means that no RF or only a small amount was found in your blood. A positive result means that a larger amount of RF autoantibodies was found in your blood. Talk with your health care provider about what your results mean.

## 2021-06-24 LAB — SEDIMENTATION RATE: Sed Rate: 25 mm/h — ABNORMAL HIGH (ref 0–20)

## 2021-06-24 LAB — SJOGRENS SYNDROME-A EXTRACTABLE NUCLEAR ANTIBODY: SSA (Ro) (ENA) Antibody, IgG: <1 AI

## 2021-06-24 LAB — RHEUMATOID FACTOR: Rheumatoid fact SerPl-aCnc: <14 IU/mL (ref <14)

## 2021-06-24 MED ORDER — LISDEXAMFETAMINE DIMESYLATE 30 MG PO CAPS: 30.0000 mg | ORAL_CAPSULE | Freq: Every day | ORAL | 0 refills | Status: DC

## 2021-06-24 NOTE — Telephone Encounter (Signed)
Last written:05/21/2021 Last ov:05/21/2021 Next ov:not scheduled yet  Vyvanse

## 2021-06-29 ENCOUNTER — Ambulatory Visit: Payer: 59 | Admitting: Internal Medicine

## 2021-07-31 ENCOUNTER — Other Ambulatory Visit: Payer: Self-pay | Admitting: Registered Nurse

## 2021-07-31 DIAGNOSIS — F988 Other specified behavioral and emotional disorders with onset usually occurring in childhood and adolescence: Secondary | ICD-10-CM

## 2021-08-02 MED ORDER — LISDEXAMFETAMINE DIMESYLATE 30 MG PO CAPS: 30.0000 mg | ORAL_CAPSULE | Freq: Every day | ORAL | 0 refills | Status: DC

## 2021-08-02 NOTE — Telephone Encounter (Signed)
Patient is requesting a refill of the following medications: Requested Prescriptions   Pending Prescriptions Disp Refills   lisdexamfetamine (VYVANSE) 30 MG capsule 30 capsule 0    Sig: Take 1 capsule (30 mg total) by mouth daily.    Date of patient request: 07/31/2021 Last office visit: 05/21/2021 Date of last refill: 06/24/2021 Last refill amount: 30 capsules Follow up time period per chart:N/A

## 2021-09-02 ENCOUNTER — Other Ambulatory Visit: Payer: Self-pay | Admitting: Registered Nurse

## 2021-09-02 DIAGNOSIS — F988 Other specified behavioral and emotional disorders with onset usually occurring in childhood and adolescence: Secondary | ICD-10-CM

## 2021-09-03 MED ORDER — LISDEXAMFETAMINE DIMESYLATE 30 MG PO CAPS: 30.0000 mg | ORAL_CAPSULE | Freq: Every day | ORAL | 0 refills | Status: DC

## 2021-09-03 NOTE — Telephone Encounter (Signed)
Requesting:Vyvanse 30 mg Contract: UDS: Last Visit:05/01/21 Next Visit:n/a Last Refill:08/02/21 30 tabs 0 refills  Please Advise

## 2021-10-08 ENCOUNTER — Telehealth: Payer: 59 | Admitting: Family

## 2021-10-08 ENCOUNTER — Other Ambulatory Visit: Payer: Self-pay | Admitting: Registered Nurse

## 2021-10-08 DIAGNOSIS — Z20828 Contact with and (suspected) exposure to other viral communicable diseases: Secondary | ICD-10-CM

## 2021-10-08 DIAGNOSIS — F988 Other specified behavioral and emotional disorders with onset usually occurring in childhood and adolescence: Secondary | ICD-10-CM

## 2021-10-08 DIAGNOSIS — R6889 Other general symptoms and signs: Secondary | ICD-10-CM

## 2021-10-08 MED ORDER — LISDEXAMFETAMINE DIMESYLATE 30 MG PO CAPS: 30.0000 mg | ORAL_CAPSULE | Freq: Every day | ORAL | 0 refills | Status: DC

## 2021-10-08 MED ORDER — OSELTAMIVIR PHOSPHATE 75 MG PO CAPS: 75.0000 mg | ORAL_CAPSULE | Freq: Two times a day (BID) | ORAL | 0 refills | Status: DC

## 2021-10-08 NOTE — Patient Instructions (Signed)
Influenza, Adult °Influenza, also called "the flu," is a viral infection that mainly affects the respiratory tract. This includes the lungs, nose, and throat. The flu spreads easily from person to person (is contagious). It causes common cold symptoms, along with high fever and body aches. °What are the causes? °This condition is caused by the influenza virus. You can get the virus by: °Breathing in droplets that are in the air from an infected person's cough or sneeze. °Touching something that has the virus on it (has been contaminated) and then touching your mouth, nose, or eyes. °What increases the risk? °The following factors may make you more likely to get the flu: °Not washing or sanitizing your hands often. °Having close contact with many people during cold and flu season. °Touching your mouth, eyes, or nose without first washing or sanitizing your hands. °Not getting an annual flu shot. °You may have a higher risk for the flu, including serious problems, such as a lung infection (pneumonia), if you: °Are older than 65. °Are pregnant. °Have a weakened disease-fighting system (immune system). This includes people who have HIV or AIDS, are on chemotherapy, or are taking medicines that reduce (suppress) the immune system. °Have a long-term (chronic) illness, such as heart disease, kidney disease, diabetes, or lung disease. °Have a liver disorder. °Are severely overweight (morbidly obese). °Have anemia. °Have asthma. °What are the signs or symptoms? °Symptoms of this condition usually begin suddenly and last 4-14 days. These may include: °Fever and chills. °Headaches, body aches, or muscle aches. °Sore throat. °Cough. °Runny or stuffy (congested) nose. °Chest discomfort. °Poor appetite. °Weakness or fatigue. °Dizziness. °Nausea or vomiting. °How is this diagnosed? °This condition may be diagnosed based on: °Your symptoms and medical history. °A physical exam. °Swabbing your nose or throat and testing the fluid  for the influenza virus. °How is this treated? °If the flu is diagnosed early, you can be treated with antiviral medicine that is given by mouth (orally) or through an IV. This can help reduce how severe the illness is and how long it lasts. °Taking care of yourself at home can help relieve symptoms. Your health care provider may recommend: °Taking over-the-counter medicines. °Drinking plenty of fluids. °In many cases, the flu goes away on its own. If you have severe symptoms or complications, you may be treated in a hospital. °Follow these instructions at home: °Activity °Rest as needed and get plenty of sleep. °Stay home from work or school as told by your health care provider. Unless you are visiting your health care provider, avoid leaving home until your fever has been gone for 24 hours without taking medicine. °Eating and drinking °Take an oral rehydration solution (ORS). This is a drink that is sold at pharmacies and retail stores. °Drink enough fluid to keep your urine pale yellow. °Drink clear fluids in small amounts as you are able. Clear fluids include water, ice chips, fruit juice mixed with water, and low-calorie sports drinks. °Eat bland, easy-to-digest foods in small amounts as you are able. These foods include bananas, applesauce, rice, lean meats, toast, and crackers. °Avoid drinking fluids that contain a lot of sugar or caffeine, such as energy drinks, regular sports drinks, and soda. °Avoid alcohol. °Avoid spicy or fatty foods. °General instructions °  °Take over-the-counter and prescription medicines only as told by your health care provider. °Use a cool mist humidifier to add humidity to the air in your home. This can make it easier to breathe. °When using a cool mist humidifier,   clean it daily. Empty the water and replace it with clean water. °Cover your mouth and nose when you cough or sneeze. °Wash your hands with soap and water often and for at least 20 seconds, especially after you cough or  sneeze. If soap and water are not available, use alcohol-based hand sanitizer. °Keep all follow-up visits. This is important. °How is this prevented? ° °Get an annual flu shot. This is usually available in late summer, fall, or winter. Ask your health care provider when you should get your flu shot. °Avoid contact with people who are sick during cold and flu season. This is generally fall and winter. °Contact a health care provider if: °You develop new symptoms. °You have: °Chest pain. °Diarrhea. °A fever. °Your cough gets worse. °You produce more mucus. °You feel nauseous or you vomit. °Get help right away if you: °Develop shortness of breath or have difficulty breathing. °Have skin or nails that turn a bluish color. °Have severe pain or stiffness in your neck. °Develop a sudden headache or sudden pain in your face or ear. °Cannot eat or drink without vomiting. °These symptoms may represent a serious problem that is an emergency. Do not wait to see if the symptoms will go away. Get medical help right away. Call your local emergency services (911 in the [place] Do not drive yourself to the hospital. °Summary °Influenza, also called "the flu," is a viral infection that primarily affects your respiratory tract. °Symptoms of the flu usually begin suddenly and last 4-14 days. °Getting an annual flu shot is the best way to prevent getting the flu. °Stay home from work or school as told by your health care provider. Unless you are visiting your health care provider, avoid leaving home until your fever has been gone for 24 hours without taking medicine. °Keep all follow-up visits. This is important. °This information is not intended to replace advice given to you by your health care provider. Make sure you discuss any questions you have with your health care provider. °Document Revised: 05/29/2020 Document Reviewed: 05/29/2020 °Elsevier Patient Education © 2022 [employer] Inc[employer] ° °

## 2021-10-08 NOTE — Progress Notes (Signed)
Virtual Visit Consent   Haley Wyatt, you are scheduled for a virtual visit with a [facility] provider today.     Just as with appointments in the office, your consent must be obtained to participate.  Your consent will be active for this visit and any virtual visit you may have with one of our providers in the next 365 days.     If you have a MyChart account, a copy of this consent can be sent to you electronically.  All virtual visits are billed to your insurance company just like a traditional visit in the office.    As this is a virtual visit, video technology does not allow for your provider to perform a traditional examination.  This may limit your provider's ability to fully assess your condition.  If your provider identifies any concerns that need to be evaluated in person or the need to arrange testing (such as labs, EKG, etc.), we will make arrangements to do so.     Although advances in technology are sophisticated, we cannot ensure that it will always work on either your end or our end.  If the connection with a video visit is poor, the visit may have to be switched to a telephone visit.  With either a video or telephone visit, we are not always able to ensure that we have a secure connection.     I need to obtain your verbal consent now.   Are you willing to proceed with your visit today?    Deanna Dolder has provided verbal consent on 10/08/2021 for a virtual visit (video or telephone).   Evelina Dun, FNP   Date: 10/08/2021 12:07 PM   Virtual Visit via Video Note   I, Evelina Dun, connected with  Haley Wyatt  ([id number], Aug 05, 1976) on 10/08/21 at 12:00 PM EST by a video-enabled telemedicine application and verified that I am speaking with the correct person using two identifiers.  Location: Patient: Virtual Visit Location Patient: Other: work Provider: Ecologist: Home Office   I discussed the limitations of evaluation and management by  telemedicine and the availability of in person appointments. The patient expressed understanding and agreed to proceed.    History of Present Illness: Haley Wyatt is a 45 y.o. who identifies as a female who was assigned female at birth, and is being seen today for flu like symptoms. Her grandson diagnosed with flu 2 days ago.   HPI: Influenza This is a new problem. The current episode started today. The problem occurs constantly. The problem has been gradually worsening. Associated symptoms include chills, congestion, coughing, fatigue, a fever and headaches. Pertinent negatives include no nausea, sore throat or vomiting. She has tried acetaminophen and rest for the symptoms. The treatment provided mild relief.   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 (Patient not taking: No sig reported), Disp: 100 g, Rfl: 1   doxycycline (VIBRAMYCIN) 100 MG capsule, Take 1 capsule (100 mg  total) by mouth 2 (two) times daily. (Patient not taking: Reported on 06/23/2021), Disp: 20 capsule, Rfl: 0   esomeprazole (NEXIUM) 20 MG capsule, Take 1 capsule (20 mg total) by mouth daily at 12 noon. (Patient not taking: No sig reported), Disp: 30 capsule, Rfl: 5   lisdexamfetamine (VYVANSE) 30 MG capsule, Take 1 capsule (30 mg total) by mouth daily., Disp: 30 capsule, Rfl: 0   Multiple Minerals-Vitamins (CALCIUM & VIT D3 BONE HEALTH) LIQD, Take 30 mLs by mouth daily., Disp: , Rfl:    Multiple Vitamins-Minerals (MULTI-VITE) LIQD, Take 30 mLs by mouth daily., Disp: , Rfl:    predniSONE (DELTASONE) 10 MG tablet, Take 3 tablets (30 mg total) by mouth daily with breakfast. (Patient not taking: Reported on 06/23/2021), Disp: 15 tablet, Rfl: 0  Observations/Objective: Patient is well-developed, well-nourished in no acute distress.  Resting comfortably  at home.  Head is normocephalic, atraumatic.  No labored breathing.  Speech is clear and coherent with logical content.  Patient is alert and oriented at baseline.  Nasal congestion  Assessment and Plan: 1. Exposure to influenza - oseltamivir (TAMIFLU) 75 MG capsule; Take 1 capsule (75 mg total) by mouth 2 (two) times daily.  Dispense: 10 capsule; Refill: 0  2. Flu-like symptoms - oseltamivir (TAMIFLU) 75 MG capsule; Take 1 capsule (75 mg total) by mouth 2 (two) times daily.  Dispense: 10 capsule; Refill: 0 COVID positive, rest, force fluids, tylenol as needed,  report any worsening symptoms such as increased shortness of breath, swelling, or continued high fevers. Possible adverse effects discussed with antivirals.    Follow Up Instructions: I discussed the assessment and treatment plan with the patient. The patient was provided an opportunity to ask questions and all were answered. The patient agreed with the plan and demonstrated an understanding of the instructions.  A copy of instructions were sent to the patient via MyChart unless otherwise  noted below.     The patient was advised to call back or seek an in-person evaluation if the symptoms worsen or if the condition fails to improve as anticipated.  Time:  I spent 6 minutes with the patient via telehealth technology discussing the above problems/concerns.    Evelina Dun, FNP

## 2021-10-12 ENCOUNTER — Encounter: Payer: Self-pay | Admitting: Registered Nurse

## 2021-10-12 ENCOUNTER — Other Ambulatory Visit: Payer: Self-pay

## 2021-10-12 ENCOUNTER — Ambulatory Visit (INDEPENDENT_AMBULATORY_CARE_PROVIDER_SITE_OTHER): Payer: 59 | Admitting: Registered Nurse

## 2021-10-12 VITALS — BP 120/73 | HR 76 | Temp 98.0°F | Resp 18 | Ht 68.0 in | Wt 350.0 lb

## 2021-10-12 DIAGNOSIS — F988 Other specified behavioral and emotional disorders with onset usually occurring in childhood and adolescence: Secondary | ICD-10-CM | POA: Diagnosis not present

## 2021-10-12 DIAGNOSIS — B354 Tinea corporis: Secondary | ICD-10-CM

## 2021-10-12 MED ORDER — TIRZEPATIDE 2.5 MG/0.5ML ~~LOC~~ SOAJ: 2.5000 mg | SUBCUTANEOUS | 0 refills | Status: DC

## 2021-10-12 MED ORDER — KETOCONAZOLE 2 % EX CREA: 1.0000 "application " | TOPICAL_CREAM | Freq: Every day | CUTANEOUS | 3 refills | Status: DC

## 2021-10-12 MED ORDER — LISDEXAMFETAMINE DIMESYLATE 30 MG PO CAPS: 30.0000 mg | ORAL_CAPSULE | Freq: Every day | ORAL | 0 refills | Status: DC

## 2021-10-12 NOTE — Patient Instructions (Addendum)
Haley Wyatt -   Doristine Devoid to see you  No acute concerns today  Continue vyvanse  Start mounjaro 2.5mg  subq  weekly. Increase to 5mg  on week 5. Increase to 7.5mg  on week 8.  Use ketoconazole on the rash once daily until resolved. Keep area clean and dry  Diet and exercise regularly  See you in 3 mo  Thank you  Rich     If you have lab work done today you will be contacted with your lab results within the next 2 weeks.  If you have not heard from [place] then please contact us. The fastest way to get your results is to register for My Chart.   IF you received an x-ray today, you will receive an invoice from [facility]. Please contact [facility] at [phone] with questions or concerns regarding your invoice.   IF you received labwork today, you will receive an invoice from [facility]. Please contact [facility] at [phone] with questions or concerns regarding your invoice.   Our billing staff will not be able to assist you with questions regarding bills from these companies.  You will be contacted with the lab results as soon as they are available. The fastest way to get your results is to activate your My Chart account. Instructions are located on the last page of this paperwork. If you have not heard from [place] regarding the results in 2 weeks, please contact this office.

## 2021-10-12 NOTE — Progress Notes (Signed)
Established Patient Office Visit  Subjective:  Patient ID: Haley Wyatt, female    DOB: 22-Nov-1975  Age: 45 y.o. MRN: [id number]  CC:  Chief Complaint  Patient presents with   Weight Loss    Patient states she is here to discuss weight loss. Pt states she also having some back pain . Patient would like to discuss mediation    HPI Haley Wyatt presents for weight loss and back pain  ADD/ADHD Doing well with Vyvanse 30mg  po qd Taking most days. Doing very well. No AE. Hopes to continue.  Weight Loss Unfortunately was late to appt with [facility]  Interested in options for weight loss Has not used GLP 1s in the past.  Back pain Acute on chronic Notes that she has been told there was a benign growth on her vertebrae in her lumbar spine in the past.  Rash Red, under fold of skin on stomach. Some weeping. No skin breakdown Has tried to keep this clean and dry, but no improvement.  Past Medical History:  Diagnosis Date   ADD (attention deficit disorder)    ADHD    Allergy    Back pain    "muscle strain from exercising"   Back pain    Borderline diabetes    states "levels have been about 102"   Chest pain    Chronic headaches    Constipation    Fibroid tumor    inside uterus   GERD (gastroesophageal reflux disease)    PT STATES "DOES NOT HAVE IT NOW"   H/O hiatal hernia    Hypertension    NO MEDS.. BP CONTROLLED   Hypothyroidism    Joint pain    SOB (shortness of breath) on exertion    Swelling of both lower extremities     Past Surgical History:  Procedure Laterality Date   ABDOMINAL HYSTERECTOMY     BILATERAL SALPINGECTOMY Bilateral 01/28/2015   Procedure: BILATERAL SALPINGECTOMY;  Surgeon: Eldred Manges, MD;  Location: [facility];  Service: Gynecology;  Laterality: Bilateral;   CARPAL TUNNEL RELEASE Right    DILITATION & CURRETTAGE/HYSTROSCOPY WITH HYDROTHERMAL ABLATION N/A 04/16/2014   Procedure: DILATATION & CURETTAGE/HYSTEROSCOPY WITH  HYDROTHERMAL ABLATION;  Surgeon: Frederico Hamman, MD;  Location: [facility];  Service: Gynecology;  Laterality: N/A;   HIATAL HERNIA REPAIR N/A 02/11/2014   Procedure: LAPAROSCOPIC REPAIR OF HIATAL HERNIA;  Surgeon: Pedro Earls, MD;  Location: [facility];  Service: General;  Laterality: N/A;   LAPAROSCOPIC GASTRIC SLEEVE RESECTION N/A 02/11/2014   Procedure: LAPAROSCOPIC GASTRIC SLEEVE RESECTION;  Surgeon: Pedro Earls, MD;  Location: [facility];  Service: General;  Laterality: N/A;   SHOULDER SURGERY     left shoulder loose body removal   SUPRACERVICAL ABDOMINAL HYSTERECTOMY Bilateral 01/28/2015   Procedure: TOTAL SUPER CERVICAL ABDOMINAL HYSTERECTOMY BILATERAL SALPINGECTOMY;  Surgeon: Eldred Manges, MD;  Location: [facility];  Service: Gynecology;  Laterality: Bilateral;   TUBAL LIGATION     UPPER GI ENDOSCOPY  02/11/2014   Procedure: UPPER GI ENDOSCOPY;  Surgeon: Pedro Earls, MD;  Location: [facility];  Service: General;;    Family History  Problem Relation Age of Onset   Diverticulitis Mother    Alcoholism Mother    Hyperlipidemia Father    Hypertension Father    Diabetes Father    Schizophrenia Father    Depression Father    Mental illness Sister    Drug abuse Sister    Colon cancer Maternal Grandmother  Diabetes Paternal Grandmother    Sudden death Neg Hx    Heart attack Neg Hx     Social History   Socioeconomic History   Marital status: Single    Spouse name: Not on file   Number of children: 4   Years of education: Not on file   Highest education level: Not on file  Occupational History   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      Drugs: none      Exercise: stopped exercise one month ago.   Social Determinants of Health   Financial Resource Strain: Not on file  Food Insecurity: Not on file  Transportation Needs: Not on file  Physical Activity: Not on file  Stress: Not on file  Social Connections: Not on file  Intimate Partner Violence: Not on file    Outpatient Medications Prior to Visit  Medication Sig Dispense Refill   Multiple Minerals-Vitamins (CALCIUM & VIT D3 BONE HEALTH) LIQD Take 30 mLs by mouth daily.     Multiple Vitamins-Minerals (MULTI-VITE) LIQD Take 30 mLs by mouth daily.     lisdexamfetamine (VYVANSE) 30 MG capsule Take 1 capsule (30 mg total) by mouth daily. 30 capsule 0   Cyanocobalamin (B-12 SL) Place 1 Dose under the tongue daily. (Patient not taking: Reported on 10/12/2021)     diclofenac Sodium (VOLTAREN) 1 % GEL Apply 2 g topically 4 (four) times daily. (Patient not taking: Reported on 03/05/2021) 100 g 1   doxycycline (VIBRAMYCIN) 100 MG capsule Take 1 capsule (100 mg total) by mouth 2 (two) times daily. (Patient not taking: Reported on 06/23/2021) 20 capsule 0   esomeprazole (NEXIUM) 20 MG capsule Take 1 capsule (20 mg total) by mouth daily at 12 noon. (Patient not taking: Reported on 03/05/2021) 30 capsule 5   oseltamivir (TAMIFLU) 75 MG capsule Take 1 capsule (75 mg total) by mouth 2 (two) times daily. (Patient not taking: Reported on 10/12/2021) 10 capsule 0   predniSONE (DELTASONE) 10 MG tablet Take 3 tablets (30 mg total) by mouth daily with breakfast. (Patient not taking: Reported on 06/23/2021) 15 tablet 0   No facility-administered medications prior to visit.    No Known Allergies  ROS Review of Systems  Constitutional: Negative.   HENT: Negative.    Eyes: Negative.    Respiratory: Negative.    Cardiovascular: Negative.   Gastrointestinal: Negative.   Genitourinary: Negative.   Musculoskeletal:  Positive for back pain. Negative for arthralgias, gait problem, joint swelling, myalgias and neck pain.  Skin:  Positive for rash. Negative for color change, pallor and wound.  Neurological: Negative.   Psychiatric/Behavioral: Negative.    All other systems reviewed and are negative.    Objective:    Physical Exam Vitals and nursing note reviewed.  Constitutional:  General: She is not in acute distress.    Appearance: Normal appearance. She is obese. She is not ill-appearing, toxic-appearing or diaphoretic.  Cardiovascular:     Rate and Rhythm: Normal rate and regular rhythm.     Heart sounds: Normal heart sounds. No murmur heard.   No friction rub. No gallop.  Pulmonary:     Effort: Pulmonary effort is normal. No respiratory distress.     Breath sounds: Normal breath sounds. No stridor. No wheezing, rhonchi or rales.  Chest:     Chest wall: No tenderness.  Musculoskeletal:        General: Tenderness (low back bilat) present. No swelling, deformity or signs of injury. Normal range of motion.     Right lower leg: No edema.     Left lower leg: No edema.  Skin:    General: Skin is warm and dry.     Findings: Rash (tinea corporis lower abdomen) present.  Neurological:     General: No focal deficit present.     Mental Status: She is alert and oriented to person, place, and time. Mental status is at baseline.  Psychiatric:        Mood and Affect: Mood normal.        Behavior: Behavior normal.        Thought Content: Thought content normal.        Judgment: Judgment normal.    BP 120/73    Pulse 76    Temp 98 F (36.7 C) (Temporal)    Resp 18    Ht 5\' 8"  (1.727 m)    Wt (!) 350 lb (158.8 kg)    LMP 04/28/2014    BMI 53.22 kg/m  Wt Readings from Last 3 Encounters:  10/12/21 (!) 350 lb (158.8 kg)  06/23/21 (!) 355 lb (161 kg)  05/21/21 (!) 359  lb (162.8 kg)     Health Maintenance Due  Topic Date Due   Hepatitis C Screening  Never done   PAP SMEAR-Modifier  07/16/2016   COVID-19 Vaccine (3 - Booster for Pfizer series) 03/24/2020   INFLUENZA VACCINE  05/24/2021    There are no preventive care reminders to display for this patient.  Lab Results  Component Value Date   TSH 1.770 08/18/2020   Lab Results  Component Value Date   WBC 6.2 04/16/2021   HGB 13.8 04/16/2021   HCT 42.8 04/16/2021   MCV 83.3 04/16/2021   PLT 213.0 04/16/2021   Lab Results  Component Value Date   NA 141 08/18/2020   K 4.5 08/18/2020   CO2 25 08/18/2020   GLUCOSE 96 08/18/2020   BUN 11 08/18/2020   CREATININE 0.86 08/18/2020   BILITOT 0.4 08/18/2020   ALKPHOS 77 08/18/2020   AST 14 08/18/2020   ALT 12 08/18/2020   PROT 7.1 08/18/2020   ALBUMIN 4.0 08/18/2020   CALCIUM 9.1 08/18/2020   ANIONGAP 9 12/20/2017   Lab Results  Component Value Date   CHOL 181 08/18/2020   Lab Results  Component Value Date   HDL 76 08/18/2020   Lab Results  Component Value Date   LDLCALC 94 08/18/2020   Lab Results  Component Value Date   TRIG 54 08/18/2020   Lab Results  Component Value Date   CHOLHDL 2.4 08/18/2020   Lab Results  Component Value Date   HGBA1C 6.3 (H) 08/18/2020      Assessment & Plan:   Problem List Items Addressed This Visit   None Visit Diagnoses  Tinea corporis    -  Primary   Relevant Medications   ketoconazole (NIZORAL) 2 % cream   Attention deficit disorder, unspecified hyperactivity presence       Relevant Medications   lisdexamfetamine (VYVANSE) 30 MG capsule   Obesity, morbid, BMI 50 or higher (HCC)       Relevant Medications   lisdexamfetamine (VYVANSE) 30 MG capsule   tirzepatide (MOUNJARO) 2.5 MG/0.5ML Pen       Meds ordered this encounter  Medications   lisdexamfetamine (VYVANSE) 30 MG capsule    Sig: Take 1 capsule (30 mg total) by mouth daily.    Dispense:  30 capsule    Refill:  0     Order Specific Question:   Supervising Provider    Answer:   Carlota Raspberry, JEFFREY R [[id number]]   ketoconazole (NIZORAL) 2 % cream    Sig: Apply 1 application topically daily.    Dispense:  30 g    Refill:  3    Order Specific Question:   Supervising Provider    Answer:   Carlota Raspberry, JEFFREY R [[id number]]   tirzepatide (MOUNJARO) 2.5 MG/0.5ML Pen    Sig: Inject 2.5 mg into the skin once a week.    Dispense:  2 mL    Refill:  0    Order Specific Question:   Supervising Provider    Answer:   Carlota Raspberry, JEFFREY R [[id number]]    Follow-up: Return in about 3 months (around 01/10/2022) for mounjaro med check.   PLAN Will check on mounjaro coverage for weight loss. Discussed risks, benefits, AE with patient who voices understanding. If no coverage, can try semaglutide or trulicity. Med check 3 mo after starting this. Ketoconazole for rash as above. Refill vyvanse Return for CPE and labs at pt convenience. Patient encouraged to call clinic with any questions, comments, or concerns.  Maximiano Coss, NP

## 2021-10-14 ENCOUNTER — Encounter: Payer: Self-pay | Admitting: Registered Nurse

## 2021-10-15 NOTE — Telephone Encounter (Signed)
I called and spoke with patient about her PA getting denied from insurance and that we would E appeal it.

## 2021-11-11 ENCOUNTER — Other Ambulatory Visit: Payer: Self-pay | Admitting: Registered Nurse

## 2021-11-11 DIAGNOSIS — F988 Other specified behavioral and emotional disorders with onset usually occurring in childhood and adolescence: Secondary | ICD-10-CM

## 2021-11-11 MED ORDER — LISDEXAMFETAMINE DIMESYLATE 30 MG PO CAPS: 30.0000 mg | ORAL_CAPSULE | Freq: Every day | ORAL | 0 refills | Status: DC

## 2021-11-11 NOTE — Telephone Encounter (Signed)
Patient is requesting a refill of the following medications: Requested Prescriptions   Pending Prescriptions Disp Refills   lisdexamfetamine (VYVANSE) 30 MG capsule 30 capsule 0    Sig: Take 1 capsule (30 mg total) by mouth daily.    Date of patient request: 11/11/21 Last office visit: 10/12/21 Date of last refill: 10/12/21 Last refill amount: 30

## 2021-11-18 ENCOUNTER — Telehealth: Payer: Self-pay

## 2021-11-18 NOTE — Telephone Encounter (Signed)
Just waiting on patient to send her updated insurance card for PA

## 2021-11-18 NOTE — Telephone Encounter (Signed)
Caller name:Nikala Waterfield   On DPR? :Yes  Call back number: [phone]  Provider they see: Delfino Lovett  Reason for call:Pt is calling she switched to Friday health Insurance and needs prior authorization on tirzepatide [facility]) 2.5 MG/0.5ML Pen

## 2021-11-18 NOTE — Telephone Encounter (Signed)
Error

## 2021-11-26 ENCOUNTER — Telehealth (INDEPENDENT_AMBULATORY_CARE_PROVIDER_SITE_OTHER): Payer: 59 | Admitting: Registered Nurse

## 2021-11-26 DIAGNOSIS — R6889 Other general symptoms and signs: Secondary | ICD-10-CM

## 2021-11-26 MED ORDER — OSELTAMIVIR PHOSPHATE 75 MG PO CAPS: 75.0000 mg | ORAL_CAPSULE | Freq: Two times a day (BID) | ORAL | 0 refills | Status: DC

## 2021-11-26 NOTE — Progress Notes (Signed)
Telemedicine Encounter- SOAP NOTE Established Patient  This telephone encounter was conducted with the patient's (or proxy's) verbal consent via audio telecommunications: yes/no: Yes Patient was instructed to have this encounter in a suitably private space; and to only have persons present to whom they give permission to participate. In addition, patient identity was confirmed by use of name plus two identifiers (DOB and address).  I discussed the limitations, risks, security and privacy concerns of performing an evaluation and management service by telephone and the availability of in person appointments. I also discussed with the patient that there may be a patient responsible charge related to this service. The patient expressed understanding and agreed to proceed.  I spent a total of 14 minutes talking with the patient or their proxy.  Patient at home Provider in office  Participants: Kathrin Ruddy, NP and [facility]  Chief Complaint  Patient presents with   Sore Throat    Fatigue, low grade fever    Subjective   Haley Wyatt is a 46 y.o. established patient. Telephone visit today for sore throat  HPI Symptoms onset yesterday.  Started with sore throat, low grade temp. Now having some fatigue. Usually up fairly early, still in bed today.  Notes sick contact - in office - unsure what illness, but similar symptoms.  Took Theraflu last night, it helped sleep.  Woke with headache today.  Notes some flu like aches  Negative covid test at home.  Denies shob, doe, chest pain, nvd.  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 BP CONTROLLED   Hypothyroidism    Joint pain    SOB (shortness of breath) on exertion    Swelling of both lower extremities     Current Outpatient Medications  Medication Sig Dispense Refill   ketoconazole (NIZORAL) 2 % cream Apply 1 application topically daily. 30 g 3   lisdexamfetamine (VYVANSE) 30 MG capsule Take 1 capsule (30 mg total) by mouth daily. 30 capsule 0   Multiple Minerals-Vitamins (CALCIUM & VIT D3 BONE HEALTH) LIQD Take 30 mLs by mouth daily.     Multiple Vitamins-Minerals (MULTI-VITE) LIQD Take 30 mLs by mouth daily.     oseltamivir (TAMIFLU) 75 MG capsule Take 1 capsule (75 mg total) by mouth 2 (  two) times daily. 10 capsule 0   tirzepatide (MOUNJARO) 2.5 MG/0.5ML Pen Inject 2.5 mg into the skin once a week. 2 mL 0   No current facility-administered medications for this visit.    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      Drugs: none      Exercise: stopped exercise one month ago.   Social Determinants of Health   Financial Resource Strain: Not on file  Food Insecurity: Not on file  Transportation Needs: Not on file  Physical Activity: Not on file  Stress: Not on file  Social Connections: Not on file  Intimate Partner Violence: Not on file    ROS Per hpi  Objective   Vitals as reported by the patient: There were no vitals filed for this visit.  [name] was seen today for sore throat.  Diagnoses and all orders for this visit:  Flu-like symptoms -     oseltamivir (TAMIFLU) 75 MG capsule; Take 1 capsule (75 mg total) by mouth 2 (two) times daily.    PLAN With flu like symptoms and sick contacts, will start tamiflu. Reviewed risks, benefits, alternatives Continue OTC. Supplement with acetaminophen to a total of 3000mg  daily in 3-6 divided doses. Supportive care with aggressive hydration and rest. If worsening or failing to improve, consider ER presentation over weekend. Patient encouraged to call clinic with any questions, comments, or concerns.  I discussed the assessment and treatment plan with the patient. The patient was provided an opportunity to ask questions and all were answered. The patient agreed with the plan and demonstrated an understanding of the instructions.   The patient was advised to call back or seek an in-person evaluation if the symptoms worsen or if the condition fails to improve as anticipated.  I provided 14  minutes of non-face-to-face time during this encounter.  Maximiano Coss, NP

## 2021-12-01 ENCOUNTER — Telehealth: Payer: Self-pay | Admitting: Registered Nurse

## 2021-12-01 NOTE — Telephone Encounter (Signed)
I have updated insurance information in the system.   Please advise

## 2021-12-02 NOTE — Telephone Encounter (Signed)
I have received patient new insurance information and can do a new PA for her medication today.

## 2021-12-16 ENCOUNTER — Other Ambulatory Visit: Payer: Self-pay | Admitting: Registered Nurse

## 2021-12-16 DIAGNOSIS — F988 Other specified behavioral and emotional disorders with onset usually occurring in childhood and adolescence: Secondary | ICD-10-CM

## 2021-12-18 ENCOUNTER — Encounter: Payer: Self-pay | Admitting: Registered Nurse

## 2021-12-18 ENCOUNTER — Other Ambulatory Visit: Payer: Self-pay | Admitting: Registered Nurse

## 2021-12-20 NOTE — Telephone Encounter (Signed)
Will start the PA for this patient will new insurance to see if it will get approved this time.

## 2021-12-21 MED ORDER — LISDEXAMFETAMINE DIMESYLATE 30 MG PO CAPS: 30.0000 mg | ORAL_CAPSULE | Freq: Every day | ORAL | 0 refills | Status: DC

## 2021-12-21 NOTE — Telephone Encounter (Signed)
Patient is requesting a refill of the following medications: Requested Prescriptions   Pending Prescriptions Disp Refills   lisdexamfetamine (VYVANSE) 30 MG capsule 30 capsule 0    Sig: Take 1 capsule (30 mg total) by mouth daily.    Date of patient request: 12/19/2021 Last office visit: 10/12/2021 Date of last refill: 11/11/2021 Last refill amount: 30 capsule Follow up time period per chart: none

## 2021-12-22 ENCOUNTER — Telehealth: Payer: Self-pay

## 2021-12-22 NOTE — Telephone Encounter (Signed)
Patient PA has been started and waiting on determination. ?

## 2021-12-23 NOTE — Telephone Encounter (Signed)
Pt called in wanting to talk to you about the PA process.  ? ?Please advise  ?

## 2021-12-23 NOTE — Telephone Encounter (Signed)
Called and LVM for the patient to call back ?

## 2022-01-07 ENCOUNTER — Other Ambulatory Visit: Payer: Self-pay | Admitting: Registered Nurse

## 2022-01-07 DIAGNOSIS — R7303 Prediabetes: Secondary | ICD-10-CM

## 2022-01-07 MED ORDER — METFORMIN HCL 1000 MG PO TABS: 1000.0000 mg | ORAL_TABLET | Freq: Two times a day (BID) | ORAL | 3 refills | Status: DC

## 2022-02-10 ENCOUNTER — Ambulatory Visit (INDEPENDENT_AMBULATORY_CARE_PROVIDER_SITE_OTHER): Payer: 59 | Admitting: Family Medicine

## 2022-02-10 ENCOUNTER — Encounter: Payer: Self-pay | Admitting: Family Medicine

## 2022-02-10 ENCOUNTER — Ambulatory Visit: Payer: 59 | Admitting: Family Medicine

## 2022-02-10 VITALS — BP 138/86 | HR 57 | Temp 97.5°F | Resp 16 | Ht 68.0 in | Wt 340.4 lb

## 2022-02-10 DIAGNOSIS — K047 Periapical abscess without sinus: Secondary | ICD-10-CM

## 2022-02-10 DIAGNOSIS — R591 Generalized enlarged lymph nodes: Secondary | ICD-10-CM | POA: Diagnosis not present

## 2022-02-10 DIAGNOSIS — N760 Acute vaginitis: Secondary | ICD-10-CM | POA: Diagnosis not present

## 2022-02-10 MED ORDER — AMOXICILLIN-POT CLAVULANATE 875-125 MG PO TABS: 1.0000 | ORAL_TABLET | Freq: Two times a day (BID) | ORAL | 0 refills | Status: DC

## 2022-02-10 MED ORDER — TRAMADOL HCL 50 MG PO TABS: 50.0000 mg | ORAL_TABLET | Freq: Three times a day (TID) | ORAL | 0 refills | Status: AC | PRN

## 2022-02-10 MED ORDER — FLUCONAZOLE 150 MG PO TABS: 150.0000 mg | ORAL_TABLET | Freq: Once | ORAL | 0 refills | Status: AC

## 2022-02-10 NOTE — Progress Notes (Signed)
? ?  Subjective:  ? ? Patient ID: Haley Wyatt, female    DOB: 09-16-1976, 46 y.o.   MRN: [id number] ? ?HPI ?Tooth pain- 'i feel horrible today'.  Pt finished abx for R lower tooth on Tuesday- Keflex '500mg'$ .  Yesterday developed pain on L lower side w/ swelling over the lower jaw.  'i've got 3 lumps'.  Very painful.  No improvement w/ tylenol or ibuprofen.   ? ?Vaginitis- pt reports yeast infxns w/ abx use ? ? ?Review of Systems ?For ROS see HPI  ?   ?Objective:  ? Physical Exam ?Vitals reviewed.  ?Constitutional:   ?   General: She is not in acute distress. ?   Appearance: She is obese. She is not ill-appearing or toxic-appearing.  ?HENT:  ?   Head: Normocephalic and atraumatic.  ?   Comments: Pt w/ submental LAD, submandibular LAD, and pre auricular LAD- TTP ?   Mouth/Throat:  ?   Comments: 2 back teeth of L lower jaw broken and damaged ?Skin: ?   General: Skin is warm and dry.  ?   Findings: No erythema.  ?Neurological:  ?   General: No focal deficit present.  ?   Mental Status: She is alert and oriented to person, place, and time.  ?Psychiatric:     ?   Mood and Affect: Mood normal.     ?   Behavior: Behavior normal.  ? ? ? ? ? ?   ?Assessment & Plan:  ? ?Tooth infxn- new.  Pt reports similar situation on R side late last month that was treated w/ Keflex by her dentist.  She just finished abx on Tuesday.  Yesterday developed L sided tooth and jaw pain.  Noted painful 'lumps' along L lower jaw.  No fever.  Unable to control pain w/ tylenol or ibuprofen.  Will start Augmentin for infxn, Tramadol for pain, increased ice for pain and swelling.  Pt to reach out to dentist for definitive tx.  Pt expressed understanding and is in agreement w/ plan.  ? ?Vaginitis- pt reports she has yeast infxns every time she uses abx.  Preemptive diflucan sent to pharmacy. ?

## 2022-02-10 NOTE — Patient Instructions (Signed)
Follow up as needed or as scheduled ?Call your dentist to see if they can help fix this problem ?START the Augmentin twice daily- take w/ food ?USE the Tramadol as needed for pain ?ICE!!! ?Take the Diflucan as needed ?Call with any questions or concerns ?Hang in there!!! ?

## 2022-03-14 ENCOUNTER — Other Ambulatory Visit: Payer: Self-pay | Admitting: Registered Nurse

## 2022-03-15 MED ORDER — MOUNJARO 2.5 MG/0.5ML ~~LOC~~ SOAJ: 2.5000 mg | SUBCUTANEOUS | 0 refills | Status: DC

## 2022-03-25 ENCOUNTER — Ambulatory Visit
Admission: EM | Admit: 2022-03-25 | Discharge: 2022-03-25 | Disposition: A | Discharge status: Home / Self Care | Payer: 59 | Attending: Internal Medicine | Admitting: Internal Medicine

## 2022-03-25 ENCOUNTER — Telehealth: Payer: Self-pay | Admitting: Registered Nurse

## 2022-03-25 DIAGNOSIS — K047 Periapical abscess without sinus: Secondary | ICD-10-CM | POA: Diagnosis not present

## 2022-03-25 DIAGNOSIS — K0889 Other specified disorders of teeth and supporting structures: Secondary | ICD-10-CM

## 2022-03-25 MED ORDER — AMOXICILLIN-POT CLAVULANATE 875-125 MG PO TABS: 1.0000 | ORAL_TABLET | Freq: Two times a day (BID) | ORAL | 0 refills | Status: DC

## 2022-03-25 NOTE — Telephone Encounter (Signed)
PT stating the her insurance denied her medication (Trizepatide inj). PT want to know what she should do.

## 2022-03-25 NOTE — ED Provider Notes (Signed)
[facility]    CSN: [id number] Arrival date & time: 03/25/22  1655      History   Chief Complaint Chief Complaint  Patient presents with   right jaw edema    HPI Haley Wyatt is a 46 y.o. female.   Patient presents with right lower jaw and dental pain that started yesterday.  Patient reports that she had an abscess in that area a 1-2 months prior and was treated with antibiotics.  She was evaluated by dentist and advised to return for further evaluation but has not done so yet.  Denies any associated fever, body aches, chills.  Denies any purulent drainage from mouth. Patient has not taken any medications for pain.     Past Medical History:  Diagnosis Date   ADD (attention deficit disorder)    ADHD    Allergy    Back pain    "muscle strain from exercising"   Back pain    Borderline diabetes    states "levels have been about 102"   Chest pain    Chronic headaches    Constipation    Fibroid tumor    inside uterus   GERD (gastroesophageal reflux disease)    PT STATES "DOES NOT HAVE IT NOW"   H/O hiatal hernia    Hypertension    NO MEDS.. 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   Morbid obesity ([place]) 07/16/2013   HYPERTENSION 08/16/2010   CHONDROMALACIA PATELLA, 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   LEG EDEMA, 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   GERD 06/11/2010   HYPOTHYROIDISM 04/16/2009   OBESITY 04/16/2009   DEPRESSION 03/30/2009     Past Surgical History:  Procedure Laterality Date   ABDOMINAL HYSTERECTOMY     BILATERAL SALPINGECTOMY Bilateral 01/28/2015   Procedure: BILATERAL SALPINGECTOMY;  Surgeon: Eldred Manges, MD;  Location: [facility];  Service: Gynecology;  Laterality: Bilateral;   CARPAL TUNNEL RELEASE Right    DILITATION & CURRETTAGE/HYSTROSCOPY WITH HYDROTHERMAL ABLATION N/A 04/16/2014   Procedure: DILATATION & CURETTAGE/HYSTEROSCOPY WITH HYDROTHERMAL ABLATION;  Surgeon: Frederico Hamman, MD;  Location: [facility];  Service: Gynecology;  Laterality: N/A;   HIATAL HERNIA REPAIR N/A 02/11/2014   Procedure: LAPAROSCOPIC REPAIR OF HIATAL HERNIA;  Surgeon: Pedro Earls, MD;  Location: [facility];  Service: General;  Laterality: N/A;   LAPAROSCOPIC GASTRIC SLEEVE RESECTION N/A 02/11/2014   Procedure: LAPAROSCOPIC GASTRIC SLEEVE RESECTION;  Surgeon: Pedro Earls, MD;  Location: [facility];  Service: General;  Laterality: N/A;   SHOULDER SURGERY     left shoulder loose body removal   SUPRACERVICAL ABDOMINAL HYSTERECTOMY Bilateral 01/28/2015   Procedure: TOTAL SUPER CERVICAL ABDOMINAL HYSTERECTOMY BILATERAL SALPINGECTOMY;  Surgeon: Eldred Manges, MD;  Location: [facility];  Service: Gynecology;  Laterality: Bilateral;   TUBAL LIGATION     UPPER GI ENDOSCOPY  02/11/2014   Procedure: UPPER GI ENDOSCOPY;  Surgeon: Pedro Earls, MD;  Location: [facility];  Service: General;;  OB History     Gravida  4   Para  4   Term      Preterm      AB      Living  4      SAB      IAB      Ectopic      Multiple      Live Births               Home Medications    Prior to Admission medications   Medication Sig Start Date End Date Taking? Authorizing Provider  amoxicillin-clavulanate (AUGMENTIN) 875-125 MG tablet Take 1 tablet by mouth every 12 (twelve) hours. 03/25/22  Yes Ladarrion Telfair, Hildred Alamin E, FNP  ketoconazole (NIZORAL) 2 % cream Apply 1 application topically daily. 10/12/21   Maximiano Coss, NP  metFORMIN  (GLUCOPHAGE) 1000 MG tablet Take 1 tablet (1,000 mg total) by mouth 2 (two) times daily with a meal. 01/07/22   Maximiano Coss, NP  Multiple Vitamins-Minerals (MULTI-VITE) LIQD Take 30 mLs by mouth daily.    [provider]  tirzepatide Darcel Bayley) 2.5 MG/0.5ML Pen Inject 2.5 mg into the skin once a week. 03/15/22   Maximiano Coss, NP    Family History Family History  Problem Relation Age of Onset   Diverticulitis Mother    Alcoholism Mother    Hyperlipidemia Father    Hypertension Father    Diabetes Father    Schizophrenia Father    Depression Father    Mental illness Sister    Drug abuse Sister    Colon cancer Maternal Grandmother    Diabetes Paternal Grandmother    Sudden death Neg Hx    Heart attack Neg Hx     Social History Social History   Tobacco Use   Smoking status: Former    Types: Cigarettes    Quit date: 02/05/2006    Years since quitting: 16.1   Smokeless tobacco: Never  Vaping Use   Vaping Use: Never used  Substance Use Topics   Alcohol use: Yes    Comment: Occasionally   Drug use: No     Allergies   Patient has no known allergies.   Review of Systems Review of Systems Per HPI  Physical Exam Triage Vital Signs ED Triage Vitals [03/25/22 1718]  Enc Vitals Group     BP 139/87     Pulse Rate 96     Resp 18     Temp 98.3 F (36.8 C)     Temp Source Oral     SpO2 98 %     Weight      Height      Head Circumference      Peak Flow      Pain Score 0     Pain Loc      Pain Edu?      Excl. in [place]?    No data found.  Updated Vital Signs BP 139/87 (BP Location: Left Arm)   Pulse 96   Temp 98.3 F (36.8 C) (Oral)   Resp 18   LMP 04/28/2014   SpO2 98%   Visual Acuity Right Eye Distance:   Left Eye Distance:   Bilateral Distance:    Right Eye Near:   Left Eye Near:    Bilateral Near:     Physical Exam Constitutional:      General: She is not in acute distress.    Appearance: Normal appearance. She is not toxic-appearing  or diaphoretic.  HENT:  Head: Normocephalic and atraumatic.     Mouth/Throat:     Lips: Pink.     Mouth: Mucous membranes are moist.     Dentition: Dental tenderness and gingival swelling present.     Comments: Very mild gingival swelling and erythema located to right lower dentition.  No obvious abscess noted. Eyes:     Extraocular Movements: Extraocular movements intact.     Conjunctiva/sclera: Conjunctivae normal.  Pulmonary:     Effort: Pulmonary effort is normal.  Neurological:     General: No focal deficit present.     Mental Status: She is alert and oriented to person, place, and time. Mental status is at baseline.  Psychiatric:        Mood and Affect: Mood normal.        Behavior: Behavior normal.        Thought Content: Thought content normal.        Judgment: Judgment normal.     UC Treatments / Results  Labs (all labs ordered are listed, but only abnormal results are displayed) Labs Reviewed - No data to display  EKG   Radiology No results found.  Procedures Procedures (including critical care time)  Medications Ordered in UC Medications - No data to display  Initial Impression / Assessment and Plan / UC Course  I have reviewed the triage vital signs and the nursing notes.  Pertinent labs & imaging results that were available during my care of the patient were reviewed by me and considered in my medical decision making (see chart for details).     Will treat dental infection with Augmentin antibiotic.  Discussed supportive care as well.  Patient advised to follow-up with dentist for further evaluation and management.  Patient verbalized understanding and was agreeable with plan. Final Clinical Impressions(s) / UC Diagnoses   Final diagnoses:  Dental infection  Pain, dental     Discharge Instructions      It appears that you have a dental infection.  This is being treated with an antibiotic.  Please follow-up with dentist as soon as possible  for further evaluation and management.    ED Prescriptions     Medication Sig Dispense Auth. Provider   amoxicillin-clavulanate (AUGMENTIN) 875-125 MG tablet Take 1 tablet by mouth every 12 (twelve) hours. 14 tablet [place], Michele Rockers, [place]      PDMP not reviewed this encounter.   Teodora Medici, [place] 03/25/22 1737

## 2022-03-25 NOTE — ED Triage Notes (Signed)
Pt c/o left jaw edema states dentist recommends root canal but have not been able to get it done

## 2022-03-25 NOTE — Discharge Instructions (Signed)
It appears that you have a dental infection.  This is being treated with an antibiotic.  Please follow-up with dentist as soon as possible for further evaluation and management.

## 2022-03-28 ENCOUNTER — Other Ambulatory Visit: Payer: Self-pay

## 2022-03-28 MED ORDER — MOUNJARO 2.5 MG/0.5ML ~~LOC~~ SOAJ: 2.5000 mg | SUBCUTANEOUS | 0 refills | Status: DC

## 2022-03-28 NOTE — Telephone Encounter (Signed)
I have resent the medication to the pharmacy that the PA can start over because the first PA was denied

## 2022-03-30 ENCOUNTER — Ambulatory Visit: Payer: 59 | Admitting: Registered Nurse

## 2022-03-31 ENCOUNTER — Encounter: Payer: Self-pay | Admitting: Family

## 2022-03-31 ENCOUNTER — Ambulatory Visit: Payer: 59 | Admitting: Family Medicine

## 2022-03-31 ENCOUNTER — Ambulatory Visit (INDEPENDENT_AMBULATORY_CARE_PROVIDER_SITE_OTHER): Payer: 59 | Admitting: Family

## 2022-03-31 ENCOUNTER — Telehealth: Payer: Self-pay

## 2022-03-31 VITALS — BP 140/70 | HR 81 | Temp 98.4°F | Ht 68.0 in | Wt 339.0 lb

## 2022-03-31 DIAGNOSIS — F418 Other specified anxiety disorders: Secondary | ICD-10-CM

## 2022-03-31 DIAGNOSIS — R69 Illness, unspecified: Secondary | ICD-10-CM | POA: Diagnosis not present

## 2022-03-31 DIAGNOSIS — R079 Chest pain, unspecified: Secondary | ICD-10-CM | POA: Diagnosis not present

## 2022-03-31 MED ORDER — BUSPIRONE HCL 5 MG PO TABS: 5.0000 mg | ORAL_TABLET | Freq: Three times a day (TID) | ORAL | 0 refills | Status: DC | PRN
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
## 2022-03-31 NOTE — Patient Instructions (Signed)
It was very nice to see you today!  As discussed, follow up with Richard asap for labs and med refills. Ask about starting a low dose blood pressure med which also protects your kidneys from the diabetes.  I have sent over generic Buspar for your anxiety. Start with 1 or 2 doses today, if tolerated, go ahead and take 3 times daily for the next week and then as your anxiety is better controlled you can back off to twice a day.  Follow up with Richard if refill on Buspar needed.   Drink at least 2 liters of water daily to keep your BP and blood sugar down, plus you feel better when well hydrated with water!     PLEASE NOTE:  If you had any lab tests please let us know if you have not heard back within a few days. You may see your results on MyChart before we have a chance to review them but we will give you a call once they are reviewed by [place]. If we ordered any referrals today, please let us know if you have not heard from their office within the next week.

## 2022-03-31 NOTE — Telephone Encounter (Signed)
Attempted to call patient about medication Prior Authorization

## 2022-04-27 ENCOUNTER — Other Ambulatory Visit: Payer: Self-pay | Admitting: Family

## 2022-04-27 DIAGNOSIS — F418 Other specified anxiety disorders: Secondary | ICD-10-CM

## 2022-04-28 MED ORDER — BUSPIRONE HCL 5 MG PO TABS: 5.0000 mg | ORAL_TABLET | Freq: Three times a day (TID) | ORAL | 0 refills | Status: DC | PRN

## 2022-05-02 ENCOUNTER — Encounter: Payer: Self-pay | Admitting: Registered Nurse

## 2022-05-02 ENCOUNTER — Ambulatory Visit (INDEPENDENT_AMBULATORY_CARE_PROVIDER_SITE_OTHER): Payer: 59 | Admitting: Registered Nurse

## 2022-05-02 VITALS — BP 132/80 | HR 78 | Temp 98.7°F | Resp 16 | Ht 68.0 in | Wt 339.0 lb

## 2022-05-02 DIAGNOSIS — N3941 Urge incontinence: Secondary | ICD-10-CM

## 2022-05-02 DIAGNOSIS — N920 Excessive and frequent menstruation with regular cycle: Secondary | ICD-10-CM | POA: Insufficient documentation

## 2022-05-02 DIAGNOSIS — R69 Illness, unspecified: Secondary | ICD-10-CM | POA: Diagnosis not present

## 2022-05-02 DIAGNOSIS — R079 Chest pain, unspecified: Secondary | ICD-10-CM | POA: Diagnosis not present

## 2022-05-02 DIAGNOSIS — F988 Other specified behavioral and emotional disorders with onset usually occurring in childhood and adolescence: Secondary | ICD-10-CM

## 2022-05-02 DIAGNOSIS — D259 Leiomyoma of uterus, unspecified: Secondary | ICD-10-CM | POA: Insufficient documentation

## 2022-05-02 HISTORY — DX: Urge incontinence: N39.41

## 2022-05-02 LAB — COMPREHENSIVE METABOLIC PANEL
ALT: 13 U/L (ref 0–35)
AST: 14 U/L (ref 0–37)
Albumin: 4 g/dL (ref 3.5–5.2)
Alkaline Phosphatase: 63 U/L (ref 39–117)
BUN: 12 mg/dL (ref 6–23)
CO2: 29 mEq/L (ref 19–32)
Calcium: 9.4 mg/dL (ref 8.4–10.5)
Chloride: 103 mEq/L (ref 96–112)
Creatinine, Ser: 0.86 mg/dL (ref 0.40–1.20)
GFR: 80.13 mL/min (ref >60.00)
Glucose, Bld: 91 mg/dL (ref 70–99)
Potassium: 4.3 mEq/L (ref 3.5–5.1)
Sodium: 137 mEq/L (ref 135–145)
Total Bilirubin: 0.4 mg/dL (ref 0.2–1.2)
Total Protein: 7.5 g/dL (ref 6.0–8.3)

## 2022-05-02 LAB — CBC WITH DIFFERENTIAL/PLATELET
Basophils Absolute: 0 10*3/uL (ref 0.0–0.1)
Basophils Relative: 0.3 % (ref 0.0–3.0)
Eosinophils Absolute: 0 10*3/uL (ref 0.0–0.7)
Eosinophils Relative: 0.5 % (ref 0.0–5.0)
HCT: 41.9 % (ref 36.0–46.0)
Hemoglobin: 13.3 g/dL (ref 12.0–15.0)
Lymphocytes Relative: 38.2 % (ref 12.0–46.0)
Lymphs Abs: 3 10*3/uL (ref 0.7–4.0)
MCHC: 31.9 g/dL (ref 30.0–36.0)
MCV: 84.6 fl (ref 78.0–100.0)
Monocytes Absolute: 0.6 10*3/uL (ref 0.1–1.0)
Monocytes Relative: 8.1 % (ref 3.0–12.0)
Neutro Abs: 4.1 10*3/uL (ref 1.4–7.7)
Neutrophils Relative %: 52.9 % (ref 43.0–77.0)
Platelets: 218 10*3/uL (ref 150.0–400.0)
RBC: 4.95 Mil/uL (ref 3.87–5.11)
RDW: 14.8 % (ref 11.5–15.5)
WBC: 7.8 10*3/uL (ref 4.0–10.5)

## 2022-05-02 LAB — URINALYSIS, ROUTINE W REFLEX MICROSCOPIC
Bilirubin Urine: NEGATIVE
Hgb urine dipstick: NEGATIVE
Ketones, ur: NEGATIVE
Leukocytes,Ua: NEGATIVE
Nitrite: NEGATIVE
RBC / HPF: NONE SEEN (ref 0–?)
Specific Gravity, Urine: 1.02 (ref 1.000–1.030)
Total Protein, Urine: NEGATIVE
Urine Glucose: NEGATIVE
Urobilinogen, UA: 1 (ref 0.0–1.0)
pH: 6.5 (ref 5.0–8.0)

## 2022-05-02 LAB — TSH: TSH: 2.07 u[IU]/mL (ref 0.35–5.50)

## 2022-05-02 LAB — LIPID PANEL
Cholesterol: 193 mg/dL (ref 0–200)
HDL: 66.2 mg/dL (ref >39.00)
LDL Cholesterol: 111 mg/dL — ABNORMAL HIGH (ref 0–99)
NonHDL: 126.69
Total CHOL/HDL Ratio: 3
Triglycerides: 80 mg/dL (ref 0.0–149.0)
VLDL: 16 mg/dL (ref 0.0–40.0)

## 2022-05-02 LAB — HEMOGLOBIN A1C: Hgb A1c MFr Bld: 6.3 % (ref 4.6–6.5)

## 2022-05-02 MED ORDER — AMPHETAMINE-DEXTROAMPHET ER 20 MG PO CP24: 20.0000 mg | ORAL_CAPSULE | ORAL | 0 refills | Status: DC

## 2022-05-02 NOTE — Patient Instructions (Signed)
Haley Wyatt -  Will check labs today  No concerns on EKG  Glad things are getting better!  Thanks,  [name]

## 2022-05-02 NOTE — Assessment & Plan Note (Signed)
EKG today, compared to 03/31/22. No ectopic beats today. RRR, no acute abnormalities noted. J point present in a number of leads.  Chest pain has resolved. BP wnl. No ongoing CV concerns at this time

## 2022-05-02 NOTE — Progress Notes (Signed)
Established Patient Office Visit  Subjective:  Patient ID: Haley Wyatt, female    DOB: 1976/10/21  Age: 46 y.o. MRN: [id number]  CC:  Chief Complaint  Patient presents with   Follow-up    Pt states she was seen at one of our other [place] offices they did an EKG and told her to return to our clinic for a f/u EKG and she is requesting blood work     HPI [employer] presents for Follow Up.  Seen on 03/31/22  EKG showed frequent ectopic beats  Otherwise no concerns. Admits a lot of caffeine intake, poor sleep with job pressure.  Has made better habits in the past few weeks No ongoing chest pain or CV symptoms.  She has lost 21 lbs with mounjaro and healthy habits.  Urge incontinence Ongoing. Worse since hysterectomy. Often has leaks No UTI symptoms No concern for STI. Has not taken medication,does not want to.  ADHD Has been self-medicating with caffeine, leading to chest discomfort Would like to go back on vyvanse but not covered by ins. Interested in alternatives.  Outpatient Medications Prior to Visit  Medication Sig Dispense Refill   busPIRone (BUSPAR) 5 MG tablet Take 1-2 tablets (5-10 mg total) by mouth 3 (three) times daily as needed (Take 3 times daily for 1-2 weeks at least, then can back down to twice a day or daily use.). 60 tablet 0   ketoconazole (NIZORAL) 2 % cream Apply 1 application topically daily. 30 g 3   metFORMIN (GLUCOPHAGE) 1000 MG tablet Take 1 tablet (1,000 mg total) by mouth 2 (two) times daily with a meal. 180 tablet 3   Multiple Vitamins-Minerals (MULTI-VITE) LIQD Take 30 mLs by mouth daily.     tirzepatide [facility]) 2.5 MG/0.5ML Pen Inject 2.5 mg into the skin once a week. 4 mL 0   amoxicillin-clavulanate (AUGMENTIN) 875-125 MG tablet Take 1 tablet by mouth every 12 (twelve) hours. (Patient not taking: Reported on 05/02/2022) 14 tablet 0   No facility-administered medications prior to visit.    Review of Systems  Constitutional:  Negative.   HENT: Negative.    Eyes: Negative.   Respiratory: Negative.    Cardiovascular: Negative.   Gastrointestinal: Negative.   Genitourinary: Negative.   Musculoskeletal: Negative.   Skin: Negative.   Neurological: Negative.   Psychiatric/Behavioral: Negative.    All other systems reviewed and are negative.     Objective:     BP 132/80   Pulse 78   Temp 98.7 F (37.1 C) (Temporal)   Resp 16   Ht '5\' 8"'$  (1.727 m)   Wt (!) 339 lb (153.8 kg)   LMP 04/28/2014   SpO2 99%   BMI 51.54 kg/m   Wt Readings from Last 3 Encounters:  05/02/22 (!) 339 lb (153.8 kg)  03/31/22 (!) 339 lb (153.8 kg)  02/10/22 (!) 340 lb 6.4 oz (154.4 kg)   Physical Exam Vitals and nursing note reviewed.  Constitutional:      General: She is not in acute distress.    Appearance: Normal appearance. She is normal weight. She is not ill-appearing, toxic-appearing or diaphoretic.  Cardiovascular:     Rate and Rhythm: Normal rate and regular rhythm.     Heart sounds: Normal heart sounds. No murmur heard.    No friction rub. No gallop.  Pulmonary:     Effort: Pulmonary effort is normal. No respiratory distress.     Breath sounds: Normal breath sounds. No stridor. No wheezing, rhonchi or rales.  Chest:     Chest wall: No tenderness.  Skin:    General: Skin is warm and dry.  Neurological:     General: No focal deficit present.     Mental Status: She is alert and oriented to person, place, and time. Mental status is at baseline.  Psychiatric:        Mood and Affect: Mood normal.        Behavior: Behavior normal.        Thought Content: Thought content normal.        Judgment: Judgment normal.     No results found for any visits on 05/02/22.    The 10-year ASCVD risk score (Arnett DK, et al., 2019) is: 0.8%    Assessment & Plan:   Problem List Items Addressed This Visit       Other   Chest pain    EKG today, compared to 03/31/22. No ectopic beats today. RRR, no acute abnormalities  noted. J point present in a number of leads.  Chest pain has resolved. BP wnl. No ongoing CV concerns at this time      Relevant Orders   EKG 12-Lead (Completed)   CBC with Differential/Platelet   Comprehensive metabolic panel   Hemoglobin A1c   Lipid panel   TSH   Urinalysis, Routine w reflex microscopic   Attention deficit disorder - Primary    Start adderall '20mg'$  ER po qd. Reviewed risks, benefits, and side effects, pt voices understanding. Med check in 3 weeks via MyChart message      Relevant Medications   amphetamine-dextroamphetamine (ADDERALL XR) 20 MG 24 hr capsule   Urge incontinence    Refer to PT for pelvic floor therapy Consider urology referral or med intervention      Relevant Orders   Ambulatory referral to Physical Therapy   CBC with Differential/Platelet   Comprehensive metabolic panel   Hemoglobin A1c   Lipid panel   TSH   Urinalysis, Routine w reflex microscopic    Meds ordered this encounter  Medications   amphetamine-dextroamphetamine (ADDERALL XR) 20 MG 24 hr capsule    Sig: Take 1 capsule (20 mg total) by mouth every morning.    Dispense:  30 capsule    Refill:  0    Order Specific Question:   Supervising Provider    Answer:   Carlota Raspberry, JEFFREY R [[id number]]    Return if symptoms worsen or fail to improve.    Maximiano Coss, NP

## 2022-05-02 NOTE — Assessment & Plan Note (Signed)
Refer to PT for pelvic floor therapy Consider urology referral or med intervention

## 2022-05-02 NOTE — Assessment & Plan Note (Signed)
Start adderall '20mg'$  ER po qd. Reviewed risks, benefits, and side effects, pt voices understanding. Med check in 3 weeks via MyChart message

## 2022-05-04 ENCOUNTER — Other Ambulatory Visit: Payer: Self-pay

## 2022-05-04 ENCOUNTER — Telehealth: Payer: Self-pay

## 2022-05-04 DIAGNOSIS — F988 Other specified behavioral and emotional disorders with onset usually occurring in childhood and adolescence: Secondary | ICD-10-CM

## 2022-05-04 NOTE — Telephone Encounter (Signed)
Pt reports on back order from original pharmacy. Would like sent to [facility] [address] pt reports they do have stock in

## 2022-05-05 ENCOUNTER — Telehealth: Payer: Self-pay | Admitting: Registered Nurse

## 2022-05-05 NOTE — Telephone Encounter (Signed)
No additional notes required  

## 2022-05-05 NOTE — Telephone Encounter (Signed)
PT called stating that her pharmacy is out of her medication Adderall 20 mg. [facility] [address] [facility]. Pt [facility] [address]

## 2022-05-06 ENCOUNTER — Encounter: Payer: Self-pay | Admitting: Registered Nurse

## 2022-05-06 ENCOUNTER — Other Ambulatory Visit: Payer: Self-pay | Admitting: Registered Nurse

## 2022-05-06 DIAGNOSIS — F988 Other specified behavioral and emotional disorders with onset usually occurring in childhood and adolescence: Secondary | ICD-10-CM

## 2022-05-09 NOTE — Telephone Encounter (Signed)
Encourage patient to contact the pharmacy for refills or they can request refills through [facility]  (Please schedule appointment if patient has not been seen in over a year)    WHAT PHARMACY WOULD THEY LIKE THIS SENT TO: [facility] NAME & DOSE: Addreall xr 20 mg  NOTES/COMMENTS FROM PATIENT:      [place] office please notify patient: It takes 48-72 hours to process rx refill requests Ask patient to call pharmacy to ensure rx is ready before heading there.

## 2022-05-10 MED ORDER — AMPHETAMINE-DEXTROAMPHET ER 20 MG PO CP24: 20.0000 mg | ORAL_CAPSULE | ORAL | 0 refills | Status: DC

## 2022-05-11 ENCOUNTER — Other Ambulatory Visit: Payer: Self-pay | Admitting: Registered Nurse

## 2022-05-11 ENCOUNTER — Telehealth: Payer: Self-pay | Admitting: Registered Nurse

## 2022-05-11 DIAGNOSIS — F988 Other specified behavioral and emotional disorders with onset usually occurring in childhood and adolescence: Secondary | ICD-10-CM

## 2022-05-11 MED ORDER — AMPHETAMINE-DEXTROAMPHET ER 20 MG PO CP24: 20.0000 mg | ORAL_CAPSULE | ORAL | 0 refills | Status: DC

## 2022-05-11 NOTE — Telephone Encounter (Signed)
Encourage patient to contact the pharmacy for refills or they can request refills through [facility]  (Please schedule appointment if patient has not been seen in over a year)    WHAT PHARMACY WOULD THEY LIKE THIS SENT TO: [place] [phone]  MEDICATION NAME & DOSE:amphetamine-dextroamphetamine 20 mg TABLETS (not capsules)   NOTES/COMMENTS FROM PATIENT: Pt is completely out and has been out for "a while".  PLEASE CALL PT ONCE CALLED IN [phone]      Front office please notify patient: It takes 48-72 hours to process rx refill requests Ask patient to call pharmacy to ensure rx is ready before heading there.

## 2022-05-11 NOTE — Telephone Encounter (Signed)
Sent ? ?Thanks, ? ?Rich

## 2022-05-12 ENCOUNTER — Ambulatory Visit: Payer: 59 | Attending: Registered Nurse | Admitting: Physical Therapy

## 2022-05-12 NOTE — Telephone Encounter (Signed)
Pt called back. Pharmacy was out when she got there (for amphetamine-dextroamphetamine 20 mg tablets).  Pt wondering if she can have vyvance until she can get the other filled. Please send to [facility] on [address]

## 2022-06-01 ENCOUNTER — Encounter (INDEPENDENT_AMBULATORY_CARE_PROVIDER_SITE_OTHER): Payer: Self-pay

## 2022-06-09 ENCOUNTER — Ambulatory Visit: Payer: 59 | Attending: Registered Nurse | Admitting: Physical Therapy

## 2022-06-09 ENCOUNTER — Telehealth: Payer: Self-pay | Admitting: Registered Nurse

## 2022-06-09 ENCOUNTER — Other Ambulatory Visit: Payer: Self-pay

## 2022-06-09 DIAGNOSIS — R279 Unspecified lack of coordination: Secondary | ICD-10-CM | POA: Diagnosis not present

## 2022-06-09 DIAGNOSIS — N3941 Urge incontinence: Secondary | ICD-10-CM | POA: Insufficient documentation

## 2022-06-09 DIAGNOSIS — R293 Abnormal posture: Secondary | ICD-10-CM | POA: Insufficient documentation

## 2022-06-09 DIAGNOSIS — M6281 Muscle weakness (generalized): Secondary | ICD-10-CM | POA: Insufficient documentation

## 2022-06-09 NOTE — Patient Instructions (Signed)
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
## 2022-06-09 NOTE — Telephone Encounter (Signed)
   LAST APPOINTMENT DATE:   03/31/22 Same Day  NEXT APPOINTMENT DATE: 06/15/22 TOC from [place]: amphetamine-dextroamphetamine (ADDERALL XR) 20 MG 24 hr capsule [[id number]]     Is the patient out of medication?  Almost, first missed dose will be 06/13/22    PHARMACY: [id number] - [place], [place] - [address], [place] [address]  Phone:  [phone]  Fax:  [phone]

## 2022-06-14 ENCOUNTER — Other Ambulatory Visit: Payer: Self-pay | Admitting: Family

## 2022-06-14 DIAGNOSIS — F988 Other specified behavioral and emotional disorders with onset usually occurring in childhood and adolescence: Secondary | ICD-10-CM

## 2022-06-14 MED ORDER — AMPHETAMINE-DEXTROAMPHET ER 20 MG PO CP24: 20.0000 mg | ORAL_CAPSULE | ORAL | 0 refills | Status: DC

## 2022-06-15 ENCOUNTER — Ambulatory Visit (INDEPENDENT_AMBULATORY_CARE_PROVIDER_SITE_OTHER): Payer: 59 | Admitting: Family

## 2022-06-15 ENCOUNTER — Encounter: Payer: Self-pay | Admitting: Family

## 2022-06-15 ENCOUNTER — Ambulatory Visit: Payer: 59 | Admitting: Physical Therapy

## 2022-06-15 DIAGNOSIS — R7303 Prediabetes: Secondary | ICD-10-CM | POA: Insufficient documentation

## 2022-06-15 DIAGNOSIS — R279 Unspecified lack of coordination: Secondary | ICD-10-CM | POA: Diagnosis not present

## 2022-06-15 DIAGNOSIS — F988 Other specified behavioral and emotional disorders with onset usually occurring in childhood and adolescence: Secondary | ICD-10-CM | POA: Diagnosis not present

## 2022-06-15 DIAGNOSIS — E1169 Type 2 diabetes mellitus with other specified complication: Secondary | ICD-10-CM | POA: Diagnosis not present

## 2022-06-15 DIAGNOSIS — R293 Abnormal posture: Secondary | ICD-10-CM | POA: Diagnosis not present

## 2022-06-15 DIAGNOSIS — R69 Illness, unspecified: Secondary | ICD-10-CM | POA: Diagnosis not present

## 2022-06-15 DIAGNOSIS — N3941 Urge incontinence: Secondary | ICD-10-CM | POA: Diagnosis not present

## 2022-06-15 DIAGNOSIS — M6281 Muscle weakness (generalized): Secondary | ICD-10-CM | POA: Diagnosis not present

## 2022-06-15 MED ORDER — TIRZEPATIDE 5 MG/0.5ML ~~LOC~~ SOAJ: 5.0000 mg | SUBCUTANEOUS | 0 refills | Status: DC

## 2022-06-15 MED ORDER — LISDEXAMFETAMINE DIMESYLATE 30 MG PO CAPS: 30.0000 mg | ORAL_CAPSULE | Freq: Every day | ORAL | 0 refills | Status: DC
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
## 2022-06-15 NOTE — Assessment & Plan Note (Signed)
   Chronic  Patient started on Adderall 20 mg ER last month, patient reports it not working well, causing irritability, sleeplessness  Patient would like to go back on Vyvanse 30 mg  Sending today, starting PA  Follow-up in 3 months

## 2022-06-15 NOTE — Patient Instructions (Signed)
It was very nice to see you today!   I have sent over Vyvanse RX to your pharmacy, you may not be able to pick up for a few days until prior auth is approved.   I also sent over a refill for the [facility] '5mg'$ , this too, may require a PA. Go ahead and finish the 2.'5mg'$  doses that you have.  Schedule a follow up visit for 3 months.  Have a great day :-)     PLEASE NOTE:  If you had any lab tests please let us know if you have not heard back within a few days. You may see your results on MyChart before we have a chance to review them but we will give you a call once they are reviewed by [place]. If we ordered any referrals today, please let us know if you have not heard from their office within the next week.

## 2022-06-15 NOTE — Progress Notes (Signed)
New Patient Office Visit  Subjective:  Patient ID: Haley Wyatt, female    DOB: September 30, 1976  Age: 46 y.o. MRN: [id number]  CC:  Chief Complaint  Patient presents with   Transfer of care    No concerns   ADHD    Discuss Vyvanse    HPI Haley Wyatt presents for establishing care today.   T2DM: Pt is currently maintained on the following medications for diabetes: Metformin, Mounjaro Failed meds include: none Denies polyuria/polydipsia/polyphagia Denies hypoglycemia Home glucose readings range: Not currently checking Last A1C was  Lab Results  Component Value Date   HGBA1C 6.3 05/02/2022   HGBA1C 6.3 (H) 08/18/2020   HGBA1C 6.1 (A) 12/11/2019  ADHD f/u: Medications helping target goals: Adderall '20mg'$ ER Regimen: daily Medication side effects/concerns: pt feels more irritable, and when off on w/e and restarts on Monday, she is wired all day & can't go to sleep easily at night Weight: No change Sleep: Sometimes Mood changes: More irritable when med is wearing off Tics: Denies Blood pressure, Weight, Pulse, Behavior reviewed: wnl   Assessment & Plan:   Problem List Items Addressed This Visit       Endocrine   Type 2 diabetes mellitus with morbid obesity ([place]) - Primary   Relevant Medications   tirzepatide (MOUNJARO) 5 MG/0.5ML Pen              Other   Attention deficit disorder    Chronic Patient started on Adderall 20 mg ER last month, patient reports it not working well, causing irritability, sleeplessness Patient would like to go back on Vyvanse 30 mg Sending today, starting PA Follow-up in 3 months      Relevant Medications   lisdexamfetamine (VYVANSE) 30 MG capsule   lisdexamfetamine (VYVANSE) 30 MG capsule (Start on 07/15/2022)   lisdexamfetamine (VYVANSE) 30 MG capsule (Start on 08/14/2022)   Subjective:    Outpatient Medications Prior to Visit  Medication Sig Dispense Refill   busPIRone (BUSPAR) 5 MG tablet Take 1-2 tablets (5-10 mg total)  by mouth 3 (three) times daily as needed (Take 3 times daily for 1-2 weeks at least, then can back down to twice a day or daily use.). 60 tablet 0   ketoconazole (NIZORAL) 2 % cream Apply 1 application topically daily. 30 g 3   metFORMIN (GLUCOPHAGE) 1000 MG tablet Take 1 tablet (1,000 mg total) by mouth 2 (two) times daily with a meal. 180 tablet 3   Multiple Vitamins-Minerals (MULTI-VITE) LIQD Take 30 mLs by mouth daily.     amphetamine-dextroamphetamine (ADDERALL XR) 20 MG 24 hr capsule Take 1 capsule (20 mg total) by mouth every morning. (Patient not taking: Reported on 06/15/2022) 30 capsule 0   tirzepatide (MOUNJARO) 2.5 MG/0.5ML Pen Inject 2.5 mg into the skin once a week. (Patient not taking: Reported on 06/15/2022) 4 mL 0   No facility-administered medications prior to visit.   Past Medical History:  Diagnosis Date   ADD (attention deficit disorder)    ADHD    Allergy    Back pain    "muscle strain from exercising"   Back pain    Borderline diabetes    states "levels have been about 102"   Chest pain    Chronic headaches    Constipation    Fibroid tumor    inside uterus   GERD 06/11/2010   Qualifier: Diagnosis of  By: Dianah Field MD, Marcello Moores     GERD (gastroesophageal reflux disease)    PT STATES "DOES NOT HAVE  IT NOW"   H/O hiatal hernia    Hypertension    NO MEDS.. BP CONTROLLED   Hypothyroidism    HYPOTHYROIDISM 04/16/2009   Qualifier: Diagnosis of  By: Dianah Field MD, Thomas     Joint pain    LEG EDEMA, 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   Qualifier: Diagnosis of  By: Dianah Field MD, Thomas     OBESITY 04/16/2009   Other fatigue 08/18/2020   Sedimentation rate elevation 06/23/2021   SOB (shortness of breath) on exertion    Swelling of both lower extremities    Past Surgical History:  Procedure Laterality Date   ABDOMINAL HYSTERECTOMY     BILATERAL SALPINGECTOMY Bilateral 01/28/2015   Procedure: BILATERAL SALPINGECTOMY;  Surgeon: Eldred Manges, MD;  Location: [facility];   Service: Gynecology;  Laterality: Bilateral;   CARPAL TUNNEL RELEASE Right    DILITATION & CURRETTAGE/HYSTROSCOPY WITH HYDROTHERMAL ABLATION N/A 04/16/2014   Procedure: DILATATION & CURETTAGE/HYSTEROSCOPY WITH HYDROTHERMAL ABLATION;  Surgeon: Frederico Hamman, MD;  Location: [facility];  Service: Gynecology;  Laterality: N/A;   HIATAL HERNIA REPAIR N/A 02/11/2014   Procedure: LAPAROSCOPIC REPAIR OF HIATAL HERNIA;  Surgeon: Pedro Earls, MD;  Location: [facility];  Service: General;  Laterality: N/A;   LAPAROSCOPIC GASTRIC SLEEVE RESECTION N/A 02/11/2014   Procedure: LAPAROSCOPIC GASTRIC SLEEVE RESECTION;  Surgeon: Pedro Earls, MD;  Location: [facility];  Service: General;  Laterality: N/A;   SHOULDER SURGERY     left shoulder loose body removal   SUPRACERVICAL ABDOMINAL HYSTERECTOMY Bilateral 01/28/2015   Procedure: TOTAL SUPER CERVICAL ABDOMINAL HYSTERECTOMY BILATERAL SALPINGECTOMY;  Surgeon: Eldred Manges, MD;  Location: [facility];  Service: Gynecology;  Laterality: Bilateral;   TUBAL LIGATION     UPPER GI ENDOSCOPY  02/11/2014   Procedure: UPPER GI ENDOSCOPY;  Surgeon: Pedro Earls, MD;  Location: [facility];  Service: General;;    Objective:   Today's Vitals: BP (!) 132/94 (BP Location: Left Arm, Patient Position: Sitting, Cuff Size: Large)   Pulse 70   Temp 98 F (36.7 C) (Temporal)   Ht '5\' 8"'$  (1.727 m)   Wt (!) 332 lb (150.6 kg)   LMP 04/28/2014   SpO2 97%   BMI 50.48 kg/m   Physical Exam Vitals and nursing note reviewed.  Constitutional:      Appearance: Normal appearance. She is obese.  Cardiovascular:     Rate and Rhythm: Normal rate and regular rhythm.  Pulmonary:     Effort: Pulmonary effort is normal.     Breath sounds: Normal breath sounds.  Musculoskeletal:        General: Normal range of motion.  Skin:    General: Skin is warm and dry.  Neurological:     Mental Status: She is alert.  Psychiatric:        Mood and Affect: Mood normal.        Behavior: Behavior  normal.     Meds ordered this encounter  Medications   tirzepatide (MOUNJARO) 5 MG/0.5ML Pen    Sig: Inject 5 mg into the skin once a week.    Dispense:  6 mL    Refill:  0    Order Specific Question:   Supervising Provider    Answer:   ANDY, CAMILLE L [[id number]]   lisdexamfetamine (VYVANSE) 30 MG capsule    Sig: Take 1 capsule (30 mg total) by mouth daily before breakfast.    Dispense:  30 capsule    Refill:  0    Order Specific Question:  Supervising Provider    Answer:   Jonni Sanger, CAMILLE L [[id number]]   lisdexamfetamine (VYVANSE) 30 MG capsule    Sig: Take 1 capsule (30 mg total) by mouth daily before breakfast.    Dispense:  30 capsule    Refill:  0    Order Specific Question:   Supervising Provider    Answer:   ANDY, CAMILLE L [[id number]]   lisdexamfetamine (VYVANSE) 30 MG capsule    Sig: Take 1 capsule (30 mg total) by mouth daily before breakfast.    Dispense:  30 capsule    Refill:  0    Order Specific Question:   Supervising Provider    Answer:   ANDY, CAMILLE L [[id number]]    Jeanie Sewer, NP

## 2022-06-22 ENCOUNTER — Other Ambulatory Visit: Payer: Self-pay | Admitting: Family Medicine

## 2022-06-22 DIAGNOSIS — F418 Other specified anxiety disorders: Secondary | ICD-10-CM

## 2022-06-23 MED ORDER — BUSPIRONE HCL 5 MG PO TABS: 5.0000 mg | ORAL_TABLET | Freq: Three times a day (TID) | ORAL | 0 refills | Status: DC | PRN

## 2022-07-04 ENCOUNTER — Ambulatory Visit: Payer: 59 | Attending: Registered Nurse | Admitting: Physical Therapy

## 2022-07-04 DIAGNOSIS — R279 Unspecified lack of coordination: Secondary | ICD-10-CM | POA: Insufficient documentation

## 2022-07-04 DIAGNOSIS — M6281 Muscle weakness (generalized): Secondary | ICD-10-CM | POA: Insufficient documentation

## 2022-07-04 DIAGNOSIS — N3941 Urge incontinence: Secondary | ICD-10-CM | POA: Insufficient documentation

## 2022-07-04 DIAGNOSIS — R293 Abnormal posture: Secondary | ICD-10-CM | POA: Insufficient documentation

## 2022-07-13 ENCOUNTER — Encounter: Payer: Self-pay | Admitting: Family

## 2022-07-13 DIAGNOSIS — F988 Other specified behavioral and emotional disorders with onset usually occurring in childhood and adolescence: Secondary | ICD-10-CM

## 2022-07-18 ENCOUNTER — Encounter: Payer: Self-pay | Admitting: *Deleted

## 2022-07-18 ENCOUNTER — Telehealth: Payer: Self-pay

## 2022-07-18 NOTE — Telephone Encounter (Signed)
PA for [facility] '5MG'$ /0.5ML pen-injectors again after appeal.  (Key: [id number])

## 2022-07-19 ENCOUNTER — Ambulatory Visit: Payer: 59 | Admitting: Physical Therapy

## 2022-07-19 ENCOUNTER — Telehealth: Payer: Self-pay | Admitting: Physical Therapy

## 2022-07-19 NOTE — Telephone Encounter (Signed)
PA was denied, sorry.

## 2022-07-19 NOTE — Telephone Encounter (Signed)
PT called pt about this morning's appointment at 1015. Pt did not answer, voicemail left.    This is pt's second no show for appointment, discussed attendance policy in voicemail and to cancel/reschedule appointments with at least 24 hour notice in future.   Stacy Gardner, PT, DPT 07/19/2310:21 AM

## 2022-07-19 NOTE — Telephone Encounter (Signed)
was this rejected or you're letting me know you appealed?

## 2022-07-24 MED ORDER — AMPHETAMINE-DEXTROAMPHET ER 20 MG PO CP24: 20.0000 mg | ORAL_CAPSULE | ORAL | 0 refills | Status: DC

## 2022-07-24 NOTE — Telephone Encounter (Signed)
Adderall '20mg'$  XR sent to [facility]

## 2022-07-26 ENCOUNTER — Ambulatory Visit: Payer: 59 | Admitting: Physical Therapy

## 2022-07-27 ENCOUNTER — Telehealth: Payer: Self-pay | Admitting: Physical Therapy

## 2022-07-27 NOTE — Telephone Encounter (Signed)
Attempted to call pt about future appointments and clinic's attendance policy. Pt's mailbox is full and unable to leave message. Pt has no showed/cancelled with less than 24 hour notice at least 2 times, per clinic policy if pt unable to make future appointments, unfortunately pt will have to be discharged from PT. Will discuss in person at next appointment as pt mailbox full.   Stacy Gardner, PT, DPT 10/04/234:39 PM

## 2022-08-02 ENCOUNTER — Telehealth: Payer: Self-pay | Admitting: Physical Therapy

## 2022-08-02 ENCOUNTER — Ambulatory Visit: Payer: 59 | Attending: Registered Nurse | Admitting: Physical Therapy

## 2022-08-02 ENCOUNTER — Encounter: Payer: Self-pay | Admitting: Physical Therapy

## 2022-08-02 DIAGNOSIS — R293 Abnormal posture: Secondary | ICD-10-CM | POA: Insufficient documentation

## 2022-08-02 DIAGNOSIS — N3941 Urge incontinence: Secondary | ICD-10-CM | POA: Insufficient documentation

## 2022-08-02 DIAGNOSIS — R279 Unspecified lack of coordination: Secondary | ICD-10-CM | POA: Insufficient documentation

## 2022-08-02 DIAGNOSIS — M6281 Muscle weakness (generalized): Secondary | ICD-10-CM | POA: Insufficient documentation

## 2022-08-02 NOTE — Telephone Encounter (Signed)
PT called pt about this morning's appointment at 1015. Pt did not answer, voicemail box is full and unable to leave a message. PT sent patient a MyChart message. This appointment was pt's fourth no show and/or cancellation with less than 24 hour notice. Multiple attempts made to reach pt to inform her of this however pt's mailbox has been full. Unfortunately, pt will be discharged from PT at this time due to attendance policy. Pt will need a new referral for additional PT needs in the future.   Stacy Gardner, PT, DPT 08/02/2309:35 AM

## 2022-08-09 ENCOUNTER — Ambulatory Visit: Payer: 59 | Admitting: Physical Therapy

## 2022-08-16 ENCOUNTER — Encounter: Payer: 59 | Admitting: Physical Therapy

## 2022-08-21 ENCOUNTER — Other Ambulatory Visit: Payer: Self-pay | Admitting: Family

## 2022-08-21 DIAGNOSIS — F418 Other specified anxiety disorders: Secondary | ICD-10-CM

## 2022-08-23 ENCOUNTER — Encounter: Payer: Self-pay | Admitting: Family

## 2022-08-23 ENCOUNTER — Encounter: Payer: 59 | Admitting: Physical Therapy

## 2022-08-23 DIAGNOSIS — F418 Other specified anxiety disorders: Secondary | ICD-10-CM

## 2022-08-30 ENCOUNTER — Encounter: Payer: 59 | Admitting: Physical Therapy

## 2022-08-30 MED ORDER — TRULICITY 0.75 MG/0.5ML ~~LOC~~ SOAJ: 0.7500 mg | SUBCUTANEOUS | 0 refills | Status: DC

## 2022-08-30 MED ORDER — BUSPIRONE HCL 5 MG PO TABS: 5.0000 mg | ORAL_TABLET | Freq: Three times a day (TID) | ORAL | 2 refills | Status: DC | PRN

## 2022-09-21 ENCOUNTER — Other Ambulatory Visit: Payer: Self-pay | Admitting: Family

## 2022-09-24 ENCOUNTER — Other Ambulatory Visit: Payer: Self-pay | Admitting: Family

## 2022-09-24 DIAGNOSIS — F988 Other specified behavioral and emotional disorders with onset usually occurring in childhood and adolescence: Secondary | ICD-10-CM

## 2022-09-25 ENCOUNTER — Other Ambulatory Visit: Payer: Self-pay | Admitting: Family

## 2022-09-25 DIAGNOSIS — F988 Other specified behavioral and emotional disorders with onset usually occurring in childhood and adolescence: Secondary | ICD-10-CM

## 2022-09-25 MED ORDER — AMPHETAMINE-DEXTROAMPHET ER 20 MG PO CP24: 20.0000 mg | ORAL_CAPSULE | ORAL | 0 refills | Status: DC

## 2022-09-25 NOTE — Telephone Encounter (Signed)
RX sent, but she needs an appt next month, has been 3 mos since last visit, thx.

## 2022-10-05 ENCOUNTER — Telehealth: Payer: Self-pay | Admitting: Family
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
## 2022-10-06 ENCOUNTER — Encounter: Payer: Self-pay | Admitting: *Deleted

## 2022-10-10 ENCOUNTER — Other Ambulatory Visit: Payer: Self-pay

## 2022-10-10 DIAGNOSIS — F418 Other specified anxiety disorders: Secondary | ICD-10-CM

## 2022-10-10 DIAGNOSIS — R7303 Prediabetes: Secondary | ICD-10-CM

## 2022-10-10 MED ORDER — BUSPIRONE HCL 5 MG PO TABS: 5.0000 mg | ORAL_TABLET | Freq: Three times a day (TID) | ORAL | 2 refills | Status: DC | PRN

## 2022-10-10 MED ORDER — METFORMIN HCL 1000 MG PO TABS: 1000.0000 mg | ORAL_TABLET | Freq: Two times a day (BID) | ORAL | 3 refills | Status: DC

## 2022-10-10 NOTE — Telephone Encounter (Signed)
can send refills & put in comments, pt lost bottles- can't refill adderall until due again

## 2022-10-10 NOTE — Telephone Encounter (Signed)
RX Sent. 

## 2022-11-07 ENCOUNTER — Encounter: Payer: Self-pay | Admitting: Family

## 2022-11-07 ENCOUNTER — Ambulatory Visit (INDEPENDENT_AMBULATORY_CARE_PROVIDER_SITE_OTHER): Payer: 59 | Admitting: Family

## 2022-11-07 DIAGNOSIS — F988 Other specified behavioral and emotional disorders with onset usually occurring in childhood and adolescence: Secondary | ICD-10-CM

## 2022-11-07 DIAGNOSIS — E1169 Type 2 diabetes mellitus with other specified complication: Secondary | ICD-10-CM | POA: Diagnosis not present

## 2022-11-07 MED ORDER — LISDEXAMFETAMINE DIMESYLATE 30 MG PO CAPS: 30.0000 mg | ORAL_CAPSULE | Freq: Every day | ORAL | 0 refills | Status: DC

## 2022-11-07 MED ORDER — TIRZEPATIDE 7.5 MG/0.5ML ~~LOC~~ SOAJ: 7.5000 mg | SUBCUTANEOUS | 0 refills | Status: DC

## 2022-11-07 NOTE — Assessment & Plan Note (Signed)
Chronic Patient started on Adderall 20 mg ER in July, patient still reports it not working well, causing irritability, sleeplessness Patient would like to go back on Vyvanse 30 mg Sending today as now generic Follow-up in 3 months

## 2022-11-07 NOTE — Patient Instructions (Addendum)
It was very nice to see you today!   I sent the higher dose of Mounjaro to your mailorder pharmacy, 7.'5mg'$  Haley Wyatt, it may take a few days for prior approval.  Great job in losing weight so far! Keep up the good work! As discussed, since I am sure your A1C is lower, you can cut the Metformin in half and take 1/2 pill twice a day or 1 pill daily.  Let me know if any problems finding the generic Vyvanse.  Schedule a 3 month follow up visit with fasting labs.      PLEASE NOTE:  If you had any lab tests please let us know if you have not heard back within a few days. You may see your results on MyChart before we have a chance to review them but we will give you a call once they are reviewed by [place]. If we ordered any referrals today, please let us know if you have not heard from their office within the next week.

## 2022-11-07 NOTE — Assessment & Plan Note (Addendum)
chronic reports tolerating Mounjaro and had weight loss ins. would not cover, sent Trulicity & pt reports it causing stomach upset ok to cut Metformin to 1000 qd from bid she used Mounjaro for 3 mos with online coupon will try and send again with PA f/u 4 mos

## 2022-11-07 NOTE — Progress Notes (Signed)
Patient ID: Haley Wyatt, female    DOB: [birth year], 47 y.o.   MRN: [id number]  Chief Complaint  Patient presents with   Weight Loss    Discuss medications, Pt states Trulicity makes her stomach hurt.   ADHD    Discuss Vyvanse    HPI: ADHD f/u: Medications helping target goals: Adderall '20mg'$ ER Regimen: daily Medication side effects/concerns: pt feels more irritable, and when off on w/e and restarts on Monday, she is wired all day & can't go to sleep easily at night Weight: No change Sleep: Sometimes Mood changes: More irritable when med is wearing off Tics: Denies Blood pressure, Weight, Pulse, Behavior reviewed: wnl  T2DM: Pt is currently maintained on the following medications for diabetes: Metformin, Mounjaro Failed meds include: none Denies polyuria/polydipsia/polyphagia Denies hypoglycemia Home glucose readings range: Not currently checking  Assessment & Plan:   Problem List Items Addressed This Visit       Endocrine   Type 2 diabetes mellitus with morbid obesity ([place]) - Primary    chronic reports tolerating Mounjaro and had weight loss ins. would not cover, sent Trulicity & pt reports it causing stomach upset ok to cut Metformin to 1000 qd from bid she used Mounjaro for 3 mos with online coupon will try and send again with PA f/u 4 mos      Relevant Medications            tirzepatide (MOUNJARO) 7.5 MG/0.5ML Pen   Other Relevant Orders   HM DIABETES FOOT EXAM (Completed)     Other   Morbid obesity ([place])   Relevant Medications   lisdexamfetamine (VYVANSE) 30 MG capsule   lisdexamfetamine (VYVANSE) 30 MG capsule (Start on 12/07/2022)   lisdexamfetamine (VYVANSE) 30 MG capsule (Start on 01/06/2023)   tirzepatide (MOUNJARO) 7.5 MG/0.5ML Pen   Attention deficit disorder    Chronic Patient started on Adderall 20 mg ER in July, patient still reports it not working well, causing irritability, sleeplessness Patient would like to go back on Vyvanse 30  mg Sending today as now generic Follow-up in 3 months      Relevant Medications   lisdexamfetamine (VYVANSE) 30 MG capsule   lisdexamfetamine (VYVANSE) 30 MG capsule (Start on 12/07/2022)   lisdexamfetamine (VYVANSE) 30 MG capsule (Start on 01/06/2023)    Subjective:    Outpatient Medications Prior to Visit  Medication Sig Dispense Refill   busPIRone (BUSPAR) 5 MG tablet Take 1-2 tablets (5-10 mg total) by mouth 3 (three) times daily as needed (Take 3 times daily for 1-2 weeks at least, then can back down to twice a day or daily use.). 60 tablet 2   ketoconazole (NIZORAL) 2 % cream Apply 1 application topically daily. 30 g 3   metFORMIN (GLUCOPHAGE) 1000 MG tablet Take 1 tablet (1,000 mg total) by mouth 2 (two) times daily with a meal. 180 tablet 3   Multiple Vitamins-Minerals (MULTI-VITE) LIQD Take 30 mLs by mouth daily.     amphetamine-dextroamphetamine (ADDERALL XR) 20 MG 24 hr capsule Take 1 capsule (20 mg total) by mouth every morning. 30 capsule 0   TRULICITY 1.09 NA/3.5TD SOPN INJECT 0.'75MG'$  INTO THE SKIN ONCE A WEEK 4 mL 0   No facility-administered medications prior to visit.   Past Medical History:  Diagnosis Date   ADD (attention deficit disorder)    ADHD    Allergy    Back pain    "muscle strain from exercising"   Back pain    Borderline diabetes  states "levels have been about 102"   Chest pain    Chronic headaches    Constipation    Fibroid tumor    inside uterus   GERD 06/11/2010   Qualifier: Diagnosis of  By: Dianah Field MD, Marcello Moores     GERD (gastroesophageal reflux disease)    PT STATES "DOES NOT HAVE IT NOW"   H/O hiatal hernia    Hypertension    NO MEDS.. BP CONTROLLED   Hypothyroidism    HYPOTHYROIDISM 04/16/2009   Qualifier: Diagnosis of  By: Dianah Field MD, Thomas     Joint pain    LEG EDEMA, 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   Qualifier: Diagnosis of  By: Dianah Field MD, Thomas     OBESITY 04/16/2009   Other fatigue 08/18/2020   Sedimentation rate  elevation 06/23/2021   SOB (shortness of breath) on exertion    Swelling of both lower extremities    Past Surgical History:  Procedure Laterality Date   ABDOMINAL HYSTERECTOMY     BILATERAL SALPINGECTOMY Bilateral 01/28/2015   Procedure: BILATERAL SALPINGECTOMY;  Surgeon: Eldred Manges, MD;  Location: [facility];  Service: Gynecology;  Laterality: Bilateral;   CARPAL TUNNEL RELEASE Right    DILITATION & CURRETTAGE/HYSTROSCOPY WITH HYDROTHERMAL ABLATION N/A 04/16/2014   Procedure: DILATATION & CURETTAGE/HYSTEROSCOPY WITH HYDROTHERMAL ABLATION;  Surgeon: Frederico Hamman, MD;  Location: [facility];  Service: Gynecology;  Laterality: N/A;   HIATAL HERNIA REPAIR N/A 02/11/2014   Procedure: LAPAROSCOPIC REPAIR OF HIATAL HERNIA;  Surgeon: Pedro Earls, MD;  Location: [facility];  Service: General;  Laterality: N/A;   LAPAROSCOPIC GASTRIC SLEEVE RESECTION N/A 02/11/2014   Procedure: LAPAROSCOPIC GASTRIC SLEEVE RESECTION;  Surgeon: Pedro Earls, MD;  Location: [facility];  Service: General;  Laterality: N/A;   SHOULDER SURGERY     left shoulder loose body removal   SUPRACERVICAL ABDOMINAL HYSTERECTOMY Bilateral 01/28/2015   Procedure: TOTAL SUPER CERVICAL ABDOMINAL HYSTERECTOMY BILATERAL SALPINGECTOMY;  Surgeon: Eldred Manges, MD;  Location: [facility];  Service: Gynecology;  Laterality: Bilateral;   TUBAL LIGATION     UPPER GI ENDOSCOPY  02/11/2014   Procedure: UPPER GI ENDOSCOPY;  Surgeon: Pedro Earls, MD;  Location: [facility];  Service: General;;   No Known Allergies    Objective:    Physical Exam Vitals and nursing note reviewed.  Constitutional:      Appearance: Normal appearance.  Cardiovascular:     Rate and Rhythm: Normal rate and regular rhythm.     Pulses:          Dorsalis pedis pulses are 1+ on the right side and 1+ on the left side.  Pulmonary:     Effort: Pulmonary effort is normal.     Breath sounds: Normal breath sounds.  Musculoskeletal:        General: Normal range of  motion.  Feet:     Right foot:     Protective Sensation: 10 sites tested.  10 sites sensed.     Skin integrity: Skin integrity normal.     Left foot:     Protective Sensation: 10 sites tested.  10 sites sensed.     Skin integrity: Skin integrity normal.  Skin:    General: Skin is warm and dry.  Neurological:     Mental Status: She is alert.  Psychiatric:        Mood and Affect: Mood normal.        Behavior: Behavior normal.    BP (!) 129/90 (BP Location: Left Arm, Patient Position:  Sitting, Cuff Size: Large)   Pulse 92   Temp 97.7 F (36.5 C) (Temporal)   Ht '5\' 8"'$  (1.727 m)   Wt (!) 319 lb 4 oz (144.8 kg)   LMP 04/28/2014   SpO2 97%   BMI 48.54 kg/m  Wt Readings from Last 3 Encounters:  11/07/22 (!) 319 lb 4 oz (144.8 kg)  06/15/22 (!) 332 lb (150.6 kg)  05/02/22 (!) 339 lb (153.8 kg)       Jeanie Sewer, NP

## 2022-12-27 ENCOUNTER — Encounter: Payer: Self-pay | Admitting: Family

## 2022-12-28 ENCOUNTER — Other Ambulatory Visit: Payer: Self-pay

## 2022-12-28 MED ORDER — TIRZEPATIDE 7.5 MG/0.5ML ~~LOC~~ SOAJ: 7.5000 mg | SUBCUTANEOUS | 0 refills | Status: DC

## 2023-01-05 ENCOUNTER — Encounter: Payer: Self-pay | Admitting: Radiology

## 2023-01-05 ENCOUNTER — Ambulatory Visit: Payer: 59 | Admitting: Radiology

## 2023-01-05 VITALS — BP 116/74 | Ht 69.0 in | Wt 296.0 lb

## 2023-01-05 DIAGNOSIS — Z113 Encounter for screening for infections with a predominantly sexual mode of transmission: Secondary | ICD-10-CM | POA: Diagnosis not present

## 2023-01-05 DIAGNOSIS — N760 Acute vaginitis: Secondary | ICD-10-CM | POA: Diagnosis not present

## 2023-01-05 LAB — WET PREP FOR TRICH, YEAST, CLUE

## 2023-01-05 MED ORDER — METRONIDAZOLE 0.75 % VA GEL: 1.0000 | Freq: Every day | VAGINAL | 0 refills | Status: DC

## 2023-01-05 NOTE — Progress Notes (Signed)
      Subjective: Haley Wyatt is a 47 y.o. female who complains of vaginal discharge with odor, concerned she has BV. Also notcied some pink 'spotting' when wiping but recently started juicing beets. S/p supracervical hyst. Has not been sexually active since October 2023. Elects for STI screen.     Review of Systems  All other systems reviewed and are negative.   Past Medical History:  Diagnosis Date   ADD (attention deficit disorder)    ADHD    Allergy    Back pain    "muscle strain from exercising"   Back pain    Borderline diabetes    states "levels have been about 102"   Chest pain    Chronic headaches    Constipation    Fibroid tumor    inside uterus   GERD 06/11/2010   Qualifier: Diagnosis of  By: Dianah Field MD, Marcello Moores     GERD (gastroesophageal reflux disease)    PT STATES "DOES NOT HAVE IT NOW"   H/O hiatal hernia    Hypertension    NO MEDS.. BP CONTROLLED   Hypothyroidism    HYPOTHYROIDISM 04/16/2009   Qualifier: Diagnosis of  By: Dianah Field MD, Thomas     Joint pain    LEG EDEMA, 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   Qualifier: Diagnosis of  By: Dianah Field MD, Thomas     OBESITY 04/16/2009   Other fatigue 08/18/2020   Sedimentation rate elevation 06/23/2021   SOB (shortness of breath) on exertion    Swelling of both lower extremities       Objective:  Today's Vitals   01/05/23 0905  BP: 116/74  Weight: 296 lb (134.3 kg)  Height: 5\' 9"  (1.753 m)   Body mass index is 43.71 kg/m.   -General: no acute distress Physical Exam Vitals and nursing note reviewed. Exam conducted with a chaperone present.  Constitutional:      Appearance: Normal appearance.  Pulmonary:     Effort: Pulmonary effort is normal.  Genitourinary:    General: Normal vulva.     Vagina: Vaginal discharge present. No bleeding.     Cervix: No cervical motion tenderness, discharge, friability or cervical bleeding.     Uterus: Absent.   Neurological:     General: No focal deficit  present.     Mental Status: She is alert and oriented to person, place, and time. Mental status is at baseline.  Psychiatric:        Mood and Affect: Mood normal.        Thought Content: Thought content normal.      Microscopic wet-mount exam shows clue cells.   Chaperone offered and declined.  Assessment:/Plan:   1. Acute vaginitis - WET PREP FOR TRICH, YEAST, CLUE - SureSwab Advanced Vaginitis Plus,TMA + BV rx sent for metrogel  2. Screen for STD (sexually transmitted disease) - SureSwab Advanced Vaginitis Plus,TMA     Will contact patient with results of testing completed today. Avoid intercourse until symptoms are resolved. Safe sex encouraged. Avoid the use of soaps or perfumed products in the peri area. Avoid tub baths and sitting in sweaty or wet clothing for prolonged periods of time.

## 2023-01-07 LAB — SURESWAB® ADVANCED VAGINITIS PLUS,TMA
C. trachomatis RNA, TMA: NOT DETECTED
CANDIDA SPECIES: NOT DETECTED
Candida glabrata: NOT DETECTED
N. gonorrhoeae RNA, TMA: NOT DETECTED
SURESWAB(R) ADV BACTERIAL VAGINOSIS(BV),TMA: POSITIVE — AB
TRICHOMONAS VAGINALIS (TV),TMA: NOT DETECTED

## 2023-01-11 ENCOUNTER — Encounter: Payer: Self-pay | Admitting: Family

## 2023-01-11 ENCOUNTER — Ambulatory Visit (INDEPENDENT_AMBULATORY_CARE_PROVIDER_SITE_OTHER): Payer: 59 | Admitting: Family

## 2023-01-11 VITALS — BP 147/78 | HR 81 | Temp 97.5°F | Ht 69.0 in | Wt 295.0 lb

## 2023-01-11 DIAGNOSIS — B9789 Other viral agents as the cause of diseases classified elsewhere: Secondary | ICD-10-CM

## 2023-01-11 DIAGNOSIS — J329 Chronic sinusitis, unspecified: Secondary | ICD-10-CM

## 2023-01-11 DIAGNOSIS — Z01818 Encounter for other preprocedural examination: Secondary | ICD-10-CM | POA: Diagnosis not present

## 2023-01-11 DIAGNOSIS — I10 Essential (primary) hypertension: Secondary | ICD-10-CM

## 2023-01-11 LAB — CBC WITH DIFFERENTIAL/PLATELET
Basophils Absolute: 0 10*3/uL (ref 0.0–0.1)
Basophils Relative: 0.3 % (ref 0.0–3.0)
Eosinophils Absolute: 0.1 10*3/uL (ref 0.0–0.7)
Eosinophils Relative: 0.9 % (ref 0.0–5.0)
HCT: 42.2 % (ref 36.0–46.0)
Hemoglobin: 13.6 g/dL (ref 12.0–15.0)
Lymphocytes Relative: 36 % (ref 12.0–46.0)
Lymphs Abs: 2.9 10*3/uL (ref 0.7–4.0)
MCHC: 32.3 g/dL (ref 30.0–36.0)
MCV: 84.2 fl (ref 78.0–100.0)
Monocytes Absolute: 0.8 10*3/uL (ref 0.1–1.0)
Monocytes Relative: 10.2 % (ref 3.0–12.0)
Neutro Abs: 4.2 10*3/uL (ref 1.4–7.7)
Neutrophils Relative %: 52.6 % (ref 43.0–77.0)
Platelets: 221 10*3/uL (ref 150.0–400.0)
RBC: 5.01 Mil/uL (ref 3.87–5.11)
RDW: 14.8 % (ref 11.5–15.5)
WBC: 8 10*3/uL (ref 4.0–10.5)

## 2023-01-11 MED ORDER — LOSARTAN POTASSIUM 25 MG PO TABS: 25.0000 mg | ORAL_TABLET | Freq: Every day | ORAL | 1 refills | Status: DC

## 2023-01-11 NOTE — Progress Notes (Signed)
Patient ID: Haley Wyatt, female    DOB: Jan 03, 1976, 46 y.o.   MRN: [id number]  Chief Complaint  Patient presents with   Sinus Problem    sx for 2d   HPI: Sinus sx:  Pt c/o Right ear pain for about 2 days, eyes are getting blurry and facial pressure and headaches. Ear pain better today, had sore throat which is also better. Denies fever.  Presurgery - [place] is going to [place] to have plastic surgery to remove extra skin after having bariatric surgery and having lost weight. Reports just needing a Hgb checked prior to surgery.  Assessment & Plan:  Viral sinusitis - pt feels overall sx are starting to improve, advised on using saline nasal spray tid & prn, advised to try generic Sudafed 1-2x/day for next few days, drink plenty of water. Let me know if sx are not improving.   Preop testing -  pt going to have plastic surgery to remove excess skin in April, just needs Hgb checked, not A1C, pt pulled up instructions and verified.  -     CBC with Differential/Platelet   Subjective:    Outpatient Medications Prior to Visit  Medication Sig Dispense Refill   busPIRone (BUSPAR) 5 MG tablet Take 1-2 tablets (5-10 mg total) by mouth 3 (three) times daily as needed (Take 3 times daily for 1-2 weeks at least, then can back down to twice a day or daily use.). 60 tablet 2   ketoconazole (NIZORAL) 2 % cream Apply 1 application topically daily. 30 g 3   lisdexamfetamine (VYVANSE) 30 MG capsule Take 1 capsule (30 mg total) by mouth daily before breakfast. 30 capsule 0   metFORMIN (GLUCOPHAGE) 1000 MG tablet Take 1 tablet (1,000 mg total) by mouth 2 (two) times daily with a meal. 180 tablet 3   metroNIDAZOLE (METROGEL) 0.75 % vaginal gel Place 1 Applicatorful vaginally at bedtime. 70 g 0   Multiple Vitamins-Minerals (MULTI-VITE) LIQD Take 30 mLs by mouth daily.     tirzepatide (MOUNJARO) 7.5 MG/0.5ML Pen Inject 7.5 mg into the skin once a week. 6 mL 0   lisdexamfetamine (VYVANSE) 30 MG  capsule Take 1 capsule (30 mg total) by mouth daily before breakfast. 30 capsule 0   lisdexamfetamine (VYVANSE) 30 MG capsule Take 1 capsule (30 mg total) by mouth daily before breakfast. 30 capsule 0   No facility-administered medications prior to visit.   Past Medical History:  Diagnosis Date   ADD (attention deficit disorder)    ADHD    Allergy    Back pain    "muscle strain from exercising"   Back pain    Borderline diabetes    states "levels have been about 102"   Chest pain    Chronic headaches    Constipation    Fibroid tumor    inside uterus   GERD 06/11/2010   Qualifier: Diagnosis of  By: Dianah Field MD, Marcello Moores     GERD (gastroesophageal reflux disease)    PT STATES "DOES NOT HAVE IT NOW"   H/O hiatal hernia    Hypertension    NO MEDS.. BP CONTROLLED   Hypothyroidism    HYPOTHYROIDISM 04/16/2009   Qualifier: Diagnosis of  By: Dianah Field MD, Thomas     Joint pain    LEG EDEMA, 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   Qualifier: Diagnosis of  By: Dianah Field MD, Thomas     OBESITY 04/16/2009   Other fatigue 08/18/2020   Sedimentation rate elevation 06/23/2021   SOB (shortness of breath)  on exertion    Swelling of both lower extremities    Past Surgical History:  Procedure Laterality Date   ABDOMINAL HYSTERECTOMY     BILATERAL SALPINGECTOMY Bilateral 01/28/2015   Procedure: BILATERAL SALPINGECTOMY;  Surgeon: Eldred Manges, MD;  Location: [facility];  Service: Gynecology;  Laterality: Bilateral;   CARPAL TUNNEL RELEASE Right    DILITATION & CURRETTAGE/HYSTROSCOPY WITH HYDROTHERMAL ABLATION N/A 04/16/2014   Procedure: DILATATION & CURETTAGE/HYSTEROSCOPY WITH HYDROTHERMAL ABLATION;  Surgeon: Frederico Hamman, MD;  Location: [facility];  Service: Gynecology;  Laterality: N/A;   HIATAL HERNIA REPAIR N/A 02/11/2014   Procedure: LAPAROSCOPIC REPAIR OF HIATAL HERNIA;  Surgeon: Pedro Earls, MD;  Location: [facility];  Service: General;  Laterality: N/A;   LAPAROSCOPIC GASTRIC SLEEVE RESECTION  N/A 02/11/2014   Procedure: LAPAROSCOPIC GASTRIC SLEEVE RESECTION;  Surgeon: Pedro Earls, MD;  Location: [facility];  Service: General;  Laterality: N/A;   SHOULDER SURGERY     left shoulder loose body removal   SUPRACERVICAL ABDOMINAL HYSTERECTOMY Bilateral 01/28/2015   Procedure: TOTAL SUPER CERVICAL ABDOMINAL HYSTERECTOMY BILATERAL SALPINGECTOMY;  Surgeon: Eldred Manges, MD;  Location: [facility];  Service: Gynecology;  Laterality: Bilateral;   TUBAL LIGATION     UPPER GI ENDOSCOPY  02/11/2014   Procedure: UPPER GI ENDOSCOPY;  Surgeon: Pedro Earls, MD;  Location: [facility];  Service: General;;   No Known Allergies    Objective:    Physical Exam Vitals and nursing note reviewed.  Constitutional:      Appearance: Normal appearance. She is not ill-appearing.     Interventions: Face mask in place.  HENT:     Right Ear: Tympanic membrane and ear canal normal.     Left Ear: Tympanic membrane and ear canal normal.     Nose:     Right Sinus: No frontal sinus tenderness.     Left Sinus: No frontal sinus tenderness.     Mouth/Throat:     Mouth: Mucous membranes are moist.     Pharynx: Posterior oropharyngeal erythema present. No pharyngeal swelling, oropharyngeal exudate or uvula swelling.     Tonsils: No tonsillar exudate or tonsillar abscesses.  Cardiovascular:     Rate and Rhythm: Normal rate and regular rhythm.  Pulmonary:     Effort: Pulmonary effort is normal.     Breath sounds: Normal breath sounds.  Musculoskeletal:        General: Normal range of motion.  Lymphadenopathy:     Head:     Right side of head: No preauricular or posterior auricular adenopathy.     Left side of head: No preauricular or posterior auricular adenopathy.     Cervical: No cervical adenopathy.  Skin:    General: Skin is warm and dry.  Neurological:     Mental Status: She is alert.  Psychiatric:        Mood and Affect: Mood normal.        Behavior: Behavior normal.    BP (!) 147/78 (BP Location:  Left Arm, Patient Position: Sitting, Cuff Size: Large)   Pulse 81   Temp (!) 97.5 F (36.4 C) (Temporal)   Ht 5\' 9"  (1.753 m)   Wt 295 lb (133.8 kg)   LMP 04/28/2014   SpO2 100%   BMI 43.56 kg/m  Wt Readings from Last 3 Encounters:  01/11/23 295 lb (133.8 kg)  01/05/23 296 lb (134.3 kg)  11/07/22 (!) 319 lb 4 oz (144.8 kg)     Jeanie Sewer, NP

## 2023-02-03 ENCOUNTER — Encounter: Payer: Self-pay | Admitting: Family

## 2023-02-03 DIAGNOSIS — J0110 Acute frontal sinusitis, unspecified: Secondary | ICD-10-CM

## 2023-02-03 MED ORDER — AMOXICILLIN-POT CLAVULANATE 875-125 MG PO TABS: 1.0000 | ORAL_TABLET | Freq: Two times a day (BID) | ORAL | 0 refills | Status: DC

## 2023-02-15 HISTORY — PX: ABDOMINOPLASTY/PANNICULECTOMY WITH LIPOSUCTION: SHX5577

## 2023-03-06 ENCOUNTER — Emergency Department (HOSPITAL_BASED_OUTPATIENT_CLINIC_OR_DEPARTMENT_OTHER)
Admission: EM | Admit: 2023-03-06 | Discharge: 2023-03-06 | Disposition: A | Discharge status: Home / Self Care | Payer: 59 | Attending: Emergency Medicine | Admitting: Emergency Medicine

## 2023-03-06 ENCOUNTER — Other Ambulatory Visit: Payer: Self-pay

## 2023-03-06 ENCOUNTER — Encounter (HOSPITAL_BASED_OUTPATIENT_CLINIC_OR_DEPARTMENT_OTHER): Payer: Self-pay

## 2023-03-06 ENCOUNTER — Other Ambulatory Visit (HOSPITAL_BASED_OUTPATIENT_CLINIC_OR_DEPARTMENT_OTHER): Payer: Self-pay

## 2023-03-06 DIAGNOSIS — O9089 Other complications of the puerperium, not elsewhere classified: Secondary | ICD-10-CM | POA: Diagnosis not present

## 2023-03-06 DIAGNOSIS — R140 Abdominal distension (gaseous): Secondary | ICD-10-CM | POA: Diagnosis not present

## 2023-03-06 NOTE — ED Provider Notes (Signed)
[place] EMERGENCY DEPARTMENT AT [facility] Provider Note   CSN: [id number] Arrival date & time: 03/06/23  1104     History  Chief Complaint  Patient presents with   Post-op Problem    Haley Wyatt is a 47 y.o. female.  The history is provided by the patient and a relative.   Patient presents for a postoperative complication.  Patient reports she had a tummy tuck surgery in the [place] in late April.  She had no complications.  She has already had a postop visit without issues.  She has had a wound vacuum in place at home.  Since yesterday it has not been draining appropriately.  There has been some drainage.  No fevers or vomiting.  She reports mild abdominal fullness but no severe pain.  No change in her bowel movements.  She is urinating appropriately.  She has already completed a course of antibiotics  Home Medications Prior to Admission medications   Medication Sig Start Date End Date Taking? Authorizing Provider  amoxicillin-clavulanate (AUGMENTIN) 875-125 MG tablet Take 1 tablet by mouth 2 (two) times daily after a meal. 02/03/23   Hudnell, Judeth Cornfield, NP  busPIRone (BUSPAR) 5 MG tablet Take 1-2 tablets (5-10 mg total) by mouth 3 (three) times daily as needed (Take 3 times daily for 1-2 weeks at least, then can back down to twice a day or daily use.). 10/10/22   Dulce Sellar, NP  ketoconazole (NIZORAL) 2 % cream Apply 1 application topically daily. 10/12/21   Janeece Agee, NP  lisdexamfetamine (VYVANSE) 30 MG capsule Take 1 capsule (30 mg total) by mouth daily before breakfast. 11/07/22 12/07/22  Dulce Sellar, NP  lisdexamfetamine (VYVANSE) 30 MG capsule Take 1 capsule (30 mg total) by mouth daily before breakfast. 12/07/22 01/06/23  Dulce Sellar, NP  lisdexamfetamine (VYVANSE) 30 MG capsule Take 1 capsule (30 mg total) by mouth daily before breakfast. 01/06/23 02/05/23  Dulce Sellar, NP  losartan (COZAAR) 25 MG tablet Take 1 tablet  (25 mg total) by mouth daily. 01/11/23   Dulce Sellar, NP  metFORMIN (GLUCOPHAGE) 1000 MG tablet Take 1 tablet (1,000 mg total) by mouth 2 (two) times daily with a meal. 10/10/22   Hudnell, Judeth Cornfield, NP  metroNIDAZOLE (METROGEL) 0.75 % vaginal gel Place 1 Applicatorful vaginally at bedtime. 01/05/23   Chrzanowski, Jami B, NP  Multiple Vitamins-Minerals (MULTI-VITE) LIQD Take 30 mLs by mouth daily.    [provider]  tirzepatide Greggory Keen) 7.5 MG/0.5ML Pen Inject 7.5 mg into the skin once a week. 12/28/22   Dulce Sellar, NP      Allergies    Patient has no known allergies.    Review of Systems   Review of Systems  Physical Exam Updated Vital Signs BP (!) 145/76 (BP Location: Right Arm)   Pulse (!) 103   Temp 97.9 F (36.6 C) (Temporal)   Resp 18   Ht 1.753 m (5\' 9" )   Wt 133.8 kg   LMP 04/28/2014   SpO2 100%   BMI 43.56 kg/m  Physical Exam CONSTITUTIONAL: Well developed/well nourished HEAD: Normocephalic/atraumatic EYES: EOMI ABDOMEN: soft, drainage tube in place.  Mild distention noted.  No focal tenderness. NEURO: Pt is awake/alert/appropriate, moves all extremitiesx4.  No facial droop.   EXTREMITIES:  full ROM SKIN: warm, color normal PSYCH: no abnormalities of mood noted, alert and oriented to situation  ED Results / Procedures / Treatments   Labs (all labs ordered are listed, but only abnormal results are displayed) Labs Reviewed -  No data to display  EKG None  Radiology No results found.  Procedures Procedures    Medications Ordered in ED Medications - No data to display  ED Course/ Medical Decision Making/ A&P                             Medical Decision Making  Patient presents for possible postoperative complication.  She is in no acute distress, afebrile and not vomiting.  She has only mild abdominal fullness. I offered labs and CT imaging, patient reports she feels overall well but just thinks it is a malfunction of the  hardware.  I did advise that we likely do not have the equipment necessary to change it out in the ER.  Patient prefers to be discharged without any further workup and she will attempt to find the suction bag at a medical supply store.  I advise she can return anytime if she has any fevers, chills, abdominal pain or any new complications         Final Clinical Impression(s) / ED Diagnoses Final diagnoses:  Postpartum complication    Rx / DC Orders ED Discharge Orders     None         Zadie Rhine, MD 03/06/23 1139

## 2023-03-06 NOTE — ED Triage Notes (Signed)
Patient here POV from Home.  Endorses having a "tummy tuck" operation in late April. Since yesterday the wound vacuum has not been draining. States she has been having some "leaking" as well. Some Tightness noted. No Known Fevers.   NAD Noted during Triage. A&Ox4. GCS 15. BIB Wheelchair.

## 2023-03-06 NOTE — Discharge Instructions (Signed)
. °  SEEK IMMEDIATE MEDICAL ATTENTION IF:.  °A temperature above 100.4F develops.  °Repeated vomiting occurs (multiple episodes).  °  °Blood is being passed in stools or vomit (bright red or black tarry stools).  °Return also if you develop chest pain, difficulty breathing, dizziness or fainting, or become confused, poorly responsive, or inconsolable. ° °

## 2023-03-06 NOTE — ED Notes (Signed)
Drained 100 ml from wound vac. Denies pain. MD at bedside.

## 2023-03-07 ENCOUNTER — Telehealth: Payer: Self-pay | Admitting: Family

## 2023-03-07 NOTE — Telephone Encounter (Signed)
FYI: This call has been transferred to Access Nurse. Once the result note has been entered staff can address the message at that time.  Patient called in with the following symptoms:  Red Word:Ooze with odor coming from belly button (near surgery site -had surgery February 16, 2023)    Please advise at [facility] [phone]  Message is routed to Provider Pool and [facility] Triage

## 2023-03-08 ENCOUNTER — Encounter (HOSPITAL_COMMUNITY): Payer: Self-pay

## 2023-03-08 ENCOUNTER — Emergency Department (HOSPITAL_COMMUNITY): Payer: 59

## 2023-03-08 ENCOUNTER — Emergency Department (HOSPITAL_COMMUNITY)
Admission: EM | Admit: 2023-03-08 | Discharge: 2023-03-08 | Disposition: A | Discharge status: Home / Self Care | Payer: 59 | Attending: Emergency Medicine | Admitting: Emergency Medicine

## 2023-03-08 ENCOUNTER — Other Ambulatory Visit: Payer: Self-pay

## 2023-03-08 DIAGNOSIS — I10 Essential (primary) hypertension: Secondary | ICD-10-CM | POA: Insufficient documentation

## 2023-03-08 DIAGNOSIS — K5900 Constipation, unspecified: Secondary | ICD-10-CM | POA: Diagnosis not present

## 2023-03-08 DIAGNOSIS — Z87891 Personal history of nicotine dependence: Secondary | ICD-10-CM | POA: Diagnosis not present

## 2023-03-08 DIAGNOSIS — E039 Hypothyroidism, unspecified: Secondary | ICD-10-CM | POA: Insufficient documentation

## 2023-03-08 DIAGNOSIS — R1032 Left lower quadrant pain: Secondary | ICD-10-CM | POA: Diagnosis not present

## 2023-03-08 DIAGNOSIS — Z7984 Long term (current) use of oral hypoglycemic drugs: Secondary | ICD-10-CM | POA: Diagnosis not present

## 2023-03-08 DIAGNOSIS — K5730 Diverticulosis of large intestine without perforation or abscess without bleeding: Secondary | ICD-10-CM | POA: Diagnosis not present

## 2023-03-08 DIAGNOSIS — Z79899 Other long term (current) drug therapy: Secondary | ICD-10-CM | POA: Diagnosis not present

## 2023-03-08 DIAGNOSIS — T859XXA Unspecified complication of internal prosthetic device, implant and graft, initial encounter: Secondary | ICD-10-CM | POA: Insufficient documentation

## 2023-03-08 DIAGNOSIS — T8149XA Infection following a procedure, other surgical site, initial encounter: Secondary | ICD-10-CM | POA: Diagnosis not present

## 2023-03-08 MED ORDER — FLUCONAZOLE 150 MG PO TABS: ORAL_TABLET | ORAL | 0 refills | Status: DC

## 2023-03-08 MED ORDER — CEPHALEXIN 500 MG PO CAPS
500.0000 mg | ORAL_CAPSULE | Freq: Once | ORAL | Status: AC
  Administered 2023-03-08: 500 mg via ORAL
  Filled 2023-03-08: qty 1

## 2023-03-08 MED ORDER — CLINDAMYCIN HCL 300 MG PO CAPS
300.0000 mg | ORAL_CAPSULE | Freq: Once | ORAL | Status: AC
  Administered 2023-03-08: 300 mg via ORAL
  Filled 2023-03-08: qty 1

## 2023-03-08 MED ORDER — CLINDAMYCIN HCL 150 MG PO CAPS: 150.0000 mg | ORAL_CAPSULE | Freq: Four times a day (QID) | ORAL | 0 refills | Status: DC

## 2023-03-08 MED ORDER — CEPHALEXIN 500 MG PO CAPS: 500.0000 mg | ORAL_CAPSULE | Freq: Four times a day (QID) | ORAL | 0 refills | Status: DC

## 2023-03-08 NOTE — ED Notes (Addendum)
Pt's surgical site was irrigated, xeno-form gauze applied to surgical scar, covered in sterile gauze and tape. Wet-dry dressing applied to [employer] and secured with tape by [facility] and myself. Pt was provided education/instruction on dressing changes, frequency of changes and maintenance. Pt verbalized understanding of education and had no further questions or concerns.

## 2023-03-08 NOTE — Telephone Encounter (Signed)
Patient advised see HCP or PCP Triage within 4 Hours or Urgent Care. Patient declined-will call office in the morning.   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 Translation No Nurse Assessment Nurse: Melida Quitter, RN, Victorino Dike Date/Time Lamount Cohen Time): 03/07/2023 5:05:17 PM Confirm and document reason for call. If symptomatic, describe symptoms. ---Caller states that the [place] and is now having oozing and odor coming from her belly button. Has a wound vac. Had loose skin removal on April 26th. No fever. Does the patient have any new or worsening symptoms? ---Yes Will a triage be completed? ---Yes Related visit to physician within the last 2 weeks? ---Yes Does the PT have any chronic conditions? (i.e. diabetes, asthma, this includes High risk factors for pregnancy, etc.) ---No Is the patient pregnant or possibly pregnant? (Ask all females between the ages of 63-55) ---No Is this a behavioral health or substance abuse call? ---No Guidelines Guideline Title Affirmed Question Affirmed Notes Nurse Date/Time Lamount Cohen Time) Post-Op Incision Symptoms and Questions [1] Pus or badsmelling fluid draining from incision AND [2] no fever Theora Master 03/07/2023 5:07:13 PM  Disp. Time Lamount Cohen Time) Disposition Final User 03/07/2023 5:11:23 PM See HCP within 4 Hours (or PCP triage) Yes Theora Master 03/07/2023 5:17:43 PM Send To RN Personal Melida Quitter, RN, Victorino Dike Final Disposition 03/07/2023 5:11:23 PM See HCP within 4 Hours (or PCP triage) Yes Melida Quitter, RN, Lilla Shook Disagree/Comply Comply Caller Understands Yes PreDisposition Did not know what to do Care Advice Given Per Guideline SEE HCP (OR PCP TRIAGE) WITHIN 4 HOURS: * IF OFFICE WILL BE CLOSED AND NO PCP (PRIMARY CARE PROVIDER) SECOND-LEVEL TRIAGE: You need to be seen within the next 3 or 4 hours. A nearby Urgent Care Center [facility]) is often a good source of care. Another choice is to go to the ED. Go sooner if you become worse. CARE ADVICE per Post-Op Incision Symptoms and Questions (Adult) guideline.   Comments User: Hessie Knows, RN Date/Time Lamount Cohen Time): 03/07/2023 5:07:52 PM Wound vac is not where she is oozing at User: Hessie Knows, RN Date/Time Lamount Cohen Time): 03/07/2023 5:30:16 PM Had Lead PC get me Nighttime triage instructions which advised no meds after hours. Called pt back and advised UC in next 3-4 hours. She declines - states she will call office in AM. Referrals GO TO FACILITY UNDECIDED

## 2023-03-08 NOTE — ED Triage Notes (Signed)
Patient reports having tummy tuck at the end of April and now thinks her naval is infected. States there is smell and drainage coming from her naval. Also states her wound vac quit working and she has had to order another one.

## 2023-03-08 NOTE — ED Provider Notes (Signed)
[facility]-EMERGENCY DEPT Provider Note: Lowella Dell, MD, FACEP  CSN: [id number] MRN: [id number] ARRIVAL: 03/08/23 at 0116 ROOM: WA22/WA22   CHIEF COMPLAINT  Post-op Problem   HISTORY OF PRESENT ILLNESS  03/08/23 1:59 AM Haley Wyatt is a 47 y.o. female who had a "tummy tuck" at the end of April in the [place].  38 pounds of skin and adipose tissue were removed and she had a neoumbilicus created.  She has a wound that goes from the right side of her body all the way to the left side of her body.  She has a wound VAC in the right most aspect of the wound.  She completed a postoperative course of clindamycin and cephalexin.  She was seen in the ED 2 days ago because her wound VAC had quit working (the reservoir has a leak and will not hold a vacuum).  She was told that we could not replace her wound VAC and that she would have to obtain one from a medical supply store.   Yesterday, while getting a lymphatic massage, her therapist noticed drainage and a foul odor coming from her neoumbilicus.  She has also had some increase in pain (7 out of 10) at her neoumbilicus as well as in her left lower quadrant.  She is not aware of having a fever.   Past Medical History:  Diagnosis Date   ADD (attention deficit disorder)    ADHD    Allergy    Back pain    "muscle strain from exercising"   Back pain    Borderline diabetes    states "levels have been about 102"   Chest pain    Chronic headaches    Constipation    Fibroid tumor    inside uterus   GERD 06/11/2010   Qualifier: Diagnosis of  By: Benjamin Stain MD, Maisie Fus     GERD (gastroesophageal reflux disease)    PT STATES "DOES NOT HAVE IT NOW"   H/O hiatal hernia    Hypertension    NO MEDS.. BP CONTROLLED   Hypothyroidism    HYPOTHYROIDISM 04/16/2009   Qualifier: Diagnosis of  By: Benjamin Stain MD, Thomas     Joint pain    LEG EDEMA, 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   Qualifier: Diagnosis of  By: Benjamin Stain MD, Thomas     OBESITY  04/16/2009   Other fatigue 08/18/2020   Sedimentation rate elevation 06/23/2021   SOB (shortness of breath) on exertion    Swelling of both lower extremities     Past Surgical History:  Procedure Laterality Date   ABDOMINAL HYSTERECTOMY     BILATERAL SALPINGECTOMY Bilateral 01/28/2015   Procedure: BILATERAL SALPINGECTOMY;  Surgeon: Hal Morales, MD;  Location: [facility];  Service: Gynecology;  Laterality: Bilateral;   CARPAL TUNNEL RELEASE Right    DILITATION & CURRETTAGE/HYSTROSCOPY WITH HYDROTHERMAL ABLATION N/A 04/16/2014   Procedure: DILATATION & CURETTAGE/HYSTEROSCOPY WITH HYDROTHERMAL ABLATION;  Surgeon: Kathreen Cosier, MD;  Location: [facility];  Service: Gynecology;  Laterality: N/A;   HIATAL HERNIA REPAIR N/A 02/11/2014   Procedure: LAPAROSCOPIC REPAIR OF HIATAL HERNIA;  Surgeon: Valarie Merino, MD;  Location: [facility];  Service: General;  Laterality: N/A;   LAPAROSCOPIC GASTRIC SLEEVE RESECTION N/A 02/11/2014   Procedure: LAPAROSCOPIC GASTRIC SLEEVE RESECTION;  Surgeon: Valarie Merino, MD;  Location: [facility];  Service: General;  Laterality: N/A;   SHOULDER SURGERY     left shoulder loose body removal   SUPRACERVICAL ABDOMINAL HYSTERECTOMY Bilateral 01/28/2015   Procedure: TOTAL SUPER  CERVICAL ABDOMINAL HYSTERECTOMY BILATERAL SALPINGECTOMY;  Surgeon: Hal Morales, MD;  Location: [facility];  Service: Gynecology;  Laterality: Bilateral;   TUBAL LIGATION     UPPER GI ENDOSCOPY  02/11/2014   Procedure: UPPER GI ENDOSCOPY;  Surgeon: Valarie Merino, MD;  Location: [facility];  Service: General;;    Family History  Problem Relation Age of Onset   Diverticulitis Mother    Alcoholism Mother    Hyperlipidemia Father    Hypertension Father    Diabetes Father    Schizophrenia Father    Depression Father    Mental illness Sister    Drug abuse Sister    Colon cancer Maternal Grandmother    Diabetes Paternal Grandmother    Sudden death Neg Hx    Heart attack Neg Hx     Social History    Tobacco Use   Smoking status: Former    Types: Cigarettes    Quit date: 02/05/2006    Years since quitting: 17.0   Smokeless tobacco: Never  Vaping Use   Vaping Use: Never used  Substance Use Topics   Alcohol use: Not Currently    Comment: Occasionally   Drug use: No    Prior to Admission medications   Medication Sig Start Date End Date Taking? Authorizing Provider  cephALEXin (KEFLEX) 500 MG capsule Take 1 capsule (500 mg total) by mouth 4 (four) times daily. 03/08/23  Yes Lashante Fryberger, MD  clindamycin (CLEOCIN) 150 MG capsule Take 1 capsule (150 mg total) by mouth every 6 (six) hours. 03/08/23  Yes Drayden Lukas, MD  fluconazole (DIFLUCAN) 150 MG tablet Take 1 tablet as needed for vaginal yeast infection.  May repeat 3 days if symptoms persist. 03/08/23  Yes Lateesha Bezold, Jonny Ruiz, MD  busPIRone (BUSPAR) 5 MG tablet Take 1-2 tablets (5-10 mg total) by mouth 3 (three) times daily as needed (Take 3 times daily for 1-2 weeks at least, then can back down to twice a day or daily use.). 10/10/22   Dulce Sellar, NP  ketoconazole (NIZORAL) 2 % cream Apply 1 application topically daily. 10/12/21   Janeece Agee, NP  lisdexamfetamine (VYVANSE) 30 MG capsule Take 1 capsule (30 mg total) by mouth daily before breakfast. 11/07/22 12/07/22  Dulce Sellar, NP  lisdexamfetamine (VYVANSE) 30 MG capsule Take 1 capsule (30 mg total) by mouth daily before breakfast. 12/07/22 01/06/23  Dulce Sellar, NP  lisdexamfetamine (VYVANSE) 30 MG capsule Take 1 capsule (30 mg total) by mouth daily before breakfast. 01/06/23 02/05/23  Dulce Sellar, NP  losartan (COZAAR) 25 MG tablet Take 1 tablet (25 mg total) by mouth daily. 01/11/23   Dulce Sellar, NP  metFORMIN (GLUCOPHAGE) 1000 MG tablet Take 1 tablet (1,000 mg total) by mouth 2 (two) times daily with a meal. 10/10/22   Hudnell, Judeth Cornfield, NP  metroNIDAZOLE (METROGEL) 0.75 % vaginal gel Place 1 Applicatorful vaginally at bedtime. 01/05/23   Chrzanowski,  Jami B, NP  Multiple Vitamins-Minerals (MULTI-VITE) LIQD Take 30 mLs by mouth daily.    [provider]  tirzepatide Greggory Keen) 7.5 MG/0.5ML Pen Inject 7.5 mg into the skin once a week. 12/28/22   Dulce Sellar, NP    Allergies Patient has no known allergies.   REVIEW OF SYSTEMS  Negative except as noted here or in the History of Present Illness.   PHYSICAL EXAMINATION  Initial Vital Signs Blood pressure (!) 141/101, pulse (!) 126, temperature 98.6 F (37 C), temperature source Oral, resp. rate 17, height 5\' 9"  (1.753 m), weight 127 kg,  last menstrual period 04/28/2014, SpO2 95 %.  Examination General: Well-developed, high BMI female in no acute distress; appearance consistent with age of record HENT: normocephalic; atraumatic Eyes: Normal appearance Neck: supple Heart: regular rate and rhythm; tachycardia noted in triage no longer present Lungs: clear to auscultation bilaterally Abdomen: Neoumbilicus with apparent dehiscence of sutures and foul draining, a small amount of surrounding erythema, well-healing low abdominal incision with vacuum drain at right side of wound:      Extremities: No deformity; full range of motion Neurologic: Awake, alert and oriented; motor function intact in all extremities and symmetric; no facial droop Skin: Warm and dry Psychiatric: Normal mood and affect   RESULTS  Summary of this visit's results, reviewed and interpreted by myself:   EKG Interpretation  Date/Time:    Ventricular Rate:    PR Interval:    QRS Duration:   QT Interval:    QTC Calculation:   R Axis:     Text Interpretation:         Laboratory Studies: No results found for this or any previous visit (from the past 24 hour(s)). Imaging Studies: CT ABDOMEN PELVIS WO CONTRAST  Result Date: 03/08/2023 CLINICAL DATA:  Recent abdominoplasty (tummy tuck) procedure with suspected wound infection. 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 EXAM: CT  ABDOMEN AND PELVIS WITHOUT CONTRAST TECHNIQUE: Multidetector CT imaging of the abdomen and pelvis was performed following the standard protocol without IV contrast. RADIATION DOSE REDUCTION: This exam was performed according to the departmental dose-optimization program which includes automated exposure control, adjustment of the mA and/or kV according to patient size and/or use of iterative reconstruction technique. COMPARISON:  CT with IV contrast 01/16/2020 FINDINGS: Lower chest: There is a complex subcutaneous fluid collection laterally in the lower right chest wall, measuring 8 x 3.3 x 7.7 cm and 19 Hounsfield units. This appears to at least partially communicate with a smaller elliptical collection just deep and inferomedial to it. There is a similar complex fluid collection in the lateral lower left chest wall at this same level measuring 4.6 x 3.2 x 4.1 cm, also measuring 19 Hounsfield units. There is linear atelectasis in the right lower lung field alongside the asymmetrically elevated right hemidiaphragm, which was seen previously. Lung bases are clear of infiltrates. The cardiac size is normal. There is a small hiatal hernia and evidence of prior hiatal hernia repair. There is no pericardial effusion. Hepatobiliary: Enlarged, measuring 25 cm length, with mild steatosis. Noncontrast attenuation is homogeneous. No masses seen without contrast. The gallbladder and bile ducts are unremarkable. Pancreas: Unremarkable without contrast. Spleen: Unremarkable without contrast. Adrenals/Urinary Tract: Adrenal glands are unremarkable. The unenhanced kidneys are homogeneous, without renal calculi, visible focal lesion, or hydronephrosis. Bladder is unremarkable for the degree of distention. Stomach/Bowel: Sleeve gastrectomy changes are again shown. There is no GI tract dilatation or wall thickening including the appendix. There are few tiny scattered stones in the appendix. There is diverticulosis without evidence of  diverticulitis. Mild fecal stasis. Vascular/Lymphatic: No significant vascular findings are present. No enlarged abdominal or pelvic lymph nodes. Reproductive: Prior supracervical hysterectomy.  No adnexal mass. Other: There are postsurgical changes compatible with recent history of an abdominoplasty. Postsurgical midline fascial infolding is noted in between the rectus abdominis muscles. There is a moderately dense collection within the infolding measuring 8.5 cm length and 2.1 x 1.8 cm AP and transverse consistent with a hematoma. There is a surgical drain extending horizontally across the abdomen in the subcutaneous plane crossing just beneath  what is believed to be the neoumbilicus, alternatively could be a small midline open wound. There is diffuse edema throughout the abdominal wall, with subcutaneous stranding and scattered deep subcutaneous air pockets. Findings could be simply due to the recent surgery or could indicate infectious complication such as cellulitis. No abdominal wall localized fluid collections are seen. Edema continues into the anterior upper thighs. Within the abdomen and pelvis proper, there is no free air, free hemorrhage or ascites and no acute inflammatory change. There are no incarcerated hernias. Musculoskeletal: Partial hemangiomatous replacement of the L3 vertebral body appears similar. No concerning regional bone lesions. Disproportionate facet hypertrophy at L4-5 associated with grade 1 L4-5 anterolisthesis. IMPRESSION: 1. Postsurgical changes compatible with recent abdominoplasty. 2. Diffuse edema throughout the abdominal wall with subcutaneous stranding and scattered deep subcutaneous air pockets. Findings could be simply due to the recent surgery or could indicate infectious complication such as cellulitis. 3. 8.5 x 2.1 x 1.8 cm hematoma in the midline abdominal wall infolding between the rectus abdominis muscles. 4. Complex fluid collections in the lateral lower chest wall  bilaterally, 8 x 3.3 x 7.7 cm on the right and 4.6 x 3.2 x 4.1 cm on the left. These could be hematomas, seromas, or abscesses. 5. Constipation and diverticulosis. 6. Hepatomegaly with mild steatosis. 7. Hiatal hernia with evidence of prior hiatal hernia repair. 8. Disproportionate facet hypertrophy at L4-5 with grade 1 L4-5 anterolisthesis. Electronically Signed   By: Almira Bar M.D.   On: 03/08/2023 03:34    ED COURSE and MDM  Nursing notes, initial and subsequent vitals signs, including pulse oximetry, reviewed and interpreted by myself.  Vitals:   03/08/23 0130 03/08/23 0132 03/08/23 0319  BP: (!) 141/101  127/82  Pulse: (!) 126  (!) 107  Resp: 17  18  Temp: 98.6 F (37 C)    TempSrc: Oral    SpO2: 95%  94%  Weight:  127 kg   Height:  5\' 9"  (1.753 m)    Medications  clindamycin (CLEOCIN) capsule 300 mg (has no administration in time range)  cephALEXin (KEFLEX) capsule 500 mg (has no administration in time range)    3:55 AM Wound VAC replaced by nursing staff.  It is now maintaining suction and draining serous fluid well.  We will teach the patient to perform wet-to-dry dressing changes to her neoumbilicus.  We will restart her on Keflex and clindamycin.  She has a follow-up with a local nurse practitioner (who manages her postoperative care locally) this coming Monday (today is Wednesday).  Physical examination does not suggest the fluid collections in the lower chest wall are abscesses.   PROCEDURES  Procedures   ED DIAGNOSES     ICD-10-CM   1. Postoperative wound infection  T81.49XA     2. Complication of device, initial encounter  T85.9XXA          Lakasha Mcfall, Jonny Ruiz, MD 03/08/23 [phone]

## 2023-03-11 LAB — AEROBIC/ANAEROBIC CULTURE W GRAM STAIN (SURGICAL/DEEP WOUND)

## 2023-03-11 NOTE — ED Notes (Addendum)
Dr. Carlisle Beers. [place] called this Consulting civil engineer at 10 am on 03/11/23. He gave a verbal order to call the patients pharmacy and call in ciprofloxacin 500mg  oral 2x a day for 10 days, due to patients wound culture being positive for pseudomonas. Dr. Read Drivers provided me with his DEA and that was given to the pharmacist. Patient was called and voicemail left regarding the change in medication. Patient was instructed to stop taking the antibiotics she was originally prescribed and to start taking ciprofloxacin to treat her infection.   Patient answered phone call at 11:08am. Patient understands to stop taking her other prescribed antibiotics and start the new ciprofloxacin twice a day for 10 days. Patient said "I feel a whole lot better, like a new person." Patient is picking up the medication at [facility] on [address].

## 2023-03-12 ENCOUNTER — Telehealth (HOSPITAL_BASED_OUTPATIENT_CLINIC_OR_DEPARTMENT_OTHER): Payer: Self-pay | Admitting: *Deleted

## 2023-03-12 NOTE — Telephone Encounter (Signed)
Post ED Visit - Positive Culture Follow-up  Culture report reviewed by antimicrobial stewardship pharmacist: [facility] []  Enzo Bi, Pharm.D. []  Celedonio Miyamoto, Pharm.D., BCPS AQ-ID []  Garvin Fila, Pharm.D., BCPS []  Georgina Pillion, Pharm.D., BCPS []  [place], [address].D., BCPS, AAHIVP []  Estella Husk, Pharm.D., BCPS, AAHIVP []  Lysle Pearl, PharmD, BCPS []  Phillips Climes, PharmD, BCPS []  Agapito Games, PharmD, BCPS []  Verlan Friends, PharmD []  Mervyn Gay, PharmD, BCPS []  Vinnie Level, PharmD  [facility] []  Len Childs, PharmD []  Greer Pickerel, PharmD []  Adalberto Cole, PharmD []  Perlie Gold, Rph []  Lonell Face) Jean Rosenthal, PharmD [x]  Earl Many, PharmD []  Junita Push, PharmD []  Dorna Leitz, PharmD []  Terrilee Files, PharmD []  Lynann Beaver, PharmD []  Keturah Barre, PharmD []  Loralee Pacas, PharmD []  Bernadene Person, PharmD   Positive aerobic culture See previous note. Cipro was called in by ED nurse yesterday Patsey Berthold 03/12/2023, 4:18 PM

## 2023-03-15 NOTE — ED Provider Notes (Signed)
Follow-up note.  On Saturday, 03/11/2023, I received the results of the patient's wound culture.  It grew out Pseudomonas aeruginosa sensitive to tested antibiotics.  The patient had been placed on clindamycin and cephalexin, neither of which are likely to treat Pseudomonas.  Sensitivity showed that this strain was sensitive to ciprofloxacin so I contacted Sheria Lang, the charge nurse at [facility], to call in a prescription for ciprofloxacin 500 mg twice daily for 10 days and to advise the patient of the change in antibiotics and the need to return if the infection appears to be worsening.     Haley Wyatt, Haley Ruiz, MD 03/15/23 [phone]

## 2023-03-21 ENCOUNTER — Telehealth: Payer: Self-pay | Admitting: Family

## 2023-03-21 ENCOUNTER — Other Ambulatory Visit: Payer: Self-pay | Admitting: Physician Assistant

## 2023-03-21 NOTE — Telephone Encounter (Signed)
Prescription Request  03/21/2023  LOV: 01/11/2023  What is the name of the medication or equipment? Vyvanse 30 mg   Have you contacted your pharmacy to request a refill? Yes   Which pharmacy would you like this sent to?  [facility] - [place], [place] - [address] [place] [address] Phone: [phone] Fax: [phone]    Patient notified that their request is being sent to the clinical staff for review and that they should receive a response within 2 business days.   Please advise at Mobile [phone] (mobile)

## 2023-03-22 ENCOUNTER — Other Ambulatory Visit: Payer: Self-pay | Admitting: Physician Assistant

## 2023-03-30 ENCOUNTER — Encounter: Payer: Self-pay | Admitting: Family

## 2023-03-30 ENCOUNTER — Ambulatory Visit: Payer: 59 | Admitting: Family

## 2023-03-30 VITALS — BP 135/87 | HR 95 | Temp 98.0°F | Ht 69.0 in | Wt 273.0 lb

## 2023-03-30 DIAGNOSIS — E1169 Type 2 diabetes mellitus with other specified complication: Secondary | ICD-10-CM

## 2023-03-30 DIAGNOSIS — L929 Granulomatous disorder of the skin and subcutaneous tissue, unspecified: Secondary | ICD-10-CM

## 2023-03-30 DIAGNOSIS — F988 Other specified behavioral and emotional disorders with onset usually occurring in childhood and adolescence: Secondary | ICD-10-CM | POA: Diagnosis not present

## 2023-03-30 DIAGNOSIS — Z6841 Body Mass Index (BMI) 40.0 and over, adult: Secondary | ICD-10-CM

## 2023-03-30 DIAGNOSIS — Z7984 Long term (current) use of oral hypoglycemic drugs: Secondary | ICD-10-CM

## 2023-03-30 DIAGNOSIS — Z7985 Long-term (current) use of injectable non-insulin antidiabetic drugs: Secondary | ICD-10-CM

## 2023-03-30 DIAGNOSIS — F418 Other specified anxiety disorders: Secondary | ICD-10-CM

## 2023-03-30 MED ORDER — LISDEXAMFETAMINE DIMESYLATE 30 MG PO CAPS
30.0000 mg | ORAL_CAPSULE | Freq: Every day | ORAL | 0 refills | Status: DC
Start: 2023-03-30 — End: 2023-07-14

## 2023-03-30 MED ORDER — LISDEXAMFETAMINE DIMESYLATE 30 MG PO CAPS
30.0000 mg | ORAL_CAPSULE | Freq: Every day | ORAL | 0 refills | Status: DC
Start: 2023-05-30 — End: 2023-07-14

## 2023-03-30 MED ORDER — LISDEXAMFETAMINE DIMESYLATE 30 MG PO CAPS
30.0000 mg | ORAL_CAPSULE | Freq: Every day | ORAL | 0 refills | Status: DC
Start: 2023-04-29 — End: 2023-07-14

## 2023-03-30 MED ORDER — BUSPIRONE HCL 5 MG PO TABS
5.0000 mg | ORAL_TABLET | Freq: Three times a day (TID) | ORAL | 5 refills | Status: DC | PRN
Start: 2023-03-30 — End: 2023-11-06

## 2023-03-30 MED ORDER — METFORMIN HCL 1000 MG PO TABS: 500.0000 mg | ORAL_TABLET | Freq: Every day | ORAL | 1 refills | Status: DC

## 2023-03-30 NOTE — Assessment & Plan Note (Signed)
chronic last A1C here was 6.3, pt taking 1g Metformin bid, most recent A1C (in DM) was <5.0 advised pt to reduce her Metformin to 500mg  qam can't find Mounjaro in stock, was paying >$400 per month f/u in 3 mos, will recheck labs

## 2023-03-30 NOTE — Assessment & Plan Note (Addendum)
Chronic taking Vyvanse 30 mg, states she is doing better back on this & off adderall, does not want to increase the dose Sending refills Follow-up in 3 months

## 2023-03-30 NOTE — Assessment & Plan Note (Signed)
chronic pt taking Buspar 5-10mg  bid prn sending refill f/u 68m-1 yr

## 2023-03-30 NOTE — Progress Notes (Signed)
Patient ID: Haley Wyatt, female    DOB: [birth year], 47 y.o.   MRN: [id number]  Chief Complaint  Patient presents with   ADHD   Suture / Staple Removal    Navel stitches removal. Pt had    Anxiety   Diabetes    HPI: Anxiety:  sx started last summer (2023) due to increase stress at work, manifested into chest pain and jaw pain. Started on Buspar and pt takes 5-10mg  bid prn. Reports this has helped, no changes in dose needed. T2DM: Pt is currently maintained on the following medications for diabetes: Metformin, Mounjaro Failed meds include: none Denies polyuria/polydipsia/polyphagia Denies hypoglycemia Home glucose readings range: Not currently checking; recently had surgery in [place] and told her A1C was <5.0. Has not taken [facility] for a few months, not able to find in stock. ADHD f/u: Medications helping target goals: switched to Vyvanse last vist, 30mg  due to irritability, sleeplessness Regimen: daily Medication side effects/concerns: pt reports less irritability and sleeplessness back on Vyvanse, does not take med on weekends Weight: No change Sleep: Sometimes Mood changes: none Tics: Denies Blood pressure, Weight, Pulse, Behavior reviewed: wnl  Suture removal:  [place] of [place], she has been going to a clinic in town that does post op care (lymphatic massage) but advised her to go to the ER as she developed an infection with one incision in her umbilicus, seen by ER on 5/15 and given abt to treat, but did not remove sutures, per pt,  they wanted to wait until infection resolved before removing sutures, so she is here today to see if I can remove the sutures.  Assessment & Plan:  Attention deficit disorder, unspecified hyperactivity presence Assessment & Plan: Chronic taking Vyvanse 30 mg, states she is doing better back on this & off adderall, does not want to increase the dose Sending refills Follow-up in 3 months  Orders: -      Lisdexamfetamine Dimesylate; Take 1 capsule (30 mg total) by mouth daily before breakfast.  Dispense: 30 capsule; Refill: 0 -     Lisdexamfetamine Dimesylate; Take 1 capsule (30 mg total) by mouth daily before breakfast.  Dispense: 30 capsule; Refill: 0 -     Lisdexamfetamine Dimesylate; Take 1 capsule (30 mg total) by mouth daily before breakfast.  Dispense: 30 capsule; Refill: 0  Situational anxiety Assessment & Plan: chronic pt taking Buspar 5-10mg  bid prn sending refill f/u 53m-1 yr  Orders: -     busPIRone HCl; Take 1-2 tablets (5-10 mg total) by mouth 3 (three) times daily as needed (Take 3 times daily for 1-2 weeks at least, then can back down to twice a day or daily use.).  Dispense: 60 tablet; Refill: 5  Hypergranulation -  umbilicus - only 2 sutures visible, however, are embedded in hypergranulated tissue most likely formed from performing wet to dry dressings for too long on her draining infection. Advised she will need to go to a wound center to get the tissue burned off to then be able to remove the sutures.   Type 2 diabetes mellitus with morbid obesity (HCC) Assessment & Plan: chronic last A1C here was 6.3, pt taking 1g Metformin bid, most recent A1C (in [place]) was <5.0 advised pt to reduce her Metformin to 500mg  qam can't find Mounjaro in stock, was paying >$400 per month f/u in 3 mos, will recheck labs  Orders: -     metFORMIN HCl; Take 0.5 tablets (500  mg total) by mouth daily with breakfast.  Dispense: 90 tablet; Refill: 1   Subjective:    Outpatient Medications Prior to Visit  Medication Sig Dispense Refill   ketoconazole (NIZORAL) 2 % cream Apply 1 application topically daily. 30 g 3   losartan (COZAAR) 25 MG tablet Take 1 tablet (25 mg total) by mouth daily. 90 tablet 1   metroNIDAZOLE (METROGEL) 0.75 % vaginal gel Place 1 Applicatorful vaginally at bedtime. 70 g 0   Multiple Vitamins-Minerals (MULTI-VITE) LIQD Take 30 mLs by mouth daily.     tirzepatide  (MOUNJARO) 7.5 MG/0.5ML Pen Inject 7.5 mg into the skin once a week. 6 mL 0   busPIRone (BUSPAR) 5 MG tablet Take 1-2 tablets (5-10 mg total) by mouth 3 (three) times daily as needed (Take 3 times daily for 1-2 weeks at least, then can back down to twice a day or daily use.). 60 tablet 2   metFORMIN (GLUCOPHAGE) 1000 MG tablet Take 1 tablet (1,000 mg total) by mouth 2 (two) times daily with a meal. 180 tablet 3   cephALEXin (KEFLEX) 500 MG capsule Take 1 capsule (500 mg total) by mouth 4 (four) times daily. (Patient not taking: Reported on 03/30/2023) 28 capsule 0   clindamycin (CLEOCIN) 150 MG capsule Take 1 capsule (150 mg total) by mouth every 6 (six) hours. (Patient not taking: Reported on 03/30/2023) 28 capsule 0   fluconazole (DIFLUCAN) 150 MG tablet Take 1 tablet as needed for vaginal yeast infection.  May repeat 3 days if symptoms persist. (Patient not taking: Reported on 03/30/2023) 2 tablet 0   lisdexamfetamine (VYVANSE) 30 MG capsule Take 1 capsule (30 mg total) by mouth daily before breakfast. 30 capsule 0   lisdexamfetamine (VYVANSE) 30 MG capsule Take 1 capsule (30 mg total) by mouth daily before breakfast. 30 capsule 0   lisdexamfetamine (VYVANSE) 30 MG capsule Take 1 capsule (30 mg total) by mouth daily before breakfast. 30 capsule 0   No facility-administered medications prior to visit.   Past Medical History:  Diagnosis Date   ADD (attention deficit disorder)    ADHD    Allergy    Back pain    "muscle strain from exercising"   Back pain    Borderline diabetes    states "levels have been about 102"   Chest pain    Chronic headaches    Constipation    Corneal ulceration 06/23/2021   Dysmenorrhea 01/28/2015   Fibroid tumor    inside uterus   GERD 06/11/2010   Qualifier: Diagnosis of  By: Benjamin Stain MD, Maisie Fus     GERD (gastroesophageal reflux disease)    PT STATES "DOES NOT HAVE IT NOW"   H/O hiatal hernia    Heel pain, bilateral 06/23/2021   Hypertension    NO MEDS..  BP CONTROLLED   Hypothyroidism    HYPOTHYROIDISM 04/16/2009   Qualifier: Diagnosis of  By: Benjamin Stain MD, Thomas     Joint pain    LEG EDEMA, 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   Qualifier: Diagnosis of  By: Benjamin Stain MD, Thomas     OBESITY 04/16/2009   Other fatigue 08/18/2020   Other specified hypothyroidism 08/18/2020   Sedimentation rate elevation 06/23/2021   SOB (shortness of breath) on exertion    Swelling of both lower extremities    Urge incontinence 05/02/2022   Past Surgical History:  Procedure Laterality Date   ABDOMINAL HYSTERECTOMY     ABDOMINOPLASTY/PANNICULECTOMY WITH LIPOSUCTION  02/15/2023   surgery in [place]   BILATERAL SALPINGECTOMY  Bilateral 01/28/2015   Procedure: BILATERAL SALPINGECTOMY;  Surgeon: Hal Morales, MD;  Location: [facility];  Service: Gynecology;  Laterality: Bilateral;   CARPAL TUNNEL RELEASE Right    DILITATION & CURRETTAGE/HYSTROSCOPY WITH HYDROTHERMAL ABLATION N/A 04/16/2014   Procedure: DILATATION & CURETTAGE/HYSTEROSCOPY WITH HYDROTHERMAL ABLATION;  Surgeon: Kathreen Cosier, MD;  Location: [facility];  Service: Gynecology;  Laterality: N/A;   HIATAL HERNIA REPAIR N/A 02/11/2014   Procedure: LAPAROSCOPIC REPAIR OF HIATAL HERNIA;  Surgeon: Valarie Merino, MD;  Location: [facility];  Service: General;  Laterality: N/A;   LAPAROSCOPIC GASTRIC SLEEVE RESECTION N/A 02/11/2014   Procedure: LAPAROSCOPIC GASTRIC SLEEVE RESECTION;  Surgeon: Valarie Merino, MD;  Location: [facility];  Service: General;  Laterality: N/A;   SHOULDER SURGERY     left shoulder loose body removal   SUPRACERVICAL ABDOMINAL HYSTERECTOMY Bilateral 01/28/2015   Procedure: TOTAL SUPER CERVICAL ABDOMINAL HYSTERECTOMY BILATERAL SALPINGECTOMY;  Surgeon: Hal Morales, MD;  Location: [facility];  Service: Gynecology;  Laterality: Bilateral;   TUBAL LIGATION     UPPER GI ENDOSCOPY  02/11/2014   Procedure: UPPER GI ENDOSCOPY;  Surgeon: Valarie Merino, MD;  Location: [facility];   Service: General;;   No Known Allergies    Objective:    Physical Exam Vitals and nursing note reviewed.  Constitutional:      Appearance: Normal appearance. She is obese.  Cardiovascular:     Rate and Rhythm: Normal rate and regular rhythm.  Pulmonary:     Effort: Pulmonary effort is normal.     Breath sounds: Normal breath sounds.  Abdominal:     General: Abdomen is protuberant. A surgical scar is present.     Comments: approx. 0.3cm in diameter of hypergranulating tissue noted in umbilicus. Able to visualize 3 black end pieces of sutures, however the tied ends are embedded in the tissue and unable to be cut.  Musculoskeletal:        General: Normal range of motion.  Skin:    General: Skin is warm and dry.  Neurological:     Mental Status: She is alert.  Psychiatric:        Mood and Affect: Mood normal.        Behavior: Behavior normal.    BP 135/87   Pulse 95   Temp 98 F (36.7 C) (Temporal)   Ht 5\' 9"  (1.753 m)   Wt 273 lb (123.8 kg)   LMP 04/28/2014   SpO2 97%   BMI 40.32 kg/m  Wt Readings from Last 3 Encounters:  03/30/23 273 lb (123.8 kg)  03/08/23 280 lb (127 kg)  03/06/23 294 lb 15.6 oz (133.8 kg)       Dulce Sellar, NP

## 2023-03-31 ENCOUNTER — Encounter: Payer: Self-pay | Admitting: Family

## 2023-03-31 DIAGNOSIS — L929 Granulomatous disorder of the skin and subcutaneous tissue, unspecified: Secondary | ICD-10-CM

## 2023-04-03 NOTE — Telephone Encounter (Signed)
Hasna, Can you please call the wound center & ask for a nurse & verify they will not see her - I'm not sure they understand the stitches are embedded due to the extra tissue (hypergranulation) that formed in her umbilicus and it is not a simple stitch removal.

## 2023-04-04 NOTE — Telephone Encounter (Signed)
Wound care center stated pt didn't call back to schedule so appointment has been taken. She stated she will talk to a clinical nurse to see if they can fit her in, She should be giving me a call back in regards.

## 2023-04-04 NOTE — Telephone Encounter (Signed)
great, thx! can you let Martine know also please.

## 2023-04-12 ENCOUNTER — Encounter (HOSPITAL_BASED_OUTPATIENT_CLINIC_OR_DEPARTMENT_OTHER): Payer: 59 | Attending: Physician Assistant | Admitting: Physician Assistant

## 2023-04-12 DIAGNOSIS — E039 Hypothyroidism, unspecified: Secondary | ICD-10-CM | POA: Insufficient documentation

## 2023-04-12 DIAGNOSIS — T8131XA Disruption of external operation (surgical) wound, not elsewhere classified, initial encounter: Secondary | ICD-10-CM | POA: Insufficient documentation

## 2023-04-12 DIAGNOSIS — Z87891 Personal history of nicotine dependence: Secondary | ICD-10-CM | POA: Diagnosis not present

## 2023-04-12 DIAGNOSIS — X58XXXA Exposure to other specified factors, initial encounter: Secondary | ICD-10-CM | POA: Diagnosis not present

## 2023-04-12 DIAGNOSIS — K219 Gastro-esophageal reflux disease without esophagitis: Secondary | ICD-10-CM | POA: Insufficient documentation

## 2023-04-12 DIAGNOSIS — I10 Essential (primary) hypertension: Secondary | ICD-10-CM | POA: Diagnosis not present

## 2023-04-12 DIAGNOSIS — F909 Attention-deficit hyperactivity disorder, unspecified type: Secondary | ICD-10-CM | POA: Diagnosis not present

## 2023-04-12 DIAGNOSIS — L98492 Non-pressure chronic ulcer of skin of other sites with fat layer exposed: Secondary | ICD-10-CM | POA: Insufficient documentation

## 2023-04-13 NOTE — Progress Notes (Signed)
[place], Ayana ([id number]) [id number] Page 1 of 7 Visit Report for 04/12/2023 Allergy List Details Patient Name: Date of Service: Niel Hummer 04/12/2023 12:30 PM Medical Record Number: [id number] Patient Account Number: [id number] Date of Birth/Sex: Treating RN: 1976-01-09 (47 y.o. Arta Silence Primary Care Anistyn Graddy: Dulce Sellar Other Clinician: Referring Claiborne Stroble: Treating Lennix Rotundo/Extender: Riki Rusk Weeks in Treatment: 0 Allergies Active Allergies No Known Drug Allergies Allergy Notes Electronic Signature(s) Signed: 04/12/2023 3:27:24 PM By: Shawn Stall RN, BSN Entered By: Shawn Stall on 04/11/2023 12:23:52 -------------------------------------------------------------------------------- Arrival Information Details Patient Name: Date of Service: [name], [name] 04/12/2023 12:30 PM Medical Record Number: [id number] Patient Account Number: [id number] Date of Birth/Sex: Treating RN: 02-18-1976 (46 y.o. Katrinka Blazing Primary Care Kimberley Speece: Dulce Sellar Other Clinician: Referring Shiryl Ruddy: Treating Neya Creegan/Extender: Milbert Coulter in Treatment: 0 Visit Information Patient Arrived: Ambulatory Arrival Time: 12:38 Accompanied By: self Transfer Assistance: None Patient Identification Verified: Yes Electronic Signature(s) Signed: 04/12/2023 5:34:23 PM By: Karie Schwalbe RN Entered By: Karie Schwalbe on 04/12/2023 12:45:20 -------------------------------------------------------------------------------- Clinic Level of Care Assessment Details Patient Name: Date of Service: Niel Hummer 04/12/2023 12:30 PM Medical Record Number: [id number] Patient Account Number: [id number] Date of Birth/Sex: Treating RN: [birth year] (47 y.o. Katrinka Blazing Primary Care Mohammad Granade: Dulce Sellar Other Clinician: Referring Jamilett Ferrante: Treating Christle Nolting/Extender: Riki Rusk Joshua, Quetzali ([id number]) [id number] Page 2 of 7 Weeks in Treatment: 0 Clinic Level of Care Assessment Items TOOL 1 Quantity Score X- 1 0 Use when EandM and Procedure is performed on INITIAL visit ASSESSMENTS - Nursing Assessment / Reassessment X- 1 20 General Physical Exam (combine w/ comprehensive assessment (listed just below) when performed on new pt. evals) X- 1 25 Comprehensive Assessment (HX, ROS, Risk Assessments, Wounds Hx, etc.) ASSESSMENTS - Wound and Skin Assessment / Reassessment X- 1 10 Dermatologic / Skin Assessment (not related to wound area) ASSESSMENTS - Ostomy and/or Continence Assessment and Care []  - 0 Incontinence Assessment and Management []  - 0 Ostomy Care Assessment and Management (repouching, etc.) PROCESS - Coordination of Care X - Simple Patient / Family Education for ongoing care 1 15 []  - 0 Complex (extensive) Patient / Family Education for ongoing care X- 1 10 Staff obtains Chiropractor, Records, T Results / Process Orders est X- 1 10 Staff telephones HHA, Nursing Homes / Clarify orders / etc []  - 0 Routine Transfer to another Facility (non-emergent condition) []  - 0 Routine Hospital Admission (non-emergent condition) X- 1 15 New Admissions / Manufacturing engineer / Ordering NPWT Apligraf, etc. , []  - 0 Emergency Hospital Admission (emergent condition) PROCESS - Special Needs []  - 0 Pediatric / Minor Patient Management []  - 0 Isolation Patient Management []  - 0 Hearing / Language / Visual special needs []  - 0 Assessment of Community assistance (transportation, D/C planning, etc.) []  - 0 Additional assistance / Altered mentation []  - 0 Support Surface(s) Assessment (bed, cushion, seat, etc.) INTERVENTIONS - Miscellaneous []  - 0 External ear exam []  - 0 Patient Transfer (multiple staff / Nurse, adult / Similar devices) X- 1 5 Simple Staple / Suture removal (25 or less) []  -  0 Complex Staple / Suture removal (26 or more) []  - 0 Hypo/Hyperglycemic Management (do not check if billed separately) []  - 0 Ankle / Brachial Index (ABI) - do not check if billed separately Has the patient been seen at the hospital within the last three years: Yes Total Score: 110 Level Of Care: New/Established -  Level 3 Electronic Signature(s) Signed: 04/12/2023 5:34:23 PM By: Karie Schwalbe RN Entered By: Karie Schwalbe on 04/12/2023 16:20:39 -------------------------------------------------------------------------------- Encounter Discharge Information Details Patient Name: Date of Service: [name], [name] 04/12/2023 12:30 PM Wanat, Dewana ([id number]) [id number] Page 3 of 7 Medical Record Number: [id number] Patient Account Number: [id number] Date of Birth/Sex: Treating RN: 1976/03/27 (47 y.o. Katrinka Blazing Primary Care Tynetta Bachmann: Dulce Sellar Other Clinician: Referring Zinnia Tindall: Treating Abdel Effinger/Extender: Milbert Coulter in Treatment: 0 Encounter Discharge Information Items Post Procedure Vitals Discharge Condition: Stable Temperature (F): 99.1 Ambulatory Status: Ambulatory Pulse (bpm): 81 Discharge Destination: Home Respiratory Rate (breaths/min): 16 Transportation: Private Auto Blood Pressure (mmHg): 139/84 Accompanied By: self Schedule Follow-up Appointment: Yes Clinical Summary of Care: Patient Declined Electronic Signature(s) Signed: 04/12/2023 5:34:23 PM By: Karie Schwalbe RN Entered By: Karie Schwalbe on 04/12/2023 17:05:49 -------------------------------------------------------------------------------- Lower Extremity Assessment Details Patient Name: Date of Service: Niel Hummer 04/12/2023 12:30 PM Medical Record Number: [id number] Patient Account Number: [id number] Date of Birth/Sex: Treating RN: 21-Nov-1975 (47 y.o. Katrinka Blazing Primary Care Raeqwon Lux: Dulce Sellar Other  Clinician: Referring Dangela How: Treating Makarios Madlock/Extender: Milbert Coulter in Treatment: 0 Electronic Signature(s) Signed: 04/12/2023 5:34:23 PM By: Karie Schwalbe RN Entered By: Karie Schwalbe on 04/12/2023 13:09:11 -------------------------------------------------------------------------------- Multi-Disciplinary Care Plan Details Patient Name: Date of Service: [name], [name] 04/12/2023 12:30 PM Medical Record Number: [id number] Patient Account Number: [id number] Date of Birth/Sex: Treating RN: 1976/04/22 (47 y.o. Katrinka Blazing Primary Care Ashlley Booher: Dulce Sellar Other Clinician: Referring Tyberius Ryner: Treating Kaitlinn Iversen/Extender: Milbert Coulter in Treatment: 0 Active Inactive Wound/Skin Impairment Nursing Diagnoses: Impaired tissue integrity Goals: Patient/caregiver will verbalize understanding of skin care regimen Date Initiated: 04/12/2023 Target Resolution Date: 05/24/2023 Goal Status: Active Interventions: Assess ulceration(s) every visit Jackson, Nivea ([id number]) [id number] Page 4 of 7 Treatment Activities: Skin care regimen initiated : 04/12/2023 Notes: Electronic Signature(s) Signed: 04/12/2023 5:34:23 PM By: Karie Schwalbe RN Entered By: Karie Schwalbe on 04/12/2023 16:30:20 -------------------------------------------------------------------------------- Pain Assessment Details Patient Name: Date of Service: Niel Hummer 04/12/2023 12:30 PM Medical Record Number: [id number] Patient Account Number: [id number] Date of Birth/Sex: Treating RN: 18-Sep-1976 (47 y.o. Katrinka Blazing Primary Care Dora Clauss: Dulce Sellar Other Clinician: Referring Martavion Couper: Treating Analeigh Aries/Extender: Milbert Coulter in Treatment: 0 Active Problems Location of Pain Severity and Description of Pain Patient Has Paino No Site Locations Pain Management and  Medication Current Pain Management: Electronic Signature(s) Signed: 04/12/2023 5:34:23 PM By: Karie Schwalbe RN Entered By: Karie Schwalbe on 04/12/2023 13:12:58 -------------------------------------------------------------------------------- Patient/Caregiver Education Details Patient Name: Date of Service: Niel Hummer 6/19/2024andnbsp12:30 PM Medical Record Number: [id number] Patient Account Number: [id number] Date of Birth/Gender: Treating RN: 19-May-1976 (47 y.o. Katrinka Blazing Primary Care Physician: Dulce Sellar Other Clinician: Referring Physician: Treating Physician/Extender: Milbert Coulter in Treatment: 0 Boone, Brandi ([id number]) [id number] Page 5 of 7 Education Assessment Education Provided To: Patient Education Topics Provided Wound/Skin Impairment: Methods: Explain/Verbal Responses: Return demonstration correctly Electronic Signature(s) Signed: 04/12/2023 5:34:23 PM By: Karie Schwalbe RN Entered By: Karie Schwalbe on 04/12/2023 16:30:29 -------------------------------------------------------------------------------- Wound Assessment Details Patient Name: Date of Service: Niel Hummer 04/12/2023 12:30 PM Medical Record Number: [id number] Patient Account Number: [id number] Date of Birth/Sex: Treating RN: [birth year] (47 y.o. Katrinka Blazing Primary Care Amahri Dengel: Dulce Sellar Other Clinician: Referring Deonna Krummel: Treating Vania Rosero/Extender: Milbert Coulter in Treatment: 0 Wound Status Wound Number: 1 Primary Etiology: Abscess Wound Location:  Umbilicus Wound Status: Open Wounding Event: Surgical Injury Comorbid History: Hypertension Date Acquired: 02/17/2023 Weeks Of Treatment: 0 Clustered Wound: No Photos Wound Measurements Length: (cm) 0.6 Width: (cm) 0.6 Depth: (cm) 3.5 Area: (cm) 0.283 Volume: (cm) 0.99 % Reduction in Area: % Reduction in  Volume: Epithelialization: None Tunneling: No Undermining: No Wound Description Classification: Full Thickness Without Exposed Support Exudate Amount: Medium Exudate Type: Serosanguineous Exudate Color: red, brown Structures Foul Odor After Cleansing: No Slough/Fibrino Yes Wound Bed Granulation Amount: Large (67-100%) Exposed Structure Granulation Quality: Red, Hyper-granulation Fascia Exposed: No Necrotic Amount: Small (1-33%) Fat Layer (Subcutaneous Tissue) Exposed: Yes Gabor, Sevannah ([id number]) [id number] Page 6 of 7 Necrotic Quality: Adherent Slough Tendon Exposed: No Muscle Exposed: No Joint Exposed: No Bone Exposed: No Periwound Skin Texture Texture Color No Abnormalities Noted: No No Abnormalities Noted: No Callus: No Atrophie Blanche: No Crepitus: No Cyanosis: No Excoriation: No Ecchymosis: No Induration: No Erythema: No Rash: No Hemosiderin Staining: No Scarring: No Mottled: No Pallor: No Moisture Rubor: No No Abnormalities Noted: Yes Temperature / Pain Temperature: No Abnormality Treatment Notes Wound #1 (Umbilicus) Cleanser Soap and Water Discharge Instruction: May shower and wash wound with dial antibacterial soap and water prior to dressing change. Vashe 5.8 (oz) Discharge Instruction: Cleanse the wound with Vashe prior to applying a clean dressing using gauze sponges, not tissue or cotton balls. Peri-Wound Care Topical Primary Dressing Hydrofera Blue Classic Foam Rope Dressing, 9x6 (mm/in) Discharge Instruction: Moisten with saline after packing into the belly button Secondary Dressing Woven Gauze Sponge, Non-Sterile 4x4 in Discharge Instruction: Apply over primary dressing as directed. Secured With Transpore Surgical Tape, 2x10 (in/yd) Discharge Instruction: Secure dressing with tape as directed. Compression Wrap Compression Stockings Add-Ons Electronic Signature(s) Signed: 04/12/2023 5:34:23 PM By:  Karie Schwalbe RN Entered By: Karie Schwalbe on 04/12/2023 13:34:47 -------------------------------------------------------------------------------- Vitals Details Patient Name: Date of Service: [name], [name] 04/12/2023 12:30 PM Medical Record Number: [id number] Patient Account Number: [id number] Date of Birth/Sex: Treating RN: 1976/06/05 (46 y.o. Katrinka Blazing Primary Care Nathan Stallworth: Dulce Sellar Other Clinician: Referring Ridgely Anastacio: Treating June Vacha/Extender: Milbert Coulter in Treatment: 0 Vital Signs Time Taken: 12:49 Respiratory Rate (breaths/min): 16 Bleecker, Dashley ([id number]) [id number] Page 7 of 7 Reference Range: 80 - 120 mg / dl Electronic Signature(s) Signed: 04/12/2023 5:34:23 PM By: Karie Schwalbe RN Entered By: Karie Schwalbe on 04/12/2023 13:01:07

## 2023-04-13 NOTE — Progress Notes (Signed)
BECKER, Juri ([id number]) [id number] Page 1 of 4 Visit Report for 04/12/2023 Abuse Risk Screen Details Patient Name: Date of Service: Haley Wyatt 04/12/2023 12:30 PM Medical Record Number: [id number] Patient Account Number: [id number] Date of Birth/Sex: Treating RN: October 16, 1976 (47 y.o. Katrinka Blazing Primary Care Murray Durrell: Dulce Sellar Other Clinician: Referring Khaidyn Staebell: Treating Gertie Broerman/Extender: Milbert Coulter in Treatment: 0 Abuse Risk Screen Items Answer ABUSE RISK SCREEN: Has anyone close to you tried to hurt or harm you recentlyo No Do you feel uncomfortable with anyone in your familyo No Has anyone forced you do things that you didnt want to doo No Electronic Signature(s) Signed: 04/12/2023 5:34:23 PM By: Karie Schwalbe RN Entered By: Karie Schwalbe on 04/12/2023 13:02:59 -------------------------------------------------------------------------------- Activities of Daily Living Details Patient Name: Date of Service: Haley Wyatt 04/12/2023 12:30 PM Medical Record Number: [id number] Patient Account Number: [id number] Date of Birth/Sex: Treating RN: 06-15-1976 (47 y.o. Katrinka Blazing Primary Care Aysia Lowder: Dulce Sellar Other Clinician: Referring Vella Colquitt: Treating Kieana Livesay/Extender: Milbert Coulter in Treatment: 0 Activities of Daily Living Items Answer Activities of Daily Living (Please select one for each item) Drive Automobile Completely Able T Medications ake Completely Able Use T elephone Completely Able Care for Appearance Completely Able Use T oilet Completely Able Bath / Shower Completely Able Dress Self Completely Able Feed Self Completely Able Walk Completely Able Get In / Out Bed Completely Able Housework Completely Able Prepare Meals Completely Able Handle Money Completely Able Shop for Self Completely Able Electronic  Signature(s) Signed: 04/12/2023 5:34:23 PM By: Karie Schwalbe RN Entered By: Karie Schwalbe on 04/12/2023 13:03:33 Stoffers, Jeanene ([id number]) [phone] [id number] Page 2 of 4 -------------------------------------------------------------------------------- Education Screening Details Patient Name: Date of Service: Haley Wyatt 04/12/2023 12:30 PM Medical Record Number: [id number] Patient Account Number: [id number] Date of Birth/Sex: Treating RN: [birth year] (47 y.o. Katrinka Blazing Primary Care Jamey Demchak: Dulce Sellar Other Clinician: Referring Nannie Starzyk: Treating Jacquelene Kopecky/Extender: Milbert Coulter in Treatment: 0 Primary Learner Assessed: Patient Learning Preferences/Education Level/Primary Language Learning Preference: Explanation, Demonstration, Printed Material Highest Education Level: High School Preferred Language: English Cognitive Barrier Language Barrier: No Translator Needed: No Memory Deficit: No Emotional Barrier: No Cultural/Religious Beliefs Affecting Medical Care: No Physical Barrier Impaired Vision: No Impaired Hearing: No Decreased Hand dexterity: No Knowledge/Comprehension Knowledge Level: High Comprehension Level: High Ability to understand written instructions: High Ability to understand verbal instructions: High Motivation Anxiety Level: Calm Cooperation: Cooperative Education Importance: Acknowledges Need Interest in Health Problems: Asks Questions Perception: Coherent Willingness to Engage in Self-Management High Activities: Readiness to Engage in Self-Management High Activities: Electronic Signature(s) Signed: 04/12/2023 5:34:23 PM By: Karie Schwalbe RN Entered By: Karie Schwalbe on 04/12/2023 13:05:10 -------------------------------------------------------------------------------- Fall Risk Assessment Details Patient Name: Date of Service: [name], [name] 04/12/2023 12:30  PM Medical Record Number: [id number] Patient Account Number: [id number] Date of Birth/Sex: Treating RN: 01-Sep-1976 (46 y.o. Katrinka Blazing Primary Care Demika Langenderfer: Dulce Sellar Other Clinician: Referring Preston Weill: Treating Kennis Buell/Extender: Milbert Coulter in Treatment: 0 Fall Risk Assessment Items Have you had 2 or more falls in the last 12 monthso 0 No Jurich, Chihiro ([id number]) [phone] [id number] Page 3 of 4 Have you had any fall that resulted in injury in the last 12 monthso 0 No FALLS RISK SCREEN History of falling - immediate or within 3 months 0 No Secondary diagnosis (Do you have 2 or more medical diagnoseso) 0 No Ambulatory aid None/bed rest/wheelchair/nurse 0  No Crutches/cane/walker 0 No Furniture 0 No Intravenous therapy Access/Saline/Heparin Lock 0 No Gait/Transferring Normal/ bed rest/ wheelchair 0 No Weak (short steps with or without shuffle, stooped but able to lift head while walking, may seek 0 No support from furniture) Impaired (short steps with shuffle, may have difficulty arising from chair, head down, impaired 0 No balance) Mental Status Oriented to own ability 0 No Electronic Signature(s) Signed: 04/12/2023 5:34:23 PM By: Karie Schwalbe RN Entered By: Karie Schwalbe on 04/12/2023 13:08:44 -------------------------------------------------------------------------------- Foot Assessment Details Patient Name: Date of Service: [name] 04/12/2023 12:30 PM Medical Record Number: [id number] Patient Account Number: [id number] Date of Birth/Sex: Treating RN: Mar 12, 1976 (47 y.o. Katrinka Blazing Primary Care Aleysia Oltmann: Dulce Sellar Other Clinician: Referring Juanelle Trueheart: Treating Tyyonna Soucy/Extender: Milbert Coulter in Treatment: 0 Foot Assessment Items Site Locations + = Sensation present, - = Sensation absent, C = Callus, U = Ulcer R = Redness, W = Warmth, M =  Maceration, PU = Pre-ulcerative lesion F = Fissure, S = Swelling, D = Dryness Assessment Right: Left: Other Deformity: No No Prior Foot Ulcer: No No Prior Amputation: No No Charcot Joint: No No Ambulatory Status: Ambulatory Without Help GaitCahterine Conneely, Aristea ([id number]) [id number] Page 4 of 4 Electronic Signature(s) Signed: 04/12/2023 5:34:23 PM By: Karie Schwalbe RN Entered By: Karie Schwalbe on 04/12/2023 13:08:59 -------------------------------------------------------------------------------- Nutrition Risk Screening Details Patient Name: Date of Service: Haley Wyatt 04/12/2023 12:30 PM Medical Record Number: [id number] Patient Account Number: [id number] Date of Birth/Sex: Treating RN: 1976/09/02 (47 y.o. Katrinka Blazing Primary Care Evelio Rueda: Dulce Sellar Other Clinician: Referring Dushaun Okey: Treating Jerald Villalona/Extender: Milbert Coulter in Treatment: 0 Height (in): Weight (lbs): Body Mass Index (BMI): Nutrition Risk Screening Items Score Screening NUTRITION RISK SCREEN: I have an illness or condition that made me change the kind and/or amount of food I eat 0 No I eat fewer than two meals per day 0 No I eat few fruits and vegetables, or milk products 0 No I have three or more drinks of beer, liquor or wine almost every day 0 No I have tooth or mouth problems that make it hard for me to eat 0 No I don't always have enough money to buy the food I need 0 No I eat alone most of the time 0 No I take three or more different prescribed or over-the-counter drugs a day 0 No Without wanting to, I have lost or gained 10 pounds in the last six months 0 No I am not always physically able to shop, cook and/or feed myself 0 No Nutrition Protocols Good Risk Protocol 0 No interventions needed Moderate Risk Protocol High Risk Proctocol Risk Level: Good Risk Score: 0 Electronic Signature(s) Signed:  04/12/2023 5:34:23 PM By: Karie Schwalbe RN Entered By: Karie Schwalbe on 04/12/2023 13:08:51

## 2023-04-14 NOTE — Progress Notes (Signed)
FIGEROA, Mixtli ([id number]) [id number] Page 1 of 8 Visit Report for 04/12/2023 Chief Complaint Document Details Patient Name: Date of Service: Haley Wyatt 04/12/2023 12:30 PM Medical Record Number: [id number] Patient Account Number: [id number] Date of Birth/Sex: Treating RN: 1976-10-18 (47 y.o. F) Primary Care Provider: Dulce Sellar Other Clinician: Referring Provider: Treating Provider/Extender: Milbert Coulter in Treatment: 0 Information Obtained from: Patient Chief Complaint Umbilical surgical ulcer s/p abdominoplasty Electronic Signature(s) Signed: 04/18/2023 1:26:10 PM By: Allen Derry PA-C Entered By: Allen Derry on 04/18/2023 13:26:10 -------------------------------------------------------------------------------- Debridement Details Patient Name: Date of Service: Haley Wyatt, [name] 04/12/2023 12:30 PM Medical Record Number: [id number] Patient Account Number: [id number] Date of Birth/Sex: Treating RN: 1976/02/03 (47 y.o. Katrinka Blazing Primary Care Provider: Dulce Sellar Other Clinician: Referring Provider: Treating Provider/Extender: Milbert Coulter in Treatment: 0 Debridement Performed for Assessment: Wound #1 Umbilicus Performed By: Physician Lenda Kelp, PA Debridement Type: Chemical/Enzymatic/Mechanical Agent Used: gauze and Vashe Level of Consciousness (Pre-procedure): Awake and Alert Pre-procedure Verification/Time Out Yes - 12:55 Taken: Start Time: 12:55 Percent of Wound Bed Debrided: Instrument: Forceps, Scissors Bleeding: Minimum Hemostasis Achieved: Silver Nitrate End Time: 12:58 Procedural Pain: 0 Post Procedural Pain: 0 Response to Treatment: Procedure was tolerated well Level of Consciousness (Post- Awake and Alert procedure): Post Debridement Measurements of Total Wound Length: (cm) 0.6 Width: (cm) 0.6 Depth: (cm) 3.5 Volume: (cm) 0.99 Character  of Wound/Ulcer Post Debridement: Improved Post Procedure Diagnosis Same as Pre-procedure Haley Wyatt, Haley Wyatt ([id number]) [phone].pdf Page 2 of 8 Notes Scribed for Allen Derry III PA-C removed 3 sutures which were embedded, by J.Scotton Electronic Signature(s) Signed: 04/12/2023 5:34:23 PM By: Karie Schwalbe RN Signed: 04/13/2023 5:52:49 PM By: Allen Derry PA-C Entered By: Karie Schwalbe on 04/12/2023 13:16:22 -------------------------------------------------------------------------------- HPI Details Patient Name: Date of Service: Haley Wyatt, [name] 04/12/2023 12:30 PM Medical Record Number: [id number] Patient Account Number: [id number] Date of Birth/Sex: Treating RN: [birth year] (46 y.o. F) Primary Care Provider: Dulce Sellar Other Clinician: Referring Provider: Treating Provider/Extender: Milbert Coulter in Treatment: 0 History of Present Illness HPI Description: 04-12-2023 upon evaluation today patient appears to be doing poorly today with regard to her wound. She subsequently has an umbilical ulcer which is actually secondary to surgery. She had a extensive [place] and subsequently they did an extremely good job with that which is awesome. With that being said she unfortunately has several sutures that are actually embedded in the umbilicus region which she was not able to get anyone to take out at her previous clinic where she has been being seen. With that being said other than the swollen area everything else has been healing nicely. Nonetheless I do believe this is actually somewhat deep-rooted beyond where just the sutures need to be removed to be honest. Electronic Signature(s) Signed: 04/18/2023 1:26:43 PM By: Allen Derry PA-C Entered By: Allen Derry on 04/18/2023 13:26:43 -------------------------------------------------------------------------------- Physical Exam Details Patient  Name: Date of Service: Haley Wyatt 04/12/2023 12:30 PM Medical Record Number: [id number] Patient Account Number: [id number] Date of Birth/Sex: Treating RN: [birth year] (47 y.o. F) Primary Care Provider: Dulce Sellar Other Clinician: Referring Provider: Treating Provider/Extender: Milbert Coulter in Treatment: 0 Constitutional Well-nourished and well-hydrated in no acute distress. Eyes conjunctiva clear no eyelid edema noted. pupils equal round and reactive to light and accommodation. Ears, Nose, Mouth, and Throat no gross abnormality of ear auricles or external auditory canals. normal hearing  noted during conversation. mucus membranes moist. Respiratory normal breathing without difficulty. Musculoskeletal normal gait and posture. no significant deformity or arthritic changes, no loss or range of motion, no clubbing. Psychiatric this patient is able to make decisions and demonstrates good insight into disease process. Alert and Oriented x 3. pleasant and cooperative. Notes Haley Wyatt, Haley Wyatt ([id number]) [id number] Page 3 of 8 Upon inspection patient had 3 what appeared to be embedded sutures that were barely visible just in small areas at several locations on the sidewalls of what was attempting to be the umbilicus area. With that being said I she showed me pictures of where these were previous and they were much more exposed and could have been easily removed however right now this is not can be easy whatsoever and I discussed that with her today. Subsequently I did however go ahead and do what I could to try to clearway some of the drainage that I saw she did have a spacer plug that they were trying to use to keep the bellybutton present for cosmetic reasons. With that being said I do not think this is going to be ideal in fact I think she may actually have an infection subsequent to this and we need to keep this from being an  ongoing issue. For that reason I did go ahead and have her remove that we will get into packing this with Hydrofera Blue I was however able to retrieve the 3 sutures although it was very difficult I had to be very careful with this and in fact being that there was some bed at pretty much just had to pull them out of the tissue in which they had embedded ingrowing into. She was extremely thankful. Electronic Signature(s) Signed: 04/18/2023 3:16:18 PM By: Allen Derry PA-C Previous Signature: 04/18/2023 3:16:02 PM Version By: Allen Derry PA-C Entered By: Allen Derry on 04/18/2023 15:16:18 -------------------------------------------------------------------------------- Physician Orders Details Patient Name: Date of Service: Haley Wyatt, [name] 04/12/2023 12:30 PM Medical Record Number: [id number] Patient Account Number: [id number] Date of Birth/Sex: Treating RN: 1976/05/13 (47 y.o. Katrinka Blazing Primary Care Provider: Dulce Sellar Other Clinician: Referring Provider: Treating Provider/Extender: Milbert Coulter in Treatment: 0 Verbal / Phone Orders: No Diagnosis Coding Follow-up Appointments ppointment in 1 week. Allen Derry III PA Room 9 Wednesday June 26th at 3:30pm Return A Anesthetic (In clinic) Topical Lidocaine 4% applied to wound bed Bathing/ Shower/ Hygiene Other Bathing/Shower/Hygiene Orders/Instructions: - May bathe and change dressing (wound) after bathing. Additional Orders / Instructions Other: - Please do not use the Umbillicus plug (belly button) at this time. Thank you Wound Treatment Wound #1 - Umbilicus Cleanser: Soap and Water Every Other Day/30 Days Discharge Instructions: May shower and wash wound with dial antibacterial soap and water prior to dressing change. Cleanser: Vashe 5.8 (oz) (DME) (Generic) Every Other Day/30 Days Discharge Instructions: Cleanse the wound with Vashe prior to applying a clean dressing using gauze sponges, not  tissue or cotton balls. Prim Dressing: Hydrofera Blue Classic Foam Rope Dressing, 9x6 (mm/in) (DME) (Generic) Every Other Day/30 Days ary Discharge Instructions: Moisten with saline after packing into the belly button Secondary Dressing: Woven Gauze Sponge, Non-Sterile 4x4 in (DME) (Generic) Every Other Day/30 Days Discharge Instructions: Apply over primary dressing as directed. Secured With: Transpore Surgical Tape, 2x10 (in/yd) (Generic) Every Other Day/30 Days Discharge Instructions: Secure dressing with tape as directed. Electronic Signature(s) Signed: 04/12/2023 5:34:23 PM By: Karie Schwalbe RN Signed: 04/13/2023 5:52:49 PM By: Allen Derry PA-C  Entered By: Karie Schwalbe on 04/12/2023 16:27:51 Haley Wyatt, Haley Wyatt ([id number]) [id number] Page 4 of 8 -------------------------------------------------------------------------------- Problem List Details Patient Name: Date of Service: Haley Wyatt 04/12/2023 12:30 PM Medical Record Number: [id number] Patient Account Number: [id number] Date of Birth/Sex: Treating RN: [birth year] (47 y.o. F) Primary Care Provider: Dulce Sellar Other Clinician: Referring Provider: Treating Provider/Extender: Milbert Coulter in Treatment: 0 Active Problems ICD-10 Encounter Code Description Active Date MDM Diagnosis T81.31XA Disruption of external operation (surgical) wound, not elsewhere classified, 04/12/2023 No Yes initial encounter L98.492 Non-pressure chronic ulcer of skin of other sites with fat layer exposed 04/12/2023 No Yes Inactive Problems Resolved Problems Electronic Signature(s) Signed: 04/18/2023 1:25:19 PM By: Allen Derry PA-C Entered By: Allen Derry on 04/18/2023 13:25:18 -------------------------------------------------------------------------------- Progress Note Details Patient Name: Date of Service: Haley Wyatt, [name] 04/12/2023 12:30 PM Medical Record Number:  [id number] Patient Account Number: [id number] Date of Birth/Sex: Treating RN: 07-16-1976 (47 y.o. F) Primary Care Provider: Dulce Sellar Other Clinician: Referring Provider: Treating Provider/Extender: Milbert Coulter in Treatment: 0 Subjective Chief Complaint Information obtained from Patient Umbilical surgical ulcer s/p abdominoplasty History of Present Illness (HPI) 04-12-2023 upon evaluation today patient appears to be doing poorly today with regard to her wound. She subsequently has an umbilical ulcer which is actually secondary to surgery. She had a extensive tummy tuck which was actually performed abroad and subsequently they did an extremely good job with that which is awesome. With that being said she unfortunately has several sutures that are actually embedded in the umbilicus region which she was not able to get anyone to take out at her previous clinic where she has been being seen. With that being said other than the swollen area everything else has been healing nicely. Nonetheless I do believe this is actually somewhat deep-rooted beyond where just the sutures need to be removed to be honest. Patient History Allergies No Known Drug Allergies Family History Cancer - Maternal Grandparents, Diabetes - Father,Paternal Grandparents, Heart Disease - Father, Hypertension - Father. Social History Former smoker - quit 2007, Marital Status - Single, Alcohol Use - Rarely, Drug Use - No History, Caffeine Use - Rarely. Medical History Cardiovascular Patient has history of Hypertension Haley Wyatt, Haley Wyatt ([id number]) [phone].pdf Page 5 of 8 Hospitalization/Surgery History - abdominoplasty/panniculectomy with liposuction 02/15/2023. - hysterectomy. - hiatal hernia repair 2015. - shoulder surgery. - carpel tunnel release. Medical A Surgical History Notes nd Constitutional Symptoms (General Health) hypothyroidism Eyes corneal  ulceration 05/2021 Gastrointestinal GERD Endocrine prediabetic 6.3 11/23 hgbA1c Oncologic fibroid tumor Psychiatric ADD ADHD Review of Systems (ROS) Integumentary (Skin) Complains or has symptoms of Wounds - Abdomen- 02/17/23 had tummy tuck in [place]. Objective Constitutional Well-nourished and well-hydrated in no acute distress. Vitals Time Taken: 12:49 PM, Respiratory Rate: 16 breaths/min. Eyes conjunctiva clear no eyelid edema noted. pupils equal round and reactive to light and accommodation. Ears, Nose, Mouth, and Throat no gross abnormality of ear auricles or external auditory canals. normal hearing noted during conversation. mucus membranes moist. Respiratory normal breathing without difficulty. Musculoskeletal normal gait and posture. no significant deformity or arthritic changes, no loss or range of motion, no clubbing. Psychiatric this patient is able to make decisions and demonstrates good insight into disease process. Alert and Oriented x 3. pleasant and cooperative. General Notes: Upon inspection patient had 3 what appeared to be embedded sutures that were barely visible just in small areas at several locations on the sidewalls of what was attempting to be the  umbilicus area. With that being said I she showed me pictures of where these were previous and they were much more exposed and could have been easily removed however right now this is not can be easy whatsoever and I discussed that with her today. Subsequently I did however go ahead and do what I could to try to clearway some of the drainage that I saw she did have a spacer plug that they were trying to use to keep the bellybutton present for cosmetic reasons. With that being said I do not think this is going to be ideal in fact I think she may actually have an infection subsequent to this and we need to keep this from being an ongoing issue. For that reason I did go ahead and have her remove that we will  get into packing this with Hydrofera Blue I was however able to retrieve the 3 sutures although it was very difficult I had to be very careful with this and in fact being that there was some bed at pretty much just had to pull them out of the tissue in which they had embedded ingrowing into. She was extremely thankful. Integumentary (Hair, Skin) Wound #1 status is Open. Original cause of wound was Surgical Injury. The date acquired was: 02/17/2023. The wound is located on the Umbilicus. The wound measures 0.6cm length x 0.6cm width x 3.5cm depth; 0.283cm^2 area and 0.99cm^3 volume. There is Fat Layer (Subcutaneous Tissue) exposed. There is no tunneling or undermining noted. There is a medium amount of serosanguineous drainage noted. There is large (67-100%) red, hyper - granulation within the wound bed. There is a small (1-33%) amount of necrotic tissue within the wound bed including Adherent Slough. The periwound skin appearance had no abnormalities noted for moisture. The periwound skin appearance did not exhibit: Callus, Crepitus, Excoriation, Induration, Rash, Scarring, Atrophie Blanche, Cyanosis, Ecchymosis, Hemosiderin Staining, Mottled, Pallor, Rubor, Erythema. Periwound temperature was noted as No Abnormality. Assessment Active Problems ICD-10 Disruption of external operation (surgical) wound, not elsewhere classified, initial encounter Non-pressure chronic ulcer of skin of other sites with fat layer exposed Procedures Haley Wyatt, Haley Wyatt ([id number]) [phone].pdf Page 6 of 8 Wound #1 Pre-procedure diagnosis of Wound #1 is an Abscess located on the Umbilicus . There was a Chemical/Enzymatic/Mechanical debridement performed by Lenda Kelp, PA. With the following instrument(s): Forceps, and Scissors to remove Non-Viable tissue/material.. Other agent used was gauze and Vashe. A time out was conducted at 12:55, prior to the start of the procedure. A Minimum amount of  bleeding was controlled with Silver Nitrate. The procedure was tolerated well with a pain level of 0 throughout and a pain level of 0 following the procedure. Post Debridement Measurements: 0.6cm length x 0.6cm width x 3.5cm depth; 0.99cm^3 volume. Character of Wound/Ulcer Post Debridement is improved. Post procedure Diagnosis Wound #1: Same as Pre-Procedure General Notes: Scribed for Allen Derry III PA-C removed 3 sutures which were embedded, by J.Scotton. Plan Follow-up Appointments: Return Appointment in 1 week. Allen Derry III PA Room 9 Wednesday June 26th at 3:30pm Anesthetic: (In clinic) Topical Lidocaine 4% applied to wound bed Bathing/ Shower/ Hygiene: Other Bathing/Shower/Hygiene Orders/Instructions: - May bathe and change dressing (wound) after bathing. Additional Orders / Instructions: Other: - Please do not use the Umbillicus plug (belly button) at this time. Thank you WOUND #1: - Umbilicus Wound Laterality: Cleanser: Soap and Water Every Other Day/30 Days Discharge Instructions: May shower and wash wound with dial antibacterial soap and water prior to dressing  change. Cleanser: Vashe 5.8 (oz) (DME) (Generic) Every Other Day/30 Days Discharge Instructions: Cleanse the wound with Vashe prior to applying a clean dressing using gauze sponges, not tissue or cotton balls. Prim Dressing: Hydrofera Blue Classic Foam Rope Dressing, 9x6 (mm/in) (DME) (Generic) Every Other Day/30 Days ary Discharge Instructions: Moisten with saline after packing into the belly button Secondary Dressing: Woven Gauze Sponge, Non-Sterile 4x4 in (DME) (Generic) Every Other Day/30 Days Discharge Instructions: Apply over primary dressing as directed. Secured With: Transpore Surgical T ape, 2x10 (in/yd) (Generic) Every Other Day/30 Days Discharge Instructions: Secure dressing with tape as directed. 1. Based on what I am seeing I am going to recommend at this point that we have the patient go ahead and continue  currently with the recommendation for Hydrofera Blue rope to be packed into this area I think this is the best way to go with a monitor for any signs of infection right now I am not certain there is infection with the same time I think this is something that definitely could become an issue we will monitor closely and see where things stand next week. 2. I am good recommend as well that the patient should change this daily I think that is probably the best regimen at this point. She is in agreement with the plan. 3. I am also going to recommend that she can switch to every other day dressing changes of the drainage allows but the Hydrofera Blue rope is going to allow for waking hours of the drainage. We will see patient back for reevaluation in 1 week here in the clinic. If anything worsens or changes patient will contact our office for additional recommendations. Electronic Signature(s) Signed: 04/18/2023 3:18:49 PM By: Allen Derry PA-C Entered By: Allen Derry on 04/18/2023 15:18:48 -------------------------------------------------------------------------------- HxROS Details Patient Name: Date of Service: Haley Wyatt, [name] 04/12/2023 12:30 PM Medical Record Number: [id number] Patient Account Number: [id number] Date of Birth/Sex: Treating RN: 1975-11-30 (47 y.o. Arta Silence Primary Care Provider: Dulce Sellar Other Clinician: Referring Provider: Treating Provider/Extender: Milbert Coulter in Treatment: 0 Integumentary (Skin) Complaints and Symptoms: Positive for: Wounds - Abdomen- 02/17/23 had tummy tuck in [place] Constitutional Symptoms (General Health) Medical History: Past Medical History Notes: hypothyroidism Haley Wyatt, Haley Wyatt ([id number]) [phone].pdf Page 7 of 8 Eyes Medical History: Past Medical History Notes: corneal ulceration 05/2021 Cardiovascular Medical History: Positive for:  Hypertension Gastrointestinal Medical History: Past Medical History Notes: GERD Endocrine Medical History: Past Medical History Notes: prediabetic 6.3 11/23 hgbA1c Oncologic Medical History: Past Medical History Notes: fibroid tumor Psychiatric Medical History: Past Medical History Notes: ADD ADHD Immunizations Pneumococcal Vaccine: Received Pneumococcal Vaccination: No Implantable Devices None Hospitalization / Surgery History Type of Hospitalization/Surgery abdominoplasty/panniculectomy with liposuction 02/15/2023 hysterectomy hiatal hernia repair 2015 shoulder surgery carpel tunnel release Family and Social History Cancer: Yes - Maternal Grandparents; Diabetes: Yes - Father,Paternal Grandparents; Heart Disease: Yes - Father; Hypertension: Yes - Father; Former smoker - quit 2007; Marital Status - Single; Alcohol Use: Rarely; Drug Use: No History; Caffeine Use: Rarely; Financial Concerns: No; Food, Clothing or Shelter Needs: No; Support System Lacking: No; Transportation Concerns: No Electronic Signature(s) Signed: 04/12/2023 3:27:24 PM By: Shawn Stall RN, BSN Signed: 04/12/2023 5:34:23 PM By: Karie Schwalbe RN Signed: 04/13/2023 5:52:49 PM By: Allen Derry PA-C Entered By: Karie Schwalbe on 04/12/2023 13:02:50 -------------------------------------------------------------------------------- SuperBill Details Patient Name: Date of Service: Haley Wyatt, [name] 04/12/2023 Medical Record Number: [id number] Patient Account Number: [id number] Date of Birth/Sex: Treating RN: 09-14-1976 (  47 y.o. Katrinka Blazing Primary Care Provider: Dulce Sellar Other ClinicianCecilio Wyatt, Haley Wyatt ([id number]) [id number] Page 8 of 8 Referring Provider: Treating Provider/Extender: Milbert Coulter in Treatment: 0 Diagnosis Coding ICD-10 Codes Code Description T81.31XA Disruption of external operation (surgical) wound, not elsewhere  classified, initial encounter L98.492 Non-pressure chronic ulcer of skin of other sites with fat layer exposed Facility Procedures : CPT4 Code: [id number] Description: 99213 - WOUND CARE VISIT-LEV 3 EST PT Modifier: Quantity: 1 Physician Procedures : CPT4 Code Description Modifier [id number] WC PHYS LEVEL 3 NEW PT ICD-10 Diagnosis Description T81.31XA Disruption of external operation (surgical) wound, not elsewhere classified, initial encounter L98.492 Non-pressure chronic ulcer of skin of other sites  with fat layer exposed Quantity: 1 Electronic Signature(s) Signed: 04/18/2023 3:19:10 PM By: Allen Derry PA-C Previous Signature: 04/12/2023 5:34:23 PM Version By: Karie Schwalbe RN Previous Signature: 04/13/2023 5:52:49 PM Version By: Allen Derry PA-C Entered By: Allen Derry on 04/18/2023 15:19:10

## 2023-04-17 DIAGNOSIS — S31105A Unspecified open wound of abdominal wall, periumbilic region without penetration into peritoneal cavity, initial encounter: Secondary | ICD-10-CM | POA: Diagnosis not present

## 2023-04-19 ENCOUNTER — Encounter (HOSPITAL_BASED_OUTPATIENT_CLINIC_OR_DEPARTMENT_OTHER): Payer: 59 | Admitting: Physician Assistant

## 2023-04-26 ENCOUNTER — Encounter (HOSPITAL_BASED_OUTPATIENT_CLINIC_OR_DEPARTMENT_OTHER): Payer: 59 | Attending: Physician Assistant | Admitting: Physician Assistant

## 2023-04-26 DIAGNOSIS — T8131XA Disruption of external operation (surgical) wound, not elsewhere classified, initial encounter: Secondary | ICD-10-CM | POA: Diagnosis not present

## 2023-04-26 DIAGNOSIS — L98492 Non-pressure chronic ulcer of skin of other sites with fat layer exposed: Secondary | ICD-10-CM | POA: Insufficient documentation

## 2023-04-26 DIAGNOSIS — I10 Essential (primary) hypertension: Secondary | ICD-10-CM | POA: Diagnosis not present

## 2023-04-26 NOTE — Progress Notes (Addendum)
Wyatt, Haley ([id number]) [id number] Page 1 of 5 Visit Report for 04/26/2023 Chief Complaint Document Details Patient Name: Date of Service: Haley Wyatt 04/26/2023 10:45 A M Medical Record Number: [id number] Patient Account Number: [id number] Date of Birth/Sex: Treating RN: 03/24/1976 (47 y.o. F) Primary Care Provider: Dulce Sellar Other Clinician: Referring Provider: Treating Provider/Extender: Milbert Coulter in Treatment: 2 Information Obtained from: Patient Chief Complaint Umbilical surgical ulcer s/p abdominoplasty Electronic Signature(s) Signed: 04/26/2023 11:10:25 AM By: Allen Derry PA-C Entered By: Allen Derry on 04/26/2023 11:10:25 -------------------------------------------------------------------------------- HPI Details Patient Name: Date of Service: Haley Wyatt, [name] 04/26/2023 10:45 A M Medical Record Number: [id number] Patient Account Number: [id number] Date of Birth/Sex: Treating RN: Jun 26, 1976 (47 y.o. F) Primary Care Provider: Dulce Sellar Other Clinician: Referring Provider: Treating Provider/Extender: Milbert Coulter in Treatment: 2 History of Present Illness HPI Description: 04-12-2023 upon evaluation today patient appears to be doing poorly today with regard to her wound. She subsequently has an umbilical ulcer which is actually secondary to surgery. She had a extensive [place] and subsequently they did an extremely good job with that which is awesome. With that being said she unfortunately has several sutures that are actually embedded in the umbilicus region which she was not able to get anyone to take out at her previous clinic where she has been being seen. With that being said other than the swollen area everything else has been healing nicely. Nonetheless I do believe this is actually somewhat deep-rooted beyond where just the  sutures need to be removed to be honest. 04-26-2023 upon evaluation today patient appears to be doing well currently in regard to her umbilical ulcer. This is a surgical wound and does seem to be doing much better. Fortunately I do not see any evidence of active infection locally nor systemically which is great news. No fevers, chills, nausea, vomiting, or diarrhea. Electronic Signature(s) Signed: 04/26/2023 2:47:53 PM By: Allen Derry PA-C Entered By: Allen Derry on 04/26/2023 14:47:53 -------------------------------------------------------------------------------- Physical Exam Details Patient Name: Date of Service: Haley Wyatt 04/26/2023 10:45 A M Medical Record Number: [id number] Patient Account Number: [id number] Haley Wyatt, Haley Wyatt ([id number]) [id number] Page 2 of 5 Date of Birth/Sex: Treating RN: December 22, 1975 (47 y.o. F) Primary Care Provider: Other Clinician: Dulce Sellar Referring Provider: Treating Provider/Extender: Milbert Coulter in Treatment: 2 Constitutional Well-nourished and well-hydrated in no acute distress. Respiratory normal breathing without difficulty. Psychiatric this patient is able to make decisions and demonstrates good insight into disease process. Alert and Oriented x 3. pleasant and cooperative. Notes Upon inspection patient's wound bed showed evidence of good granulation and epithelization at this time. Fortunately I do not see any evidence of worsening overall and I think that we are making good headway towards closure. I do think that this is significantly improved compared to last time I saw her. Electronic Signature(s) Signed: 04/26/2023 2:48:14 PM By: Allen Derry PA-C Entered By: Allen Derry on 04/26/2023 14:48:14 -------------------------------------------------------------------------------- Physician Orders Details Patient Name: Date of Service: Haley Wyatt, [name] 04/26/2023 10:45 A M Medical  Record Number: [id number] Patient Account Number: [id number] Date of Birth/Sex: Treating RN: [birth year] (47 y.o. Arta Silence Primary Care Provider: Dulce Sellar Other Clinician: Referring Provider: Treating Provider/Extender: Milbert Coulter in Treatment: 2 Verbal / Phone Orders: No Diagnosis Coding ICD-10 Coding Code Description T81.31XA Disruption of external operation (surgical) wound, not elsewhere classified, initial encounter L98.492 Non-pressure  chronic ulcer of skin of other sites with fat layer exposed Follow-up Appointments ppointment in 1 week. Allen Derry III PA Room 9 Wednesday 05/03/2023 1100 Return A Anesthetic (In clinic) Topical Lidocaine 4% applied to wound bed Bathing/ Shower/ Hygiene Other Bathing/Shower/Hygiene Orders/Instructions: - May bathe and change dressing (wound) after bathing. Additional Orders / Instructions Other: - Please do not use the Umbillicus plug (belly button) at this time. Thank you Wound Treatment Wound #1 - Umbilicus Cleanser: Soap and Water Every Other Day/30 Days Discharge Instructions: May shower and wash wound with dial antibacterial soap and water prior to dressing change. Cleanser: Vashe 5.8 (oz) (Generic) Every Other Day/30 Days Discharge Instructions: Cleanse the wound with Vashe prior to applying a clean dressing using gauze sponges, not tissue or cotton balls. Prim Dressing: Hydrofera Blue Classic Foam Rope Dressing, 9x6 (mm/in) (Generic) Every Other Day/30 Days ary Discharge Instructions: Moisten with saline after packing into the belly button Secondary Dressing: Woven Gauze Sponge, Non-Sterile 4x4 in (Generic) Every Other Day/30 Days Discharge Instructions: Apply over primary dressing as directed. Secured With: Transpore Surgical Tape, 2x10 (in/yd) (Generic) Every Other Day/30 Days Haley Wyatt, Haley Wyatt ([id number]) [id number] Page 3 of 5 Discharge Instructions: Secure  dressing with tape as directed. Electronic Signature(s) Signed: 04/26/2023 3:56:42 PM By: Allen Derry PA-C Signed: 04/26/2023 4:48:32 PM By: Shawn Stall RN, BSN Entered By: Shawn Stall on 04/26/2023 11:28:02 -------------------------------------------------------------------------------- Problem List Details Patient Name: Date of Service: Haley Wyatt, [name] 04/26/2023 10:45 A M Medical Record Number: [id number] Patient Account Number: [id number] Date of Birth/Sex: Treating RN: 1976-05-14 (47 y.o. F) Primary Care Provider: Dulce Sellar Other Clinician: Referring Provider: Treating Provider/Extender: Milbert Coulter in Treatment: 2 Active Problems ICD-10 Encounter Code Description Active Date MDM Diagnosis T81.31XA Disruption of external operation (surgical) wound, not elsewhere classified, 04/12/2023 No Yes initial encounter L98.492 Non-pressure chronic ulcer of skin of other sites with fat layer exposed 04/12/2023 No Yes Inactive Problems Resolved Problems Electronic Signature(s) Signed: 04/26/2023 11:10:11 AM By: Allen Derry PA-C Entered By: Allen Derry on 04/26/2023 11:10:11 -------------------------------------------------------------------------------- Progress Note Details Patient Name: Date of Service: Haley Wyatt, [name] 04/26/2023 10:45 A M Medical Record Number: [id number] Patient Account Number: [id number] Date of Birth/Sex: Treating RN: 07-06-1976 (47 y.o. F) Primary Care Provider: Dulce Sellar Other Clinician: Referring Provider: Treating Provider/Extender: Milbert Coulter in Treatment: 2 Subjective Chief Complaint Information obtained from Patient Umbilical surgical ulcer s/p abdominoplasty History of Present Illness (HPI) Haley Wyatt, Haley Wyatt ([id number]) [id number] Page 4 of 5 04-12-2023 upon evaluation today patient appears to be doing poorly today with regard to her wound. She  subsequently has an umbilical ulcer which is actually secondary to surgery. She had a extensive tummy tuck which was actually performed abroad and subsequently they did an extremely good job with that which is awesome. With that being said she unfortunately has several sutures that are actually embedded in the umbilicus region which she was not able to get anyone to take out at her previous clinic where she has been being seen. With that being said other than the swollen area everything else has been healing nicely. Nonetheless I do believe this is actually somewhat deep-rooted beyond where just the sutures need to be removed to be honest. 04-26-2023 upon evaluation today patient appears to be doing well currently in regard to her umbilical ulcer. This is a surgical wound and does seem to be doing much better. Fortunately I do not see any evidence of active infection  locally nor systemically which is great news. No fevers, chills, nausea, vomiting, or diarrhea. Objective Constitutional Well-nourished and well-hydrated in no acute distress. Vitals Time Taken: 11:07 AM, Temperature: 98.2 F, Pulse: 96 bpm, Respiratory Rate: 20 breaths/min, Blood Pressure: 163/106 mmHg. Respiratory normal breathing without difficulty. Psychiatric this patient is able to make decisions and demonstrates good insight into disease process. Alert and Oriented x 3. pleasant and cooperative. General Notes: Upon inspection patient's wound bed showed evidence of good granulation and epithelization at this time. Fortunately I do not see any evidence of worsening overall and I think that we are making good headway towards closure. I do think that this is significantly improved compared to last time I saw her. Integumentary (Hair, Skin) Wound #1 status is Open. Original cause of wound was Surgical Injury. The date acquired was: 02/17/2023. The wound has been in treatment 2 weeks. The wound is located on the Umbilicus. The wound  measures 0.4cm length x 0.4cm width x 3.3cm depth; 0.126cm^2 area and 0.415cm^3 volume. There is Fat Layer (Subcutaneous Tissue) exposed. There is no tunneling or undermining noted. There is a medium amount of serosanguineous drainage noted. The wound margin is distinct with the outline attached to the wound base. There is large (67-100%) red granulation within the wound bed. There is no necrotic tissue within the wound bed. The periwound skin appearance had no abnormalities noted for moisture. The periwound skin appearance did not exhibit: Callus, Crepitus, Excoriation, Induration, Rash, Scarring, Atrophie Blanche, Cyanosis, Ecchymosis, Hemosiderin Staining, Mottled, Pallor, Rubor, Erythema. Periwound temperature was noted as No Abnormality. Assessment Active Problems ICD-10 Disruption of external operation (surgical) wound, not elsewhere classified, initial encounter Non-pressure chronic ulcer of skin of other sites with fat layer exposed Plan Follow-up Appointments: Return Appointment in 1 week. Allen Derry III PA Room 9 Wednesday 05/03/2023 1100 Anesthetic: (In clinic) Topical Lidocaine 4% applied to wound bed Bathing/ Shower/ Hygiene: Other Bathing/Shower/Hygiene Orders/Instructions: - May bathe and change dressing (wound) after bathing. Additional Orders / Instructions: Other: - Please do not use the Umbillicus plug (belly button) at this time. Thank you WOUND #1: - Umbilicus Wound Laterality: Cleanser: Soap and Water Every Other Day/30 Days Discharge Instructions: May shower and wash wound with dial antibacterial soap and water prior to dressing change. Cleanser: Vashe 5.8 (oz) (Generic) Every Other Day/30 Days Discharge Instructions: Cleanse the wound with Vashe prior to applying a clean dressing using gauze sponges, not tissue or cotton balls. Prim Dressing: Hydrofera Blue Classic Foam Rope Dressing, 9x6 (mm/in) (Generic) Every Other Day/30 Days ary Discharge Instructions: Moisten  with saline after packing into the belly button Secondary Dressing: Woven Gauze Sponge, Non-Sterile 4x4 in (Generic) Every Other Day/30 Days Discharge Instructions: Apply over primary dressing as directed. Secured With: Transpore Surgical T ape, 2x10 (in/yd) (Generic) Every Other Day/30 Days Discharge Instructions: Secure dressing with tape as directed. 1. I would recommend that we have the patient continue to monitor for any signs of infection or worsening overall and if anything changes she knows to let me Haley Wyatt, Haley Wyatt ([id number]) [id number] Page 5 of 5 know otherwise my plan is to continue with the [facility] Blue rope for the time being. 2. I am good recommend as well that she should continue with the Vashe moistened gauze to clean the area with. 3. I am also can recommend that she should continue with the bordered foam dressing to cover for a similar dressing. We will see patient back for reevaluation in 1 week here in the clinic.  If anything worsens or changes patient will contact our office for additional recommendations. Electronic Signature(s) Signed: 04/26/2023 2:48:45 PM By: Allen Derry PA-C Entered By: Allen Derry on 04/26/2023 14:48:45 -------------------------------------------------------------------------------- SuperBill Details Patient Name: Date of Service: Haley Wyatt, [name] 04/26/2023 Medical Record Number: [id number] Patient Account Number: [id number] Date of Birth/Sex: Treating RN: 03-Sep-1976 (47 y.o. [name], Yvonne Kendall Primary Care Provider: Dulce Sellar Other Clinician: Referring Provider: Treating Provider/Extender: Milbert Coulter in Treatment: 2 Diagnosis Coding ICD-10 Codes Code Description T81.31XA Disruption of external operation (surgical) wound, not elsewhere classified, initial encounter L98.492 Non-pressure chronic ulcer of skin of other sites with fat layer exposed Facility Procedures : CPT4  Code: 82956213 Description: 99213 - WOUND CARE VISIT-LEV 3 EST PT Modifier: Quantity: 1 Physician Procedures : CPT4 Code Description Modifier [id number] 99213 - WC PHYS LEVEL 3 - EST PT ICD-10 Diagnosis Description T81.31XA Disruption of external operation (surgical) wound, not elsewhere classified, initial encounter L98.492 Non-pressure chronic ulcer of skin of  other sites with fat layer exposed Quantity: 1 Electronic Signature(s) Signed: 04/26/2023 2:49:01 PM By: Allen Derry PA-C Entered By: Allen Derry on 04/26/2023 14:49:01

## 2023-04-26 NOTE — Progress Notes (Signed)
MARQUARDT, Danila ([id number]) [id number] Page 1 of 7 Visit Report for 04/26/2023 Arrival Information Details Patient Name: Date of Service: Haley Wyatt 04/26/2023 10:45 A M Medical Record Number: [id number] Patient Account Number: [id number] Date of Birth/Sex: Treating RN: 30-Jul-1976 (47 y.o. [name], Yvonne Kendall Primary Care Sada Mazzoni: Dulce Sellar Other Clinician: Referring Radames Mejorado: Treating Izzy Courville/Extender: Milbert Coulter in Treatment: 2 Visit Information History Since Last Visit Added or deleted any medications: No Patient Arrived: Ambulatory Any new allergies or adverse reactions: No Arrival Time: 11:06 Had a fall or experienced change in No Accompanied By: self activities of daily living that may affect Transfer Assistance: None risk of falls: Patient Identification Verified: Yes Signs or symptoms of abuse/neglect since last visito No Secondary Verification Process Completed: Yes Hospitalized since last visit: No Patient Requires Transmission-Based Precautions: No Implantable device outside of the clinic excluding No Patient Has Alerts: No cellular tissue based products placed in the center since last visit: Has Dressing in Place as Prescribed: Yes Pain Present Now: No Electronic Signature(s) Signed: 04/26/2023 4:48:32 PM By: Shawn Stall RN, BSN Entered By: Shawn Stall on 04/26/2023 11:07:53 -------------------------------------------------------------------------------- Clinic Level of Care Assessment Details Patient Name: Date of Service: Haley Wyatt 04/26/2023 10:45 A M Medical Record Number: [id number] Patient Account Number: [id number] Date of Birth/Sex: Treating RN: [birth year] (47 y.o. [name], Millard.Loa Primary Care Ahlayah Tarkowski: Dulce Sellar Other Clinician: Referring Tionne Carelli: Treating Marvetta Vohs/Extender: Milbert Coulter in Treatment: 2 Clinic Level of Care Assessment  Items TOOL 4 Quantity Score X- 1 0 Use when only an EandM is performed on FOLLOW-UP visit ASSESSMENTS - Nursing Assessment / Reassessment X- 1 10 Reassessment of Co-morbidities (includes updates in patient status) X- 1 5 Reassessment of Adherence to Treatment Plan ASSESSMENTS - Wound and Skin A ssessment / Reassessment X - Simple Wound Assessment / Reassessment - one wound 1 5 []  - 0 Complex Wound Assessment / Reassessment - multiple wounds []  - 0 Dermatologic / Skin Assessment (not related to wound area) ASSESSMENTS - Focused Assessment []  - 0 Circumferential Edema Measurements - multi extremities []  - 0 Nutritional Assessment / Counseling / Intervention Chawla, Claira ([id number]) [id number] Page 2 of 7 []  - 0 Lower Extremity Assessment (monofilament, tuning fork, pulses) []  - 0 Peripheral Arterial Disease Assessment (using hand held doppler) ASSESSMENTS - Ostomy and/or Continence Assessment and Care []  - 0 Incontinence Assessment and Management []  - 0 Ostomy Care Assessment and Management (repouching, etc.) PROCESS - Coordination of Care X - Simple Patient / Family Education for ongoing care 1 15 []  - 0 Complex (extensive) Patient / Family Education for ongoing care X- 1 10 Staff obtains Chiropractor, Records, T Results / Process Orders est []  - 0 Staff telephones HHA, Nursing Homes / Clarify orders / etc []  - 0 Routine Transfer to another Facility (non-emergent condition) []  - 0 Routine Hospital Admission (non-emergent condition) []  - 0 New Admissions / Manufacturing engineer / Ordering NPWT Apligraf, etc. , []  - 0 Emergency Hospital Admission (emergent condition) X- 1 10 Simple Discharge Coordination []  - 0 Complex (extensive) Discharge Coordination PROCESS - Special Needs []  - 0 Pediatric / Minor Patient Management []  - 0 Isolation Patient Management []  - 0 Hearing / Language / Visual special needs []  - 0 Assessment of  Community assistance (transportation, D/C planning, etc.) []  - 0 Additional assistance / Altered mentation []  - 0 Support Surface(s) Assessment (bed, cushion, seat, etc.) INTERVENTIONS - Wound Cleansing / Measurement X - Simple  Wound Cleansing - one wound 1 5 []  - 0 Complex Wound Cleansing - multiple wounds X- 1 5 Wound Imaging (photographs - any number of wounds) []  - 0 Wound Tracing (instead of photographs) X- 1 5 Simple Wound Measurement - one wound []  - 0 Complex Wound Measurement - multiple wounds INTERVENTIONS - Wound Dressings X - Small Wound Dressing one or multiple wounds 1 10 []  - 0 Medium Wound Dressing one or multiple wounds []  - 0 Large Wound Dressing one or multiple wounds []  - 0 Application of Medications - topical []  - 0 Application of Medications - injection INTERVENTIONS - Miscellaneous []  - 0 External ear exam []  - 0 Specimen Collection (cultures, biopsies, blood, body fluids, etc.) []  - 0 Specimen(s) / Culture(s) sent or taken to Lab for analysis []  - 0 Patient Transfer (multiple staff / Nurse, adult / Similar devices) []  - 0 Simple Staple / Suture removal (25 or less) []  - 0 Complex Staple / Suture removal (26 or more) []  - 0 Hypo / Hyperglycemic Management (close monitor of Blood Glucose) Morioka, Brilee ([id number]) [id number] Page 3 of 7 []  - 0 Ankle / Brachial Index (ABI) - do not check if billed separately X- 1 5 Vital Signs Has the patient been seen at the hospital within the last three years: Yes Total Score: 85 Level Of Care: New/Established - Level 3 Electronic Signature(s) Signed: 04/26/2023 4:48:32 PM By: Shawn Stall RN, BSN Entered By: Shawn Stall on 04/26/2023 12:54:15 -------------------------------------------------------------------------------- Encounter Discharge Information Details Patient Name: Date of Service: [name], [name] 04/26/2023 10:45 A M Medical Record Number: [id number] Patient  Account Number: [id number] Date of Birth/Sex: Treating RN: 01-20-1976 (47 y.o. Arta Silence Primary Care Phoenix Dresser: Dulce Sellar Other Clinician: Referring Tylicia Sherman: Treating Dennard Vezina/Extender: Milbert Coulter in Treatment: 2 Encounter Discharge Information Items Discharge Condition: Stable Ambulatory Status: Ambulatory Discharge Destination: Home Transportation: Private Auto Accompanied By: self Schedule Follow-up Appointment: Yes Clinical Summary of Care: Electronic Signature(s) Signed: 04/26/2023 4:48:32 PM By: Shawn Stall RN, BSN Entered By: Shawn Stall on 04/26/2023 12:54:51 -------------------------------------------------------------------------------- Lower Extremity Assessment Details Patient Name: Date of Service: [name], [name] 04/26/2023 10:45 A M Medical Record Number: [id number] Patient Account Number: [id number] Date of Birth/Sex: Treating RN: [birth year] (47 y.o. Arta Silence Primary Care Sheryn Aldaz: Dulce Sellar Other Clinician: Referring Clair Bardwell: Treating Wanita Derenzo/Extender: Milbert Coulter in Treatment: 2 Electronic Signature(s) Signed: 04/26/2023 4:48:32 PM By: Shawn Stall RN, BSN Entered By: Shawn Stall on 04/26/2023 11:08:16 -------------------------------------------------------------------------------- Multi-Disciplinary Care Plan Details Patient Name: Date of Service: [name], [name] 04/26/2023 10:45 A M Medical Record Number: [id number] Patient Account Number: [id number] HOGENSON, Kris ([id number]) [id number] Page 4 of 7 Date of Birth/Sex: Treating RN: Mar 10, 1976 (47 y.o. Arta Silence Primary Care Lurdes Haltiwanger: Other Clinician: Dulce Sellar Referring Shambria Camerer: Treating Eran Mistry/Extender: Milbert Coulter in Treatment: 2 Active Inactive Wound/Skin Impairment Nursing Diagnoses: Impaired tissue  integrity Goals: Patient/caregiver will verbalize understanding of skin care regimen Date Initiated: 04/12/2023 Target Resolution Date: 05/24/2023 Goal Status: Active Interventions: Assess ulceration(s) every visit Treatment Activities: Skin care regimen initiated : 04/12/2023 Notes: Electronic Signature(s) Signed: 04/26/2023 4:48:32 PM By: Shawn Stall RN, BSN Entered By: Shawn Stall on 04/26/2023 11:19:16 -------------------------------------------------------------------------------- Pain Assessment Details Patient Name: Date of Service: [name], [name] 04/26/2023 10:45 A M Medical Record Number: [id number] Patient Account Number: [id number] Date of Birth/Sex: Treating RN: 22-Nov-1975 (47 y.o. Arta Silence Primary Care Tabb Croghan: Dulce Sellar Other  Clinician: Referring Kamri Gotsch: Treating Calliope Delangel/Extender: Milbert Coulter in Treatment: 2 Active Problems Location of Pain Severity and Description of Pain Patient Has Paino No Site Locations Pain Management and Medication Current Pain ManagementRoxxanne [place], [place] ([id number]) [id number] Page 5 of 7 Electronic Signature(s) Signed: 04/26/2023 4:48:32 PM By: Shawn Stall RN, BSN Entered By: Shawn Stall on 04/26/2023 11:08:11 -------------------------------------------------------------------------------- Patient/Caregiver Education Details Patient Name: Date of Service: [name] 7/3/2024andnbsp10:45 A M Medical Record Number: [id number] Patient Account Number: [id number] Date of Birth/Gender: Treating RN: 1976/03/24 (47 y.o. Arta Silence Primary Care Physician: Dulce Sellar Other Clinician: Referring Physician: Treating Physician/Extender: Milbert Coulter in Treatment: 2 Education Assessment Education Provided To: Patient Education Topics Provided Wound/Skin Impairment: Handouts: Caring for Your Ulcer Methods:  Explain/Verbal Responses: Reinforcements needed Electronic Signature(s) Signed: 04/26/2023 4:48:32 PM By: Shawn Stall RN, BSN Entered By: Shawn Stall on 04/26/2023 11:19:28 -------------------------------------------------------------------------------- Wound Assessment Details Patient Name: Date of Service: [name], [name] 04/26/2023 10:45 A M Medical Record Number: [id number] Patient Account Number: [id number] Date of Birth/Sex: Treating RN: 04-14-1976 (47 y.o. Arta Silence Primary Care Ashleymarie Granderson: Dulce Sellar Other Clinician: Referring Mauri Tolen: Treating Emmagene Ortner/Extender: Riki Rusk Weeks in Treatment: 2 Wound Status Wound Number: 1 Primary Etiology: Abscess Wound Location: Umbilicus Wound Status: Open Wounding Event: Surgical Injury Comorbid History: Hypertension Date Acquired: 02/17/2023 Weeks Of Treatment: 2 Clustered Wound: No Photos Parrack, Graceanne ([id number]) [id number] Page 6 of 7 Wound Measurements Length: (cm) 0.4 Width: (cm) 0.4 Depth: (cm) 3.3 Area: (cm) 0.126 Volume: (cm) 0.415 % Reduction in Area: 55.5% % Reduction in Volume: 58.1% Epithelialization: Small (1-33%) Tunneling: No Undermining: No Wound Description Classification: Full Thickness Without Exposed Support Structures Wound Margin: Distinct, outline attached Exudate Amount: Medium Exudate Type: Serosanguineous Exudate Color: red, brown Foul Odor After Cleansing: No Slough/Fibrino Yes Wound Bed Granulation Amount: Large (67-100%) Exposed Structure Granulation Quality: Red Fascia Exposed: No Necrotic Amount: None Present (0%) Fat Layer (Subcutaneous Tissue) Exposed: Yes Tendon Exposed: No Muscle Exposed: No Joint Exposed: No Bone Exposed: No Periwound Skin Texture Texture Color No Abnormalities Noted: No No Abnormalities Noted: No Callus: No Atrophie Blanche: No Crepitus: No Cyanosis: No Excoriation: No Ecchymosis:  No Induration: No Erythema: No Rash: No Hemosiderin Staining: No Scarring: No Mottled: No Pallor: No Moisture Rubor: No No Abnormalities Noted: Yes Temperature / Pain Temperature: No Abnormality Treatment Notes Wound #1 (Umbilicus) Cleanser Soap and Water Discharge Instruction: May shower and wash wound with dial antibacterial soap and water prior to dressing change. Vashe 5.8 (oz) Discharge Instruction: Cleanse the wound with Vashe prior to applying a clean dressing using gauze sponges, not tissue or cotton balls. Peri-Wound Care Topical Primary Dressing Hydrofera Blue Classic Foam Rope Dressing, 9x6 (mm/in) Discharge Instruction: Moisten with saline after packing into the belly button Secondary Dressing Woven Gauze Sponge, Non-Sterile 4x4 in Discharge Instruction: Apply over primary dressing as directed. Secured With [employer], 2x10 (in/yd) Truett, Darius ([id number]) [id number] Page 7 of 7 Discharge Instruction: Secure dressing with tape as directed. Compression Wrap Compression Stockings Add-Ons Electronic Signature(s) Signed: 04/26/2023 4:48:32 PM By: Shawn Stall RN, BSN Entered By: Shawn Stall on 04/26/2023 11:28:13 -------------------------------------------------------------------------------- Vitals Details Patient Name: Date of Service: [name], [name] 04/26/2023 10:45 A M Medical Record Number: [id number] Patient Account Number: [id number] Date of Birth/Sex: Treating RN: Feb 19, 1976 (47 y.o. Arta Silence Primary Care Ariauna Farabee: Dulce Sellar Other Clinician: Referring Kianna Billet: Treating Joanthony Hamza/Extender: Milbert Coulter in  Treatment: 2 Vital Signs Time Taken: 11:07 Temperature (F): 98.2 Pulse (bpm): 96 Respiratory Rate (breaths/min): 20 Blood Pressure (mmHg): 163/106 Reference Range: 80 - 120 mg / dl Electronic Signature(s) Signed: 04/26/2023 4:48:32 PM By: Shawn Stall RN,  BSN Entered By: Shawn Stall on 04/26/2023 11:08:07

## 2023-05-03 ENCOUNTER — Encounter (HOSPITAL_BASED_OUTPATIENT_CLINIC_OR_DEPARTMENT_OTHER): Payer: 59 | Admitting: Physician Assistant

## 2023-05-03 DIAGNOSIS — I10 Essential (primary) hypertension: Secondary | ICD-10-CM | POA: Diagnosis not present

## 2023-05-03 DIAGNOSIS — L98492 Non-pressure chronic ulcer of skin of other sites with fat layer exposed: Secondary | ICD-10-CM | POA: Diagnosis not present

## 2023-05-03 NOTE — Progress Notes (Addendum)
FOODY, Montez ([id number]) [id number] Page 1 of 6 Visit Report for 05/03/2023 Chief Complaint Document Details Patient Name: Date of Service: Haley Wyatt 05/03/2023 11:00 A M Medical Record Number: [id number] Patient Account Number: [id number] Date of Birth/Sex: Treating RN: 31-Oct-1975 (47 y.o. F) Primary Care Provider: Dulce Sellar Other Clinician: Referring Provider: Treating Provider/Extender: Milbert Coulter in Treatment: 3 Information Obtained from: Patient Chief Complaint Umbilical surgical ulcer s/p abdominoplasty Electronic Signature(s) Signed: 05/03/2023 11:02:57 AM By: Allen Derry PA-C Entered By: Allen Derry on 05/03/2023 11:02:56 -------------------------------------------------------------------------------- HPI Details Patient Name: Date of Service: Haley Wyatt, [name] 05/03/2023 11:00 A M Medical Record Number: [id number] Patient Account Number: [id number] Date of Birth/Sex: Treating RN: [birth year] (47 y.o. F) Primary Care Provider: Dulce Sellar Other Clinician: Referring Provider: Treating Provider/Extender: Milbert Coulter in Treatment: 3 History of Present Illness HPI Description: 04-12-2023 upon evaluation today patient appears to be doing poorly today with regard to her wound. She subsequently has an umbilical ulcer which is actually secondary to surgery. She had a extensive [place] and subsequently they did an extremely good job with that which is awesome. With that being said she unfortunately has several sutures that are actually embedded in the umbilicus region which she was not able to get anyone to take out at her previous clinic where she has been being seen. With that being said other than the swollen area everything else has been healing nicely. Nonetheless I do believe this is actually somewhat deep-rooted beyond where just  the sutures need to be removed to be honest. 04-26-2023 upon evaluation today patient appears to be doing well currently in regard to her umbilical ulcer. This is a surgical wound and does seem to be doing much better. Fortunately I do not see any evidence of active infection locally nor systemically which is great news. No fevers, chills, nausea, vomiting, or diarrhea. 05-03-2023 upon evaluation patient appears to be doing excellent in regard to her wound which is actually measuring significantly smaller compared to even last week's evaluation. In fact I think the majority of the sidewalls are actually healed there is just a very tiny area in the base of the wound that might still be open. Nonetheless she wants to use the plug for the umbilical area in order to ensure that the umbilical region does not completely closed up and heals internally. For that reason I believe that she could do this at this point but I would want to use a little bit of mupirocin on the plug that she is make sure to clean it well between times that she puts this in. She voiced understanding. Electronic Signature(s) Signed: 05/03/2023 1:41:46 PM By: Allen Derry PA-C Entered By: Allen Derry on 05/03/2023 13:41:46 Haley Wyatt, Haley Wyatt ([id number]) [id number] Page 2 of 6 -------------------------------------------------------------------------------- Physical Exam Details Patient Name: Date of Service: Haley Wyatt 05/03/2023 11:00 A M Medical Record Number: [id number] Patient Account Number: [id number] Date of Birth/Sex: Treating RN: [birth year] (47 y.o. F) Primary Care Provider: Dulce Sellar Other Clinician: Referring Provider: Treating Provider/Extender: Milbert Coulter in Treatment: 3 Constitutional Well-nourished and well-hydrated in no acute distress. Respiratory normal breathing without difficulty. Psychiatric this patient is able to make decisions and  demonstrates good insight into disease process. Alert and Oriented x 3. pleasant and cooperative. Notes Upon inspection patient's wound bed actually showed signs of healing nicely I feel like she is actually making excellent progress  here I do not see any signs of active infection locally nor systemically which is great news. In fact I think this is pretty much healed on the sidewalls or may just be a little bit right in the base of the wound that open based on some of the drainage I am seeing. Electronic Signature(s) Signed: 05/03/2023 1:42:06 PM By: Allen Derry PA-C Entered By: Allen Derry on 05/03/2023 13:42:06 -------------------------------------------------------------------------------- Physician Orders Details Patient Name: Date of Service: Haley Wyatt, [name] 05/03/2023 11:00 A M Medical Record Number: [id number] Patient Account Number: [id number] Date of Birth/Sex: Treating RN: [birth year] (47 y.o. Arta Silence Primary Care Provider: Dulce Sellar Other Clinician: Referring Provider: Treating Provider/Extender: Milbert Coulter in Treatment: 3 Verbal / Phone Orders: No Diagnosis Coding ICD-10 Coding Code Description T81.31XA Disruption of external operation (surgical) wound, not elsewhere classified, initial encounter L98.492 Non-pressure chronic ulcer of skin of other sites with fat layer exposed Follow-up Appointments ppointment in 1 week. Allen Derry III PA Room 9 Wednesday 05/03/2023 1230 room 7 Return A Other: - bactroban ointment pick up at your pharmacy. thin coat of ointment on the belly plug and insert. Today in clinic will apply hydrofera blue with bordered foam then once home remove and apply the ointment with belly plug. Anesthetic (In clinic) Topical Lidocaine 4% applied to wound bed Bathing/ Shower/ Hygiene Other Bathing/Shower/Hygiene Orders/Instructions: - May bathe and change dressing (wound) after bathing. Wound  Treatment Wound #1 - Umbilicus Cleanser: Soap and Water 1 x Per Day/30 Days Discharge Instructions: May shower and wash wound with dial antibacterial soap and water prior to dressing change. Haley Wyatt, Haley Wyatt ([id number]) [id number] Page 3 of 6 Cleanser: Vashe 5.8 (oz) (Generic) 1 x Per Day/30 Days Discharge Instructions: Cleanse the wound with Vashe prior to applying a clean dressing using gauze sponges, not tissue or cotton balls. Topical: Mupirocin Ointment 1 x Per Day/30 Days Discharge Instructions: Apply Mupirocin (Bactroban) Secondary Dressing: belly plug with bactroban 1 x Per Day/30 Days Discharge Instructions: insert the wound area. Patient Medications llergies: No Known Drug Allergies A Notifications Medication Indication Start End 05/03/2023 mupirocin DOSE topical 2 % ointment - ointment topical once daily to the wound location when putting in the umbilical plug x 30 days Electronic Signature(s) Signed: 05/03/2023 1:43:36 PM By: Allen Derry PA-C Entered By: Allen Derry on 05/03/2023 13:43:35 -------------------------------------------------------------------------------- Problem List Details Patient Name: Date of Service: Haley Wyatt, [name] 05/03/2023 11:00 A M Medical Record Number: [id number] Patient Account Number: [id number] Date of Birth/Sex: Treating RN: [birth year] (47 y.o. F) Primary Care Provider: Dulce Sellar Other Clinician: Referring Provider: Treating Provider/Extender: Milbert Coulter in Treatment: 3 Active Problems ICD-10 Encounter Code Description Active Date MDM Diagnosis T81.31XA Disruption of external operation (surgical) wound, not elsewhere classified, 04/12/2023 No Yes initial encounter L98.492 Non-pressure chronic ulcer of skin of other sites with fat layer exposed 04/12/2023 No Yes Inactive Problems Resolved Problems Electronic Signature(s) Signed: 05/03/2023 11:02:47 AM By: Allen Derry  PA-C Entered By: Allen Derry on 05/03/2023 11:02:47 Progress Note Details -------------------------------------------------------------------------------- Haley Wyatt ([id number]) [id number] Page 4 of 6 Patient Name: Date of Service: Haley Wyatt 05/03/2023 11:00 A M Medical Record Number: [id number] Patient Account Number: [id number] Date of Birth/Sex: Treating RN: 01-May-1976 (47 y.o. F) Primary Care Provider: Dulce Sellar Other Clinician: Referring Provider: Treating Provider/Extender: Milbert Coulter in Treatment: 3 Subjective Chief Complaint Information obtained from Patient Umbilical surgical ulcer s/p abdominoplasty History of Present Illness (  HPI) 04-12-2023 upon evaluation today patient appears to be doing poorly today with regard to her wound. She subsequently has an umbilical ulcer which is actually secondary to surgery. She had a extensive tummy tuck which was actually performed abroad and subsequently they did an extremely good job with that which is awesome. With that being said she unfortunately has several sutures that are actually embedded in the umbilicus region which she was not able to get anyone to take out at her previous clinic where she has been being seen. With that being said other than the swollen area everything else has been healing nicely. Nonetheless I do believe this is actually somewhat deep-rooted beyond where just the sutures need to be removed to be honest. 04-26-2023 upon evaluation today patient appears to be doing well currently in regard to her umbilical ulcer. This is a surgical wound and does seem to be doing much better. Fortunately I do not see any evidence of active infection locally nor systemically which is great news. No fevers, chills, nausea, vomiting, or diarrhea. 05-03-2023 upon evaluation patient appears to be doing excellent in regard to her wound which is actually measuring  significantly smaller compared to even last week's evaluation. In fact I think the majority of the sidewalls are actually healed there is just a very tiny area in the base of the wound that might still be open. Nonetheless she wants to use the plug for the umbilical area in order to ensure that the umbilical region does not completely closed up and heals internally. For that reason I believe that she could do this at this point but I would want to use a little bit of mupirocin on the plug that she is make sure to clean it well between times that she puts this in. She voiced understanding. Objective Constitutional Well-nourished and well-hydrated in no acute distress. Vitals Time Taken: 11:45 AM, Pulse: 108 bpm, Respiratory Rate: 18 breaths/min, Blood Pressure: 137/90 mmHg. Respiratory normal breathing without difficulty. Psychiatric this patient is able to make decisions and demonstrates good insight into disease process. Alert and Oriented x 3. pleasant and cooperative. General Notes: Upon inspection patient's wound bed actually showed signs of healing nicely I feel like she is actually making excellent progress here I do not see any signs of active infection locally nor systemically which is great news. In fact I think this is pretty much healed on the sidewalls or may just be a little bit right in the base of the wound that open based on some of the drainage I am seeing. Integumentary (Hair, Skin) Wound #1 status is Open. Original cause of wound was Surgical Injury. The date acquired was: 02/17/2023. The wound has been in treatment 3 weeks. The wound is located on the Umbilicus. The wound measures 0.3cm length x 0.2cm width x 2.3cm depth; 0.047cm^2 area and 0.108cm^3 volume. There is Fat Layer (Subcutaneous Tissue) exposed. There is no tunneling or undermining noted. There is a medium amount of serosanguineous drainage noted. The wound margin is distinct with the outline attached to the wound  base. There is large (67-100%) red granulation within the wound bed. There is no necrotic tissue within the wound bed. The periwound skin appearance had no abnormalities noted for moisture. The periwound skin appearance did not exhibit: Callus, Crepitus, Excoriation, Induration, Rash, Scarring, Atrophie Blanche, Cyanosis, Ecchymosis, Hemosiderin Staining, Mottled, Pallor, Rubor, Erythema. Periwound temperature was noted as No Abnormality. Assessment Active Problems ICD-10 Disruption of external operation (surgical) wound, not elsewhere classified,  initial encounter Non-pressure chronic ulcer of skin of other sites with fat layer exposed Plan Follow-up Appointments: Haley Wyatt, Haley Wyatt ([id number]) [id number] Page 5 of 6 Return Appointment in 1 week. Allen Derry III PA Room 9 Wednesday 05/03/2023 1230 room 7 Other: - bactroban ointment pick up at your pharmacy. thin coat of ointment on the belly plug and insert. Today in clinic will apply hydrofera blue with bordered foam then once home remove and apply the ointment with belly plug. Anesthetic: (In clinic) Topical Lidocaine 4% applied to wound bed Bathing/ Shower/ Hygiene: Other Bathing/Shower/Hygiene Orders/Instructions: - May bathe and change dressing (wound) after bathing. The following medication(s) was prescribed: mupirocin topical 2 % ointment ointment topical once daily to the wound location when putting in the umbilical plug x 30 days starting 05/03/2023 WOUND #1: - Umbilicus Wound Laterality: Cleanser: Soap and Water 1 x Per Day/30 Days Discharge Instructions: May shower and wash wound with dial antibacterial soap and water prior to dressing change. Cleanser: Vashe 5.8 (oz) (Generic) 1 x Per Day/30 Days Discharge Instructions: Cleanse the wound with Vashe prior to applying a clean dressing using gauze sponges, not tissue or cotton balls. Topical: Mupirocin Ointment 1 x Per Day/30 Days Discharge Instructions:  Apply Mupirocin (Bactroban) Secondary Dressing: belly plug with bactroban 1 x Per Day/30 Days Discharge Instructions: insert the wound area. 1. Would recommend currently that we have the patient continue to monitor for any evidence of infection or worsening. Based on what I am seeing I do believe that she is doing very well. She does want to use the umbilical plug in order to maintain the umbilical opening so it does not completely seal off and look abnormal following the plastic surgery. I think she can do this on undergoing send in a prescription for the mupirocin ointment which I think would be what she should use in order to prevent any infection I think this is a bit of utmost importance. She should cut the plug before appointment and each day. In between she should stored in Vashe and clean it well before reapplication. 2. I am good recommend that she follow-up next week to see where things stand on hopeful that she may be completely healed by that time and if so then we can discharge her at that point. She is in agreement with that plan. We will see patient back for reevaluation in 1 week here in the clinic. If anything worsens or changes patient will contact our office for additional recommendations. Electronic Signature(s) Signed: 05/03/2023 1:43:43 PM By: Allen Derry PA-C Entered By: Allen Derry on 05/03/2023 13:43:42 -------------------------------------------------------------------------------- SuperBill Details Patient Name: Date of Service: Haley Wyatt, [name] 05/03/2023 Medical Record Number: [id number] Patient Account Number: [id number] Date of Birth/Sex: Treating RN: 16-Jul-1976 (47 y.o. Arta Silence Primary Care Provider: Dulce Sellar Other Clinician: Referring Provider: Treating Provider/Extender: Milbert Coulter in Treatment: 3 Diagnosis Coding ICD-10 Codes Code Description T81.31XA Disruption of external operation (surgical) wound, not  elsewhere classified, initial encounter L98.492 Non-pressure chronic ulcer of skin of other sites with fat layer exposed Facility Procedures : CPT4 Code: [id number] Description: 99213 - WOUND CARE VISIT-LEV 3 EST PT Modifier: Quantity: 1 Physician Procedures : CPT4 Code Description Modifier [id number] - WC PHYS LEVEL 4 - EST PT ICD-10 Diagnosis Description T81.31XA Disruption of external operation (surgical) wound, not elsewhere classified, initial encounter L98.492 Non-pressure chronic ulcer of skin of  other sites with fat layer exposed Quantity: 1 Electronic Signature(s) Signed: 05/03/2023 1:43:51 PM  By: Prentice Docker, [place] [phone] By: Dionne Ano [phone].pdf Page 6 of 6 Signed: 05/03/2023 1:43:51 Entered By: Allen Derry on 05/03/2023 13:43:51

## 2023-05-05 NOTE — Progress Notes (Signed)
WESOLOWSKI, Shakirah ([id number]) [id number] Page 1 of 7 Visit Report for 05/03/2023 Arrival Information Details Patient Name: Date of Service: Haley Wyatt 05/03/2023 11:00 A M Medical Record Number: [id number] Patient Account Number: [id number] Date of Birth/Sex: Treating RN: April 04, 1976 (47 y.o. F) Primary Care Wilberta Dorvil: Dulce Sellar Other Clinician: Referring Fadia Marlar: Treating Mohmmad Saleeby/Extender: Milbert Coulter in Treatment: 3 Visit Information History Since Last Visit Added or deleted any medications: No Patient Arrived: Ambulatory Any new allergies or adverse reactions: No Arrival Time: 11:37 Had a fall or experienced change in No Accompanied By: self activities of daily living that may affect Transfer Assistance: None risk of falls: Patient Identification Verified: Yes Signs or symptoms of abuse/neglect since last visito No Secondary Verification Process Completed: Yes Hospitalized since last visit: No Patient Requires Transmission-Based Precautions: No Implantable device outside of the clinic excluding No Patient Has Alerts: No cellular tissue based products placed in the center since last visit: Has Dressing in Place as Prescribed: Yes Pain Present Now: No Electronic Signature(s) Signed: 05/05/2023 1:10:34 PM By: Thayer Dallas Entered By: Thayer Dallas on 05/03/2023 11:44:13 -------------------------------------------------------------------------------- Clinic Level of Care Assessment Details Patient Name: Date of Service: Haley Wyatt 05/03/2023 11:00 A M Medical Record Number: [id number] Patient Account Number: [id number] Date of Birth/Sex: Treating RN: 10-22-1976 (47 y.o. [name], Millard.Loa Primary Care Welden Hausmann: Dulce Sellar Other Clinician: Referring Rashanna Christiana: Treating Trampus Mcquerry/Extender: Milbert Coulter in Treatment: 3 Clinic Level of Care Assessment Items TOOL 4  Quantity Score X- 1 0 Use when only an EandM is performed on FOLLOW-UP visit ASSESSMENTS - Nursing Assessment / Reassessment X- 1 10 Reassessment of Co-morbidities (includes updates in patient status) X- 1 5 Reassessment of Adherence to Treatment Plan ASSESSMENTS - Wound and Skin A ssessment / Reassessment X - Simple Wound Assessment / Reassessment - one wound 1 5 []  - 0 Complex Wound Assessment / Reassessment - multiple wounds []  - 0 Dermatologic / Skin Assessment (not related to wound area) ASSESSMENTS - Focused Assessment []  - 0 Circumferential Edema Measurements - multi extremities []  - 0 Nutritional Assessment / Counseling / Intervention Blackwell, Neoma ([id number]) [id number] Page 2 of 7 []  - 0 Lower Extremity Assessment (monofilament, tuning fork, pulses) []  - 0 Peripheral Arterial Disease Assessment (using hand held doppler) ASSESSMENTS - Ostomy and/or Continence Assessment and Care []  - 0 Incontinence Assessment and Management []  - 0 Ostomy Care Assessment and Management (repouching, etc.) PROCESS - Coordination of Care X - Simple Patient / Family Education for ongoing care 1 15 []  - 0 Complex (extensive) Patient / Family Education for ongoing care X- 1 10 Staff obtains Chiropractor, Records, T Results / Process Orders est []  - 0 Staff telephones HHA, Nursing Homes / Clarify orders / etc []  - 0 Routine Transfer to another Facility (non-emergent condition) []  - 0 Routine Hospital Admission (non-emergent condition) []  - 0 New Admissions / Manufacturing engineer / Ordering NPWT Apligraf, etc. , []  - 0 Emergency Hospital Admission (emergent condition) X- 1 10 Simple Discharge Coordination []  - 0 Complex (extensive) Discharge Coordination PROCESS - Special Needs []  - 0 Pediatric / Minor Patient Management []  - 0 Isolation Patient Management []  - 0 Hearing / Language / Visual special needs []  - 0 Assessment of Community assistance  (transportation, D/C planning, etc.) []  - 0 Additional assistance / Altered mentation []  - 0 Support Surface(s) Assessment (bed, cushion, seat, etc.) INTERVENTIONS - Wound Cleansing / Measurement X - Simple Wound Cleansing - one  wound 1 5 []  - 0 Complex Wound Cleansing - multiple wounds X- 1 5 Wound Imaging (photographs - any number of wounds) []  - 0 Wound Tracing (instead of photographs) X- 1 5 Simple Wound Measurement - one wound []  - 0 Complex Wound Measurement - multiple wounds INTERVENTIONS - Wound Dressings X - Small Wound Dressing one or multiple wounds 1 10 []  - 0 Medium Wound Dressing one or multiple wounds []  - 0 Large Wound Dressing one or multiple wounds []  - 0 Application of Medications - topical []  - 0 Application of Medications - injection INTERVENTIONS - Miscellaneous []  - 0 External ear exam []  - 0 Specimen Collection (cultures, biopsies, blood, body fluids, etc.) []  - 0 Specimen(s) / Culture(s) sent or taken to Lab for analysis []  - 0 Patient Transfer (multiple staff / Nurse, adult / Similar devices) []  - 0 Simple Staple / Suture removal (25 or less) []  - 0 Complex Staple / Suture removal (26 or more) []  - 0 Hypo / Hyperglycemic Management (close monitor of Blood Glucose) Mote, Canary ([id number]) [id number] Page 3 of 7 []  - 0 Ankle / Brachial Index (ABI) - do not check if billed separately X- 1 5 Vital Signs Has the patient been seen at the hospital within the last three years: Yes Total Score: 85 Level Of Care: New/Established - Level 3 Electronic Signature(s) Signed: 05/03/2023 6:00:37 PM By: Shawn Stall RN, BSN Entered By: Shawn Stall on 05/03/2023 12:03:56 -------------------------------------------------------------------------------- Encounter Discharge Information Details Patient Name: Date of Service: [name], [name] 05/03/2023 11:00 A M Medical Record Number: [id number] Patient Account Number:  [id number] Date of Birth/Sex: Treating RN: 10-02-1976 (47 y.o. Arta Silence Primary Care Dion Parrow: Dulce Sellar Other Clinician: Referring Yasamin Karel: Treating Christoher Drudge/Extender: Milbert Coulter in Treatment: 3 Encounter Discharge Information Items Discharge Condition: Stable Ambulatory Status: Ambulatory Discharge Destination: Home Transportation: Private Auto Accompanied By: self Schedule Follow-up Appointment: Yes Clinical Summary of Care: Electronic Signature(s) Signed: 05/03/2023 6:00:37 PM By: Shawn Stall RN, BSN Entered By: Shawn Stall on 05/03/2023 12:04:34 -------------------------------------------------------------------------------- Lower Extremity Assessment Details Patient Name: Date of Service: [name], [name] 05/03/2023 11:00 A M Medical Record Number: [id number] Patient Account Number: [id number] Date of Birth/Sex: Treating RN: 1976/08/05 (47 y.o. F) Primary Care Langdon Crosson: Dulce Sellar Other Clinician: Referring Ronnell Clinger: Treating Graclynn Vanantwerp/Extender: Milbert Coulter in Treatment: 3 Electronic Signature(s) Signed: 05/05/2023 1:10:34 PM By: Thayer Dallas Entered By: Thayer Dallas on 05/03/2023 11:46:25 -------------------------------------------------------------------------------- Multi-Disciplinary Care Plan Details Patient Name: Date of Service: [name], [name] 05/03/2023 11:00 A M Medical Record Number: [id number] Patient Account Number: [id number] PENMAN, Xara ([id number]) [id number] Page 4 of 7 Date of Birth/Sex: Treating RN: Sep 14, 1976 (47 y.o. Arta Silence Primary Care Raiyah Speakman: Other Clinician: Dulce Sellar Referring Neiman Roots: Treating Marlea Gambill/Extender: Milbert Coulter in Treatment: 3 Active Inactive Wound/Skin Impairment Nursing Diagnoses: Impaired tissue integrity Goals: Patient/caregiver will verbalize  understanding of skin care regimen Date Initiated: 04/12/2023 Target Resolution Date: 05/24/2023 Goal Status: Active Interventions: Assess ulceration(s) every visit Treatment Activities: Skin care regimen initiated : 04/12/2023 Notes: Electronic Signature(s) Signed: 05/03/2023 6:00:37 PM By: Shawn Stall RN, BSN Entered By: Shawn Stall on 05/03/2023 12:03:11 -------------------------------------------------------------------------------- Pain Assessment Details Patient Name: Date of Service: [name], [name] 05/03/2023 11:00 A M Medical Record Number: [id number] Patient Account Number: [id number] Date of Birth/Sex: Treating RN: [birth year] (47 y.o. F) Primary Care Mercy Malena: Dulce Sellar Other Clinician: Referring Redina Zeller: Treating Eldana Isip/Extender: Riki Rusk  Weeks in Treatment: 3 Active Problems Location of Pain Severity and Description of Pain Patient Has Paino No Site Locations Pain Management and Medication Current Pain ManagementAlaza Golson, Dyasia ([id number]) [id number] Page 5 of 7 Electronic Signature(s) Signed: 05/05/2023 1:10:34 PM By: Thayer Dallas Entered By: Thayer Dallas on 05/03/2023 11:46:17 -------------------------------------------------------------------------------- Patient/Caregiver Education Details Patient Name: Date of Service: [name], [name] 7/10/2024andnbsp11:00 A M Medical Record Number: [id number] Patient Account Number: [id number] Date of Birth/Gender: Treating RN: 11-06-1975 (47 y.o. Arta Silence Primary Care Physician: Dulce Sellar Other Clinician: Referring Physician: Treating Physician/Extender: Milbert Coulter in Treatment: 3 Education Assessment Education Provided To: Patient Education Topics Provided Wound/Skin Impairment: Handouts: Caring for Your Ulcer Methods: Explain/Verbal Responses: Reinforcements needed Electronic  Signature(s) Signed: 05/03/2023 6:00:37 PM By: Shawn Stall RN, BSN Entered By: Shawn Stall on 05/03/2023 12:03:25 -------------------------------------------------------------------------------- Wound Assessment Details Patient Name: Date of Service: [name], [name] 05/03/2023 11:00 A M Medical Record Number: [id number] Patient Account Number: [id number] Date of Birth/Sex: Treating RN: 1976/07/04 (47 y.o. F) Primary Care Jobanny Mavis: Dulce Sellar Other Clinician: Referring Jaelin Fackler: Treating Tekia Waterbury/Extender: Riki Rusk Weeks in Treatment: 3 Wound Status Wound Number: 1 Primary Etiology: Abscess Wound Location: Umbilicus Wound Status: Open Wounding Event: Surgical Injury Comorbid History: Hypertension Date Acquired: 02/17/2023 Weeks Of Treatment: 3 Clustered Wound: No Photos Pasha, Geriann ([id number]) [id number] Page 6 of 7 Wound Measurements Length: (cm) 0.3 Width: (cm) 0.2 Depth: (cm) 2.3 Area: (cm) 0.047 Volume: (cm) 0.108 % Reduction in Area: 83.4% % Reduction in Volume: 89.1% Epithelialization: Small (1-33%) Tunneling: No Undermining: No Wound Description Classification: Full Thickness Without Exposed Support Structures Wound Margin: Distinct, outline attached Exudate Amount: Medium Exudate Type: Serosanguineous Exudate Color: red, brown Foul Odor After Cleansing: No Slough/Fibrino Yes Wound Bed Granulation Amount: Large (67-100%) Exposed Structure Granulation Quality: Red Fascia Exposed: No Necrotic Amount: None Present (0%) Fat Layer (Subcutaneous Tissue) Exposed: Yes Tendon Exposed: No Muscle Exposed: No Joint Exposed: No Bone Exposed: No Periwound Skin Texture Texture Color No Abnormalities Noted: No No Abnormalities Noted: No Callus: No Atrophie Blanche: No Crepitus: No Cyanosis: No Excoriation: No Ecchymosis: No Induration: No Erythema: No Rash: No Hemosiderin Staining:  No Scarring: No Mottled: No Pallor: No Moisture Rubor: No No Abnormalities Noted: Yes Temperature / Pain Temperature: No Abnormality Treatment Notes Wound #1 (Umbilicus) Cleanser Soap and Water Discharge Instruction: May shower and wash wound with dial antibacterial soap and water prior to dressing change. Vashe 5.8 (oz) Discharge Instruction: Cleanse the wound with Vashe prior to applying a clean dressing using gauze sponges, not tissue or cotton balls. Peri-Wound Care Topical Mupirocin Ointment Discharge Instruction: Apply Mupirocin (Bactroban) Primary Dressing Secondary Dressing Secured With Compression Wrap Compression Stockings Topstone, Shylynn ([id number]) [id number] Page 7 of 7 Add-Ons Electronic Signature(s) Signed: 05/05/2023 1:10:34 PM By: Thayer Dallas Entered By: Thayer Dallas on 05/03/2023 11:54:02 -------------------------------------------------------------------------------- Vitals Details Patient Name: Date of Service: [name], [name] 05/03/2023 11:00 A M Medical Record Number: [id number] Patient Account Number: [id number] Date of Birth/Sex: Treating RN: 1975-12-19 (47 y.o. F) Primary Care Allahna Husband: Dulce Sellar Other Clinician: Referring Yazmen Briones: Treating Deijah Spikes/Extender: Milbert Coulter in Treatment: 3 Vital Signs Time Taken: 11:45 Pulse (bpm): 108 Respiratory Rate (breaths/min): 18 Blood Pressure (mmHg): 137/90 Reference Range: 80 - 120 mg / dl Electronic Signature(s) Signed: 05/05/2023 1:10:34 PM By: Thayer Dallas Entered By: Thayer Dallas on 05/03/2023 11:46:10

## 2023-05-10 ENCOUNTER — Encounter (HOSPITAL_BASED_OUTPATIENT_CLINIC_OR_DEPARTMENT_OTHER): Payer: 59 | Admitting: Physician Assistant

## 2023-05-10 DIAGNOSIS — L98492 Non-pressure chronic ulcer of skin of other sites with fat layer exposed: Secondary | ICD-10-CM | POA: Diagnosis not present

## 2023-05-10 DIAGNOSIS — I10 Essential (primary) hypertension: Secondary | ICD-10-CM | POA: Diagnosis not present

## 2023-05-10 DIAGNOSIS — T8131XA Disruption of external operation (surgical) wound, not elsewhere classified, initial encounter: Secondary | ICD-10-CM | POA: Diagnosis not present

## 2023-05-10 NOTE — Progress Notes (Addendum)
TOPP, Mahlet ([id number]) [id number] Page 1 of 5 Visit Report for 05/10/2023 Chief Complaint Document Details Patient Name: Date of Service: Haley Wyatt 05/10/2023 12:30 PM Medical Record Number: [id number] Patient Account Number: [id number] Date of Birth/Sex: Treating RN: Oct 21, 1976 (47 y.o. F) Primary Care Provider: Dulce Sellar Other Clinician: Referring Provider: Treating Provider/Extender: Milbert Coulter in Treatment: 4 Information Obtained from: Patient Chief Complaint Umbilical surgical ulcer s/p abdominoplasty Electronic Signature(s) Signed: 05/10/2023 12:47:15 PM By: Allen Derry PA-C Entered By: Allen Derry on 05/10/2023 12:47:15 -------------------------------------------------------------------------------- HPI Details Patient Name: Date of Service: Haley Wyatt, [name] 05/10/2023 12:30 PM Medical Record Number: [id number] Patient Account Number: [id number] Date of Birth/Sex: Treating RN: Mar 22, 1976 (47 y.o. F) Primary Care Provider: Dulce Sellar Other Clinician: Referring Provider: Treating Provider/Extender: Milbert Coulter in Treatment: 4 History of Present Illness HPI Description: 04-12-2023 upon evaluation today patient appears to be doing poorly today with regard to her wound. She subsequently has an umbilical ulcer which is actually secondary to surgery. She had a extensive [place] and subsequently they did an extremely good job with that which is awesome. With that being said she unfortunately has several sutures that are actually embedded in the umbilicus region which she was not able to get anyone to take out at her previous clinic where she has been being seen. With that being said other than the swollen area everything else has been healing nicely. Nonetheless I do believe this is actually somewhat deep-rooted beyond where just  the sutures need to be removed to be honest. 04-26-2023 upon evaluation today patient appears to be doing well currently in regard to her umbilical ulcer. This is a surgical wound and does seem to be doing much better. Fortunately I do not see any evidence of active infection locally nor systemically which is great news. No fevers, chills, nausea, vomiting, or diarrhea. 05-03-2023 upon evaluation patient appears to be doing excellent in regard to her wound which is actually measuring significantly smaller compared to even last week's evaluation. In fact I think the majority of the sidewalls are actually healed there is just a very tiny area in the base of the wound that might still be open. Nonetheless she wants to use the plug for the umbilical area in order to ensure that the umbilical region does not completely closed up and heals internally. For that reason I believe that she could do this at this point but I would want to use a little bit of mupirocin on the plug that she is make sure to clean it well between times that she puts this in. She voiced understanding. 05-10-2023 upon evaluation today patient appears to be doing well currently in regard to her wound which actually appears to be completely healed. I am extremely happy for her knows she is extremely happy as well. Electronic Signature(s) Signed: 05/10/2023 1:35:24 PM By: Allen Derry PA-C Entered By: Allen Derry on 05/10/2023 13:35:24 Starkman, Katerin ([id number]) [id number] Page 2 of 5 -------------------------------------------------------------------------------- Physical Exam Details Patient Name: Date of Service: Haley Wyatt 05/10/2023 12:30 PM Medical Record Number: [id number] Patient Account Number: [id number] Date of Birth/Sex: Treating RN: [birth year] (47 y.o. F) Primary Care Provider: Dulce Sellar Other Clinician: Referring Provider: Treating Provider/Extender: Milbert Coulter in Treatment: 4 Constitutional Well-nourished and well-hydrated in no acute distress. Respiratory normal breathing without difficulty. Psychiatric this patient is able to make decisions and demonstrates good  insight into disease process. Alert and Oriented x 3. pleasant and cooperative. Notes Upon inspection patient's wound bed actually showed signs of good granulation and epithelization at this point. I do not see anything open I think she has healed and looks excellent. Overall I am extremely happy with where we stand she is going to use her spacer for the umbilical area and now that it is closed I think this is appropriate and will keep things looking more natural. Electronic Signature(s) Signed: 05/10/2023 1:35:46 PM By: Allen Derry PA-C Entered By: Allen Derry on 05/10/2023 13:35:46 -------------------------------------------------------------------------------- Physician Orders Details Patient Name: Date of Service: Haley Wyatt, [name] 05/10/2023 12:30 PM Medical Record Number: [id number] Patient Account Number: [id number] Date of Birth/Sex: Treating RN: 09-Jun-1976 (47 y.o. Arta Silence Primary Care Provider: Dulce Sellar Other Clinician: Referring Provider: Treating Provider/Extender: Milbert Coulter in Treatment: 4 Verbal / Phone Orders: No Diagnosis Coding ICD-10 Coding Code Description T81.31XA Disruption of external operation (surgical) wound, not elsewhere classified, initial encounter L98.492 Non-pressure chronic ulcer of skin of other sites with fat layer exposed Discharge From [facility] Services Discharge from Wound Care Center - Call if any future wound care needs. Electronic Signature(s) Signed: 05/10/2023 4:26:17 PM By: Allen Derry PA-C Signed: 05/10/2023 5:02:36 PM By: Shawn Stall RN, BSN Entered By: Shawn Stall on 05/10/2023 13:00:20 Poage, Nohelia ([id number]) [id number] Page 3 of  5 -------------------------------------------------------------------------------- Problem List Details Patient Name: Date of Service: Haley Wyatt 05/10/2023 12:30 PM Medical Record Number: [id number] Patient Account Number: [id number] Date of Birth/Sex: Treating RN: 04-09-1976 (47 y.o. F) Primary Care Provider: Dulce Sellar Other Clinician: Referring Provider: Treating Provider/Extender: Milbert Coulter in Treatment: 4 Active Problems ICD-10 Encounter Code Description Active Date MDM Diagnosis T81.31XA Disruption of external operation (surgical) wound, not elsewhere classified, 04/12/2023 No Yes initial encounter L98.492 Non-pressure chronic ulcer of skin of other sites with fat layer exposed 04/12/2023 No Yes Inactive Problems Resolved Problems Electronic Signature(s) Signed: 05/10/2023 12:47:01 PM By: Allen Derry PA-C Entered By: Allen Derry on 05/10/2023 12:47:01 -------------------------------------------------------------------------------- Progress Note Details Patient Name: Date of Service: Haley Wyatt, [name] 05/10/2023 12:30 PM Medical Record Number: [id number] Patient Account Number: [id number] Date of Birth/Sex: Treating RN: 01-29-1976 (47 y.o. F) Primary Care Provider: Dulce Sellar Other Clinician: Referring Provider: Treating Provider/Extender: Milbert Coulter in Treatment: 4 Subjective Chief Complaint Information obtained from Patient Umbilical surgical ulcer s/p abdominoplasty History of Present Illness (HPI) 04-12-2023 upon evaluation today patient appears to be doing poorly today with regard to her wound. She subsequently has an umbilical ulcer which is actually secondary to surgery. She had a extensive tummy tuck which was actually performed abroad and subsequently they did an extremely good job with that which is awesome. With that being said she unfortunately has several sutures that are  actually embedded in the umbilicus region which she was not able to get anyone to take out at her previous clinic where she has been being seen. With that being said other than the swollen area everything else has been healing nicely. Nonetheless I do believe this is actually somewhat deep-rooted beyond where just the sutures need to be removed to be honest. 04-26-2023 upon evaluation today patient appears to be doing well currently in regard to her umbilical ulcer. This is a surgical wound and does seem to be doing much better. Fortunately I do not see any evidence of active infection locally nor systemically which is great news.  No fevers, chills, nausea, vomiting, or diarrhea. 05-03-2023 upon evaluation patient appears to be doing excellent in regard to her wound which is actually measuring significantly smaller compared to even last week's evaluation. In fact I think the majority of the sidewalls are actually healed there is just a very tiny area in the base of the wound that might still be open. Nonetheless she wants to use the plug for the umbilical area in order to ensure that the umbilical region does not completely closed up and heals internally. For that reason I believe that she could do this at this point but I would want to use a little bit of mupirocin on the plug that she is make sure to clean [facility], Margy ([id number]) [id number] Page 4 of 5 it well between times that she puts this in. She voiced understanding. 05-10-2023 upon evaluation today patient appears to be doing well currently in regard to her wound which actually appears to be completely healed. I am extremely happy for her knows she is extremely happy as well. Objective Constitutional Well-nourished and well-hydrated in no acute distress. Vitals Time Taken: 1:00 PM, Temperature: 98.1 F, Pulse: 105 bpm, Respiratory Rate: 20 breaths/min, Blood Pressure: 150/84 mmHg. Respiratory normal breathing  without difficulty. Psychiatric this patient is able to make decisions and demonstrates good insight into disease process. Alert and Oriented x 3. pleasant and cooperative. General Notes: Upon inspection patient's wound bed actually showed signs of good granulation and epithelization at this point. I do not see anything open I think she has healed and looks excellent. Overall I am extremely happy with where we stand she is going to use her spacer for the umbilical area and now that it is closed I think this is appropriate and will keep things looking more natural. Integumentary (Hair, Skin) Wound #1 status is Open. Original cause of wound was Surgical Injury. The date acquired was: 02/17/2023. The wound has been in treatment 4 weeks. The wound is located on the Umbilicus. The wound measures 0cm length x 0cm width x 0cm depth; 0cm^2 area and 0cm^3 volume. There is no tunneling or undermining noted. There is a none present amount of drainage noted. The wound margin is distinct with the outline attached to the wound base. There is no granulation within the wound bed. There is no necrotic tissue within the wound bed. The periwound skin appearance had no abnormalities noted for moisture. The periwound skin appearance did not exhibit: Callus, Crepitus, Excoriation, Induration, Rash, Scarring, Atrophie Blanche, Cyanosis, Ecchymosis, Hemosiderin Staining, Mottled, Pallor, Rubor, Erythema. Periwound temperature was noted as No Abnormality. Assessment Active Problems ICD-10 Disruption of external operation (surgical) wound, not elsewhere classified, initial encounter Non-pressure chronic ulcer of skin of other sites with fat layer exposed Plan Discharge From [facility] Services: Discharge from Wound Care Center - Call if any future wound care needs. 1. I would recommend that at this point she can discontinue wound care services see her any longer here in clinic. 2. I am going to recommend as well the patient  should continue to monitor for any signs of infection or worsening. If any of this occurs she should let me know. We will see her back for follow-up visit as needed. Electronic Signature(s) Signed: 05/10/2023 1:36:33 PM By: Allen Derry PA-C Entered By: Allen Derry on 05/10/2023 13:36:33 SuperBill Details -------------------------------------------------------------------------------- Clearnce Sorrel ([id number]) [id number] Page 5 of 5 Patient Name: Date of Service: Haley Wyatt 05/10/2023 Medical Record Number: [id number] Patient Account Number: [id number] Date of  Birth/Sex: Treating RN: 1976-05-31 (47 y.o. [name], Yvonne Kendall Primary Care Provider: Dulce Sellar Other Clinician: Referring Provider: Treating Provider/Extender: Milbert Coulter in Treatment: 4 Diagnosis Coding ICD-10 Codes Code Description T81.31XA Disruption of external operation (surgical) wound, not elsewhere classified, initial encounter L98.492 Non-pressure chronic ulcer of skin of other sites with fat layer exposed Facility Procedures CPT4 Code Description Modifier Quantity [id number] - WOUND CARE VISIT-LEV 3 EST PT 1 Physician Procedures Quantity CPT4 Code Description Modifier [id number] - WC PHYS LEVEL 3 - EST PT 1 ICD-10 Diagnosis Description T81.31XA Disruption of external operation (surgical) wound, not elsewhere classified, initial encounter L98.492 Non-pressure chronic ulcer of skin of other sites with fat layer exposed Electronic Signature(s) Signed: 05/10/2023 1:37:06 PM By: Allen Derry PA-C Entered By: Allen Derry on 05/10/2023 13:37:06

## 2023-05-10 NOTE — Progress Notes (Signed)
VANDERSTELT, Harmonee ([id number]) [id number] Page 1 of 6 Visit Report for 05/10/2023 Arrival Information Details Patient Name: Date of Service: Haley Wyatt 05/10/2023 12:30 PM Medical Record Number: [id number] Patient Account Number: [id number] Date of Birth/Sex: Treating RN: 1976-09-11 (47 y.o. Haley Wyatt, Haley Wyatt Primary Care Haley Wyatt: Haley Wyatt Other Clinician: Referring Haley Wyatt: Treating Haley Wyatt/Extender: Haley Wyatt in Treatment: 4 Visit Information History Since Last Visit Added or deleted any medications: No Patient Arrived: Ambulatory Any new allergies or adverse reactions: No Arrival Time: 12:45 Had a fall or experienced change in No Accompanied By: self activities of daily living that may affect Transfer Assistance: None risk of falls: Patient Identification Verified: Yes Signs or symptoms of abuse/neglect since last visito No Secondary Verification Process Completed: Yes Hospitalized since last visit: No Patient Requires Transmission-Based Precautions: No Implantable device outside of the clinic excluding No Patient Has Alerts: No cellular tissue based products placed in the center since last visit: Has Dressing in Place as Prescribed: Yes Pain Present Now: No Electronic Signature(s) Signed: 05/10/2023 5:02:36 PM By: Shawn Stall RN, BSN Entered By: Shawn Stall on 05/10/2023 12:56:57 -------------------------------------------------------------------------------- Clinic Level of Care Assessment Details Patient Name: Date of Service: Haley Wyatt 05/10/2023 12:30 PM Medical Record Number: [id number] Patient Account Number: [id number] Date of Birth/Sex: Treating RN: 1976-01-05 (47 y.o. Haley Wyatt, Haley Wyatt Primary Care Desa Rech: Haley Wyatt Other Clinician: Referring Harald Quevedo: Treating Hollyann Pablo/Extender: Haley Wyatt in Treatment: 4 Clinic Level of Care Assessment  Items TOOL 4 Quantity Score X- 1 0 Use when only an EandM is performed on FOLLOW-UP visit ASSESSMENTS - Nursing Assessment / Reassessment X- 1 10 Reassessment of Co-morbidities (includes updates in patient status) X- 1 5 Reassessment of Adherence to Treatment Plan ASSESSMENTS - Wound and Skin A ssessment / Reassessment X - Simple Wound Assessment / Reassessment - one wound 1 5 []  - 0 Complex Wound Assessment / Reassessment - multiple wounds []  - 0 Dermatologic / Skin Assessment (not related to wound area) ASSESSMENTS - Focused Assessment []  - 0 Circumferential Edema Measurements - multi extremities []  - 0 Nutritional Assessment / Counseling / Intervention Eddins, Cala ([id number]) [id number] Page 2 of 6 []  - 0 Lower Extremity Assessment (monofilament, tuning fork, pulses) []  - 0 Peripheral Arterial Disease Assessment (using hand held doppler) ASSESSMENTS - Ostomy and/or Continence Assessment and Care []  - 0 Incontinence Assessment and Management []  - 0 Ostomy Care Assessment and Management (repouching, etc.) PROCESS - Coordination of Care X - Simple Patient / Family Education for ongoing care 1 15 []  - 0 Complex (extensive) Patient / Family Education for ongoing care X- 1 10 Staff obtains Chiropractor, Records, T Results / Process Orders est []  - 0 Staff telephones HHA, Nursing Homes / Clarify orders / etc []  - 0 Routine Transfer to another Facility (non-emergent condition) []  - 0 Routine Hospital Admission (non-emergent condition) []  - 0 New Admissions / Manufacturing engineer / Ordering NPWT Apligraf, etc. , []  - 0 Emergency Hospital Admission (emergent condition) X- 1 10 Simple Discharge Coordination []  - 0 Complex (extensive) Discharge Coordination PROCESS - Special Needs []  - 0 Pediatric / Minor Patient Management []  - 0 Isolation Patient Management []  - 0 Hearing / Language / Visual special needs []  - 0 Assessment of  Community assistance (transportation, D/C planning, etc.) []  - 0 Additional assistance / Altered mentation []  - 0 Support Surface(s) Assessment (bed, cushion, seat, etc.) INTERVENTIONS - Wound Cleansing / Measurement X - Simple Wound Cleansing -  one wound 1 5 []  - 0 Complex Wound Cleansing - multiple wounds X- 1 5 Wound Imaging (photographs - any number of wounds) []  - 0 Wound Tracing (instead of photographs) X- 1 5 Simple Wound Measurement - one wound []  - 0 Complex Wound Measurement - multiple wounds INTERVENTIONS - Wound Dressings X - Small Wound Dressing one or multiple wounds 1 10 []  - 0 Medium Wound Dressing one or multiple wounds []  - 0 Large Wound Dressing one or multiple wounds []  - 0 Application of Medications - topical []  - 0 Application of Medications - injection INTERVENTIONS - Miscellaneous []  - 0 External ear exam []  - 0 Specimen Collection (cultures, biopsies, blood, body fluids, etc.) []  - 0 Specimen(s) / Culture(s) sent or taken to Lab for analysis []  - 0 Patient Transfer (multiple staff / Nurse, adult / Similar devices) []  - 0 Simple Staple / Suture removal (25 or less) []  - 0 Complex Staple / Suture removal (26 or more) []  - 0 Hypo / Hyperglycemic Management (close monitor of Blood Glucose) Haley Wyatt, Haley Wyatt ([id number]) [id number] Page 3 of 6 []  - 0 Ankle / Brachial Index (ABI) - do not check if billed separately X- 1 5 Vital Signs Has the patient been seen at the hospital within the last three years: Yes Total Score: 85 Level Of Care: New/Established - Level 3 Electronic Signature(s) Signed: 05/10/2023 5:02:36 PM By: Shawn Stall RN, BSN Entered By: Shawn Stall on 05/10/2023 13:00:54 -------------------------------------------------------------------------------- Encounter Discharge Information Details Patient Name: Date of Service: Haley Wyatt, Haley Wyatt 05/10/2023 12:30 PM Medical Record Number: [id number] Patient  Account Number: [id number] Date of Birth/Sex: Treating RN: 11/19/1975 (47 y.o. Haley Wyatt Primary Care Lizza Huffaker: Haley Wyatt Other Clinician: Referring Jaielle Dlouhy: Treating Analiyah Lechuga/Extender: Haley Wyatt in Treatment: 4 Encounter Discharge Information Items Discharge Condition: Stable Ambulatory Status: Ambulatory Discharge Destination: Home Transportation: Private Auto Accompanied By: self Schedule Follow-up Appointment: Yes Clinical Summary of Care: Electronic Signature(s) Signed: 05/10/2023 5:02:36 PM By: Shawn Stall RN, BSN Entered By: Shawn Stall on 05/10/2023 13:01:33 -------------------------------------------------------------------------------- Lower Extremity Assessment Details Patient Name: Date of Service: Haley Wyatt 05/10/2023 12:30 PM Medical Record Number: [id number] Patient Account Number: [id number] Date of Birth/Sex: Treating RN: Sep 01, 1976 (47 y.o. Haley Wyatt Primary Care Zeb Rawl: Haley Wyatt Other Clinician: Referring Shihab States: Treating Haley Wyatt/Extender: Haley Wyatt in Treatment: 4 Electronic Signature(s) Signed: 05/10/2023 5:02:36 PM By: Shawn Stall RN, BSN Entered By: Shawn Stall on 05/10/2023 12:57:08 -------------------------------------------------------------------------------- Multi-Disciplinary Care Plan Details Patient Name: Date of Service: Haley Wyatt, Haley Wyatt 05/10/2023 12:30 PM Medical Record Number: [id number] Patient Account Number: [id number] BURLING, Kathryne ([id number]) [id number] Page 4 of 6 Date of Birth/Sex: Treating RN: 06-13-1976 (47 y.o. Haley Wyatt Primary Care Novia Lansberry: Other Clinician: Dulce Wyatt Referring Malkie Wille: Treating Haley Wyatt/Extender: Haley Wyatt in Treatment: 4 Active Inactive Electronic Signature(s) Signed: 05/10/2023 5:02:36 PM By: Shawn Stall RN,  BSN Entered By: Shawn Stall on 05/10/2023 13:00:32 -------------------------------------------------------------------------------- Pain Assessment Details Patient Name: Date of Service: Haley Wyatt, Haley Wyatt 05/10/2023 12:30 PM Medical Record Number: [id number] Patient Account Number: [id number] Date of Birth/Sex: Treating RN: 04-26-1976 (47 y.o. Haley Wyatt Primary Care Ilias Stcharles: Haley Wyatt Other Clinician: Referring Liz Pinho: Treating Alesha Jaffee/Extender: Haley Wyatt in Treatment: 4 Active Problems Location of Pain Severity and Description of Pain Patient Has Paino No Site Locations Pain Management and Medication Current Pain Management: Electronic Signature(s) Signed: 05/10/2023 5:02:36 PM By: Shawn Stall RN,  BSN Entered By: Shawn Stall on 05/10/2023 12:57:04 -------------------------------------------------------------------------------- Wound Assessment Details Patient Name: Date of Service: Haley Wyatt 05/10/2023 12:30 PM Medical Record Number: [id number] Patient Account Number: [id number] Date of Birth/Sex: Treating RN: 1976-09-07 (47 y.o. Haley Wyatt [place], Rasheka ([id number]) [id number] Page 5 of 6 Primary Care Pratham Cassatt: Haley Wyatt Other Clinician: Referring Geovanni Rahming: Treating Amylia Collazos/Extender: Haley Wyatt in Treatment: 4 Wound Status Wound Number: 1 Primary Etiology: Abscess Wound Location: Umbilicus Wound Status: Open Wounding Event: Surgical Injury Comorbid History: Hypertension Date Acquired: 02/17/2023 Weeks Of Treatment: 4 Clustered Wound: No Photos Wound Measurements Length: (cm) Width: (cm) Depth: (cm) Area: (cm) Volume: (cm) 0 % Reduction in Area: 100% 0 % Reduction in Volume: 100% 0 Epithelialization: Large (67-100%) 0 Tunneling: No 0 Undermining: No Wound Description Classification: Full Thickness Without Exposed Support  S Wound Margin: Distinct, outline attached Exudate Amount: None Present tructures Foul Odor After Cleansing: No Slough/Fibrino No Wound Bed Granulation Amount: None Present (0%) Exposed Structure Necrotic Amount: None Present (0%) Fascia Exposed: No Fat Layer (Subcutaneous Tissue) Exposed: No Tendon Exposed: No Muscle Exposed: No Joint Exposed: No Bone Exposed: No Periwound Skin Texture Texture Color No Abnormalities Noted: No No Abnormalities Noted: No Callus: No Atrophie Blanche: No Crepitus: No Cyanosis: No Excoriation: No Ecchymosis: No Induration: No Erythema: No Rash: No Hemosiderin Staining: No Scarring: No Mottled: No Pallor: No Moisture Rubor: No No Abnormalities Noted: Yes Temperature / Pain Temperature: No Abnormality Electronic Signature(s) Signed: 05/10/2023 5:02:36 PM By: Shawn Stall RN, BSN Entered By: Shawn Stall on 05/10/2023 12:57:47 Sydney, Darien ([id number]) [id number] Page 6 of 6 -------------------------------------------------------------------------------- Vitals Details Patient Name: Date of Service: Haley Wyatt 05/10/2023 12:30 PM Medical Record Number: [id number] Patient Account Number: [id number] Date of Birth/Sex: Treating RN: 1976-04-20 (47 y.o. Haley Wyatt, Haley Wyatt Primary Care Tiron Suski: Haley Wyatt Other Clinician: Referring Zissy Hamlett: Treating Bettejane Leavens/Extender: Haley Wyatt in Treatment: 4 Vital Signs Time Taken: 13:00 Temperature (F): 98.1 Pulse (bpm): 105 Respiratory Rate (breaths/min): 20 Blood Pressure (mmHg): 150/84 Reference Range: 80 - 120 mg / dl Electronic Signature(s) Signed: 05/10/2023 5:02:36 PM By: Shawn Stall RN, BSN Entered By: Shawn Stall on 05/10/2023 13:00:05

## 2023-05-22 ENCOUNTER — Ambulatory Visit: Payer: 59 | Admitting: Family

## 2023-05-22 ENCOUNTER — Other Ambulatory Visit: Payer: Self-pay | Admitting: Family

## 2023-05-22 VITALS — BP 124/78 | HR 95 | Temp 97.8°F | Ht 69.0 in | Wt 250.8 lb

## 2023-05-22 DIAGNOSIS — K645 Perianal venous thrombosis: Secondary | ICD-10-CM

## 2023-05-22 DIAGNOSIS — K5903 Drug induced constipation: Secondary | ICD-10-CM

## 2023-05-22 DIAGNOSIS — B3731 Acute candidiasis of vulva and vagina: Secondary | ICD-10-CM | POA: Diagnosis not present

## 2023-05-22 MED ORDER — LIDOCAINE-HYDROCORTISONE ACE 3-2.5 % RE KIT
PACK | RECTAL | 1 refills | Status: DC
Start: 2023-05-22 — End: 2023-05-29

## 2023-05-22 MED ORDER — FLUCONAZOLE 150 MG PO TABS
ORAL_TABLET | ORAL | 0 refills | Status: DC
Start: 2023-05-22 — End: 2023-07-14

## 2023-05-22 MED ORDER — HYDROCORTISONE ACETATE 25 MG RE SUPP
25.0000 mg | Freq: Two times a day (BID) | RECTAL | 1 refills | Status: DC
Start: 2023-05-22 — End: 2023-05-22

## 2023-05-22 NOTE — Telephone Encounter (Signed)
Please call pt and advise she keep using over the counter suppositories (twice a day!) (Preparation H or Anusol) as her ins is not covering what I sent

## 2023-05-22 NOTE — Progress Notes (Deleted)
Patient ID: Haley Wyatt, female    DOB: 02/01/1976, 47 y.o.   MRN: [id number]  No chief complaint on file.   HPI:      Anal pain:       Assessment & Plan:     Subjective:    Outpatient Medications Prior to Visit  Medication Sig Dispense Refill   busPIRone (BUSPAR) 5 MG tablet Take 1-2 tablets (5-10 mg total) by mouth 3 (three) times daily as needed (Take 3 times daily for 1-2 weeks at least, then can back down to twice a day or daily use.). 60 tablet 5   ketoconazole (NIZORAL) 2 % cream Apply 1 application topically daily. 30 g 3   lisdexamfetamine (VYVANSE) 30 MG capsule Take 1 capsule (30 mg total) by mouth daily before breakfast. 30 capsule 0   lisdexamfetamine (VYVANSE) 30 MG capsule Take 1 capsule (30 mg total) by mouth daily before breakfast. 30 capsule 0   [START ON 05/30/2023] lisdexamfetamine (VYVANSE) 30 MG capsule Take 1 capsule (30 mg total) by mouth daily before breakfast. 30 capsule 0   losartan (COZAAR) 25 MG tablet Take 1 tablet (25 mg total) by mouth daily. 90 tablet 1   metFORMIN (GLUCOPHAGE) 1000 MG tablet Take 0.5 tablets (500 mg total) by mouth daily with breakfast. 90 tablet 1   metroNIDAZOLE (METROGEL) 0.75 % vaginal gel Place 1 Applicatorful vaginally at bedtime. 70 g 0   Multiple Vitamins-Minerals (MULTI-VITE) LIQD Take 30 mLs by mouth daily.     tirzepatide (MOUNJARO) 7.5 MG/0.5ML Pen Inject 7.5 mg into the skin once a week. 6 mL 0   No facility-administered medications prior to visit.   Past Medical History:  Diagnosis Date   ADD (attention deficit disorder)    ADHD    Allergy    Back pain    "muscle strain from exercising"   Back pain    Borderline diabetes    states "levels have been about 102"   Chest pain    Chronic headaches    Constipation    Corneal ulceration 06/23/2021   Dysmenorrhea 01/28/2015   Fibroid tumor    inside uterus   GERD 06/11/2010   Qualifier: Diagnosis of  By: Benjamin Stain MD, Maisie Fus     GERD (gastroesophageal  reflux disease)    PT STATES "DOES NOT HAVE IT NOW"   H/O hiatal hernia    Heel pain, bilateral 06/23/2021   Hypertension    NO MEDS.. BP CONTROLLED   Hypothyroidism    HYPOTHYROIDISM 04/16/2009   Qualifier: Diagnosis of  By: Benjamin Stain MD, Thomas     Joint pain    LEG EDEMA, 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   Qualifier: Diagnosis of  By: Benjamin Stain MD, Thomas     OBESITY 04/16/2009   Other fatigue 08/18/2020   Other specified hypothyroidism 08/18/2020   Sedimentation rate elevation 06/23/2021   SOB (shortness of breath) on exertion    Swelling of both lower extremities    Urge incontinence 05/02/2022   Past Surgical History:  Procedure Laterality Date   ABDOMINAL HYSTERECTOMY     ABDOMINOPLASTY/PANNICULECTOMY WITH LIPOSUCTION  02/15/2023   surgery in [place]   BILATERAL SALPINGECTOMY Bilateral 01/28/2015   Procedure: BILATERAL SALPINGECTOMY;  Surgeon: Hal Morales, MD;  Location: [facility];  Service: Gynecology;  Laterality: Bilateral;   CARPAL TUNNEL RELEASE Right    DILITATION & CURRETTAGE/HYSTROSCOPY WITH HYDROTHERMAL ABLATION N/A 04/16/2014   Procedure: DILATATION & CURETTAGE/HYSTEROSCOPY WITH HYDROTHERMAL ABLATION;  Surgeon: Kathreen Cosier, MD;  Location: [facility];  Service: Gynecology;  Laterality: N/A;   HIATAL HERNIA REPAIR N/A 02/11/2014   Procedure: LAPAROSCOPIC REPAIR OF HIATAL HERNIA;  Surgeon: Valarie Merino, MD;  Location: [facility];  Service: General;  Laterality: N/A;   LAPAROSCOPIC GASTRIC SLEEVE RESECTION N/A 02/11/2014   Procedure: LAPAROSCOPIC GASTRIC SLEEVE RESECTION;  Surgeon: Valarie Merino, MD;  Location: [facility];  Service: General;  Laterality: N/A;   SHOULDER SURGERY     left shoulder loose body removal   SUPRACERVICAL ABDOMINAL HYSTERECTOMY Bilateral 01/28/2015   Procedure: TOTAL SUPER CERVICAL ABDOMINAL HYSTERECTOMY BILATERAL SALPINGECTOMY;  Surgeon: Hal Morales, MD;  Location: [facility];  Service: Gynecology;  Laterality: Bilateral;    TUBAL LIGATION     UPPER GI ENDOSCOPY  02/11/2014   Procedure: UPPER GI ENDOSCOPY;  Surgeon: Valarie Merino, MD;  Location: [facility];  Service: General;;   No Known Allergies    Objective:    Physical Exam Vitals and nursing note reviewed.  Constitutional:      Appearance: Normal appearance.  Cardiovascular:     Rate and Rhythm: Normal rate and regular rhythm.  Pulmonary:     Effort: Pulmonary effort is normal.     Breath sounds: Normal breath sounds.  Musculoskeletal:        General: Normal range of motion.  Skin:    General: Skin is warm and dry.  Neurological:     Mental Status: She is alert.  Psychiatric:        Mood and Affect: Mood normal.        Behavior: Behavior normal.    LMP 04/28/2014  Wt Readings from Last 3 Encounters:  03/30/23 273 lb (123.8 kg)  03/08/23 280 lb (127 kg)  03/06/23 294 lb 15.6 oz (133.8 kg)       Dulce Sellar, NP

## 2023-05-22 NOTE — Progress Notes (Signed)
Patient ID: Haley Wyatt, female    DOB: [birth year], 47 y.o.   MRN: [id number]  Chief Complaint  Patient presents with   Constipation    Couple weeks, Miralax helping with constipation, but she it's burning whenever she uses the bathroom.     HPI:      Constipation, anal burning:   can feel a lump outside and had some bleeding in the toilet. Has been taking Miralax gummies. Pt reports ongoing pain & burning with every BM. Has tried OTC suppositories - she states she inserted once or twice, and using Prep H cream.   Assessment & Plan:  1. Vaginal yeast infection  - fluconazole (DIFLUCAN) 150 MG tablet; Take 1 pill today and the 2nd pill in 3 days.  Dispense: 2 tablet; Refill: 0  2. Hemorrhoid thrombosis sending suppositories and pain relieving cream, advised pt on use & SE of both. Can use a little vaseline at the end of suppository to help insert. If sx do not improve, will need ER or UC or GI visit to get ligation or other tx.  - hydrocortisone (ANUSOL-HC) 25 MG suppository; Place 1 suppository (25 mg total) rectally 2 (two) times daily.  Dispense: 12 suppository; Refill: 1 - Lidocaine-Hydrocortisone Ace 3-2.5 % KIT; Apply rectally twice a day for 7 days  Dispense: 14 kit; Refill: 1  3. Drug-induced constipation- pt on Vyvanse and Mounjaro. Advised she has to get on a daily regimen. Increase water intake to 2L daily, use generic stool softener, 1-2 pills daily OR generic Miralax powder 1-2 scoops per day (advised against using gummies). Can add OTC Benefiber or Metamucil in addition to high fiber diet.    Subjective:    Outpatient Medications Prior to Visit  Medication Sig Dispense Refill   busPIRone (BUSPAR) 5 MG tablet Take 1-2 tablets (5-10 mg total) by mouth 3 (three) times daily as needed (Take 3 times daily for 1-2 weeks at least, then can back down to twice a day or daily use.). 60 tablet 5   ketoconazole (NIZORAL) 2 % cream Apply 1 application topically daily. 30 g 3    lisdexamfetamine (VYVANSE) 30 MG capsule Take 1 capsule (30 mg total) by mouth daily before breakfast. 30 capsule 0   [START ON 05/30/2023] lisdexamfetamine (VYVANSE) 30 MG capsule Take 1 capsule (30 mg total) by mouth daily before breakfast. 30 capsule 0   losartan (COZAAR) 25 MG tablet Take 1 tablet (25 mg total) by mouth daily. 90 tablet 1   metFORMIN (GLUCOPHAGE) 1000 MG tablet Take 0.5 tablets (500 mg total) by mouth daily with breakfast. 90 tablet 1   metroNIDAZOLE (METROGEL) 0.75 % vaginal gel Place 1 Applicatorful vaginally at bedtime. 70 g 0   Multiple Vitamins-Minerals (MULTI-VITE) LIQD Take 30 mLs by mouth daily.     tirzepatide (MOUNJARO) 7.5 MG/0.5ML Pen Inject 7.5 mg into the skin once a week. 6 mL 0   lisdexamfetamine (VYVANSE) 30 MG capsule Take 1 capsule (30 mg total) by mouth daily before breakfast. 30 capsule 0   No facility-administered medications prior to visit.   Past Medical History:  Diagnosis Date   ADD (attention deficit disorder)    ADHD    Allergy    Back pain    "muscle strain from exercising"   Back pain    Borderline diabetes    states "levels have been about 102"   Chest pain    Chronic headaches    Constipation    Corneal ulceration 06/23/2021  Dysmenorrhea 01/28/2015   Fibroid tumor    inside uterus   GERD 06/11/2010   Qualifier: Diagnosis of  By: Benjamin Stain MD, Maisie Fus     GERD (gastroesophageal reflux disease)    PT STATES "DOES NOT HAVE IT NOW"   H/O hiatal hernia    Heel pain, bilateral 06/23/2021   Hypertension    NO MEDS.. BP CONTROLLED   Hypothyroidism    HYPOTHYROIDISM 04/16/2009   Qualifier: Diagnosis of  By: Benjamin Stain MD, Thomas     Joint pain    LEG EDEMA, 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   Qualifier: Diagnosis of  By: Benjamin Stain MD, Thomas     OBESITY 04/16/2009   Other fatigue 08/18/2020   Other specified hypothyroidism 08/18/2020   Sedimentation rate elevation 06/23/2021   SOB (shortness of breath) on exertion    Swelling of  both lower extremities    Urge incontinence 05/02/2022   Past Surgical History:  Procedure Laterality Date   ABDOMINAL HYSTERECTOMY     ABDOMINOPLASTY/PANNICULECTOMY WITH LIPOSUCTION  02/15/2023   surgery in [place]   BILATERAL SALPINGECTOMY Bilateral 01/28/2015   Procedure: BILATERAL SALPINGECTOMY;  Surgeon: Hal Morales, MD;  Location: [facility];  Service: Gynecology;  Laterality: Bilateral;   CARPAL TUNNEL RELEASE Right    DILITATION & CURRETTAGE/HYSTROSCOPY WITH HYDROTHERMAL ABLATION N/A 04/16/2014   Procedure: DILATATION & CURETTAGE/HYSTEROSCOPY WITH HYDROTHERMAL ABLATION;  Surgeon: Kathreen Cosier, MD;  Location: [facility];  Service: Gynecology;  Laterality: N/A;   HIATAL HERNIA REPAIR N/A 02/11/2014   Procedure: LAPAROSCOPIC REPAIR OF HIATAL HERNIA;  Surgeon: Valarie Merino, MD;  Location: [facility];  Service: General;  Laterality: N/A;   LAPAROSCOPIC GASTRIC SLEEVE RESECTION N/A 02/11/2014   Procedure: LAPAROSCOPIC GASTRIC SLEEVE RESECTION;  Surgeon: Valarie Merino, MD;  Location: [facility];  Service: General;  Laterality: N/A;   SHOULDER SURGERY     left shoulder loose body removal   SUPRACERVICAL ABDOMINAL HYSTERECTOMY Bilateral 01/28/2015   Procedure: TOTAL SUPER CERVICAL ABDOMINAL HYSTERECTOMY BILATERAL SALPINGECTOMY;  Surgeon: Hal Morales, MD;  Location: [facility];  Service: Gynecology;  Laterality: Bilateral;   TUBAL LIGATION     UPPER GI ENDOSCOPY  02/11/2014   Procedure: UPPER GI ENDOSCOPY;  Surgeon: Valarie Merino, MD;  Location: [facility];  Service: General;;   No Known Allergies    Objective:    Physical Exam Vitals and nursing note reviewed.  Constitutional:      Appearance: Normal appearance.  Cardiovascular:     Rate and Rhythm: Normal rate and regular rhythm.  Pulmonary:     Effort: Pulmonary effort is normal.     Breath sounds: Normal breath sounds.  Musculoskeletal:        General: Normal range of motion.  Skin:    General: Skin is warm  and dry.  Neurological:     Mental Status: She is alert.  Psychiatric:        Mood and Affect: Mood normal.        Behavior: Behavior normal.    BP 124/78   Pulse 95   Temp 97.8 F (36.6 C)   Ht 5\' 9"  (1.753 m)   Wt 250 lb 12.8 oz (113.8 kg)   LMP 04/28/2014   SpO2 96%   BMI 37.04 kg/m  Wt Readings from Last 3 Encounters:  05/22/23 250 lb 12.8 oz (113.8 kg)  03/30/23 273 lb (123.8 kg)  03/08/23 280 lb (127 kg)       Dulce Sellar, NP

## 2023-05-29 ENCOUNTER — Other Ambulatory Visit: Payer: Self-pay | Admitting: Family

## 2023-05-29 DIAGNOSIS — K645 Perianal venous thrombosis: Secondary | ICD-10-CM

## 2023-05-29 MED ORDER — HYDROCORTISONE (PERIANAL) 1 % EX CREA
1.0000 | TOPICAL_CREAM | CUTANEOUS | 1 refills | Status: DC
Start: 2023-05-29 — End: 2023-12-20

## 2023-05-31 ENCOUNTER — Ambulatory Visit
Admission: EM | Admit: 2023-05-31 | Discharge: 2023-05-31 | Disposition: A | Discharge status: Home / Self Care | Payer: 59 | Attending: Physician Assistant | Admitting: Physician Assistant

## 2023-05-31 DIAGNOSIS — Y839 Surgical procedure, unspecified as the cause of abnormal reaction of the patient, or of later complication, without mention of misadventure at the time of the procedure: Secondary | ICD-10-CM | POA: Diagnosis not present

## 2023-05-31 DIAGNOSIS — Z87891 Personal history of nicotine dependence: Secondary | ICD-10-CM | POA: Insufficient documentation

## 2023-05-31 DIAGNOSIS — T8149XA Infection following a procedure, other surgical site, initial encounter: Secondary | ICD-10-CM | POA: Insufficient documentation

## 2023-05-31 DIAGNOSIS — J029 Acute pharyngitis, unspecified: Secondary | ICD-10-CM | POA: Diagnosis not present

## 2023-05-31 DIAGNOSIS — Z1152 Encounter for screening for COVID-19: Secondary | ICD-10-CM | POA: Insufficient documentation

## 2023-05-31 MED ORDER — DOXYCYCLINE HYCLATE 100 MG PO CAPS: 100.0000 mg | ORAL_CAPSULE | Freq: Two times a day (BID) | ORAL | 0 refills | Status: DC

## 2023-05-31 NOTE — ED Triage Notes (Signed)
"  I am coming in today for Naval smells so bad it is foul, recent skin removal and naval area recreated due to corrective surgery". Recent visit to [facility] for treatment but most recent infection not resolved.   Also "my throat is scratchy and I feel really really bad". "Tired". Some runny nose. No cough. No new/unexplained rash.

## 2023-05-31 NOTE — ED Provider Notes (Signed)
[name]    CSN: [id number] Arrival date & time: 05/31/23  1349      History   Chief Complaint Chief Complaint  Patient presents with   Skin Problem    Umbilical area (post surgical)-Recurrent   Fatigue    & Sore throat/chills.    HPI Haley Wyatt is a 47 y.o. female.   Patient here today for evaluation of drainage from her umbilical scar.  She states that she has had healing from recent surgical procedure for skin removal but states that she started to have a foul-smelling discharge which concerned her for infection.  She did have prior infection to same area.  She also reports that her throat is scratchy and she has felt tired.  She reports some runny nose but no cough.  She denies any vomiting or diarrhea.  The history is provided by the patient.    Past Medical History:  Diagnosis Date   ADD (attention deficit disorder)    ADHD    Allergy    Back pain    "muscle strain from exercising"   Back pain    Borderline diabetes    states "levels have been about 102"   Chest pain    Chronic headaches    Constipation    Corneal ulceration 06/23/2021   Dysmenorrhea 01/28/2015   Fibroid tumor    inside uterus   GERD 06/11/2010   Qualifier: Diagnosis of  By: Benjamin Stain MD, Maisie Fus     GERD (gastroesophageal reflux disease)    PT STATES "DOES NOT HAVE IT NOW"   H/O hiatal hernia    Heel pain, bilateral 06/23/2021   Hypertension    NO MEDS.. BP CONTROLLED   Hypothyroidism    HYPOTHYROIDISM 04/16/2009   Qualifier: Diagnosis of  By: Benjamin Stain MD, Thomas     Joint pain    LEG EDEMA, 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   Qualifier: Diagnosis of  By: Benjamin Stain MD, Thomas     OBESITY 04/16/2009   Other fatigue 08/18/2020   Other specified hypothyroidism 08/18/2020   Sedimentation rate elevation 06/23/2021   SOB (shortness of breath) on exertion    Swelling of both lower extremities    Urge incontinence 05/02/2022    Patient Active Problem List    Diagnosis Date Noted   Drug-induced constipation 05/22/2023   Situational anxiety 03/30/2023   Type 2 diabetes mellitus with morbid obesity (HCC) 06/15/2022   Uterine leiomyoma 05/02/2022   Menorrhagia 05/02/2022   Attention deficit disorder 05/02/2022   Essential hypertension 08/18/2020   At risk for heart disease 08/18/2020   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   Morbid obesity (HCC) 07/16/2013   CHONDROMALACIA PATELLA, 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    Past Surgical History:  Procedure Laterality Date   ABDOMINAL HYSTERECTOMY     ABDOMINOPLASTY/PANNICULECTOMY WITH LIPOSUCTION  02/15/2023   surgery in [place]   BILATERAL SALPINGECTOMY Bilateral 01/28/2015   Procedure: BILATERAL SALPINGECTOMY;  Surgeon: Hal Morales, MD;  Location: [facility];  Service: Gynecology;  Laterality: Bilateral;   CARPAL TUNNEL RELEASE Right    DILITATION & CURRETTAGE/HYSTROSCOPY WITH HYDROTHERMAL ABLATION N/A 04/16/2014   Procedure: DILATATION & CURETTAGE/HYSTEROSCOPY WITH HYDROTHERMAL ABLATION;  Surgeon: Kathreen Cosier, MD;  Location: [facility];  Service: Gynecology;  Laterality: N/A;   HIATAL HERNIA REPAIR N/A 02/11/2014   Procedure: LAPAROSCOPIC REPAIR OF HIATAL HERNIA;  Surgeon: Valarie Merino, MD;  Location: [facility];  Service: General;  Laterality: N/A;   LAPAROSCOPIC GASTRIC SLEEVE RESECTION N/A 02/11/2014   Procedure: LAPAROSCOPIC GASTRIC  SLEEVE RESECTION;  Surgeon: Valarie Merino, MD;  Location: [facility];  Service: General;  Laterality: N/A;   SHOULDER SURGERY     left shoulder loose body removal   SUPRACERVICAL ABDOMINAL HYSTERECTOMY Bilateral 01/28/2015   Procedure: TOTAL SUPER CERVICAL ABDOMINAL HYSTERECTOMY BILATERAL SALPINGECTOMY;  Surgeon: Hal Morales, MD;  Location: [facility];  Service: Gynecology;  Laterality: Bilateral;   TUBAL LIGATION     UPPER GI ENDOSCOPY  02/11/2014   Procedure: UPPER GI ENDOSCOPY;  Surgeon: Valarie Merino, MD;  Location: [facility];  Service:  General;;    OB History     Gravida  4   Para  4   Term      Preterm      AB      Living  4      SAB      IAB      Ectopic      Multiple      Live Births               Home Medications    Prior to Admission medications   Medication Sig Start Date End Date Taking? Authorizing Provider  busPIRone (BUSPAR) 5 MG tablet Take 1-2 tablets (5-10 mg total) by mouth 3 (three) times daily as needed (Take 3 times daily for 1-2 weeks at least, then can back down to twice a day or daily use.). 03/30/23  Yes Dulce Sellar, NP  doxycycline (VIBRAMYCIN) 100 MG capsule Take 1 capsule (100 mg total) by mouth 2 (two) times daily. 05/31/23  Yes Tomi Bamberger, PA-C  hydrocortisone (ANUSOL-HC) 25 MG suppository INSERT 1 SUPPOSITORY RECTALLY TWICE DAILY 05/22/23  Yes Hudnell, Judeth Cornfield, NP  Hydrocortisone, Perianal, (PREPARATION H) 1 % CREA Apply 1 Application topically as directed. After every BM. 05/29/23  Yes Hudnell, Judeth Cornfield, NP  lisdexamfetamine (VYVANSE) 30 MG capsule Take 1 capsule (30 mg total) by mouth daily before breakfast. 05/30/23 06/29/23 Yes Hudnell, Judeth Cornfield, NP  losartan (COZAAR) 25 MG tablet Take 1 tablet (25 mg total) by mouth daily. 01/11/23  Yes Dulce Sellar, NP  metFORMIN (GLUCOPHAGE) 1000 MG tablet Take 0.5 tablets (500 mg total) by mouth daily with breakfast. 03/30/23  Yes Hudnell, Judeth Cornfield, NP  Multiple Vitamins-Minerals (MULTI-VITE) LIQD Take 30 mLs by mouth daily.   Yes [provider]  tirzepatide Greggory Keen) 7.5 MG/0.5ML Pen Inject 7.5 mg into the skin once a week. 12/28/22  Yes Dulce Sellar, NP  fluconazole (DIFLUCAN) 150 MG tablet Take 1 pill today and the 2nd pill in 3 days. 05/22/23   Dulce Sellar, NP  ketoconazole (NIZORAL) 2 % cream Apply 1 application topically daily. 10/12/21   Janeece Agee, NP  lisdexamfetamine (VYVANSE) 30 MG capsule Take 1 capsule (30 mg total) by mouth daily before breakfast. 03/30/23 04/29/23  Dulce Sellar,  NP  lisdexamfetamine (VYVANSE) 30 MG capsule Take 1 capsule (30 mg total) by mouth daily before breakfast. 04/29/23 05/29/23  Dulce Sellar, NP  metroNIDAZOLE (METROGEL) 0.75 % vaginal gel Place 1 Applicatorful vaginally at bedtime. 01/05/23   Chrzanowski, Lamona Curl, NP    Family History Family History  Problem Relation Age of Onset   Diverticulitis Mother    Alcoholism Mother    Hyperlipidemia Father    Hypertension Father    Diabetes Father    Schizophrenia Father    Depression Father    Mental illness Sister    Drug abuse Sister    Colon cancer Maternal Grandmother    Diabetes Paternal Grandmother  Sudden death Neg Hx    Heart attack Neg Hx     Social History Social History   Tobacco Use   Smoking status: Former    Current packs/day: 0.00    Types: Cigarettes    Quit date: 02/05/2006    Years since quitting: 17.3   Smokeless tobacco: Never  Vaping Use   Vaping status: Never Used  Substance Use Topics   Alcohol use: Not Currently    Comment: Occasionally   Drug use: No     Allergies   Patient has no known allergies.   Review of Systems Review of Systems  Constitutional:  Negative for chills and fever.  HENT:  Positive for congestion and sore throat. Negative for ear pain.   Eyes:  Negative for discharge and redness.  Respiratory:  Negative for cough, shortness of breath and wheezing.   Gastrointestinal:  Negative for abdominal pain, diarrhea, nausea and vomiting.  Skin:  Positive for wound. Negative for color change.     Physical Exam Triage Vital Signs ED Triage Vitals  Encounter Vitals Group     BP 05/31/23 1358 (!) 126/92     Systolic BP Percentile --      Diastolic BP Percentile --      Pulse Rate 05/31/23 1358 96     Resp 05/31/23 1358 18     Temp 05/31/23 1358 98.3 F (36.8 C)     Temp Source 05/31/23 1358 Oral     SpO2 05/31/23 1358 96 %     Weight 05/31/23 1356 245 lb (111.1 kg)     Height 05/31/23 1356 5\' 10"  (1.778 m)     Head  Circumference --      Peak Flow --      Pain Score 05/31/23 1356 7     Pain Loc --      Pain Education --      Exclude from Growth Chart --    No data found.  Updated Vital Signs BP (!) 126/92 (BP Location: Left Arm)   Pulse 96   Temp 98.3 F (36.8 C) (Oral)   Resp 18   Ht 5\' 10"  (1.778 m)   Wt 245 lb (111.1 kg)   LMP 04/28/2014   SpO2 96%   BMI 35.15 kg/m      Physical Exam Vitals and nursing note reviewed.  Constitutional:      General: She is not in acute distress.    Appearance: Normal appearance. She is not ill-appearing.  HENT:     Head: Normocephalic and atraumatic.     Nose: Congestion present.     Mouth/Throat:     Mouth: Mucous membranes are moist.     Pharynx: No oropharyngeal exudate or posterior oropharyngeal erythema.  Eyes:     Conjunctiva/sclera: Conjunctivae normal.  Cardiovascular:     Rate and Rhythm: Normal rate and regular rhythm.     Heart sounds: Normal heart sounds. No murmur heard. Pulmonary:     Effort: Pulmonary effort is normal. No respiratory distress.     Breath sounds: Normal breath sounds. No wheezing, rhonchi or rales.  Skin:    General: Skin is warm and dry.     Comments: Scant purulent drainage noted from umbilical area, no significant erythema  Neurological:     Mental Status: She is alert.  Psychiatric:        Mood and Affect: Mood normal.        Thought Content: Thought content normal.  UC Treatments / Results  Labs (all labs ordered are listed, but only abnormal results are displayed) Labs Reviewed  SARS CORONAVIRUS 2 (TAT 6-24 HRS)    EKG   Radiology No results found.  Procedures Procedures (including critical care time)  Medications Ordered in UC Medications - No data to display  Initial Impression / Assessment and Plan / UC Course  I have reviewed the triage vital signs and the nursing notes.  Pertinent labs & imaging results that were available during my care of the patient were reviewed by me  and considered in my medical decision making (see chart for details).    Given purulent drainage on exam will treat to cover infection.  Doxycycline prescribed.  Discussed possible viral etiology of other upper respiratory symptoms and recommended COVID screening.  Encouraged follow-up if no gradual improvement of any symptoms or with any worsening.  Final Clinical Impressions(s) / UC Diagnoses   Final diagnoses:  Surgical wound infection  Pharyngitis, unspecified etiology   Discharge Instructions   None    ED Prescriptions     Medication Sig Dispense Auth. Provider   doxycycline (VIBRAMYCIN) 100 MG capsule Take 1 capsule (100 mg total) by mouth 2 (two) times daily. 20 capsule Tomi Bamberger, PA-C      PDMP not reviewed this encounter.   Tomi Bamberger, PA-C 05/31/23 [phone]

## 2023-06-07 ENCOUNTER — Encounter (HOSPITAL_BASED_OUTPATIENT_CLINIC_OR_DEPARTMENT_OTHER): Payer: 59 | Attending: Physician Assistant | Admitting: Physician Assistant

## 2023-06-07 DIAGNOSIS — I10 Essential (primary) hypertension: Secondary | ICD-10-CM | POA: Insufficient documentation

## 2023-06-07 DIAGNOSIS — L98492 Non-pressure chronic ulcer of skin of other sites with fat layer exposed: Secondary | ICD-10-CM | POA: Diagnosis not present

## 2023-06-07 DIAGNOSIS — T8131XA Disruption of external operation (surgical) wound, not elsewhere classified, initial encounter: Secondary | ICD-10-CM | POA: Insufficient documentation

## 2023-06-07 DIAGNOSIS — X58XXXA Exposure to other specified factors, initial encounter: Secondary | ICD-10-CM | POA: Insufficient documentation

## 2023-06-07 DIAGNOSIS — L02216 Cutaneous abscess of umbilicus: Secondary | ICD-10-CM | POA: Diagnosis not present

## 2023-06-07 NOTE — Progress Notes (Signed)
TOOLE, Amillion ([id number]) [id number] Page 1 of 6 Visit Report for 06/07/2023 Chief Complaint Document Details Patient Name: Date of Service: Haley Wyatt 06/07/2023 10:30 A M Medical Record Number: [id number] Patient Account Number: [id number] Date of Birth/Sex: Treating RN: 1976/01/11 (47 y.o. F) Primary Care Provider: Dulce Sellar Other Clinician: Referring Provider: Treating Provider/Extender: Milbert Coulter in Treatment: 8 Information Obtained from: Patient Chief Complaint Umbilical surgical ulcer s/p abdominoplasty Electronic Signature(s) Signed: 06/07/2023 12:02:00 PM By: Allen Derry PA-C Entered By: Allen Derry on 06/07/2023 12:02:00 -------------------------------------------------------------------------------- HPI Details Patient Name: Date of Service: [name], [name] 06/07/2023 10:30 A M Medical Record Number: [id number] Patient Account Number: [id number] Date of Birth/Sex: Treating RN: 1976-04-09 (47 y.o. F) Primary Care Provider: Dulce Sellar Other Clinician: Referring Provider: Treating Provider/Extender: Milbert Coulter in Treatment: 8 History of Present Illness HPI Description: 04-12-2023 upon evaluation today patient appears to be doing poorly today with regard to her wound. She subsequently has an umbilical ulcer which is actually secondary to surgery. She had a extensive tummy [place] and subsequently they did an extremely good job with that which is awesome. With that being said she unfortunately has several sutures that are actually embedded in the umbilicus region which she was not able to get anyone to take out at her previous clinic where she has been being seen. With that being said other than the swollen area everything else has been healing nicely. Nonetheless I do believe this is actually somewhat deep-rooted beyond where just  the sutures need to be removed to be honest. 04-26-2023 upon evaluation today patient appears to be doing well currently in regard to her umbilical ulcer. This is a surgical wound and does seem to be doing much better. Fortunately I do not see any evidence of active infection locally nor systemically which is great news. No fevers, chills, nausea, vomiting, or diarrhea. 05-03-2023 upon evaluation patient appears to be doing excellent in regard to her wound which is actually measuring significantly smaller compared to even last week's evaluation. In fact I think the majority of the sidewalls are actually healed there is just a very tiny area in the base of the wound that might still be open. Nonetheless she wants to use the plug for the umbilical area in order to ensure that the umbilical region does not completely closed up and heals internally. For that reason I believe that she could do this at this point but I would want to use a little bit of mupirocin on the plug that she is make sure to clean it well between times that she puts this in. She voiced understanding. 05-10-2023 upon evaluation today patient appears to be doing well currently in regard to her wound which actually appears to be completely healed. I am extremely happy for her knows she is extremely happy as well. 06-07-2023 upon evaluation today patient is here for reevaluation and its actually been 1 month although since I last saw her. At that point she had healed in regard to the area of the umbilical region. Unfortunately in the central area where the abdominis rectus muscles had separated and there was a hematoma previous back in May on the 15th 2024 this was after she lifted a child and felt something pop in this area following her surgery. Nonetheless I think this area of hematoma now is an opening that unfortunately became infected when she first noted this a little over  a week ago. She went to urgent care she was given doxycycline  and seems to be doing some better although is not completely resolved. Unfortunately there is quite a bit of undermining I was barely able to get a skinny probe in but once I did there was significant undermining underneath which makes me think that this is likely an abscess or seroma neither which were to be able to manage here in wound care due to the fact that I cannot get into the wound area. Electronic Signature(s) Signed: 06/07/2023 1:55:32 PM By: Prentice Docker, Rocio [phone] By: Dionne Ano [phone].pdf Page 2 of 6 Signed: 06/07/2023 1:55:32 Entered By: Allen Derry on 06/07/2023 13:55:31 -------------------------------------------------------------------------------- Physical Exam Details Patient Name: Date of Service: Haley Wyatt 06/07/2023 10:30 A M Medical Record Number: [id number] Patient Account Number: [id number] Date of Birth/Sex: Treating RN: 12-Jul-1976 (47 y.o. F) Primary Care Provider: Dulce Sellar Other Clinician: Referring Provider: Treating Provider/Extender: Milbert Coulter in Treatment: 8 Constitutional Well-nourished and well-hydrated in no acute distress. Respiratory normal breathing without difficulty. Psychiatric this patient is able to make decisions and demonstrates good insight into disease process. Alert and Oriented x 3. pleasant and cooperative. Notes Upon inspection patient again has significant undermining I was barely able to get a skinny probe into the opening and once I did this was a significant region of undermining that is noted. I think this is can require surgery to correct otherwise I think she is getting consistently have an area that was still appear then build up with fluid underneath and then will burst open again which is good and not be the right way to go. I think that with what we are seeing right now she really needs to see a surgeon ASAP. Electronic  Signature(s) Signed: 06/07/2023 1:56:11 PM By: Allen Derry PA-C Entered By: Allen Derry on 06/07/2023 13:56:11 -------------------------------------------------------------------------------- Physician Orders Details Patient Name: Date of Service: [name], [name] 06/07/2023 10:30 A M Medical Record Number: [id number] Patient Account Number: [id number] Date of Birth/Sex: Treating RN: August 29, 1976 (47 y.o. Arta Silence Primary Care Provider: Dulce Sellar Other Clinician: Referring Provider: Treating Provider/Extender: Milbert Coulter in Treatment: 8 Verbal / Phone Orders: No Diagnosis Coding ICD-10 Coding Code Description T81.31XA Disruption of external operation (surgical) wound, not elsewhere classified, initial encounter L98.492 Non-pressure chronic ulcer of skin of other sites with fat layer exposed Follow-up Appointments ppointment in 1 week. - 06/14/2023 745 room 8 [facility] Wednesday. Return A Other: - Stop the oral antibiotics doxycycline and start cipro. Dr. Thomos Lemons office will call you once they receive the referral if they accept you. If you develop fever, chills, nausea, and vomit go to the emergency department. Wound Treatment Wound #1R - Umbilicus Cleanser: Soap and Water 1 x Per Day/30 Days Discharge Instructions: May shower and wash wound with dial antibacterial soap and water prior to dressing change. TARRICONE, Uliana ([id number]) [id number] Page 3 of 6 Secondary Dressing: Zetuvit Plus Silicone Border Dressing 4x4 (in/in) 1 x Per Day/30 Days Discharge Instructions: Apply silicone border or bordered gauze. Consults Plastic Surgery- Dr. Arita Miss - Dr. Arita Miss phone number [phone]; Referral related to abscess to abominal midline-umbilicus area where previous noted hematoma and externally healed but internally is not. - (ICD10 T81.31XA - Disruption of external operation (surgical) wound, not elsewhere classified,  initial encounter) Patient Medications llergies: No Known Drug Allergies A Notifications Medication Indication Start End 06/07/2023 Cipro DOSE 1 - oral 500 mg  tablet - 1 tablet oral twice a day x 14 days Electronic Signature(s) Signed: 06/07/2023 1:57:27 PM By: Allen Derry PA-C Entered By: Allen Derry on 06/07/2023 13:57:26 Prescription 06/07/2023 -------------------------------------------------------------------------------- Sima Matas PA Patient Name: Provider: 06-14-1976 [id number] Date of Birth: NPI#Sherral Hammers Sex: DEA #: [phone] Phone #: License #: UPN: Patient Address: [address] Eligha Bridegroom [facility] [place], [place] [address] [address] [place], [place] [address] [phone] Allergies No Known Drug Allergies Provider's Orders Plastic Surgery- Dr. Arita Miss - ICD10: T81.31XA - Dr. Arita Miss phone number [phone]; Referral related to abscess to abominal midline-umbilicus area where previous noted hematoma and externally healed but internally is not. Hand Signature: Date(s): Electronic Signature(s) Unsigned Entered By: Allen Derry on 06/07/2023 13:57:27 -------------------------------------------------------------------------------- Problem List Details Patient Name: Date of Service: Haley Wyatt 06/07/2023 10:30 A M Medical Record Number: [id number] Patient Account Number: [id number] HOLLETT, Keena ([id number]) [id number] Page 4 of 6 Date of Birth/Sex: Treating RN: 1976-09-02 (47 y.o. F) Primary Care Provider: Other Clinician: Dulce Sellar Referring Provider: Treating Provider/Extender: Milbert Coulter in Treatment: 8 Active Problems ICD-10 Encounter Code Description Active Date MDM Diagnosis T81.31XA Disruption of external operation (surgical) wound, not elsewhere classified, 04/12/2023 No Yes initial  encounter L98.492 Non-pressure chronic ulcer of skin of other sites with fat layer exposed 04/12/2023 No Yes Inactive Problems Resolved Problems Electronic Signature(s) Signed: 06/07/2023 12:01:19 PM By: Allen Derry PA-C Entered By: Allen Derry on 06/07/2023 12:01:19 -------------------------------------------------------------------------------- Progress Note Details Patient Name: Date of Service: [name], [name] 06/07/2023 10:30 A M Medical Record Number: [id number] Patient Account Number: [id number] Date of Birth/Sex: Treating RN: 1976/04/27 (47 y.o. F) Primary Care Provider: Dulce Sellar Other Clinician: Referring Provider: Treating Provider/Extender: Milbert Coulter in Treatment: 8 Subjective Chief Complaint Information obtained from Patient Umbilical surgical ulcer s/p abdominoplasty History of Present Illness (HPI) 04-12-2023 upon evaluation today patient appears to be doing poorly today with regard to her wound. She subsequently has an umbilical ulcer which is actually secondary to surgery. She had a extensive tummy tuck which was actually performed abroad and subsequently they did an extremely good job with that which is awesome. With that being said she unfortunately has several sutures that are actually embedded in the umbilicus region which she was not able to get anyone to take out at her previous clinic where she has been being seen. With that being said other than the swollen area everything else has been healing nicely. Nonetheless I do believe this is actually somewhat deep-rooted beyond where just the sutures need to be removed to be honest. 04-26-2023 upon evaluation today patient appears to be doing well currently in regard to her umbilical ulcer. This is a surgical wound and does seem to be doing much better. Fortunately I do not see any evidence of active infection locally nor systemically which is great news. No fevers, chills, nausea,  vomiting, or diarrhea. 05-03-2023 upon evaluation patient appears to be doing excellent in regard to her wound which is actually measuring significantly smaller compared to even last week's evaluation. In fact I think the majority of the sidewalls are actually healed there is just a very tiny area in the base of the wound that might still be open. Nonetheless she wants to use the plug for the umbilical area in order to ensure that the umbilical region does not completely closed up and heals internally. For that  reason I believe that she could do this at this point but I would want to use a little bit of mupirocin on the plug that she is make sure to clean it well between times that she puts this in. She voiced understanding. 05-10-2023 upon evaluation today patient appears to be doing well currently in regard to her wound which actually appears to be completely healed. I am extremely happy for her knows she is extremely happy as well. 06-07-2023 upon evaluation today patient is here for reevaluation and its actually been 1 month although since I last saw her. At that point she had healed in regard to the area of the umbilical region. Unfortunately in the central area where the abdominis rectus muscles had separated and there was a hematoma previous back in May on the 15th 2024 this was after she lifted a child and felt something pop in this area following her surgery. Nonetheless I think this area of hematoma now is an opening that unfortunately became infected when she first noted this a little over a week ago. She went to urgent care she was given doxycycline and seems to be doing some better although is not completely resolved. Unfortunately there is quite a bit of undermining I was barely able to get a skinny probe in but once I did there was significant undermining underneath which makes me think that this is likely an abscess or seroma neither which were to be able to manage here in wound care due  to the fact that I cannot get into the wound area. QUINBY, Latreshia ([id number]) [id number] Page 5 of 6 Objective Constitutional Well-nourished and well-hydrated in no acute distress. Vitals Time Taken: 11:47 AM, Temperature: 98.6 F, Pulse: 80 bpm, Respiratory Rate: 18 breaths/min, Blood Pressure: 143/87 mmHg. Respiratory normal breathing without difficulty. Psychiatric this patient is able to make decisions and demonstrates good insight into disease process. Alert and Oriented x 3. pleasant and cooperative. General Notes: Upon inspection patient again has significant undermining I was barely able to get a skinny probe into the opening and once I did this was a significant region of undermining that is noted. I think this is can require surgery to correct otherwise I think she is getting consistently have an area that was still appear then build up with fluid underneath and then will burst open again which is good and not be the right way to go. I think that with what we are seeing right now she really needs to see a surgeon ASAP. Integumentary (Hair, Skin) Wound #1R status is Open. Original cause of wound was Surgical Injury. The date acquired was: 02/17/2023. The wound has been in treatment 8 weeks. The wound is located on the Umbilicus. The wound measures 0.1cm length x 0.1cm width x 3cm depth; 0.008cm^2 area and 0.024cm^3 volume. There is no tunneling noted, however, there is undermining starting at 12:00 and ending at 12:00 with a maximum distance of 2cm. There is a medium amount of purulent drainage noted. The wound margin is distinct with the outline attached to the wound base. There is no granulation within the wound bed. There is no necrotic tissue within the wound bed. The periwound skin appearance had no abnormalities noted for moisture. The periwound skin appearance exhibited: Excoriation. The periwound skin appearance did not exhibit: Callus, Crepitus,  Induration, Rash, Scarring, Atrophie Blanche, Cyanosis, Ecchymosis, Hemosiderin Staining, Mottled, Pallor, Rubor, Erythema. Periwound temperature was noted as No Abnormality. Assessment Active Problems ICD-10 Disruption of external operation (surgical) wound,  not elsewhere classified, initial encounter Non-pressure chronic ulcer of skin of other sites with fat layer exposed Plan Follow-up Appointments: Return Appointment in 1 week. - 06/14/2023 745 room 8 [facility] Wednesday. Other: - Stop the oral antibiotics doxycycline and start cipro. Dr. Thomos Lemons office will call you once they receive the referral if they accept you. If you develop fever, chills, nausea, and vomit go to the emergency department. Consults ordered were: Plastic Surgery- Dr. Arita Miss - Dr. Arita Miss phone number [phone]; Referral related to abscess to abominal midline-umbilicus area where previous noted hematoma and externally healed but internally is not. The following medication(s) was prescribed: Cipro oral 500 mg tablet 1 1 tablet oral twice a day x 14 days starting 06/07/2023 WOUND #1R: - Umbilicus Wound Laterality: Cleanser: Soap and Water 1 x Per Day/30 Days Discharge Instructions: May shower and wash wound with dial antibacterial soap and water prior to dressing change. Secondary Dressing: Zetuvit Plus Silicone Border Dressing 4x4 (in/in) 1 x Per Day/30 Days Discharge Instructions: Apply silicone border or bordered gauze. 1. Based on what I am seeing I do believe that the patient would benefit from a continuation of therapy with regard to the cover dressing just to catch any drainage there is really no way I can get any medicine into the wound however. 2. I am the recommend at this point that we have the patient follow-up with a surgeon or go to see about Dr. Arita Miss as a plastic surgeon although if this does not work out I really think that the patient may need to be seen at the [facility] in order to have further  evaluation and treatment. If anything gets worse or is not improving I think she is get a need to go to the ER ASAP. This is can be the only way to make something happen quickly. 4. I am going to place her back on Cipro she has done well with this in the past when she had an infection and it was cultured right now there is really no way for me to be able to culture this, placed on it for 2 weeks and then we will see where things stand at that point. Electronic Signature(s) Signed: 06/07/2023 1:57:46 PM By: Prentice Docker, Jalea [phone] By: Dionne Ano [phone].pdf Page 6 of 6 Signed: 06/07/2023 1:57:46 Entered By: Allen Derry on 06/07/2023 13:57:46 -------------------------------------------------------------------------------- SuperBill Details Patient Name: Date of Service: Haley Wyatt 06/07/2023 Medical Record Number: [id number] Patient Account Number: [id number] Date of Birth/Sex: Treating RN: 1976/05/01 (47 y.o. [name], Yvonne Kendall Primary Care Provider: Dulce Sellar Other Clinician: Referring Provider: Treating Provider/Extender: Milbert Coulter in Treatment: 8 Diagnosis Coding ICD-10 Codes Code Description T81.31XA Disruption of external operation (surgical) wound, not elsewhere classified, initial encounter L98.492 Non-pressure chronic ulcer of skin of other sites with fat layer exposed Facility Procedures : CPT4 Code: [id number] Description: 99213 - WOUND CARE VISIT-LEV 3 EST PT Modifier: Quantity: 1 Physician Procedures : CPT4 Code Description Modifier [id number] 99214 - WC PHYS LEVEL 4 - EST PT ICD-10 Diagnosis Description T81.31XA Disruption of external operation (surgical) wound, not elsewhere classified, initial encounter L98.492 Non-pressure chronic ulcer of skin of  other sites with fat layer exposed Quantity: 1 Electronic Signature(s) Signed: 06/07/2023 1:58:23 PM By: Allen Derry  PA-C Entered By: Allen Derry on 06/07/2023 13:58:23

## 2023-06-09 NOTE — Progress Notes (Signed)
Wyatt, Haley ([id number]) [id number] Page 1 of 7 Visit Report for 06/07/2023 Arrival Information Details Patient Name: Date of Service: Haley Wyatt 06/07/2023 10:30 A M Medical Record Number: [id number] Patient Account Number: [id number] Date of Birth/Sex: Treating RN: [birth year] (47 y.o. F) Primary Care Zaynah Chawla: Dulce Sellar Other Clinician: Referring Lariah Fleer: Treating Danell Verno/Extender: Milbert Coulter in Treatment: 8 Visit Information History Since Last Visit Added or deleted any medications: No Patient Arrived: Ambulatory Any new allergies or adverse reactions: No Arrival Time: 11:38 Had a fall or experienced change in No Accompanied By: self activities of daily living that may affect Transfer Assistance: None risk of falls: Patient Identification Verified: Yes Signs or symptoms of abuse/neglect since last visito No Secondary Verification Process Completed: Yes Hospitalized since last visit: No Patient Requires Transmission-Based Precautions: No Implantable device outside of the clinic excluding No Patient Has Alerts: No cellular tissue based products placed in the center since last visit: Has Dressing in Place as Prescribed: Yes Pain Present Now: No Electronic Signature(s) Signed: 06/09/2023 12:35:38 PM By: Thayer Dallas Entered By: Thayer Dallas on 06/07/2023 11:42:46 -------------------------------------------------------------------------------- Clinic Level of Care Assessment Details Patient Name: Date of Service: Haley Wyatt 06/07/2023 10:30 A M Medical Record Number: [id number] Patient Account Number: [id number] Date of Birth/Sex: Treating RN: May 31, 1976 (47 y.o. Haley Wyatt, Haley.Wyatt Primary Care [name]: Dulce Sellar Other Clinician: Referring Haley Wyatt: Treating Haley Wyatt/Extender: Milbert Coulter in Treatment: 8 Clinic Level of Care Assessment Items TOOL 4  Quantity Score X- 1 0 Use when only an EandM is performed on FOLLOW-UP visit ASSESSMENTS - Nursing Assessment / Reassessment X- 1 10 Reassessment of Co-morbidities (includes updates in patient status) X- 1 5 Reassessment of Adherence to Treatment Plan ASSESSMENTS - Wound and Skin A ssessment / Reassessment X - Simple Wound Assessment / Reassessment - one wound 1 5 []  - 0 Complex Wound Assessment / Reassessment - multiple wounds X- 1 10 Dermatologic / Skin Assessment (not related to wound area) ASSESSMENTS - Focused Assessment []  - 0 Circumferential Edema Measurements - multi extremities []  - 0 Nutritional Assessment / Counseling / Intervention Wyatt, Haley ([id number]) [id number] Page 2 of 7 []  - 0 Lower Extremity Assessment (monofilament, tuning fork, pulses) []  - 0 Peripheral Arterial Disease Assessment (using hand held doppler) ASSESSMENTS - Ostomy and/or Continence Assessment and Care []  - 0 Incontinence Assessment and Management []  - 0 Ostomy Care Assessment and Management (repouching, etc.) PROCESS - Coordination of Care X - Simple Patient / Family Education for ongoing care 1 15 []  - 0 Complex (extensive) Patient / Family Education for ongoing care X- 1 10 Staff obtains Chiropractor, Records, T Results / Process Orders est []  - 0 Staff telephones HHA, Nursing Homes / Clarify orders / etc []  - 0 Routine Transfer to another Facility (non-emergent condition) []  - 0 Routine Hospital Admission (non-emergent condition) []  - 0 New Admissions / Manufacturing engineer / Ordering NPWT Apligraf, etc. , []  - 0 Emergency Hospital Admission (emergent condition) X- 1 10 Simple Discharge Coordination []  - 0 Complex (extensive) Discharge Coordination PROCESS - Special Needs []  - 0 Pediatric / Minor Patient Management []  - 0 Isolation Patient Management []  - 0 Hearing / Language / Visual special needs []  - 0 Assessment of Community  assistance (transportation, D/C planning, etc.) []  - 0 Additional assistance / Altered mentation []  - 0 Support Surface(s) Assessment (bed, cushion, seat, etc.) INTERVENTIONS - Wound Cleansing / Measurement X - Simple Wound Cleansing - one  wound 1 5 []  - 0 Complex Wound Cleansing - multiple wounds X- 1 5 Wound Imaging (photographs - any number of wounds) []  - 0 Wound Tracing (instead of photographs) X- 1 5 Simple Wound Measurement - one wound []  - 0 Complex Wound Measurement - multiple wounds INTERVENTIONS - Wound Dressings X - Small Wound Dressing one or multiple wounds 1 10 []  - 0 Medium Wound Dressing one or multiple wounds []  - 0 Large Wound Dressing one or multiple wounds []  - 0 Application of Medications - topical []  - 0 Application of Medications - injection INTERVENTIONS - Miscellaneous []  - 0 External ear exam []  - 0 Specimen Collection (cultures, biopsies, blood, body fluids, etc.) []  - 0 Specimen(s) / Culture(s) sent or taken to Lab for analysis []  - 0 Patient Transfer (multiple staff / Nurse, adult / Similar devices) []  - 0 Simple Staple / Suture removal (25 or less) []  - 0 Complex Staple / Suture removal (26 or more) []  - 0 Hypo / Hyperglycemic Management (close monitor of Blood Glucose) Wyatt, Haley ([id number]) [id number] Page 3 of 7 []  - 0 Ankle / Brachial Index (ABI) - do not check if billed separately X- 1 5 Vital Signs Has the patient been seen at the hospital within the last three years: Yes Total Score: 95 Level Of Care: New/Established - Level 3 Electronic Signature(s) Signed: 06/07/2023 6:05:41 PM By: Shawn Stall RN, BSN Entered By: Shawn Stall on 06/07/2023 12:20:36 -------------------------------------------------------------------------------- Encounter Discharge Information Details Patient Name: Date of Service: Haley Wyatt, [name] 06/07/2023 10:30 A M Medical Record Number: [id number] Patient Account  Number: [id number] Date of Birth/Sex: Treating RN: 10/10/1976 (47 y.o. Haley Wyatt Primary Care Odella Appelhans: Dulce Sellar Other Clinician: Referring Tameya Kuznia: Treating Chenel Wernli/Extender: Milbert Coulter in Treatment: 8 Encounter Discharge Information Items Discharge Condition: Stable Ambulatory Status: Ambulatory Discharge Destination: Home Transportation: Private Auto Accompanied By: self Schedule Follow-up Appointment: Yes Clinical Summary of Care: Electronic Signature(s) Signed: 06/07/2023 6:05:41 PM By: Shawn Stall RN, BSN Entered By: Shawn Stall on 06/07/2023 12:21:03 -------------------------------------------------------------------------------- Lower Extremity Assessment Details Patient Name: Date of Service: Haley [place], [name] 06/07/2023 10:30 A M Medical Record Number: [id number] Patient Account Number: [id number] Date of Birth/Sex: Treating RN: 1976-07-12 (47 y.o. F) Primary Care Zaryan Yakubov: Dulce Sellar Other Clinician: Referring Jaman Aro: Treating Tykeem Lanzer/Extender: Milbert Coulter in Treatment: 8 Electronic Signature(s) Signed: 06/09/2023 12:35:38 PM By: Thayer Dallas Entered By: Thayer Dallas on 06/07/2023 11:44:43 -------------------------------------------------------------------------------- Multi-Disciplinary Care Plan Details Patient Name: Date of Service: Haley Wyatt, [name] 06/07/2023 10:30 A M Medical Record Number: [id number] Patient Account Number: [id number] BREAULT, Maliah ([id number]) [id number] Page 4 of 7 Date of Birth/Sex: Treating RN: Sep 06, 1976 (47 y.o. Haley Wyatt Primary Care Raylene Carmickle: Other Clinician: Dulce Sellar Referring Nael Petrosyan: Treating Zamia Tyminski/Extender: Milbert Coulter in Treatment: 8 Active Inactive Soft Tissue Infection Nursing Diagnoses: Impaired tissue integrity Goals: Patient will remain free of  wound infection Date Initiated: 06/07/2023 Target Resolution Date: 06/29/2023 Goal Status: Active Interventions: Assess signs and symptoms of infection every visit Treatment Activities: Culture and sensitivity : 06/07/2023 Notes: Electronic Signature(s) Signed: 06/07/2023 6:05:41 PM By: Shawn Stall RN, BSN Entered By: Shawn Stall on 06/07/2023 12:08:43 -------------------------------------------------------------------------------- Pain Assessment Details Patient Name: Date of Service: Haley [place], [name] 06/07/2023 10:30 A M Medical Record Number: [id number] Patient Account Number: [id number] Date of Birth/Sex: Treating RN: April 27, 1976 (47 y.o. F) Primary Care Shakela Donati: Dulce Sellar Other Clinician: Referring Zaiyden Strozier: Treating Beckham Capistran/Extender: Linwood Dibbles,  Elsworth Soho, Stephanie Weeks in Treatment: 8 Active Problems Location of Pain Severity and Description of Pain Patient Has Paino No Site Locations Pain Management and Medication Current Pain ManagementPaddy Panganiban, Gayanne ([id number]) [id number] Page 5 of 7 Electronic Signature(s) Signed: 06/09/2023 12:35:38 PM By: Thayer Dallas Entered By: Thayer Dallas on 06/07/2023 11:44:13 -------------------------------------------------------------------------------- Patient/Caregiver Education Details Patient Name: Date of Service: Haley [place]att, [name] 8/14/2024andnbsp10:30 A M Medical Record Number: [id number] Patient Account Number: [id number] Date of Birth/Gender: Treating RN: Jun 06, 1976 (47 y.o. Haley Wyatt Primary Care Physician: Dulce Sellar Other Clinician: Referring Physician: Treating Physician/Extender: Milbert Coulter in Treatment: 8 Education Assessment Education Provided To: Patient Education Topics Provided Wound/Skin Impairment: Handouts: Caring for Your Ulcer Methods: Explain/Verbal Responses: Reinforcements needed Electronic  Signature(s) Signed: 06/07/2023 6:05:41 PM By: Shawn Stall RN, BSN Entered By: Shawn Stall on 06/07/2023 12:08:59 -------------------------------------------------------------------------------- Wound Assessment Details Patient Name: Date of Service: Haley [place], [name] 06/07/2023 10:30 A M Medical Record Number: [id number] Patient Account Number: [id number] Date of Birth/Sex: Treating RN: 07/16/1976 (47 y.o. Haley Wyatt, Yvonne Kendall Primary Care Holmes Hays: Dulce Sellar Other Clinician: Referring Gelisa Tieken: Treating Lindy Garczynski/Extender: Milbert Coulter in Treatment: 8 Wound Status Wound Number: [id number] Primary Etiology: Abscess Wound Location: Umbilicus Wound Status: Open Wounding Event: Surgical Injury Comorbid History: Hypertension Date Acquired: 02/17/2023 Weeks Of Treatment: 8 Clustered Wound: No Wound Measurements Length: (cm) 0.1 Width: (cm) 0.1 Depth: (cm) 3 Area: (cm) 0.0 Volume: (cm) 0.0 Maese, Aradhya ([id number]) Wound Description Classification: Full Thickness Without Exposed Support Wound Margin: Distinct, outline attached Exudate Amount: Medium Exudate Type: Purulent Exudate Color: yellow, brown, green Foul Odor After Cleansing: Slough/Fibrino % Reduction in Area: 97.2% % Reduction in Volume: 97.6% Epithelialization: Large (67-100%) 08 Tunneling: No 24 Undermining: Yes Starting Position (o'clock): 12 Ending Position (o'clock): 12 Maximum Distance: (cm) 2 [id number] Page 6 of 7 Structures No No Wound Bed Granulation Amount: None Present (0%) Exposed Structure Necrotic Amount: None Present (0%) Fascia Exposed: No Fat Layer (Subcutaneous Tissue) Exposed: No Tendon Exposed: No Muscle Exposed: No Joint Exposed: No Bone Exposed: No Periwound Skin Texture Texture Color No Abnormalities Noted: No No Abnormalities Noted: No Callus: No Atrophie Blanche: No Crepitus: No Cyanosis: No Excoriation:  Yes Ecchymosis: No Induration: No Erythema: No Rash: No Hemosiderin Staining: No Scarring: No Mottled: No Pallor: No Moisture Rubor: No No Abnormalities Noted: Yes Temperature / Pain Temperature: No Abnormality Treatment Notes Wound #1R (Umbilicus) Cleanser Soap and Water Discharge Instruction: May shower and wash wound with dial antibacterial soap and water prior to dressing change. Peri-Wound Care Topical Primary Dressing Secondary Dressing Zetuvit Plus Silicone Border Dressing 4x4 (in/in) Discharge Instruction: Apply silicone border or bordered gauze. Secured With Compression Wrap Compression Stockings Facilities manager) Signed: 06/07/2023 6:05:41 PM By: Shawn Stall RN, BSN Entered By: Shawn Stall on 06/07/2023 12:05:51 -------------------------------------------------------------------------------- Vitals Details Patient Name: Date of Service: Haley [place], [name] 06/07/2023 10:30 A M Medical Record Number: [id number] Patient Account Number: [id number] Date of Birth/Sex: Treating RN: [birth year] (47 y.o. F) Primary Care Iann Rodier: Dulce Sellar Other Clinician: Referring Amyiah Gaba: Treating Elvie Palomo/Extender: Milbert Coulter in Treatment: 8 [place], Anastasya ([id number]) [id number] Page 7 of 7 Vital Signs Time Taken: 11:47 Temperature (F): 98.6 Pulse (bpm): 80 Respiratory Rate (breaths/min): 18 Blood Pressure (mmHg): 143/87 Reference Range: 80 - 120 mg / dl Electronic Signature(s) Signed: 06/09/2023 12:35:38 PM By: Thayer Dallas Entered By: Thayer Dallas on 06/07/2023 11:47:56

## 2023-06-14 ENCOUNTER — Encounter (HOSPITAL_BASED_OUTPATIENT_CLINIC_OR_DEPARTMENT_OTHER): Payer: 59 | Admitting: Physician Assistant

## 2023-06-14 DIAGNOSIS — L02216 Cutaneous abscess of umbilicus: Secondary | ICD-10-CM | POA: Diagnosis not present

## 2023-06-14 DIAGNOSIS — L98492 Non-pressure chronic ulcer of skin of other sites with fat layer exposed: Secondary | ICD-10-CM | POA: Diagnosis not present

## 2023-06-14 DIAGNOSIS — X58XXXA Exposure to other specified factors, initial encounter: Secondary | ICD-10-CM | POA: Diagnosis not present

## 2023-06-14 DIAGNOSIS — I10 Essential (primary) hypertension: Secondary | ICD-10-CM | POA: Diagnosis not present

## 2023-06-14 DIAGNOSIS — T8131XA Disruption of external operation (surgical) wound, not elsewhere classified, initial encounter: Secondary | ICD-10-CM | POA: Diagnosis not present

## 2023-06-14 NOTE — Progress Notes (Signed)
IM, Haley Wyatt ([id number]) [id number] Page 1 of 5 Visit Report for 06/14/2023 Chief Complaint Document Details Patient Name: Date of Service: Haley Wyatt 06/14/2023 7:45 A M Medical Record Number: [id number] Patient Account Number: [id number] Date of Birth/Sex: Treating RN: 02/08/1976 (47 y.o. F) Primary Care Provider: Dulce Sellar Other Clinician: Referring Provider: Treating Provider/Extender: Milbert Coulter in Treatment: 9 Information Obtained from: Patient Chief Complaint Umbilical surgical ulcer s/p abdominoplasty Electronic Signature(s) Signed: 06/14/2023 8:14:06 AM By: Allen Derry PA-C Entered By: Allen Derry on 06/14/2023 08:14:06 -------------------------------------------------------------------------------- HPI Details Patient Name: Date of Service: Haley Wyatt, [name] 06/14/2023 7:45 A M Medical Record Number: [id number] Patient Account Number: [id number] Date of Birth/Sex: Treating RN: 12/25/1975 (47 y.o. F) Primary Care Provider: Dulce Sellar Other Clinician: Referring Provider: Treating Provider/Extender: Milbert Coulter in Treatment: 9 History of Present Illness HPI Description: 04-12-2023 upon evaluation today patient appears to be doing poorly today with regard to her wound. She subsequently has an umbilical ulcer which is actually secondary to surgery. She had a extensive [place] and subsequently they did an extremely good job with that which is awesome. With that being said she unfortunately has several sutures that are actually embedded in the umbilicus region which she was not able to get anyone to take out at her previous clinic where she has been being seen. With that being said other than the swollen area everything else has been healing nicely. Nonetheless I do believe this is actually somewhat deep-rooted beyond where just the  sutures need to be removed to be honest. 04-26-2023 upon evaluation today patient appears to be doing well currently in regard to her umbilical ulcer. This is a surgical wound and does seem to be doing much better. Fortunately I do not see any evidence of active infection locally nor systemically which is great news. No fevers, chills, nausea, vomiting, or diarrhea. 05-03-2023 upon evaluation patient appears to be doing excellent in regard to her wound which is actually measuring significantly smaller compared to even last week's evaluation. In fact I think the majority of the sidewalls are actually healed there is just a very tiny area in the base of the wound that might still be open. Nonetheless she wants to use the plug for the umbilical area in order to ensure that the umbilical region does not completely closed up and heals internally. For that reason I believe that she could do this at this point but I would want to use a little bit of mupirocin on the plug that she is make sure to clean it well between times that she puts this in. She voiced understanding. 05-10-2023 upon evaluation today patient appears to be doing well currently in regard to her wound which actually appears to be completely healed. I am extremely happy for her knows she is extremely happy as well. 06-07-2023 upon evaluation today patient is here for reevaluation and its actually been 1 month although since I last saw her. At that point she had healed in regard to the area of the umbilical region. Unfortunately in the central area where the abdominis rectus muscles had separated and there was a hematoma previous back in May on the 15th 2024 this was after she lifted a child and felt something pop in this area following her surgery. Nonetheless I think this area of hematoma now is an opening that unfortunately became infected when she first noted this a little over  a week ago. She went to urgent care she was given doxycycline and  seems to be doing some better although is not completely resolved. Unfortunately there is quite a bit of undermining I was barely able to get a skinny probe in but once I did there was significant undermining underneath which makes me think that this is likely an abscess or seroma neither which were to be able to manage here in wound care due to the fact that I cannot get into the wound area. 06-14-2023 upon evaluation today patient appears to be doing well currently in regard to her wound from the standpoint of this not being worse but is also not better. She still has the pinpoint opening which we cannot even really packed anything into that goes down about 3.4 cm. Fortunately there does not appear to be any signs of infection at this time which is good news systemically though locally we have been treating her for an infection here. She does seem to be tolerating the dressing changes without complication. Wyatt, Haley ([id number]) [id number] Page 2 of 5 Electronic Signature(s) Signed: 06/14/2023 8:33:11 AM By: Allen Derry PA-C Entered By: Allen Derry on 06/14/2023 08:33:11 -------------------------------------------------------------------------------- Physical Exam Details Patient Name: Date of Service: Haley Wyatt 06/14/2023 7:45 A M Medical Record Number: [id number] Patient Account Number: [id number] Date of Birth/Sex: Treating RN: 05-17-1976 (47 y.o. F) Primary Care Provider: Dulce Sellar Other Clinician: Referring Provider: Treating Provider/Extender: Milbert Coulter in Treatment: 9 Constitutional Well-nourished and well-hydrated in no acute distress. Respiratory normal breathing without difficulty. Psychiatric this patient is able to make decisions and demonstrates good insight into disease process. Alert and Oriented x 3. pleasant and cooperative. Notes Upon inspection patient's wound bed actually showed signs of  poor healing. Again we have been using mupirocin topically and then putting the Band-Aid to catch drainage over top specifically a small Zetuvit. With that being said there really is not any way for [place] to get a type of packing this is I can barely even get the skinny probe down into the area. Electronic Signature(s) Signed: 06/14/2023 8:33:39 AM By: Allen Derry PA-C Entered By: Allen Derry on 06/14/2023 08:33:38 -------------------------------------------------------------------------------- Physician Orders Details Patient Name: Date of Service: Haley Wyatt, [name] 06/14/2023 7:45 A M Medical Record Number: [id number] Patient Account Number: [id number] Date of Birth/Sex: Treating RN: 11-24-1975 (47 y.o. Arta Silence Primary Care Provider: Dulce Sellar Other Clinician: Referring Provider: Treating Provider/Extender: Milbert Coulter in Treatment: 9 Verbal / Phone Orders: No Diagnosis Coding ICD-10 Coding Code Description T81.31XA Disruption of external operation (surgical) wound, not elsewhere classified, initial encounter L98.492 Non-pressure chronic ulcer of skin of other sites with fat layer exposed Follow-up Appointments ppointment in 2 weeks. Leonard Schwartz Wednesday 1030 room 7 overflow 06/28/2023 Return A ****Call and cancel this appt if you get into [facility]**** Other: - continue cipro. Call [facility] between now to Friday as they have the referral. Phone number provided to you up front. If you develop fever, chills, nausea, and vomit go to the emergency department. Wound Treatment Wound #1R Zahia Mcadory, Joyelle ([id number]) [id number] Page 3 of 5 Cleanser: Soap and Water 1 x Per Day/30 Days Discharge Instructions: May shower and wash wound with dial antibacterial soap and water prior to dressing change. Secondary Dressing: Zetuvit Plus Silicone Border Dressing 4x4 (in/in) 1 x Per Day/30 Days Discharge  Instructions: Apply silicone border or bordered gauze. Electronic Signature(s) Signed: 06/14/2023 9:46:07 AM  By: Allen Derry PA-C Signed: 06/14/2023 7:54:18 PM By: Shawn Stall RN, BSN Entered By: Shawn Stall on 06/14/2023 08:25:10 -------------------------------------------------------------------------------- Problem List Details Patient Name: Date of Service: Haley Wyatt, [name] 06/14/2023 7:45 A M Medical Record Number: [id number] Patient Account Number: [id number] Date of Birth/Sex: Treating RN: 19-Mar-1976 (47 y.o. [name], Yvonne Kendall Primary Care Provider: Dulce Sellar Other Clinician: Referring Provider: Treating Provider/Extender: Milbert Coulter in Treatment: 9 Active Problems ICD-10 Encounter Code Description Active Date MDM Diagnosis T81.31XA Disruption of external operation (surgical) wound, not elsewhere classified, 04/12/2023 No Yes initial encounter L98.492 Non-pressure chronic ulcer of skin of other sites with fat layer exposed 04/12/2023 No Yes Inactive Problems Resolved Problems Electronic Signature(s) Signed: 06/14/2023 8:13:59 AM By: Allen Derry PA-C Entered By: Allen Derry on 06/14/2023 08:13:59 -------------------------------------------------------------------------------- Progress Note Details Patient Name: Date of Service: Haley Wyatt, [name] 06/14/2023 7:45 A M Medical Record Number: [id number] Patient Account Number: [id number] Date of Birth/Sex: Treating RN: 12/06/1975 (47 y.o. F) Primary Care Provider: Dulce Sellar Other Clinician: Referring Provider: Treating Provider/Extender: Milbert Coulter in Treatment: [address] Subjective Chief Complaint [place], Annisten ([id number]) [id number] Page 4 of 5 Information obtained from Patient Umbilical surgical ulcer s/p abdominoplasty History of Present Illness (HPI) 04-12-2023 upon evaluation today patient appears to be doing poorly  today with regard to her wound. She subsequently has an umbilical ulcer which is actually secondary to surgery. She had a extensive tummy tuck which was actually performed abroad and subsequently they did an extremely good job with that which is awesome. With that being said she unfortunately has several sutures that are actually embedded in the umbilicus region which she was not able to get anyone to take out at her previous clinic where she has been being seen. With that being said other than the swollen area everything else has been healing nicely. Nonetheless I do believe this is actually somewhat deep-rooted beyond where just the sutures need to be removed to be honest. 04-26-2023 upon evaluation today patient appears to be doing well currently in regard to her umbilical ulcer. This is a surgical wound and does seem to be doing much better. Fortunately I do not see any evidence of active infection locally nor systemically which is great news. No fevers, chills, nausea, vomiting, or diarrhea. 05-03-2023 upon evaluation patient appears to be doing excellent in regard to her wound which is actually measuring significantly smaller compared to even last week's evaluation. In fact I think the majority of the sidewalls are actually healed there is just a very tiny area in the base of the wound that might still be open. Nonetheless she wants to use the plug for the umbilical area in order to ensure that the umbilical region does not completely closed up and heals internally. For that reason I believe that she could do this at this point but I would want to use a little bit of mupirocin on the plug that she is make sure to clean it well between times that she puts this in. She voiced understanding. 05-10-2023 upon evaluation today patient appears to be doing well currently in regard to her wound which actually appears to be completely healed. I am extremely happy for her knows she is extremely happy as  well. 06-07-2023 upon evaluation today patient is here for reevaluation and its actually been 1 month although since I last saw her. At that point she had healed in regard to the area of the umbilical region.  Unfortunately in the central area where the abdominis rectus muscles had separated and there was a hematoma previous back in May on the 15th 2024 this was after she lifted a child and felt something pop in this area following her surgery. Nonetheless I think this area of hematoma now is an opening that unfortunately became infected when she first noted this a little over a week ago. She went to urgent care she was given doxycycline and seems to be doing some better although is not completely resolved. Unfortunately there is quite a bit of undermining I was barely able to get a skinny probe in but once I did there was significant undermining underneath which makes me think that this is likely an abscess or seroma neither which were to be able to manage here in wound care due to the fact that I cannot get into the wound area. 06-14-2023 upon evaluation today patient appears to be doing well currently in regard to her wound from the standpoint of this not being worse but is also not better. She still has the pinpoint opening which we cannot even really packed anything into that goes down about 3.4 cm. Fortunately there does not appear to be any signs of infection at this time which is good news systemically though locally we have been treating her for an infection here. She does seem to be tolerating the dressing changes without complication. Objective Constitutional Well-nourished and well-hydrated in no acute distress. Vitals Time Taken: 8:00 AM, Temperature: 98.4 F, Pulse: 96 bpm, Respiratory Rate: 20 breaths/min, Blood Pressure: 124/85 mmHg. Respiratory normal breathing without difficulty. Psychiatric this patient is able to make decisions and demonstrates good insight into disease process.  Alert and Oriented x 3. pleasant and cooperative. General Notes: Upon inspection patient's wound bed actually showed signs of poor healing. Again we have been using mupirocin topically and then putting the Band-Aid to catch drainage over top specifically a small Zetuvit. With that being said there really is not any way for [place] to get a type of packing this is I can barely even get the skinny probe down into the area. Integumentary (Hair, Skin) Wound #1R status is Open. Original cause of wound was Surgical Injury. The date acquired was: 02/17/2023. The wound has been in treatment 9 weeks. The wound is located on the Umbilicus. The wound measures 0.1cm length x 0.1cm width x 3.4cm depth; 0.008cm^2 area and 0.027cm^3 volume. There is no tunneling or undermining noted. There is a medium amount of purulent drainage noted. The wound margin is distinct with the outline attached to the wound base. There is no granulation within the wound bed. There is no necrotic tissue within the wound bed. The periwound skin appearance had no abnormalities noted for moisture. The periwound skin appearance exhibited: Excoriation. The periwound skin appearance did not exhibit: Callus, Crepitus, Induration, Rash, Scarring, Atrophie Blanche, Cyanosis, Ecchymosis, Hemosiderin Staining, Mottled, Pallor, Rubor, Erythema. Periwound temperature was noted as No Abnormality. Assessment Active Problems ICD-10 Disruption of external operation (surgical) wound, not elsewhere classified, initial encounter Non-pressure chronic ulcer of skin of other sites with fat layer exposed Plan Caya, Tanny ([id number]) [phone].pdf Page 5 of 5 Follow-up Appointments: Return Appointment in 2 weeks. Leonard Schwartz Wednesday 1030 room 7 overflow 06/28/2023 ****Call and cancel this appt if you get into [facility]**** Other: - continue cipro. Call [facility] between now to Friday as they have the referral. Phone number  provided to you up front. If you develop fever, chills, nausea, and  vomit go to the emergency department. WOUND #1R: - Umbilicus Wound Laterality: Cleanser: Soap and Water 1 x Per Day/30 Days Discharge Instructions: May shower and wash wound with dial antibacterial soap and water prior to dressing change. Secondary Dressing: Zetuvit Plus Silicone Border Dressing 4x4 (in/in) 1 x Per Day/30 Days Discharge Instructions: Apply silicone border or bordered gauze. 1. I do believe the patient likely is going to need to see plastic surgery I discussed that with her as well. 2. I am going to recommend as well that the patient should continue to monitor for any signs of infection or worsening. Office if she develops any infection she should go to the ER for further evaluation and treatment right now she is on antibiotics still from what I gave her. 3. Will also give her the information for plastic surgery at [facility]. We have sent the referral and she just needs to call to get this scheduled. We will see patient back for reevaluation in 1 week here in the clinic. If anything worsens or changes patient will contact our office for additional recommendations. Electronic Signature(s) Signed: 06/14/2023 8:34:19 AM By: Allen Derry PA-C Entered By: Allen Derry on 06/14/2023 08:34:19 -------------------------------------------------------------------------------- SuperBill Details Patient Name: Date of Service: Haley Wyatt, [name] 06/14/2023 Medical Record Number: [id number] Patient Account Number: [id number] Date of Birth/Sex: Treating RN: 06/16/1976 (47 y.o. [name], Yvonne Kendall Primary Care Provider: Dulce Sellar Other Clinician: Referring Provider: Treating Provider/Extender: Milbert Coulter in Treatment: 9 Diagnosis Coding ICD-10 Codes Code Description T81.31XA Disruption of external operation (surgical) wound, not elsewhere classified, initial encounter L98.492 Non-pressure  chronic ulcer of skin of other sites with fat layer exposed Facility Procedures : CPT4 Code: [id number] Description: 99213 - WOUND CARE VISIT-LEV 3 EST PT Modifier: Quantity: 1 Physician Procedures : CPT4 Code Description Modifier [id number] - WC PHYS LEVEL 3 - EST PT ICD-10 Diagnosis Description T81.31XA Disruption of external operation (surgical) wound, not elsewhere classified, initial encounter L98.492 Non-pressure chronic ulcer of skin of  other sites with fat layer exposed Quantity: 1 Electronic Signature(s) Signed: 06/14/2023 8:36:01 AM By: Allen Derry PA-C Entered By: Allen Derry on 06/14/2023 08:36:01

## 2023-06-15 NOTE — Progress Notes (Signed)
SOTAK, Keitha ([id number]) [id number] Page 1 of 7 Visit Report for 06/14/2023 Arrival Information Details Patient Name: Date of Service: Haley Wyatt 06/14/2023 7:45 A M Medical Record Number: [id number] Patient Account Number: [id number] Date of Birth/Sex: Treating RN: October 26, 1975 (47 y.o. [name], Millard.Loa Primary Care Hudsyn Champine: Dulce Sellar Other Clinician: Referring Andranik Jeune: Treating Latacha Texeira/Extender: Milbert Coulter in Treatment: 9 Visit Information History Since Last Visit Added or deleted any medications: No Patient Arrived: Ambulatory Any new allergies or adverse reactions: No Arrival Time: 07:50 Had a fall or experienced change in No Accompanied By: self activities of daily living that may affect Transfer Assistance: None risk of falls: Patient Identification Verified: Yes Signs or symptoms of abuse/neglect since last visito No Secondary Verification Process Completed: Yes Hospitalized since last visit: No Patient Requires Transmission-Based Precautions: No Implantable device outside of the clinic excluding No Patient Has Alerts: No cellular tissue based products placed in the center since last visit: Has Dressing in Place as Prescribed: Yes Pain Present Now: No Electronic Signature(s) Signed: 06/14/2023 7:54:18 PM By: Shawn Stall RN, BSN Entered By: Shawn Stall on 06/14/2023 05:01:57 -------------------------------------------------------------------------------- Clinic Level of Care Assessment Details Patient Name: Date of Service: Haley Wyatt 06/14/2023 7:45 A M Medical Record Number: [id number] Patient Account Number: [id number] Date of Birth/Sex: Treating RN: April 29, 1976 (47 y.o. [name], Millard.Loa Primary Care Keyna Blizard: Dulce Sellar Other Clinician: Referring Denetta Fei: Treating Tashae Inda/Extender: Milbert Coulter in Treatment: 9 Clinic Level of Care Assessment  Items TOOL 4 Quantity Score X- 1 0 Use when only an EandM is performed on FOLLOW-UP visit ASSESSMENTS - Nursing Assessment / Reassessment X- 1 10 Reassessment of Co-morbidities (includes updates in patient status) X- 1 5 Reassessment of Adherence to Treatment Plan ASSESSMENTS - Wound and Skin A ssessment / Reassessment X - Simple Wound Assessment / Reassessment - one wound 1 5 []  - 0 Complex Wound Assessment / Reassessment - multiple wounds []  - 0 Dermatologic / Skin Assessment (not related to wound area) ASSESSMENTS - Focused Assessment []  - 0 Circumferential Edema Measurements - multi extremities []  - 0 Nutritional Assessment / Counseling / Intervention Shilling, Kolleen ([id number]) [phone].pdf Page 2 of 7 []  - 0 Lower Extremity Assessment (monofilament, tuning fork, pulses) []  - 0 Peripheral Arterial Disease Assessment (using hand held doppler) ASSESSMENTS - Ostomy and/or Continence Assessment and Care []  - 0 Incontinence Assessment and Management []  - 0 Ostomy Care Assessment and Management (repouching, etc.) PROCESS - Coordination of Care X - Simple Patient / Family Education for ongoing care 1 15 []  - 0 Complex (extensive) Patient / Family Education for ongoing care X- 1 10 Staff obtains Chiropractor, Records, T Results / Process Orders est []  - 0 Staff telephones HHA, Nursing Homes / Clarify orders / etc []  - 0 Routine Transfer to another Facility (non-emergent condition) []  - 0 Routine Hospital Admission (non-emergent condition) []  - 0 New Admissions / Manufacturing engineer / Ordering NPWT Apligraf, etc. , []  - 0 Emergency Hospital Admission (emergent condition) X- 1 10 Simple Discharge Coordination []  - 0 Complex (extensive) Discharge Coordination PROCESS - Special Needs []  - 0 Pediatric / Minor Patient Management []  - 0 Isolation Patient Management []  - 0 Hearing / Language / Visual special needs []  - 0 Assessment of  Community assistance (transportation, D/C planning, etc.) []  - 0 Additional assistance / Altered mentation []  - 0 Support Surface(s) Assessment (bed, cushion, seat, etc.) INTERVENTIONS - Wound Cleansing / Measurement X - Simple  Wound Cleansing - one wound 1 5 []  - 0 Complex Wound Cleansing - multiple wounds X- 1 5 Wound Imaging (photographs - any number of wounds) []  - 0 Wound Tracing (instead of photographs) X- 1 5 Simple Wound Measurement - one wound []  - 0 Complex Wound Measurement - multiple wounds INTERVENTIONS - Wound Dressings X - Small Wound Dressing one or multiple wounds 1 10 []  - 0 Medium Wound Dressing one or multiple wounds []  - 0 Large Wound Dressing one or multiple wounds []  - 0 Application of Medications - topical []  - 0 Application of Medications - injection INTERVENTIONS - Miscellaneous []  - 0 External ear exam []  - 0 Specimen Collection (cultures, biopsies, blood, body fluids, etc.) []  - 0 Specimen(s) / Culture(s) sent or taken to Lab for analysis []  - 0 Patient Transfer (multiple staff / Nurse, adult / Similar devices) []  - 0 Simple Staple / Suture removal (25 or less) []  - 0 Complex Staple / Suture removal (26 or more) []  - 0 Hypo / Hyperglycemic Management (close monitor of Blood Glucose) Haley Wyatt, Haley Wyatt ([id number]) [id number] Page 3 of 7 []  - 0 Ankle / Brachial Index (ABI) - do not check if billed separately X- 1 5 Vital Signs Has the patient been seen at the hospital within the last three years: Yes Total Score: 85 Level Of Care: New/Established - Level 3 Electronic Signature(s) Signed: 06/14/2023 7:54:18 PM By: Shawn Stall RN, BSN Entered By: Shawn Stall on 06/14/2023 05:25:35 -------------------------------------------------------------------------------- Encounter Discharge Information Details Patient Name: Date of Service: Haley Wyatt, Haley Wyatt 06/14/2023 7:45 A M Medical Record Number: [id number] Patient  Account Number: [id number] Date of Birth/Sex: Treating RN: Jan 11, 1976 (47 y.o. Arta Silence Primary Care Whitley Patchen: Dulce Sellar Other Clinician: Referring Lataisha Colan: Treating Ly Wass/Extender: Milbert Coulter in Treatment: 9 Encounter Discharge Information Items Discharge Condition: Stable Ambulatory Status: Ambulatory Discharge Destination: Home Transportation: Private Auto Accompanied By: family member Schedule Follow-up Appointment: Yes Clinical Summary of Care: Electronic Signature(s) Signed: 06/14/2023 7:54:18 PM By: Shawn Stall RN, BSN Entered By: Shawn Stall on 06/14/2023 05:26:03 -------------------------------------------------------------------------------- Lower Extremity Assessment Details Patient Name: Date of Service: Haley Wyatt 06/14/2023 7:45 A M Medical Record Number: [id number] Patient Account Number: [id number] Date of Birth/Sex: Treating RN: 11-Aug-1976 (47 y.o. Arta Silence Primary Care Burnis Halling: Dulce Sellar Other Clinician: Referring Kianna Billet: Treating Erionna Strum/Extender: Milbert Coulter in Treatment: 9 Electronic Signature(s) Signed: 06/14/2023 7:54:18 PM By: Shawn Stall RN, BSN Entered By: Shawn Stall on 06/14/2023 05:04:55 -------------------------------------------------------------------------------- Multi-Disciplinary Care Plan Details Patient Name: Date of Service: Haley Wyatt, Haley Wyatt 06/14/2023 7:45 A M Medical Record Number: [id number] Patient Account Number: [id number] Haley Wyatt, Haley Wyatt ([id number]) [id number] Page 4 of 7 Date of Birth/Sex: Treating RN: August 05, 1976 (47 y.o. Arta Silence Primary Care Venice Marcucci: Other Clinician: Dulce Sellar Referring Kandy Towery: Treating Fizza Scales/Extender: Milbert Coulter in Treatment: 9 Active Inactive Soft Tissue Infection Nursing Diagnoses: Impaired tissue  integrity Goals: Patient will remain free of wound infection Date Initiated: 06/07/2023 Target Resolution Date: 06/29/2023 Goal Status: Active Interventions: Assess signs and symptoms of infection every visit Treatment Activities: Culture and sensitivity : 06/07/2023 Notes: Electronic Signature(s) Signed: 06/14/2023 7:54:18 PM By: Shawn Stall RN, BSN Entered By: Shawn Stall on 06/14/2023 05:06:56 -------------------------------------------------------------------------------- Pain Assessment Details Patient Name: Date of Service: Haley Wyatt, Haley Wyatt 06/14/2023 7:45 A M Medical Record Number: [id number] Patient Account Number: [id number] Date of Birth/Sex: Treating RN: [birth year] (47 y.o. Arta Silence Primary Care  Jacqeline Broers: Dulce Sellar Other Clinician: Referring Novalee Horsfall: Treating Kaegan Hettich/Extender: Milbert Coulter in Treatment: 9 Active Problems Location of Pain Severity and Description of Pain Patient Has Paino No Site Locations Pain Management and Medication Current Pain Management: Haley Wyatt, Haley Wyatt ([id number]) [id number] Page 5 of 7 Electronic Signature(s) Signed: 06/14/2023 7:54:18 PM By: Shawn Stall RN, BSN Entered By: Shawn Stall on 06/14/2023 05:02:24 -------------------------------------------------------------------------------- Patient/Caregiver Education Details Patient Name: Date of Service: Haley Wyatt, Haley Wyatt 8/21/2024andnbsp7:45 A M Medical Record Number: [id number] Patient Account Number: [id number] Date of Birth/Gender: Treating RN: 1976/01/19 (47 y.o. Arta Silence Primary Care Physician: Dulce Sellar Other Clinician: Referring Physician: Treating Physician/Extender: Milbert Coulter in Treatment: 9 Education Assessment Education Provided To: Patient Education Topics Provided Wound/Skin Impairment: Handouts: Caring for Your Ulcer Methods:  Explain/Verbal Responses: Reinforcements needed Electronic Signature(s) Signed: 06/14/2023 7:54:18 PM By: Shawn Stall RN, BSN Entered By: Shawn Stall on 06/14/2023 05:07:07 -------------------------------------------------------------------------------- Wound Assessment Details Patient Name: Date of Service: Haley Wyatt, Haley Wyatt 06/14/2023 7:45 A M Medical Record Number: [id number] Patient Account Number: [id number] Date of Birth/Sex: Treating RN: 09-09-1976 (47 y.o. Arta Silence Primary Care Kalmen Lollar: Dulce Sellar Other Clinician: Referring Khristine Verno: Treating Ayra Hodgdon/Extender: Riki Rusk Weeks in Treatment: 9 Wound Status Wound Number: 1R Primary Etiology: Abscess Wound Location: Umbilicus Wound Status: Open Wounding Event: Surgical Injury Comorbid History: Hypertension Date Acquired: 02/17/2023 Weeks Of Treatment: 9 Clustered Wound: No Photos Haley Wyatt, Haley Wyatt ([id number]) [id number] Page 6 of 7 Wound Measurements Length: (cm) 0.1 Width: (cm) 0.1 Depth: (cm) 3.4 Area: (cm) 0.008 Volume: (cm) 0.027 % Reduction in Area: 97.2% % Reduction in Volume: 97.3% Epithelialization: Large (67-100%) Tunneling: No Undermining: No Wound Description Classification: Full Thickness Without Exposed Support Wound Margin: Distinct, outline attached Exudate Amount: Medium Exudate Type: Purulent Exudate Color: yellow, brown, green Structures Foul Odor After Cleansing: No Slough/Fibrino Yes Wound Bed Granulation Amount: None Present (0%) Exposed Structure Necrotic Amount: None Present (0%) Fascia Exposed: No Fat Layer (Subcutaneous Tissue) Exposed: No Tendon Exposed: No Muscle Exposed: No Joint Exposed: No Bone Exposed: No Periwound Skin Texture Texture Color No Abnormalities Noted: No No Abnormalities Noted: No Callus: No Atrophie Blanche: No Crepitus: No Cyanosis: No Excoriation: Yes Ecchymosis: No Induration:  No Erythema: No Rash: No Hemosiderin Staining: No Scarring: No Mottled: No Pallor: No Moisture Rubor: No No Abnormalities Noted: Yes Temperature / Pain Temperature: No Abnormality Treatment Notes Wound #1R (Umbilicus) Cleanser Soap and Water Discharge Instruction: May shower and wash wound with dial antibacterial soap and water prior to dressing change. Peri-Wound Care Topical Primary Dressing Secondary Dressing Zetuvit Plus Silicone Border Dressing 4x4 (in/in) Discharge Instruction: Apply silicone border or bordered gauze. Secured With Compression Wrap Compression Stockings Add-Ons Haley Wyatt, Haley Wyatt ([id number]) [id number] Page 7 of 7 Electronic Signature(s) Signed: 06/14/2023 7:54:18 PM By: Shawn Stall RN, BSN Entered By: Shawn Stall on 06/14/2023 05:06:27 -------------------------------------------------------------------------------- Vitals Details Patient Name: Date of Service: Haley Wyatt, Haley Wyatt 06/14/2023 7:45 A M Medical Record Number: [id number] Patient Account Number: [id number] Date of Birth/Sex: Treating RN: [birth year] (47 y.o. [name], Millard.Loa Primary Care Marland Reine: Dulce Sellar Other Clinician: Referring Annette Liotta: Treating Leeah Politano/Extender: Milbert Coulter in Treatment: 9 Vital Signs Time Taken: 08:00 Temperature (F): 98.4 Pulse (bpm): 96 Respiratory Rate (breaths/min): 20 Blood Pressure (mmHg): 124/85 Reference Range: 80 - 120 mg / dl Electronic Signature(s) Signed: 06/14/2023 7:54:18 PM By: Shawn Stall RN, BSN Entered By: Shawn Stall on 06/14/2023 05:02:18

## 2023-06-23 ENCOUNTER — Other Ambulatory Visit: Payer: Self-pay | Admitting: Family

## 2023-06-28 ENCOUNTER — Encounter (HOSPITAL_BASED_OUTPATIENT_CLINIC_OR_DEPARTMENT_OTHER): Payer: 59 | Attending: Physician Assistant | Admitting: Physician Assistant

## 2023-06-28 DIAGNOSIS — I10 Essential (primary) hypertension: Secondary | ICD-10-CM | POA: Diagnosis not present

## 2023-06-28 DIAGNOSIS — L98492 Non-pressure chronic ulcer of skin of other sites with fat layer exposed: Secondary | ICD-10-CM | POA: Insufficient documentation

## 2023-06-28 DIAGNOSIS — T8131XA Disruption of external operation (surgical) wound, not elsewhere classified, initial encounter: Secondary | ICD-10-CM | POA: Diagnosis not present

## 2023-06-28 NOTE — Progress Notes (Signed)
Wyatt, Haley ([id number]) [id number] Page 1 of 6 Visit Report for 06/28/2023 Chief Complaint Document Details Patient Name: Date of Service: Haley Wyatt 06/28/2023 10:30 A M Medical Record Number: [id number] Patient Account Number: [id number] Date of Birth/Sex: Treating RN: 1976/09/03 (47 y.o. F) Primary Care Provider: Dulce Sellar Other Clinician: Referring Provider: Treating Provider/Extender: Milbert Coulter in Treatment: 11 Information Obtained from: Patient Chief Complaint Umbilical surgical ulcer s/p abdominoplasty Electronic Signature(s) Signed: 06/28/2023 11:05:52 AM By: Allen Derry PA-C Entered By: Allen Derry on 06/28/2023 08:05:51 -------------------------------------------------------------------------------- HPI Details Patient Name: Date of Service: Haley Wyatt, [name] 06/28/2023 10:30 A M Medical Record Number: [id number] Patient Account Number: [id number] Date of Birth/Sex: Treating RN: 03-Nov-1975 (46 y.o. F) Primary Care Provider: Dulce Sellar Other Clinician: Referring Provider: Treating Provider/Extender: Milbert Coulter in Treatment: 11 History of Present Illness HPI Description: 04-12-2023 upon evaluation today patient appears to be doing poorly today with regard to her wound. She subsequently has an umbilical ulcer which is actually secondary to surgery. She had a extensive [place] and subsequently they did an extremely good job with that which is awesome. With that being said she unfortunately has several sutures that are actually embedded in the umbilicus region which she was not able to get anyone to take out at her previous clinic where she has been being seen. With that being said other than the swollen area everything else has been healing nicely. Nonetheless I do believe this is actually somewhat deep-rooted beyond where just  the sutures need to be removed to be honest. 04-26-2023 upon evaluation today patient appears to be doing well currently in regard to her umbilical ulcer. This is a surgical wound and does seem to be doing much better. Fortunately I do not see any evidence of active infection locally nor systemically which is great news. No fevers, chills, nausea, vomiting, or diarrhea. 05-03-2023 upon evaluation patient appears to be doing excellent in regard to her wound which is actually measuring significantly smaller compared to even last week's evaluation. In fact I think the majority of the sidewalls are actually healed there is just a very tiny area in the base of the wound that might still be open. Nonetheless she wants to use the plug for the umbilical area in order to ensure that the umbilical region does not completely closed up and heals internally. For that reason I believe that she could do this at this point but I would want to use a little bit of mupirocin on the plug that she is make sure to clean it well between times that she puts this in. She voiced understanding. 05-10-2023 upon evaluation today patient appears to be doing well currently in regard to her wound which actually appears to be completely healed. I am extremely happy for her knows she is extremely happy as well. 06-07-2023 upon evaluation today patient is here for reevaluation and its actually been 1 month although since I last saw her. At that point she had healed in regard to the area of the umbilical region. Unfortunately in the central area where the abdominis rectus muscles had separated and there was a hematoma previous back in May on the 15th 2024 this was after she lifted a child and felt something pop in this area following her surgery. Nonetheless I think this area of hematoma now is an opening that unfortunately became infected when she first noted this a little over  a week ago. She went to urgent care she was given doxycycline  and seems to be doing some better although is not completely resolved. Unfortunately there is quite a bit of undermining I was barely able to get a skinny probe in but once I did there was significant undermining underneath which makes me think that this is likely an abscess or seroma neither which were to be able to manage here in wound care due to the fact that I cannot get into the wound area. 06-14-2023 upon evaluation today patient appears to be doing well currently in regard to her wound from the standpoint of this not being worse but is also not better. She still has the pinpoint opening which we cannot even really packed anything into that goes down about 3.4 cm. Fortunately there does not appear to be any signs of infection at this time which is good news systemically though locally we have been treating her for an infection here. She does seem to be tolerating the dressing changes without complication. BISAILLON, Kaisa ([id number]) [id number] Page 2 of 6 06-28-2023 upon evaluation today patient's wound in the umbilical area unfortunately is not even accessible with a skinny probe. She does have drainage here but I am unsure exactly what is going on underneath. She does have the appointment with wound care/plastic surgery coming up on the 16th that is next week. In the meantime as it stands right now I believe that the patient is going to require some type of intervention there is absolutely nothing I can do at this point to get this to heal from a wound care perspective I think this is gena require surgery to be perfectly honest. She voiced understanding and she again does have that appointment. In the meantime the drainage that she has, try to obtain a sample of after cleaning the area to get a fresh sample of the drainage to test for bacteria since I cannot get into the wound on trying do the best I can to try and help manage this until such a time and something  definitively can be done. Electronic Signature(s) Signed: 06/28/2023 6:07:14 PM By: Allen Derry PA-C Entered By: Allen Derry on 06/28/2023 15:07:14 -------------------------------------------------------------------------------- Physical Exam Details Patient Name: Date of Service: Haley Wyatt 06/28/2023 10:30 A M Medical Record Number: [id number] Patient Account Number: [id number] Date of Birth/Sex: Treating RN: 1975/11/26 (47 y.o. F) Primary Care Provider: Dulce Sellar Other Clinician: Referring Provider: Treating Provider/Extender: Milbert Coulter in Treatment: 11 Constitutional Well-nourished and well-hydrated in no acute distress. Respiratory normal breathing without difficulty. Psychiatric this patient is able to make decisions and demonstrates good insight into disease process. Alert and Oriented x 3. pleasant and cooperative. Notes Upon inspection patient's wound again is completely sealed up minus just a pinpoint opening that I cannot even get a skinny probe into. There is really nothing that I would be able to do to manage this. I discussed that with the patient today. She fortunately does have the appointment plastic surgery/wound care next week. Electronic Signature(s) Signed: 06/28/2023 6:07:32 PM By: Allen Derry PA-C Entered By: Allen Derry on 06/28/2023 15:07:32 -------------------------------------------------------------------------------- Physician Orders Details Patient Name: Date of Service: Haley Wyatt, [name] 06/28/2023 10:30 A M Medical Record Number: [id number] Patient Account Number: [id number] Date of Birth/Sex: Treating RN: [birth year] (47 y.o. Arta Silence Primary Care Provider: Dulce Sellar Other Clinician: Referring Provider: Treating Provider/Extender: Milbert Coulter in Treatment: [phone] Verbal / Phone  Orders: No Diagnosis Coding ICD-10 Coding Code Description T81.31XA Disruption of  external operation (surgical) wound, not elsewhere classified, initial encounter L98.492 Non-pressure chronic ulcer of skin of other sites with fat layer exposed Follow-up Appointments ppointment in 1 week. Leonard Schwartz 4pm Wednesday 07/05/2023. room 9 Return A [place], [place] ([id number]) [id number] Page 3 of 6 Other: - [facility] on [facility]- patient to follow with them. Culture PCR taken. Will call you once it results. Wound Treatment Wound #1R - Umbilicus Cleanser: Soap and Water 1 x Per Day/30 Days Discharge Instructions: May shower and wash wound with dial antibacterial soap and water prior to dressing change. Secondary Dressing: Zetuvit Plus Silicone Border Dressing 4x4 (in/in) (DME) (Generic) 1 x Per Day/30 Days Discharge Instructions: Apply silicone border or bordered gauze. Laboratory naerobe culture (MICRO) - vikor culture taken. - (ICD10 T81.31XA - Disruption of external Bacteria identified in Unspecified specimen by A operation (surgical) wound, not elsewhere classified, initial encounter) LOINC Code: 635-3 Convenience Name: Anaerobic culture Electronic Signature(s) Signed: 06/28/2023 1:48:54 PM By: Allen Derry PA-C Signed: 06/28/2023 6:09:26 PM By: Shawn Stall RN, BSN Entered By: Shawn Stall on 06/28/2023 08:46:00 Prescription 06/28/2023 -------------------------------------------------------------------------------- Sima Matas PA Patient Name: Provider: [birth year] [id number] Date of Birth: NPI#Sherral Hammers Sex: DEA #: [phone] Phone #: License #: UPN: Patient Address: [address] Eligha Bridegroom [facility] [place], [place] [address] [address] [place], [place] [address] [phone] Allergies No Known Drug Allergies Provider's Orders naerobe culture - ICD10: T81.31XA - vikor culture taken. Bacteria identified in Unspecified specimen by A LOINC  Code: 635-3 Convenience Name: Anaerobic culture Hand Signature: Date(s): Electronic Signature(s) Signed: 06/28/2023 1:48:54 PM By: Allen Derry PA-C Signed: 06/28/2023 6:09:26 PM By: Shawn Stall RN, BSN Entered By: Shawn Stall on 06/28/2023 08:46:00 Pridmore, Vanissa ([id number]) [id number] Page 4 of 6 -------------------------------------------------------------------------------- Problem List Details Patient Name: Date of Service: Haley Wyatt 06/28/2023 10:30 A M Medical Record Number: [id number] Patient Account Number: [id number] Date of Birth/Sex: Treating RN: 1976/04/21 (47 y.o. F) Primary Care Provider: Dulce Sellar Other Clinician: Referring Provider: Treating Provider/Extender: Milbert Coulter in Treatment: 11 Active Problems ICD-10 Encounter Code Description Active Date MDM Diagnosis T81.31XA Disruption of external operation (surgical) wound, not elsewhere classified, 04/12/2023 No Yes initial encounter L98.492 Non-pressure chronic ulcer of skin of other sites with fat layer exposed 04/12/2023 No Yes Inactive Problems Resolved Problems Electronic Signature(s) Signed: 06/28/2023 11:05:43 AM By: Allen Derry PA-C Entered By: Allen Derry on 06/28/2023 08:05:43 -------------------------------------------------------------------------------- Progress Note Details Patient Name: Date of Service: Haley Wyatt, [name] 06/28/2023 10:30 A M Medical Record Number: [id number] Patient Account Number: [id number] Date of Birth/Sex: Treating RN: 1975/11/25 (47 y.o. F) Primary Care Provider: Dulce Sellar Other Clinician: Referring Provider: Treating Provider/Extender: Milbert Coulter in Treatment: 11 Subjective Chief Complaint Information obtained from Patient Umbilical surgical ulcer s/p abdominoplasty History of Present Illness (HPI) 04-12-2023 upon evaluation today patient appears to be doing  poorly today with regard to her wound. She subsequently has an umbilical ulcer which is actually secondary to surgery. She had a extensive tummy tuck which was actually performed abroad and subsequently they did an extremely good job with that which is awesome. With that being said she unfortunately has several sutures that are actually embedded in the umbilicus region which she was not able to get anyone to take out at her previous clinic where she has been being  seen. With that being said other than the swollen area everything else has been healing nicely. Nonetheless I do believe this is actually somewhat deep-rooted beyond where just the sutures need to be removed to be honest. 04-26-2023 upon evaluation today patient appears to be doing well currently in regard to her umbilical ulcer. This is a surgical wound and does seem to be doing much better. Fortunately I do not see any evidence of active infection locally nor systemically which is great news. No fevers, chills, nausea, vomiting, or diarrhea. 05-03-2023 upon evaluation patient appears to be doing excellent in regard to her wound which is actually measuring significantly smaller compared to even last week's evaluation. In fact I think the majority of the sidewalls are actually healed there is just a very tiny area in the base of the wound that might still be open. Nonetheless she wants to use the plug for the umbilical area in order to ensure that the umbilical region does not completely closed up and heals internally. For that reason I believe that she could do this at this point but I would want to use a little bit of mupirocin on the plug that she is make sure to clean it well between times that she puts this in. She voiced understanding. 05-10-2023 upon evaluation today patient appears to be doing well currently in regard to her wound which actually appears to be completely healed. I am extremely happy for her knows she is extremely happy as  well. 06-07-2023 upon evaluation today patient is here for reevaluation and its actually been 1 month although since I last saw her. At that point she had healed in regard to the area of the umbilical region. Unfortunately in the central area where the abdominis rectus muscles had separated and there was a hematoma Hsiung, Cameo ([id number]) [phone].pdf Page 5 of 6 previous back in May on the 15th 2024 this was after she lifted a child and felt something pop in this area following her surgery. Nonetheless I think this area of hematoma now is an opening that unfortunately became infected when she first noted this a little over a week ago. She went to urgent care she was given doxycycline and seems to be doing some better although is not completely resolved. Unfortunately there is quite a bit of undermining I was barely able to get a skinny probe in but once I did there was significant undermining underneath which makes me think that this is likely an abscess or seroma neither which were to be able to manage here in wound care due to the fact that I cannot get into the wound area. 06-14-2023 upon evaluation today patient appears to be doing well currently in regard to her wound from the standpoint of this not being worse but is also not better. She still has the pinpoint opening which we cannot even really packed anything into that goes down about 3.4 cm. Fortunately there does not appear to be any signs of infection at this time which is good news systemically though locally we have been treating her for an infection here. She does seem to be tolerating the dressing changes without complication. 06-28-2023 upon evaluation today patient's wound in the umbilical area unfortunately is not even accessible with a skinny probe. She does have drainage here but I am unsure exactly what is going on underneath. She does have the appointment with wound care/plastic surgery coming up on  the 16th that is next week. In the meantime as  it stands right now I believe that the patient is going to require some type of intervention there is absolutely nothing I can do at this point to get this to heal from a wound care perspective I think this is gena require surgery to be perfectly honest. She voiced understanding and she again does have that appointment. In the meantime the drainage that she has, try to obtain a sample of after cleaning the area to get a fresh sample of the drainage to test for bacteria since I cannot get into the wound on trying do the best I can to try and help manage this until such a time and something definitively can be done. Objective Constitutional Well-nourished and well-hydrated in no acute distress. Vitals Time Taken: 11:04 AM, Temperature: 98.4 F, Pulse: 68 bpm, Respiratory Rate: 18 breaths/min, Blood Pressure: 144/90 mmHg. Respiratory normal breathing without difficulty. Psychiatric this patient is able to make decisions and demonstrates good insight into disease process. Alert and Oriented x 3. pleasant and cooperative. General Notes: Upon inspection patient's wound again is completely sealed up minus just a pinpoint opening that I cannot even get a skinny probe into. There is really nothing that I would be able to do to manage this. I discussed that with the patient today. She fortunately does have the appointment plastic surgery/wound care next week. Integumentary (Hair, Skin) Wound #1R status is Open. Original cause of wound was Surgical Injury. The date acquired was: 02/17/2023. The wound has been in treatment 11 weeks. The wound is located on the Umbilicus. The wound measures 0.1cm length x 0.1cm width x 3.4cm depth; 0.008cm^2 area and 0.027cm^3 volume. There is no tunneling or undermining noted. There is a medium amount of purulent drainage noted. Foul odor after cleansing was noted. The wound margin is distinct with the outline attached to the  wound base. There is no granulation within the wound bed. There is no necrotic tissue within the wound bed. The periwound skin appearance had no abnormalities noted for moisture. The periwound skin appearance exhibited: Excoriation. The periwound skin appearance did not exhibit: Callus, Crepitus, Induration, Rash, Scarring, Atrophie Blanche, Cyanosis, Ecchymosis, Hemosiderin Staining, Mottled, Pallor, Rubor, Erythema. Periwound temperature was noted as No Abnormality. Assessment Active Problems ICD-10 Disruption of external operation (surgical) wound, not elsewhere classified, initial encounter Non-pressure chronic ulcer of skin of other sites with fat layer exposed Plan Follow-up Appointments: Return Appointment in 1 week. Leonard Schwartz 4pm Wednesday 07/05/2023. room 9 Other: - [facility] on 07/11/2023- patient to follow with them. Culture PCR taken. Will call you once it results. Laboratory ordered were: Anaerobic culture - vikor culture taken. WOUND #1R: - Umbilicus Wound Laterality: Cleanser: Soap and Water 1 x Per Day/30 Days Discharge Instructions: May shower and wash wound with dial antibacterial soap and water prior to dressing change. Secondary Dressing: Zetuvit Plus Silicone Border Dressing 4x4 (in/in) (DME) (Generic) 1 x Per Day/30 Days Discharge Instructions: Apply silicone border or bordered gauze. 1. I am going to recommend that we have the patient continue to monitor for any signs of infection or worsening. She does have the appointment with [facility] wound care/plastic surgery on the 16th which is next week and I do believe that that is going to be crucial for her to get to that appointment. Honestly I do not Meara, Massiel ([id number]) [id number] Page 6 of 6 know that there is nothing much we can do from the standpoint of wound care in my clinic as there is really just not  much that I can get to in fact I cannot even pack anything into this we can  even consider doing silver nitrate because of the fact that she unfortunately is having a lot of issues here with this closing down to the point that I cannot get anything in the opening despite the fact it continues to drain. 2. I am going to recommend that we monitor her next week and then the following week she will be going to wound care/plastic surgery at [facility]. We will see patient back for reevaluation in 1 week here in the clinic. If anything worsens or changes patient will contact our office for additional recommendations. Electronic Signature(s) Signed: 06/28/2023 6:08:31 PM By: Allen Derry PA-C Entered By: Allen Derry on 06/28/2023 15:08:30 -------------------------------------------------------------------------------- SuperBill Details Patient Name: Date of Service: Haley Wyatt, [name] 06/28/2023 Medical Record Number: [id number] Patient Account Number: [id number] Date of Birth/Sex: Treating RN: 16-Nov-1975 (47 y.o. [name], Yvonne Kendall Primary Care Provider: Dulce Sellar Other Clinician: Referring Provider: Treating Provider/Extender: Milbert Coulter in Treatment: 11 Diagnosis Coding ICD-10 Codes Code Description T81.31XA Disruption of external operation (surgical) wound, not elsewhere classified, initial encounter L98.492 Non-pressure chronic ulcer of skin of other sites with fat layer exposed Facility Procedures : CPT4 Code: [id number] Description: 99213 - WOUND CARE VISIT-LEV 3 EST PT Modifier: Quantity: 1 Physician Procedures : CPT4 Code Description Modifier [id number] 99213 - WC PHYS LEVEL 3 - EST PT ICD-10 Diagnosis Description T81.31XA Disruption of external operation (surgical) wound, not elsewhere classified, initial encounter L98.492 Non-pressure chronic ulcer of skin of  other sites with fat layer exposed Quantity: 1 Electronic Signature(s) Signed: 06/28/2023 6:08:43 PM By: Allen Derry PA-C Previous Signature: 06/28/2023 1:48:54 PM Version By:  Allen Derry PA-C Entered By: Allen Derry on 06/28/2023 15:08:43

## 2023-06-29 DIAGNOSIS — L98492 Non-pressure chronic ulcer of skin of other sites with fat layer exposed: Secondary | ICD-10-CM | POA: Diagnosis not present

## 2023-06-29 DIAGNOSIS — T8131XA Disruption of external operation (surgical) wound, not elsewhere classified, initial encounter: Secondary | ICD-10-CM | POA: Diagnosis not present

## 2023-07-05 ENCOUNTER — Encounter (HOSPITAL_BASED_OUTPATIENT_CLINIC_OR_DEPARTMENT_OTHER): Payer: 59 | Admitting: Physician Assistant

## 2023-07-05 DIAGNOSIS — L98492 Non-pressure chronic ulcer of skin of other sites with fat layer exposed: Secondary | ICD-10-CM | POA: Diagnosis not present

## 2023-07-05 DIAGNOSIS — T8131XA Disruption of external operation (surgical) wound, not elsewhere classified, initial encounter: Secondary | ICD-10-CM | POA: Diagnosis not present

## 2023-07-05 DIAGNOSIS — I10 Essential (primary) hypertension: Secondary | ICD-10-CM | POA: Diagnosis not present

## 2023-07-05 NOTE — Progress Notes (Signed)
Wainwright, Tzipora ([id number]) [id number] Page 1 of 7 Visit Report for 06/28/2023 Arrival Information Details Patient Name: Date of Service: Haley Wyatt 06/28/2023 10:30 A M Medical Record Number: [id number] Patient Account Number: [id number] Date of Birth/Sex: Treating RN: [birth year] (47 y.o. F) Primary Care Aydrien Froman: Dulce Sellar Other Clinician: Referring Iceis Knab: Treating Madelyne Millikan/Extender: Milbert Coulter in Treatment: 11 Visit Information History Since Last Visit Added or deleted any medications: No Patient Arrived: Ambulatory Any new allergies or adverse reactions: No Arrival Time: 11:03 Had a fall or experienced change in No Accompanied By: self activities of daily living that may affect Transfer Assistance: None risk of falls: Patient Identification Verified: Yes Signs or symptoms of abuse/neglect since last visito No Secondary Verification Process Completed: Yes Hospitalized since last visit: No Patient Requires Transmission-Based Precautions: No Implantable device outside of the clinic excluding No Patient Has Alerts: No cellular tissue based products placed in the center since last visit: Has Dressing in Place as Prescribed: Yes Pain Present Now: No Electronic Signature(s) Signed: 07/05/2023 4:44:01 PM By: Thayer Dallas Entered By: Thayer Dallas on 06/28/2023 11:04:41 -------------------------------------------------------------------------------- Clinic Level of Care Assessment Details Patient Name: Date of Service: Haley Wyatt 06/28/2023 10:30 A M Medical Record Number: [id number] Patient Account Number: [id number] Date of Birth/Sex: Treating RN: July 08, 1976 (47 y.o. [name], Millard.Loa Primary Care Asuna Peth: Dulce Sellar Other Clinician: Referring Kedron Uno: Treating Rande Dario/Extender: Milbert Coulter in Treatment: 11 Clinic Level of Care Assessment Items TOOL 4  Quantity Score X- 1 0 Use when only an EandM is performed on FOLLOW-UP visit ASSESSMENTS - Nursing Assessment / Reassessment X- 1 10 Reassessment of Co-morbidities (includes updates in patient status) X- 1 5 Reassessment of Adherence to Treatment Plan ASSESSMENTS - Wound and Skin A ssessment / Reassessment X - Simple Wound Assessment / Reassessment - one wound 1 5 []  - 0 Complex Wound Assessment / Reassessment - multiple wounds X- 1 10 Dermatologic / Skin Assessment (not related to wound area) ASSESSMENTS - Focused Assessment []  - 0 Circumferential Edema Measurements - multi extremities []  - 0 Nutritional Assessment / Counseling / Intervention [name], [name] ([id number]) [id number] Page 2 of 7 []  - 0 Lower Extremity Assessment (monofilament, tuning fork, pulses) []  - 0 Peripheral Arterial Disease Assessment (using hand held doppler) ASSESSMENTS - Ostomy and/or Continence Assessment and Care []  - 0 Incontinence Assessment and Management []  - 0 Ostomy Care Assessment and Management (repouching, etc.) PROCESS - Coordination of Care X - Simple Patient / Family Education for ongoing care 1 15 []  - 0 Complex (extensive) Patient / Family Education for ongoing care X- 1 10 Staff obtains Chiropractor, Records, T Results / Process Orders est []  - 0 Staff telephones HHA, Nursing Homes / Clarify orders / etc []  - 0 Routine Transfer to another Facility (non-emergent condition) []  - 0 Routine Hospital Admission (non-emergent condition) []  - 0 New Admissions / Manufacturing engineer / Ordering NPWT Apligraf, etc. , []  - 0 Emergency Hospital Admission (emergent condition) X- 1 10 Simple Discharge Coordination []  - 0 Complex (extensive) Discharge Coordination PROCESS - Special Needs []  - 0 Pediatric / Minor Patient Management []  - 0 Isolation Patient Management []  - 0 Hearing / Language / Visual special needs []  - 0 Assessment of Community  assistance (transportation, D/C planning, etc.) []  - 0 Additional assistance / Altered mentation []  - 0 Support Surface(s) Assessment (bed, cushion, seat, etc.) INTERVENTIONS - Wound Cleansing / Measurement X - Simple Wound Cleansing - one  wound 1 5 []  - 0 Complex Wound Cleansing - multiple wounds X- 1 5 Wound Imaging (photographs - any number of wounds) []  - 0 Wound Tracing (instead of photographs) X- 1 5 Simple Wound Measurement - one wound []  - 0 Complex Wound Measurement - multiple wounds INTERVENTIONS - Wound Dressings X - Small Wound Dressing one or multiple wounds 1 10 []  - 0 Medium Wound Dressing one or multiple wounds []  - 0 Large Wound Dressing one or multiple wounds []  - 0 Application of Medications - topical []  - 0 Application of Medications - injection INTERVENTIONS - Miscellaneous []  - 0 External ear exam X- 1 5 Specimen Collection (cultures, biopsies, blood, body fluids, etc.) []  - 0 Specimen(s) / Culture(s) sent or taken to Lab for analysis []  - 0 Patient Transfer (multiple staff / Nurse, adult / Similar devices) []  - 0 Simple Staple / Suture removal (25 or less) []  - 0 Complex Staple / Suture removal (26 or more) []  - 0 Hypo / Hyperglycemic Management (close monitor of Blood Glucose) Septer, Oluwatomisin ([id number]) [id number] Page 3 of 7 []  - 0 Ankle / Brachial Index (ABI) - do not check if billed separately X- 1 5 Vital Signs Has the patient been seen at the hospital within the last three years: Yes Total Score: 100 Level Of Care: New/Established - Level 3 Electronic Signature(s) Signed: 06/28/2023 6:09:26 PM By: Shawn Stall RN, BSN Entered By: Shawn Stall on 06/28/2023 11:45:32 -------------------------------------------------------------------------------- Encounter Discharge Information Details Patient Name: Date of Service: Haley Wyatt, [name] 06/28/2023 10:30 A M Medical Record Number: [id number] Patient Account  Number: [id number] Date of Birth/Sex: Treating RN: 06-19-1976 (47 y.o. Haley Wyatt Primary Care Adalea Handler: Dulce Sellar Other Clinician: Referring Jaydeen Odor: Treating Siah Steely/Extender: Milbert Coulter in Treatment: 11 Encounter Discharge Information Items Discharge Condition: Stable Ambulatory Status: Ambulatory Discharge Destination: Home Transportation: Private Auto Accompanied By: self Schedule Follow-up Appointment: Yes Clinical Summary of Care: Electronic Signature(s) Signed: 06/28/2023 6:09:26 PM By: Shawn Stall RN, BSN Entered By: Shawn Stall on 06/28/2023 11:46:42 -------------------------------------------------------------------------------- Lower Extremity Assessment Details Patient Name: Date of Service: Haley [place], [name] 06/28/2023 10:30 A M Medical Record Number: [id number] Patient Account Number: [id number] Date of Birth/Sex: Treating RN: May 12, 1976 (47 y.o. F) Primary Care Jezreel Sisk: Dulce Sellar Other Clinician: Referring Devante Capano: Treating Jerame Hedding/Extender: Milbert Coulter in Treatment: 11 Electronic Signature(s) Signed: 07/05/2023 4:44:01 PM By: Thayer Dallas Entered By: Thayer Dallas on 06/28/2023 11:07:55 -------------------------------------------------------------------------------- Multi-Disciplinary Care Plan Details Patient Name: Date of Service: Haley [place]att, [name] 06/28/2023 10:30 A M Medical Record Number: [id number] Patient Account Number: [id number] FLITTON, Annaleise ([id number]) [id number] Page 4 of 7 Date of Birth/Sex: Treating RN: 1975/10/29 (47 y.o. Haley Wyatt Primary Care Odessa Nishi: Other Clinician: Dulce Sellar Referring Marialena Wollen: Treating Natividad Halls/Extender: Milbert Coulter in Treatment: 11 Active Inactive Soft Tissue Infection Nursing Diagnoses: Impaired tissue integrity Goals: Patient will remain free of  wound infection Date Initiated: 06/07/2023 Target Resolution Date: 06/29/2023 Goal Status: Active Interventions: Assess signs and symptoms of infection every visit Treatment Activities: Culture and sensitivity : 06/07/2023 Notes: Electronic Signature(s) Signed: 06/28/2023 6:09:26 PM By: Shawn Stall RN, BSN Entered By: Shawn Stall on 06/28/2023 11:35:57 -------------------------------------------------------------------------------- Pain Assessment Details Patient Name: Date of Service: Haley [place]att, [name] 06/28/2023 10:30 A M Medical Record Number: [id number] Patient Account Number: [id number] Date of Birth/Sex: Treating RN: 10-May-1976 (47 y.o. F) Primary Care Chanan Detwiler: Dulce Sellar Other Clinician: Referring [place] Antolin: Treating Marian Meneely/Extender: Linwood Dibbles,  Aneta Mins Weeks in Treatment: 11 Active Problems Location of Pain Severity and Description of Pain Patient Has Paino No Site Locations Pain Management and Medication Current Pain ManagementTelethia Strozier, Adalynn ([id number]) [id number] Page 5 of 7 Electronic Signature(s) Signed: 07/05/2023 4:44:01 PM By: Thayer Dallas Entered By: Thayer Dallas on 06/28/2023 11:07:48 -------------------------------------------------------------------------------- Patient/Caregiver Education Details Patient Name: Date of Service: Haley [place]att, [name] 9/4/2024andnbsp10:30 A M Medical Record Number: [id number] Patient Account Number: [id number] Date of Birth/Gender: Treating RN: 06-12-1976 (47 y.o. Haley Wyatt Primary Care Physician: Dulce Sellar Other Clinician: Referring Physician: Treating Physician/Extender: Milbert Coulter in Treatment: 11 Education Assessment Education Provided To: Patient Education Topics Provided Wound/Skin Impairment: Handouts: Caring for Your Ulcer Methods: Explain/Verbal Responses: Reinforcements needed Electronic Signature(s) Signed:  06/28/2023 6:09:26 PM By: Shawn Stall RN, BSN Entered By: Shawn Stall on 06/28/2023 11:36:11 -------------------------------------------------------------------------------- Wound Assessment Details Patient Name: Date of Service: Haley [place]att, [name] 06/28/2023 10:30 A M Medical Record Number: [id number] Patient Account Number: [id number] Date of Birth/Sex: Treating RN: 11-08-1975 (47 y.o. F) Primary Care Seven Dollens: Dulce Sellar Other Clinician: Referring Jaquel Glassburn: Treating Moroni Nester/Extender: Milbert Coulter in Treatment: 11 Wound Status Wound Number: [id number] Primary Etiology: Abscess Wound Location: Umbilicus Wound Status: Open Wounding Event: Surgical Injury Comorbid History: Hypertension Date Acquired: 02/17/2023 Weeks Of Treatment: 11 Clustered Wound: No Photos Berkowitz, Marzella ([id number]) [id number] Page 6 of 7 Wound Measurements Length: (cm) 0.1 Width: (cm) 0.1 Depth: (cm) 3.4 Area: (cm) 0.008 Volume: (cm) 0.027 % Reduction in Area: 97.2% % Reduction in Volume: 97.3% Epithelialization: Large (67-100%) Tunneling: No Undermining: No Wound Description Classification: Full Thickness Without Exposed Support Wound Margin: Distinct, outline attached Exudate Amount: Medium Exudate Type: Purulent Exudate Color: yellow, brown, green Structures Foul Odor After Cleansing: Yes Due to Product Use: No Slough/Fibrino Yes Wound Bed Granulation Amount: None Present (0%) Exposed Structure Necrotic Amount: None Present (0%) Fascia Exposed: No Fat Layer (Subcutaneous Tissue) Exposed: No Tendon Exposed: No Muscle Exposed: No Joint Exposed: No Bone Exposed: No Periwound Skin Texture Texture Color No Abnormalities Noted: No No Abnormalities Noted: No Callus: No Atrophie Blanche: No Crepitus: No Cyanosis: No Excoriation: Yes Ecchymosis: No Induration: No Erythema: No Rash: No Hemosiderin Staining: No Scarring:  No Mottled: No Pallor: No Moisture Rubor: No No Abnormalities Noted: Yes Temperature / Pain Temperature: No Abnormality Electronic Signature(s) Signed: 07/05/2023 4:44:01 PM By: Thayer Dallas Entered By: Thayer Dallas on 06/28/2023 11:18:00 -------------------------------------------------------------------------------- Vitals Details Patient Name: Date of Service: Haley Wyatt, [name] 06/28/2023 10:30 A M Medical Record Number: [id number] Patient Account Number: [id number] Date of Birth/Sex: Treating RN: 07/14/1976 (47 y.o. F) Primary Care Hershel Corkery: Dulce Sellar Other Clinician: Referring Dorie Ohms: Treating Chevon Fomby/Extender: Milbert Coulter in Treatment: 11 Vital Signs Giaimo, Mirielle ([id number]) [id number] Page 7 of 7 Time Taken: 11:04 Temperature (F): 98.4 Pulse (bpm): 68 Respiratory Rate (breaths/min): 18 Blood Pressure (mmHg): 144/90 Reference Range: 80 - 120 mg / dl Electronic Signature(s) Signed: 07/05/2023 4:44:01 PM By: Thayer Dallas Entered By: Thayer Dallas on 06/28/2023 11:07:39

## 2023-07-05 NOTE — Progress Notes (Signed)
KEATLEY, Jocelyne ([id number]) [id number] Page 1 of 6 Visit Report for 07/05/2023 Chief Complaint Document Details Patient Name: Date of Service: Haley Wyatt 07/05/2023 2:15 PM Medical Record Number: [id number] Patient Account Number: [id number] Date of Birth/Sex: Treating RN: 20-Aug-1976 (47 y.o. F) Primary Care Provider: Dulce Sellar Other Clinician: Referring Provider: Treating Provider/Extender: Milbert Coulter in Treatment: 12 Information Obtained from: Patient Chief Complaint Umbilical surgical ulcer s/p abdominoplasty Electronic Signature(s) Signed: 07/05/2023 2:34:56 PM By: Allen Derry PA-C Entered By: Allen Derry on 07/05/2023 11:34:56 -------------------------------------------------------------------------------- HPI Details Patient Name: Date of Service: Haley Wyatt, [name] 07/05/2023 2:15 PM Medical Record Number: [id number] Patient Account Number: [id number] Date of Birth/Sex: Treating RN: Aug 06, 1976 (47 y.o. F) Primary Care Provider: Dulce Sellar Other Clinician: Referring Provider: Treating Provider/Extender: Milbert Coulter in Treatment: 12 History of Present Illness HPI Description: 04-12-2023 upon evaluation today patient appears to be doing poorly today with regard to her wound. She subsequently has an umbilical ulcer which is actually secondary to surgery. She had a extensive tum[place] and subsequently they did an extremely good job with that which is awesome. With that being said she unfortunately has several sutures that are actually embedded in the umbilicus region which she was not able to get anyone to take out at her previous clinic where she has been being seen. With that being said other than the swollen area everything else has been healing nicely. Nonetheless I do believe this is actually somewhat deep-rooted beyond where just the  sutures need to be removed to be honest. 04-26-2023 upon evaluation today patient appears to be doing well currently in regard to her umbilical ulcer. This is a surgical wound and does seem to be doing much better. Fortunately I do not see any evidence of active infection locally nor systemically which is great news. No fevers, chills, nausea, vomiting, or diarrhea. 05-03-2023 upon evaluation patient appears to be doing excellent in regard to her wound which is actually measuring significantly smaller compared to even last week's evaluation. In fact I think the majority of the sidewalls are actually healed there is just a very tiny area in the base of the wound that might still be open. Nonetheless she wants to use the plug for the umbilical area in order to ensure that the umbilical region does not completely closed up and heals internally. For that reason I believe that she could do this at this point but I would want to use a little bit of mupirocin on the plug that she is make sure to clean it well between times that she puts this in. She voiced understanding. 05-10-2023 upon evaluation today patient appears to be doing well currently in regard to her wound which actually appears to be completely healed. I am extremely happy for her knows she is extremely happy as well. 06-07-2023 upon evaluation today patient is here for reevaluation and its actually been 1 month although since I last saw her. At that point she had healed in regard to the area of the umbilical region. Unfortunately in the central area where the abdominis rectus muscles had separated and there was a hematoma previous back in May on the 15th 2024 this was after she lifted a child and felt something pop in this area following her surgery. Nonetheless I think this area of hematoma now is an opening that unfortunately became infected when she first noted this a little over a week  ago. She went to urgent care she was given doxycycline and  seems to be doing some better although is not completely resolved. Unfortunately there is quite a bit of undermining I was barely able to get a skinny probe in but once I did there was significant undermining underneath which makes me think that this is likely an abscess or seroma neither which were to be able to manage here in wound care due to the fact that I cannot get into the wound area. 06-14-2023 upon evaluation today patient appears to be doing well currently in regard to her wound from the standpoint of this not being worse but is also not better. She still has the pinpoint opening which we cannot even really packed anything into that goes down about 3.4 cm. Fortunately there does not appear to be any signs of infection at this time which is good news systemically though locally we have been treating her for an infection here. She does seem to be tolerating the dressing changes without complication. VOTA, Lyndell ([id number]) [id number] Page 2 of 6 06-28-2023 upon evaluation today patient's wound in the umbilical area unfortunately is not even accessible with a skinny probe. She does have drainage here but I am unsure exactly what is going on underneath. She does have the appointment with wound care/plastic surgery coming up on the 16th that is next week. In the meantime as it stands right now I believe that the patient is going to require some type of intervention there is absolutely nothing I can do at this point to get this to heal from a wound care perspective I think this is gena require surgery to be perfectly honest. She voiced understanding and she again does have that appointment. In the meantime the drainage that she has, try to obtain a sample of after cleaning the area to get a fresh sample of the drainage to test for bacteria since I cannot get into the wound on trying do the best I can to try and help manage this until such a time and something  definitively can be done. 07-05-2023 upon evaluation today patient appears to be doing poorly currently in regard to her wound. She has been taking the antibiotic I did prescribe for this was the Augmentin but at this point she tells me that it still has an odor and it still drains quite a bit. There is nothing that I can actually get into even with a skinny probe there is no depth to this but she nonetheless continues to have purulent drainage noted. There is obviously something going on internally. We have made the referral to [facility] wound care center for a plastic surgeon to evaluate and see what they think about the situation here. She goes to see them next Wednesday. Electronic Signature(s) Signed: 07/05/2023 3:11:56 PM By: Allen Derry PA-C Entered By: Allen Derry on 07/05/2023 12:11:56 -------------------------------------------------------------------------------- Physical Exam Details Patient Name: Date of Service: Haley Wyatt 07/05/2023 2:15 PM Medical Record Number: [id number] Patient Account Number: [id number] Date of Birth/Sex: Treating RN: [birth year] (47 y.o. F) Primary Care Provider: Dulce Sellar Other Clinician: Referring Provider: Treating Provider/Extender: Milbert Coulter in Treatment: 12 Constitutional Well-nourished and well-hydrated in no acute distress. Respiratory normal breathing without difficulty. Psychiatric this patient is able to make decisions and demonstrates good insight into disease process. Alert and Oriented x 3. pleasant and cooperative. Notes Upon evaluation patient's wound unfortunately continues to drain though there is nothing I can actually see  at this point it is pretty much closed I cannot even get a skinny probe in but at the same time she had purulent drainage that continues to see from the area in question. Electronic Signature(s) Signed: 07/05/2023 3:12:35 PM By: Allen Derry PA-C Entered By: Allen Derry on  07/05/2023 12:12:34 -------------------------------------------------------------------------------- Physician Orders Details Patient Name: Date of Service: Haley Wyatt, [name] 07/05/2023 2:15 PM Medical Record Number: [id number] Patient Account Number: [id number] Date of Birth/Sex: Treating RN: [birth year] (47 y.o. Arta Silence Primary Care Provider: Dulce Sellar Other Clinician: Referring Provider: Treating Provider/Extender: Milbert Coulter in Treatment: 12 Verbal / Phone Orders: No Diagnosis Coding ICD-10 Coding Code Description T81.31XA Disruption of external operation (surgical) wound, not elsewhere classified, initial encounter Hellinger, Jozlyn ([id number]) [phone] Page 3 of 6 L98.492 Non-pressure chronic ulcer of skin of other sites with fat layer exposed [facility]charge From [facility] Discharge from Wound Care Center - Follow with [facility] wound center on 07/11/2023. Will discharge for now from [facility] wound center. Wound Treatment Wound #1R - Umbilicus Cleanser: Soap and Water 1 x Per Day/30 Days Discharge Instructions: May shower and wash wound with dial antibacterial soap and water prior to dressing change. Secondary Dressing: Zetuvit Plus Silicone Border Dressing 4x4 (in/in) (Generic) 1 x Per Day/30 Days Discharge Instructions: Apply silicone border or bordered gauze. Electronic Signature(s) Unsigned Entered By: Shawn Stall on 07/05/2023 11:41:41 -------------------------------------------------------------------------------- Problem List Details Patient Name: Date of Service: Haley Wyatt 07/05/2023 2:15 PM Medical Record Number: [id number] Patient Account Number: [id number] Date of Birth/Sex: Treating RN: 1976-08-19 (47 y.o. F) Primary Care Provider: Dulce Sellar Other Clinician: Referring Provider: Treating Provider/Extender: Milbert Coulter in Treatment: 12 Active  Problems ICD-10 Encounter Code Description Active Date MDM Diagnosis T81.31XA Disruption of external operation (surgical) wound, not elsewhere classified, 04/12/2023 No Yes initial encounter L98.492 Non-pressure chronic ulcer of skin of other sites with fat layer exposed 04/12/2023 No Yes Inactive Problems Resolved Problems Electronic Signature(s) Signed: 07/05/2023 2:34:40 PM By: Allen Derry PA-C Entered By: Allen Derry on 07/05/2023 11:34:40 -------------------------------------------------------------------------------- Progress Note Details Patient Name: Date of Service: Haley Wyatt, [name] 07/05/2023 2:15 PM Medical Record Number: [id number] Patient Account Number: [id number] Date of Birth/Sex: Treating RN: 1975-11-08 (47 y.o. Kaliyan Hoppe, Allen ([id number]) [id number] Page 4 of 6 Primary Care Provider: Dulce Sellar Other Clinician: Referring Provider: Treating Provider/Extender: Milbert Coulter in Treatment: 12 Subjective Chief Complaint Information obtained from Patient Umbilical surgical ulcer s/p abdominoplasty History of Present Illness (HPI) 04-12-2023 upon evaluation today patient appears to be doing poorly today with regard to her wound. She subsequently has an umbilical ulcer which is actually secondary to surgery. She had a extensive tummy tuck which was actually performed abroad and subsequently they did an extremely good job with that which is awesome. With that being said she unfortunately has several sutures that are actually embedded in the umbilicus region which she was not able to get anyone to take out at her previous clinic where she has been being seen. With that being said other than the swollen area everything else has been healing nicely. Nonetheless I do believe this is actually somewhat deep-rooted beyond where just the sutures need to be removed to be honest. 04-26-2023 upon evaluation today patient  appears to be doing well currently in regard to her umbilical ulcer. This is a surgical wound and does seem to be doing much better. Fortunately I do not see any evidence of  active infection locally nor systemically which is great news. No fevers, chills, nausea, vomiting, or diarrhea. 05-03-2023 upon evaluation patient appears to be doing excellent in regard to her wound which is actually measuring significantly smaller compared to even last week's evaluation. In fact I think the majority of the sidewalls are actually healed there is just a very tiny area in the base of the wound that might still be open. Nonetheless she wants to use the plug for the umbilical area in order to ensure that the umbilical region does not completely closed up and heals internally. For that reason I believe that she could do this at this point but I would want to use a little bit of mupirocin on the plug that she is make sure to clean it well between times that she puts this in. She voiced understanding. 05-10-2023 upon evaluation today patient appears to be doing well currently in regard to her wound which actually appears to be completely healed. I am extremely happy for her knows she is extremely happy as well. 06-07-2023 upon evaluation today patient is here for reevaluation and its actually been 1 month although since I last saw her. At that point she had healed in regard to the area of the umbilical region. Unfortunately in the central area where the abdominis rectus muscles had separated and there was a hematoma previous back in May on the 15th 2024 this was after she lifted a child and felt something pop in this area following her surgery. Nonetheless I think this area of hematoma now is an opening that unfortunately became infected when she first noted this a little over a week ago. She went to urgent care she was given doxycycline and seems to be doing some better although is not completely resolved. Unfortunately  there is quite a bit of undermining I was barely able to get a skinny probe in but once I did there was significant undermining underneath which makes me think that this is likely an abscess or seroma neither which were to be able to manage here in wound care due to the fact that I cannot get into the wound area. 06-14-2023 upon evaluation today patient appears to be doing well currently in regard to her wound from the standpoint of this not being worse but is also not better. She still has the pinpoint opening which we cannot even really packed anything into that goes down about 3.4 cm. Fortunately there does not appear to be any signs of infection at this time which is good news systemically though locally we have been treating her for an infection here. She does seem to be tolerating the dressing changes without complication. 06-28-2023 upon evaluation today patient's wound in the umbilical area unfortunately is not even accessible with a skinny probe. She does have drainage here but I am unsure exactly what is going on underneath. She does have the appointment with wound care/plastic surgery coming up on the 16th that is next week. In the meantime as it stands right now I believe that the patient is going to require some type of intervention there is absolutely nothing I can do at this point to get this to heal from a wound care perspective I think this is gena require surgery to be perfectly honest. She voiced understanding and she again does have that appointment. In the meantime the drainage that she has, try to obtain a sample of after cleaning the area to get a fresh sample of the drainage  to test for bacteria since I cannot get into the wound on trying do the best I can to try and help manage this until such a time and something definitively can be done. 07-05-2023 upon evaluation today patient appears to be doing poorly currently in regard to her wound. She has been taking the antibiotic I did  prescribe for this was the Augmentin but at this point she tells me that it still has an odor and it still drains quite a bit. There is nothing that I can actually get into even with a skinny probe there is no depth to this but she nonetheless continues to have purulent drainage noted. There is obviously something going on internally. We have made the referral to [facility] wound care center for a plastic surgeon to evaluate and see what they think about the situation here. She goes to see them next Wednesday. Objective Constitutional Well-nourished and well-hydrated in no acute distress. Vitals Time Taken: 2:33 PM, Temperature: 99.1 F, Pulse: 88 bpm, Respiratory Rate: 20 breaths/min, Blood Pressure: 124/81 mmHg. Respiratory normal breathing without difficulty. Psychiatric this patient is able to make decisions and demonstrates good insight into disease process. Alert and Oriented x 3. pleasant and cooperative. General Notes: Upon evaluation patient's wound unfortunately continues to drain though there is nothing I can actually see at this point it is pretty much closed I cannot even get a skinny probe in but at the same time she had purulent drainage that continues to see from the area in question. Integumentary (Hair, Skin) Wound #1R status is Open. Original cause of wound was Surgical Injury. The date acquired was: 02/17/2023. The wound has been in treatment 12 weeks. The wound is located on the Umbilicus. The wound measures 0.1cm length x 0.1cm width x 0.2cm depth; 0.008cm^2 area and 0.002cm^3 volume. There is no tunneling or undermining noted. There is a medium amount of sanguinous drainage noted. Foul odor after cleansing was noted. The wound margin is distinct with the outline attached to the wound base. There is no granulation within the wound bed. There is no necrotic tissue within the wound bed. The periwound skin appearance had no abnormalities noted for moisture. The periwound skin  appearance exhibited: Excoriation. The periwound skin appearance did not exhibit: Wyatt, Haley ([id number]) [id number] Page 5 of 6 Callus, Crepitus, Induration, Rash, Scarring, Atrophie Blanche, Cyanosis, Ecchymosis, Hemosiderin Staining, Mottled, Pallor, Rubor, Erythema. Periwound temperature was noted as No Abnormality. Assessment Active Problems ICD-10 Disruption of external operation (surgical) wound, not elsewhere classified, initial encounter Non-pressure chronic ulcer of skin of other sites with fat layer exposed Plan Discharge From [facility] Services: Discharge from Wound Care Center - Follow with [facility] wound center on 07/11/2023. Will discharge for now from [facility] wound center. WOUND #1R: - Umbilicus Wound Laterality: Cleanser: Soap and Water 1 x Per Day/30 Days Discharge Instructions: May shower and wash wound with dial antibacterial soap and water prior to dressing change. Secondary Dressing: Zetuvit Plus Silicone Border Dressing 4x4 (in/in) (Generic) 1 x Per Day/30 Days Discharge Instructions: Apply silicone border or bordered gauze. 1. Based on what I am seeing I do believe that the patient should monitor for any evidence of infection or worsening overall. I do believe that she is making good headway towards getting this completely resolved. With that being said she does have an appointment with plastic surgery at [facility] next Wednesday. 2. I am going to recommend as well that the patient should continue to monitor for any signs of worsening  if anything changes she should going to the ER ASAP. 3. I am also can recommend that for now she should continue with the Augmentin to try to help keep this as under control as possible until such a time as she can get the situation corrected. Will see her back for a follow-up visit as needed as she will be transferring care to [facility] for a plastic surgery consult and recommendations. Electronic  Signature(s) Signed: 07/05/2023 3:13:43 PM By: Allen Derry PA-C Entered By: Allen Derry on 07/05/2023 12:13:42 -------------------------------------------------------------------------------- SuperBill Details Patient Name: Date of Service: Haley Wyatt, [name] 07/05/2023 Medical Record Number: [id number] Patient Account Number: [id number] Date of Birth/Sex: Treating RN: 03/27/1976 (47 y.o. Arta Silence Primary Care Provider: Dulce Sellar Other Clinician: Referring Provider: Treating Provider/Extender: Milbert Coulter in Treatment: 12 Diagnosis Coding ICD-10 Codes Code Description T81.31XA Disruption of external operation (surgical) wound, not elsewhere classified, initial encounter L98.492 Non-pressure chronic ulcer of skin of other sites with fat layer exposed Facility Procedures : CPT4 Code: [id number] Description: 99213 - WOUND CARE VISIT-LEV 3 EST PT Modifier: Quantity: 1 Physician Procedures : CPT4 Code Description Modifier [id number] 99213 - WC PHYS LEVEL 3 - EST PT Scaife, Maury ([id number]) [id number]. Quantity: 1 pdf Page 6 of 6 : ICD-10 Diagnosis Description T81.31XA Disruption of external operation (surgical) wound, not elsewhere classified, initial encounter L98.492 Non-pressure chronic ulcer of skin of other sites with fat layer exposed Quantity: Electronic Signature(s) Signed: 07/05/2023 3:13:53 PM By: Allen Derry PA-C Entered By: Allen Derry on 07/05/2023 12:13:53

## 2023-07-06 NOTE — Progress Notes (Signed)
Callender, Mliss ([id number]) [id number] Page 1 of 6 Visit Report for 07/05/2023 Arrival Information Details Patient Name: Date of Service: Haley Wyatt 07/05/2023 2:15 PM Medical Record Number: [id number] Patient Account Number: [id number] Date of Birth/Sex: Treating RN: July 07, 1976 (47 y.o. [name], Yvonne Kendall Primary Care Ladale Sherburn: Dulce Sellar Other Clinician: Referring Anasha Perfecto: Treating Shemicka Cohrs/Extender: Milbert Coulter in Treatment: 12 Visit Information History Since Last Visit Added or deleted any medications: No Patient Arrived: Ambulatory Any new allergies or adverse reactions: No Arrival Time: 14:32 Had a fall or experienced change in No Accompanied By: self activities of daily living that may affect Transfer Assistance: None risk of falls: Patient Identification Verified: Yes Signs or symptoms of abuse/neglect since last visito No Secondary Verification Process Completed: Yes Hospitalized since last visit: No Patient Requires Transmission-Based Precautions: No Implantable device outside of the clinic excluding No Patient Has Alerts: No cellular tissue based products placed in the center since last visit: Has Dressing in Place as Prescribed: Yes Pain Present Now: No Notes odor to unbilical area noted with drainage. Electronic Signature(s) Signed: 07/05/2023 5:54:20 PM By: Shawn Stall RN, BSN Entered By: Shawn Stall on 07/05/2023 14:32:51 -------------------------------------------------------------------------------- Clinic Level of Care Assessment Details Patient Name: Date of Service: Haley Wyatt 07/05/2023 2:15 PM Medical Record Number: [id number] Patient Account Number: [id number] Date of Birth/Sex: Treating RN: [birth year] (47 y.o. [name], Millard.Loa Primary Care Donetta Isaza: Dulce Sellar Other Clinician: Referring Shalunda Lindh: Treating Anacristina Steffek/Extender: Milbert Coulter in Treatment: 12 Clinic Level of Care Assessment Items TOOL 4 Quantity Score X- 1 0 Use when only an EandM is performed on FOLLOW-UP visit ASSESSMENTS - Nursing Assessment / Reassessment X- 1 10 Reassessment of Co-morbidities (includes updates in patient status) X- 1 5 Reassessment of Adherence to Treatment Plan ASSESSMENTS - Wound and Skin A ssessment / Reassessment X - Simple Wound Assessment / Reassessment - one wound 1 5 []  - 0 Complex Wound Assessment / Reassessment - multiple wounds []  - 0 Dermatologic / Skin Assessment (not related to wound area) ASSESSMENTS - Focused Assessment []  - 0 Circumferential Edema Measurements - multi extremities Riera, Laxmi ([id number]) [id number] Page 2 of 6 []  - 0 Nutritional Assessment / Counseling / Intervention []  - 0 Lower Extremity Assessment (monofilament, tuning fork, pulses) []  - 0 Peripheral Arterial Disease Assessment (using hand held doppler) ASSESSMENTS - Ostomy and/or Continence Assessment and Care []  - 0 Incontinence Assessment and Management []  - 0 Ostomy Care Assessment and Management (repouching, etc.) PROCESS - Coordination of Care X - Simple Patient / Family Education for ongoing care 1 15 []  - 0 Complex (extensive) Patient / Family Education for ongoing care X- 1 10 Staff obtains Chiropractor, Records, T Results / Process Orders est []  - 0 Staff telephones HHA, Nursing Homes / Clarify orders / etc []  - 0 Routine Transfer to another Facility (non-emergent condition) []  - 0 Routine Hospital Admission (non-emergent condition) []  - 0 New Admissions / Manufacturing engineer / Ordering NPWT Apligraf, etc. , []  - 0 Emergency Hospital Admission (emergent condition) X- 1 10 Simple Discharge Coordination []  - 0 Complex (extensive) Discharge Coordination PROCESS - Special Needs []  - 0 Pediatric / Minor Patient Management []  - 0 Isolation Patient Management []  -  0 Hearing / Language / Visual special needs []  - 0 Assessment of Community assistance (transportation, D/C planning, etc.) []  - 0 Additional assistance / Altered mentation []  - 0 Support Surface(s) Assessment (bed, cushion, seat, etc.) INTERVENTIONS - Wound  Cleansing / Measurement X - Simple Wound Cleansing - one wound 1 5 []  - 0 Complex Wound Cleansing - multiple wounds X- 1 5 Wound Imaging (photographs - any number of wounds) []  - 0 Wound Tracing (instead of photographs) X- 1 5 Simple Wound Measurement - one wound []  - 0 Complex Wound Measurement - multiple wounds INTERVENTIONS - Wound Dressings X - Small Wound Dressing one or multiple wounds 1 10 []  - 0 Medium Wound Dressing one or multiple wounds []  - 0 Large Wound Dressing one or multiple wounds []  - 0 Application of Medications - topical []  - 0 Application of Medications - injection INTERVENTIONS - Miscellaneous []  - 0 External ear exam []  - 0 Specimen Collection (cultures, biopsies, blood, body fluids, etc.) []  - 0 Specimen(s) / Culture(s) sent or taken to Lab for analysis []  - 0 Patient Transfer (multiple staff / Michiel Sites Lift / Similar devices) []  - 0 Simple Staple / Suture removal (25 or less) []  - 0 Complex Staple / Suture removal (26 or more) Haley Wyatt, Haley Wyatt ([id number]) [id number] Page 3 of 6 []  - 0 Hypo / Hyperglycemic Management (close monitor of Blood Glucose) []  - 0 Ankle / Brachial Index (ABI) - do not check if billed separately X- 1 5 Vital Signs Has the patient been seen at the hospital within the last three years: Yes Total Score: 85 Level Of Care: New/Established - Level 3 Electronic Signature(s) Signed: 07/05/2023 5:54:20 PM By: Shawn Stall RN, BSN Entered By: Shawn Stall on 07/05/2023 14:43:40 -------------------------------------------------------------------------------- Encounter Discharge Information Details Patient Name: Date of Service: Haley Wyatt, [place]  NICA 07/05/2023 2:15 PM Medical Record Number: [id number] Patient Account Number: [id number] Date of Birth/Sex: Treating RN: December 26, 1975 (47 y.o. Arta Silence Primary Care Li Bobo: Dulce Sellar Other Clinician: Referring Shamya Macfadden: Treating Jodell Weitman/Extender: Milbert Coulter in Treatment: 12 Encounter Discharge Information Items Discharge Condition: Stable Ambulatory Status: Ambulatory Discharge Destination: Home Transportation: Private Auto Accompanied By: self Schedule Follow-up Appointment: No Clinical Summary of Care: Electronic Signature(s) Signed: 07/05/2023 5:54:20 PM By: Shawn Stall RN, BSN Entered By: Shawn Stall on 07/05/2023 14:44:23 -------------------------------------------------------------------------------- Lower Extremity Assessment Details Patient Name: Date of Service: Haley Wyatt, [name] 07/05/2023 2:15 PM Medical Record Number: [id number] Patient Account Number: [id number] Date of Birth/Sex: Treating RN: [birth year] (47 y.o. Arta Silence Primary Care Makinlee Awwad: Dulce Sellar Other Clinician: Referring Salisha Bardsley: Treating Arsenio Schnorr/Extender: Milbert Coulter in Treatment: 12 Electronic Signature(s) Signed: 07/05/2023 5:54:20 PM By: Shawn Stall RN, BSN Entered By: Shawn Stall on 07/05/2023 14:35:47 Haley Wyatt, Haley Wyatt ([id number]) [id number] Page 4 of 6 -------------------------------------------------------------------------------- Multi-Disciplinary Care Plan Details Patient Name: Date of Service: Haley Wyatt 07/05/2023 2:15 PM Medical Record Number: [id number] Patient Account Number: [id number] Date of Birth/Sex: Treating RN: 04/08/1976 (47 y.o. Arta Silence Primary Care Jennene Downie: Dulce Sellar Other Clinician: Referring Kamela Blansett: Treating Verniece Encarnacion/Extender: Milbert Coulter in Treatment: 12 Active Inactive Electronic  Signature(s) Signed: 07/05/2023 5:54:20 PM By: Shawn Stall RN, BSN Entered By: Shawn Stall on 07/05/2023 14:42:18 -------------------------------------------------------------------------------- Pain Assessment Details Patient Name: Date of Service: Haley Wyatt, [name] 07/05/2023 2:15 PM Medical Record Number: [id number] Patient Account Number: [id number] Date of Birth/Sex: Treating RN: 08-17-1976 (47 y.o. Arta Silence Primary Care Deneka Greenwalt: Dulce Sellar Other Clinician: Referring Dencil Cayson: Treating Madolyn Ackroyd/Extender: Milbert Coulter in Treatment: 12 Active Problems Location of Pain Severity and Description of Pain Patient Has Paino No Site Locations Pain Management and Medication Current Pain Management: Electronic  Signature(s) Signed: 07/05/2023 5:54:20 PM By: Shawn Stall RN, BSN Entered By: Shawn Stall on 07/05/2023 14:35:38 Haley Wyatt, Haley Wyatt ([id number]) [id number] Page 5 of 6 -------------------------------------------------------------------------------- Wound Assessment Details Patient Name: Date of Service: Haley Wyatt 07/05/2023 2:15 PM Medical Record Number: [id number] Patient Account Number: [id number] Date of Birth/Sex: Treating RN: June 22, 1976 (47 y.o. [name], Yvonne Kendall Primary Care Louna Rothgeb: Dulce Sellar Other Clinician: Referring Jailan Trimm: Treating Mera Gunkel/Extender: Milbert Coulter in Treatment: 12 Wound Status Wound Number: [id number] Primary Etiology: Abscess Wound Location: Umbilicus Wound Status: Open Wounding Event: Surgical Injury Comorbid History: Hypertension Date Acquired: 02/17/2023 Weeks Of Treatment: 12 Clustered Wound: No Photos Wound Measurements Length: (cm) 0.1 Width: (cm) 0.1 Depth: (cm) 0.2 Area: (cm) 0.008 Volume: (cm) 0.002 % Reduction in Area: 97.2% % Reduction in Volume: 99.8% Epithelialization: Large (67-100%) Tunneling: No Undermining:  No Wound Description Classification: Full Thickness Without Exposed Support Wound Margin: Distinct, outline attached Exudate Amount: Medium Exudate Type: Sanguinous Exudate Color: red Structures Foul Odor After Cleansing: Yes Due to Product Use: No Slough/Fibrino Yes Wound Bed Granulation Amount: None Present (0%) Exposed Structure Necrotic Amount: None Present (0%) Fascia Exposed: No Fat Layer (Subcutaneous Tissue) Exposed: No Tendon Exposed: No Muscle Exposed: No Joint Exposed: No Bone Exposed: No Periwound Skin Texture Texture Color No Abnormalities Noted: No No Abnormalities Noted: No Callus: No Atrophie Blanche: No Crepitus: No Cyanosis: No Excoriation: Yes Ecchymosis: No Induration: No Erythema: No Rash: No Hemosiderin Staining: No Scarring: No Mottled: No Pallor: No Moisture Rubor: No No Abnormalities Noted: Yes Temperature / Pain Temperature: No Abnormality Electronic Signature(s) Signed: 07/05/2023 5:54:20 PM By: Shawn Stall RN, BSN Entered By: Shawn Stall on 07/05/2023 14:37:46 Haley Wyatt, Haley Wyatt ([id number]) [id number] Page 6 of 6 -------------------------------------------------------------------------------- Vitals Details Patient Name: Date of Service: Haley Wyatt 07/05/2023 2:15 PM Medical Record Number: [id number] Patient Account Number: [id number] Date of Birth/Sex: Treating RN: 1976/03/06 (47 y.o. [name], Yvonne Kendall Primary Care Kimorah Ridolfi: Dulce Sellar Other Clinician: Referring Mackena Plummer: Treating Gagan Dillion/Extender: Milbert Coulter in Treatment: 12 Vital Signs Time Taken: 14:33 Temperature (F): 99.1 Pulse (bpm): 88 Respiratory Rate (breaths/min): 20 Blood Pressure (mmHg): 124/81 Reference Range: 80 - 120 mg / dl Electronic Signature(s) Signed: 07/05/2023 5:54:20 PM By: Shawn Stall RN, BSN Entered By: Shawn Stall on 07/05/2023 14:34:03

## 2023-07-10 ENCOUNTER — Other Ambulatory Visit: Payer: Self-pay | Admitting: Family

## 2023-07-10 DIAGNOSIS — F988 Other specified behavioral and emotional disorders with onset usually occurring in childhood and adolescence: Secondary | ICD-10-CM

## 2023-07-11 DIAGNOSIS — Y838 Other surgical procedures as the cause of abnormal reaction of the patient, or of later complication, without mention of misadventure at the time of the procedure: Secondary | ICD-10-CM | POA: Diagnosis not present

## 2023-07-11 DIAGNOSIS — Z9889 Other specified postprocedural states: Secondary | ICD-10-CM | POA: Diagnosis not present

## 2023-07-11 DIAGNOSIS — K9589 Other complications of other bariatric procedure: Secondary | ICD-10-CM | POA: Diagnosis not present

## 2023-07-11 DIAGNOSIS — Z9884 Bariatric surgery status: Secondary | ICD-10-CM | POA: Diagnosis not present

## 2023-07-11 DIAGNOSIS — T8130XD Disruption of wound, unspecified, subsequent encounter: Secondary | ICD-10-CM | POA: Diagnosis not present

## 2023-07-11 DIAGNOSIS — T8130XA Disruption of wound, unspecified, initial encounter: Secondary | ICD-10-CM | POA: Diagnosis not present

## 2023-07-11 NOTE — Telephone Encounter (Signed)
Hey Haley Wyatt - so remember - her last OV for ADHD was in June - so she is do a visit - ok if virtual of last visit in office, thx

## 2023-07-14 ENCOUNTER — Telehealth (INDEPENDENT_AMBULATORY_CARE_PROVIDER_SITE_OTHER): Payer: 59 | Admitting: Family

## 2023-07-14 DIAGNOSIS — I10 Essential (primary) hypertension: Secondary | ICD-10-CM | POA: Diagnosis not present

## 2023-07-14 DIAGNOSIS — F988 Other specified behavioral and emotional disorders with onset usually occurring in childhood and adolescence: Secondary | ICD-10-CM | POA: Diagnosis not present

## 2023-07-14 MED ORDER — LOSARTAN POTASSIUM 25 MG PO TABS
25.0000 mg | ORAL_TABLET | Freq: Two times a day (BID) | ORAL | 1 refills | Status: DC
Start: 2023-07-14 — End: 2024-01-01

## 2023-07-14 MED ORDER — LISDEXAMFETAMINE DIMESYLATE 30 MG PO CAPS
30.0000 mg | ORAL_CAPSULE | Freq: Every day | ORAL | 0 refills | Status: DC
Start: 2023-07-14 — End: 2023-12-20

## 2023-07-14 MED ORDER — LISDEXAMFETAMINE DIMESYLATE 30 MG PO CAPS
30.0000 mg | ORAL_CAPSULE | Freq: Every day | ORAL | 0 refills | Status: DC
Start: 2023-08-13 — End: 2023-12-20

## 2023-07-14 MED ORDER — LISDEXAMFETAMINE DIMESYLATE 30 MG PO CAPS
30.0000 mg | ORAL_CAPSULE | Freq: Every day | ORAL | 0 refills | Status: DC
Start: 2023-09-13 — End: 2023-10-06

## 2023-07-14 NOTE — Progress Notes (Signed)
MyChart Video Visit    Virtual Visit via Video Note   This format is felt to be most appropriate for this patient at this time. Physical exam was limited by quality of the video and audio technology used for the visit. CMA was able to get the patient set up on a video visit.  Patient location: Home. Patient and provider in visit Provider location: Office  I discussed the limitations of evaluation and management by telemedicine and the availability of in person appointments. The patient expressed understanding and agreed to proceed.  Visit Date: 07/14/2023  Today's healthcare provider: Dulce Sellar, NP     Subjective:   Patient ID: Haley Wyatt, female    DOB: 1976-04-20, 47 y.o.   MRN: [id number]  Chief Complaint  Patient presents with   ADHD    HPI Hypertension: Patient is currently maintained on the following medications for blood pressure: Losartan 25mg  daily; reports home readings of upper 150s/90s. Failed meds include: none Patient reports good compliance with blood pressure medications. Patient denies chest pain, headaches, shortness of breath or swelling. Last 3 blood pressure readings in our office are as follows: BP Readings from Last 3 Encounters:  05/31/23 (!) 126/92  05/22/23 124/78  03/30/23 135/87    ADHD f/u: Medications helping target goals: switched to Vyvanse last vist, 30mg  due to irritability, sleeplessness Regimen: daily Medication side effects/concerns: pt reports less irritability and sleeplessness back on Vyvanse, does not take med on weekends Weight: No change Sleep: Sometimes Mood changes: none Tics: Denies Blood pressure, Weight, Pulse, Behavior reviewed: wnl  Assessment & Plan:  Essential hypertension Assessment & Plan: chronic taking Losartan 25mg  daily pt has checked a few times lately and it has been running higher in 150s/90s advised to increase dose to bid advised to decrease caffeine intake, be sure hydrating w/2L  water daily, eat low sodium diet (reports she has been eating more take out food) increase exercise as able sending refill f/u 3 mos  Orders: -     Losartan Potassium; Take 1 tablet (25 mg total) by mouth 2 (two) times daily.  Dispense: 180 tablet; Refill: 1  Attention deficit disorder, unspecified hyperactivity presence Assessment & Plan: Chronic taking Vyvanse 30 mg, states she is doing better back on this & off adderall, does not want to increase the dose reports higher BP, Vyvanse may be contributing slightly, but still on low dose Sending refills Follow-up in 3 months  Orders: -     Lisdexamfetamine Dimesylate; Take 1 capsule (30 mg total) by mouth daily before breakfast.  Dispense: 30 capsule; Refill: 0 -     Lisdexamfetamine Dimesylate; Take 1 capsule (30 mg total) by mouth daily before breakfast.  Dispense: 30 capsule; Refill: 0 -     Lisdexamfetamine Dimesylate; Take 1 capsule (30 mg total) by mouth daily before breakfast.  Dispense: 30 capsule; Refill: 0    Past Medical History:  Diagnosis Date   ADD (attention deficit disorder)    ADHD    Allergy    Back pain    "muscle strain from exercising"   Back pain    Borderline diabetes    states "levels have been about 102"   Chest pain    Chronic headaches    Constipation    Corneal ulceration 06/23/2021   Dysmenorrhea 01/28/2015   Fibroid tumor    inside uterus   GERD 06/11/2010   Qualifier: Diagnosis of  By: Benjamin Stain MD, Maisie Fus     GERD (gastroesophageal reflux disease)  PT STATES "DOES NOT HAVE IT NOW"   H/O hiatal hernia    Heel pain, bilateral 06/23/2021   Hypertension    NO MEDS.. BP CONTROLLED   Hypothyroidism    HYPOTHYROIDISM 04/16/2009   Qualifier: Diagnosis of  By: Benjamin Stain MD, Thomas     Joint pain    LEG EDEMA, 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   Qualifier: Diagnosis of  By: Benjamin Stain MD, Thomas     OBESITY 04/16/2009   Other fatigue 08/18/2020   Other specified hypothyroidism 08/18/2020    Sedimentation rate elevation 06/23/2021   SOB (shortness of breath) on exertion    Swelling of both lower extremities    Urge incontinence 05/02/2022   Past Surgical History:  Procedure Laterality Date   ABDOMINAL HYSTERECTOMY     ABDOMINOPLASTY/PANNICULECTOMY WITH LIPOSUCTION  02/15/2023   surgery in [place]   BILATERAL SALPINGECTOMY Bilateral 01/28/2015   Procedure: BILATERAL SALPINGECTOMY;  Surgeon: Hal Morales, MD;  Location: [facility];  Service: Gynecology;  Laterality: Bilateral;   CARPAL TUNNEL RELEASE Right    DILITATION & CURRETTAGE/HYSTROSCOPY WITH HYDROTHERMAL ABLATION N/A 04/16/2014   Procedure: DILATATION & CURETTAGE/HYSTEROSCOPY WITH HYDROTHERMAL ABLATION;  Surgeon: Kathreen Cosier, MD;  Location: [facility];  Service: Gynecology;  Laterality: N/A;   HIATAL HERNIA REPAIR N/A 02/11/2014   Procedure: LAPAROSCOPIC REPAIR OF HIATAL HERNIA;  Surgeon: Valarie Merino, MD;  Location: [facility];  Service: General;  Laterality: N/A;   LAPAROSCOPIC GASTRIC SLEEVE RESECTION N/A 02/11/2014   Procedure: LAPAROSCOPIC GASTRIC SLEEVE RESECTION;  Surgeon: Valarie Merino, MD;  Location: [facility];  Service: General;  Laterality: N/A;   SHOULDER SURGERY     left shoulder loose body removal   SUPRACERVICAL ABDOMINAL HYSTERECTOMY Bilateral 01/28/2015   Procedure: TOTAL SUPER CERVICAL ABDOMINAL HYSTERECTOMY BILATERAL SALPINGECTOMY;  Surgeon: Hal Morales, MD;  Location: [facility];  Service: Gynecology;  Laterality: Bilateral;   TUBAL LIGATION     UPPER GI ENDOSCOPY  02/11/2014   Procedure: UPPER GI ENDOSCOPY;  Surgeon: Valarie Merino, MD;  Location: [facility];  Service: General;;    Outpatient Medications Prior to Visit  Medication Sig Dispense Refill   busPIRone (BUSPAR) 5 MG tablet Take 1-2 tablets (5-10 mg total) by mouth 3 (three) times daily as needed (Take 3 times daily for 1-2 weeks at least, then can back down to twice a day or daily use.). 60 tablet 5   hydrocortisone  (ANUSOL-HC) 25 MG suppository INSERT 1 SUPPOSITORY RECTALLY TWICE DAILY 12 suppository 1   Hydrocortisone, Perianal, (PREPARATION H) 1 % CREA Apply 1 Application topically as directed. After every BM. 30 g 1   ketoconazole (NIZORAL) 2 % cream Apply 1 application topically daily. 30 g 3   metFORMIN (GLUCOPHAGE) 1000 MG tablet Take 0.5 tablets (500 mg total) by mouth daily with breakfast. 90 tablet 1   metroNIDAZOLE (METROGEL) 0.75 % vaginal gel Place 1 Applicatorful vaginally at bedtime. 70 g 0   MOUNJARO 7.5 MG/0.5ML Pen INJECT 1/2 (ONE-HALF) ML INTO THE SKIN  ONCE A WEEK 12 mL 0   Multiple Vitamins-Minerals (MULTI-VITE) LIQD Take 30 mLs by mouth daily.     doxycycline (VIBRAMYCIN) 100 MG capsule Take 1 capsule (100 mg total) by mouth 2 (two) times daily. 20 capsule 0   fluconazole (DIFLUCAN) 150 MG tablet Take 1 pill today and the 2nd pill in 3 days. 2 tablet 0   losartan (COZAAR) 25 MG tablet Take 1 tablet (25 mg total) by mouth daily. 90 tablet 1   lisdexamfetamine (  VYVANSE) 30 MG capsule Take 1 capsule (30 mg total) by mouth daily before breakfast. 30 capsule 0   lisdexamfetamine (VYVANSE) 30 MG capsule Take 1 capsule (30 mg total) by mouth daily before breakfast. 30 capsule 0   lisdexamfetamine (VYVANSE) 30 MG capsule Take 1 capsule (30 mg total) by mouth daily before breakfast. 30 capsule 0   No facility-administered medications prior to visit.    No Known Allergies     Objective:   Physical Exam Vitals and nursing note reviewed.  Constitutional:      General: Pt is not in acute distress.    Appearance: Normal appearance.  HENT:     Head: Normocephalic.  Pulmonary:     Effort: No respiratory distress.  Musculoskeletal:     Cervical back: Normal range of motion.  Skin:    General: Skin is dry.     Coloration: Skin is not pale.  Neurological:     Mental Status: Pt is alert and oriented to person, place, and time.  Psychiatric:        Mood and Affect: Mood normal.   LMP  04/28/2014   Wt Readings from Last 3 Encounters:  05/31/23 245 lb (111.1 kg)  05/22/23 250 lb 12.8 oz (113.8 kg)  03/30/23 273 lb (123.8 kg)      I discussed the assessment and treatment plan with the patient. The patient was provided an opportunity to ask questions and all were answered. The patient agreed with the plan and demonstrated an understanding of the instructions.   The patient was advised to call back or seek an in-person evaluation if the symptoms worsen or if the condition fails to improve as anticipated.  Dulce Sellar, NP [facility] [phone] (phone) [phone] (fax)  [facility]

## 2023-07-14 NOTE — Assessment & Plan Note (Addendum)
Chronic taking Vyvanse 30 mg, states she is doing better back on this & off adderall, does not want to increase the dose reports higher BP, Vyvanse may be contributing slightly, but still on low dose Sending refills Follow-up in 3 months

## 2023-07-14 NOTE — Assessment & Plan Note (Addendum)
chronic taking Losartan 25mg  daily pt has checked a few times lately and it has been running higher in 150s/90s advised to increase dose to bid advised to decrease caffeine intake, be sure hydrating w/2L water daily, eat low sodium diet (reports she has been eating more take out food) increase exercise as able sending refill f/u 3 mos

## 2023-07-17 ENCOUNTER — Telehealth: Payer: 59 | Admitting: Family

## 2023-07-19 DIAGNOSIS — T8130XA Disruption of wound, unspecified, initial encounter: Secondary | ICD-10-CM | POA: Diagnosis not present

## 2023-07-19 DIAGNOSIS — K6389 Other specified diseases of intestine: Secondary | ICD-10-CM | POA: Diagnosis not present

## 2023-07-21 DIAGNOSIS — Z5189 Encounter for other specified aftercare: Secondary | ICD-10-CM | POA: Diagnosis not present

## 2023-07-26 ENCOUNTER — Other Ambulatory Visit (HOSPITAL_COMMUNITY): Payer: Self-pay

## 2023-07-31 DIAGNOSIS — S31105A Unspecified open wound of abdominal wall, periumbilic region without penetration into peritoneal cavity, initial encounter: Secondary | ICD-10-CM | POA: Diagnosis not present

## 2023-07-31 DIAGNOSIS — L859 Epidermal thickening, unspecified: Secondary | ICD-10-CM | POA: Diagnosis not present

## 2023-07-31 DIAGNOSIS — T8131XA Disruption of external operation (surgical) wound, not elsewhere classified, initial encounter: Secondary | ICD-10-CM | POA: Diagnosis not present

## 2023-08-05 ENCOUNTER — Encounter: Payer: Self-pay | Admitting: Family

## 2023-08-05 DIAGNOSIS — K649 Unspecified hemorrhoids: Secondary | ICD-10-CM

## 2023-08-05 DIAGNOSIS — K5900 Constipation, unspecified: Secondary | ICD-10-CM

## 2023-08-08 NOTE — Telephone Encounter (Signed)
[facility], and let pt know - use hemorrhoid & constipation dx, thx

## 2023-08-10 ENCOUNTER — Encounter: Payer: Self-pay | Admitting: Physician Assistant

## 2023-08-16 DIAGNOSIS — Z5189 Encounter for other specified aftercare: Secondary | ICD-10-CM | POA: Diagnosis not present

## 2023-08-18 ENCOUNTER — Other Ambulatory Visit: Payer: Self-pay | Admitting: Family

## 2023-08-18 DIAGNOSIS — F988 Other specified behavioral and emotional disorders with onset usually occurring in childhood and adolescence: Secondary | ICD-10-CM

## 2023-08-20 NOTE — Telephone Encounter (Signed)
Can you send referral to [facility] please.

## 2023-08-20 NOTE — Telephone Encounter (Signed)
Ask pharmacy why they are not keeping al 3 months of refills on file? Sent 3 scripts in September

## 2023-08-21 NOTE — Addendum Note (Signed)
Addended by: Candie Chroman on: 08/21/2023 11:22 AM   Modules accepted: Orders

## 2023-09-09 DIAGNOSIS — E039 Hypothyroidism, unspecified: Secondary | ICD-10-CM | POA: Diagnosis not present

## 2023-09-09 DIAGNOSIS — Z7952 Long term (current) use of systemic steroids: Secondary | ICD-10-CM | POA: Diagnosis not present

## 2023-09-09 DIAGNOSIS — Z818 Family history of other mental and behavioral disorders: Secondary | ICD-10-CM | POA: Diagnosis not present

## 2023-09-09 DIAGNOSIS — Z008 Encounter for other general examination: Secondary | ICD-10-CM | POA: Diagnosis not present

## 2023-09-09 DIAGNOSIS — Z809 Family history of malignant neoplasm, unspecified: Secondary | ICD-10-CM | POA: Diagnosis not present

## 2023-09-09 DIAGNOSIS — Z7984 Long term (current) use of oral hypoglycemic drugs: Secondary | ICD-10-CM | POA: Diagnosis not present

## 2023-09-09 DIAGNOSIS — Z87891 Personal history of nicotine dependence: Secondary | ICD-10-CM | POA: Diagnosis not present

## 2023-09-09 DIAGNOSIS — E119 Type 2 diabetes mellitus without complications: Secondary | ICD-10-CM | POA: Diagnosis not present

## 2023-09-09 DIAGNOSIS — Z8249 Family history of ischemic heart disease and other diseases of the circulatory system: Secondary | ICD-10-CM | POA: Diagnosis not present

## 2023-09-09 DIAGNOSIS — I10 Essential (primary) hypertension: Secondary | ICD-10-CM | POA: Diagnosis not present

## 2023-09-09 DIAGNOSIS — K219 Gastro-esophageal reflux disease without esophagitis: Secondary | ICD-10-CM | POA: Diagnosis not present

## 2023-09-09 DIAGNOSIS — Z833 Family history of diabetes mellitus: Secondary | ICD-10-CM | POA: Diagnosis not present

## 2023-09-09 DIAGNOSIS — F419 Anxiety disorder, unspecified: Secondary | ICD-10-CM | POA: Diagnosis not present

## 2023-09-27 ENCOUNTER — Ambulatory Visit: Payer: 59 | Admitting: Radiology

## 2023-10-04 ENCOUNTER — Other Ambulatory Visit: Payer: Self-pay | Admitting: Family

## 2023-10-04 DIAGNOSIS — F988 Other specified behavioral and emotional disorders with onset usually occurring in childhood and adolescence: Secondary | ICD-10-CM

## 2023-10-05 ENCOUNTER — Telehealth: Payer: Self-pay

## 2023-10-05 ENCOUNTER — Ambulatory Visit: Payer: 59 | Admitting: Gastroenterology

## 2023-10-05 NOTE — Telephone Encounter (Signed)
PT wasn't seen due to being later. PT also established  with [facility] . Pt has appt with [facility] in Jan. Pt states she will go back to see them.

## 2023-10-05 NOTE — Progress Notes (Unsigned)
Gastroenterology Consultation  Referring Provider:     Dulce Sellar, NP Primary Care Physician:  Dulce Sellar, NP Primary Gastroenterologist:  Dr. Servando Snare     Reason for Consultation:     Hemorrhoids and constipation        HPI:   Haley Wyatt is a 47 y.o. y/o female referred for consultation & management of hemorrhoids and constipation by Dr. Dulce Sellar, NP.  This patient comes in today after being referred to both [facility] in [place].  The patient had been seeing in [place] in 2021 and at that time was supposed to be set up for colonoscopy.  The patient had an EGD in 2015 that biopsy showed to be normal gastric tissue.  The upper endoscopy was done by Dr. Daphine Deutscher.  Past Medical History:  Diagnosis Date   ADD (attention deficit disorder)    ADHD    Allergy    Back pain    "muscle strain from exercising"   Back pain    Borderline diabetes    states "levels have been about 102"   Chest pain    Chronic headaches    Constipation    Corneal ulceration 06/23/2021   Dysmenorrhea 01/28/2015   Fibroid tumor    inside uterus   GERD 06/11/2010   Qualifier: Diagnosis of  By: Benjamin Stain MD, Maisie Fus     GERD (gastroesophageal reflux disease)    PT STATES "DOES NOT HAVE IT NOW"   H/O hiatal hernia    Heel pain, bilateral 06/23/2021   Hypertension    NO MEDS.. BP CONTROLLED   Hypothyroidism    HYPOTHYROIDISM 04/16/2009   Qualifier: Diagnosis of  By: Benjamin Stain MD, Thomas     Joint pain    LEG EDEMA, 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   Qualifier: Diagnosis of  By: Benjamin Stain MD, Thomas     OBESITY 04/16/2009   Other fatigue 08/18/2020   Other specified hypothyroidism 08/18/2020   Sedimentation rate elevation 06/23/2021   SOB (shortness of breath) on exertion    Swelling of both lower extremities    Urge incontinence 05/02/2022    Past Surgical History:  Procedure Laterality Date   ABDOMINAL HYSTERECTOMY     ABDOMINOPLASTY/PANNICULECTOMY WITH  LIPOSUCTION  02/15/2023   surgery in [place]   BILATERAL SALPINGECTOMY Bilateral 01/28/2015   Procedure: BILATERAL SALPINGECTOMY;  Surgeon: Hal Morales, MD;  Location: [facility];  Service: Gynecology;  Laterality: Bilateral;   CARPAL TUNNEL RELEASE Right    DILITATION & CURRETTAGE/HYSTROSCOPY WITH HYDROTHERMAL ABLATION N/A 04/16/2014   Procedure: DILATATION & CURETTAGE/HYSTEROSCOPY WITH HYDROTHERMAL ABLATION;  Surgeon: Kathreen Cosier, MD;  Location: [facility];  Service: Gynecology;  Laterality: N/A;   HIATAL HERNIA REPAIR N/A 02/11/2014   Procedure: LAPAROSCOPIC REPAIR OF HIATAL HERNIA;  Surgeon: Valarie Merino, MD;  Location: [facility];  Service: General;  Laterality: N/A;   LAPAROSCOPIC GASTRIC SLEEVE RESECTION N/A 02/11/2014   Procedure: LAPAROSCOPIC GASTRIC SLEEVE RESECTION;  Surgeon: Valarie Merino, MD;  Location: [facility];  Service: General;  Laterality: N/A;   SHOULDER SURGERY     left shoulder loose body removal   SUPRACERVICAL ABDOMINAL HYSTERECTOMY Bilateral 01/28/2015   Procedure: TOTAL SUPER CERVICAL ABDOMINAL HYSTERECTOMY BILATERAL SALPINGECTOMY;  Surgeon: Hal Morales, MD;  Location: [facility];  Service: Gynecology;  Laterality: Bilateral;   TUBAL LIGATION     UPPER GI ENDOSCOPY  02/11/2014   Procedure: UPPER GI ENDOSCOPY;  Surgeon: Valarie Merino, MD;  Location: [facility];  Service: General;;  Prior to Admission medications   Medication Sig Start Date End Date Taking? Authorizing Provider  busPIRone (BUSPAR) 5 MG tablet Take 1-2 tablets (5-10 mg total) by mouth 3 (three) times daily as needed (Take 3 times daily for 1-2 weeks at least, then can back down to twice a day or daily use.). 03/30/23   Dulce Sellar, NP  hydrocortisone (ANUSOL-HC) 25 MG suppository INSERT 1 SUPPOSITORY RECTALLY TWICE DAILY 05/22/23   Dulce Sellar, NP  Hydrocortisone, Perianal, (PREPARATION H) 1 % CREA Apply 1 Application topically as directed. After every BM. 05/29/23   Dulce Sellar, NP  ketoconazole (NIZORAL) 2 % cream Apply 1 application topically daily. 10/12/21   Janeece Agee, NP  lisdexamfetamine (VYVANSE) 30 MG capsule Take 1 capsule (30 mg total) by mouth daily before breakfast. 09/13/23 10/13/23  Dulce Sellar, NP  lisdexamfetamine (VYVANSE) 30 MG capsule Take 1 capsule (30 mg total) by mouth daily before breakfast. 07/14/23 08/13/23  Dulce Sellar, NP  lisdexamfetamine (VYVANSE) 30 MG capsule Take 1 capsule (30 mg total) by mouth daily before breakfast. 08/13/23 09/12/23  Dulce Sellar, NP  losartan (COZAAR) 25 MG tablet Take 1 tablet (25 mg total) by mouth 2 (two) times daily. 07/14/23   Dulce Sellar, NP  metFORMIN (GLUCOPHAGE) 1000 MG tablet Take 0.5 tablets (500 mg total) by mouth daily with breakfast. 03/30/23   Dulce Sellar, NP  metroNIDAZOLE (METROGEL) 0.75 % vaginal gel Place 1 Applicatorful vaginally at bedtime. 01/05/23   Chrzanowski, Jami B, NP  MOUNJARO 7.5 MG/0.5ML Pen INJECT 1/2 (ONE-HALF) ML INTO THE SKIN  ONCE A WEEK 06/23/23   Dulce Sellar, NP  Multiple Vitamins-Minerals (MULTI-VITE) LIQD Take 30 mLs by mouth daily.    [provider]    Family History  Problem Relation Age of Onset   Diverticulitis Mother    Alcoholism Mother    Hyperlipidemia Father    Hypertension Father    Diabetes Father    Schizophrenia Father    Depression Father    Mental illness Sister    Drug abuse Sister    Colon cancer Maternal Grandmother    Diabetes Paternal Grandmother    Sudden death Neg Hx    Heart attack Neg Hx      Social History   Tobacco Use   Smoking status: Former    Current packs/day: 0.00    Types: Cigarettes    Quit date: 02/05/2006    Years since quitting: 17.6   Smokeless tobacco: Never  Vaping Use   Vaping status: Never Used  Substance Use Topics   Alcohol use: Not Currently    Comment: Occasionally   Drug use: No    Allergies as of 10/05/2023   (No Known Allergies)    Review of  Systems:    All systems reviewed and negative except where noted in HPI.   Physical Exam:  LMP 04/28/2014  Patient's last menstrual period was 04/28/2014. General:   Alert,  Well-developed, well-nourished, pleasant and cooperative in NAD Head:  Normocephalic and atraumatic. Eyes:  Sclera clear, no icterus.   Conjunctiva pink. Ears:  Normal auditory acuity. Neck:  Supple; no masses or thyromegaly. Lungs:  Respirations even and unlabored.  Clear throughout to auscultation.   No wheezes, crackles, or rhonchi. No acute distress. Heart:  Regular rate and rhythm; no murmurs, clicks, rubs, or gallops. Abdomen:  Normal bowel sounds.  No bruits.  Soft, non-tender and non-distended without masses, hepatosplenomegaly or hernias noted.  No guarding or rebound tenderness.  Negative Carnett sign.  Rectal:  Deferred.  Pulses:  Normal pulses noted. Extremities:  No clubbing or edema.  No cyanosis. Neurologic:  Alert and oriented x3;  grossly normal neurologically. Skin:  Intact without significant lesions or rashes.  No jaundice. Lymph Nodes:  No significant cervical adenopathy. Psych:  Alert and cooperative. Normal mood and affect.  Imaging Studies: No results found.  Assessment and Plan:   Haley Wyatt is a 47 y.o. y/o female ***    Midge Minium, MD. Clementeen Graham    Note: This dictation was prepared with Dragon dictation along with smaller phrase technology. Any transcriptional errors that result from this process are unintentional.

## 2023-10-06 ENCOUNTER — Other Ambulatory Visit: Payer: Self-pay | Admitting: Family

## 2023-10-06 DIAGNOSIS — F900 Attention-deficit hyperactivity disorder, predominantly inattentive type: Secondary | ICD-10-CM

## 2023-10-06 MED ORDER — LISDEXAMFETAMINE DIMESYLATE 30 MG PO CAPS
30.0000 mg | ORAL_CAPSULE | Freq: Every day | ORAL | 0 refills | Status: DC
Start: 2023-10-06 — End: 2024-01-01

## 2023-10-06 NOTE — Telephone Encounter (Signed)
sent RX - pt needs appt before next refill

## 2023-11-04 ENCOUNTER — Other Ambulatory Visit: Payer: Self-pay | Admitting: Family

## 2023-11-04 DIAGNOSIS — E1169 Type 2 diabetes mellitus with other specified complication: Secondary | ICD-10-CM

## 2023-11-04 DIAGNOSIS — F418 Other specified anxiety disorders: Secondary | ICD-10-CM

## 2023-11-06 NOTE — Telephone Encounter (Signed)
 Copied from CRM [id number]. Topic: Clinical - Medical Advice >> Nov 03, 2023  3:47 PM Evie B wrote: Reason for CRM:Pt called follow up on colon screening. Request to speak with provider   I returned pts call. Pt states insurance came to her home and did a cologuard screening but could not discuss results with her. I gave pt our fax number to fax cologuard results over from insurance.  Pt verbalized understanding.

## 2023-11-13 ENCOUNTER — Other Ambulatory Visit (INDEPENDENT_AMBULATORY_CARE_PROVIDER_SITE_OTHER): Payer: 59

## 2023-11-13 ENCOUNTER — Encounter: Payer: Self-pay | Admitting: Physician Assistant

## 2023-11-13 ENCOUNTER — Ambulatory Visit: Payer: 59 | Admitting: Physician Assistant

## 2023-11-13 VITALS — BP 122/70 | HR 70 | Ht 70.0 in | Wt 248.0 lb

## 2023-11-13 DIAGNOSIS — K219 Gastro-esophageal reflux disease without esophagitis: Secondary | ICD-10-CM

## 2023-11-13 DIAGNOSIS — K625 Hemorrhage of anus and rectum: Secondary | ICD-10-CM

## 2023-11-13 DIAGNOSIS — K5730 Diverticulosis of large intestine without perforation or abscess without bleeding: Secondary | ICD-10-CM | POA: Diagnosis not present

## 2023-11-13 DIAGNOSIS — Z903 Acquired absence of stomach [part of]: Secondary | ICD-10-CM

## 2023-11-13 DIAGNOSIS — R160 Hepatomegaly, not elsewhere classified: Secondary | ICD-10-CM | POA: Diagnosis not present

## 2023-11-13 DIAGNOSIS — K760 Fatty (change of) liver, not elsewhere classified: Secondary | ICD-10-CM | POA: Diagnosis not present

## 2023-11-13 LAB — HEPATIC FUNCTION PANEL
ALT: 10 U/L (ref 0–35)
AST: 12 U/L (ref 0–37)
Albumin: 4.2 g/dL (ref 3.5–5.2)
Alkaline Phosphatase: 61 U/L (ref 39–117)
Bilirubin, Direct: 0 mg/dL (ref 0.0–0.3)
Total Bilirubin: 0.3 mg/dL (ref 0.2–1.2)
Total Protein: 8 g/dL (ref 6.0–8.3)

## 2023-11-13 LAB — IBC + FERRITIN
Ferritin: 76.3 ng/mL (ref 10.0–291.0)
Iron: 62 ug/dL (ref 42–145)
Saturation Ratios: 20.2 % (ref 20.0–50.0)
TIBC: 306.6 ug/dL (ref 250.0–450.0)
Transferrin: 219 mg/dL (ref 212.0–360.0)

## 2023-11-13 LAB — CBC WITH DIFFERENTIAL/PLATELET
Basophils Absolute: 0.1 10*3/uL (ref 0.0–0.1)
Basophils Relative: 0.8 % (ref 0.0–3.0)
Eosinophils Absolute: 0.1 10*3/uL (ref 0.0–0.7)
Eosinophils Relative: 0.8 % (ref 0.0–5.0)
HCT: 42.6 % (ref 36.0–46.0)
Hemoglobin: 13.6 g/dL (ref 12.0–15.0)
Lymphocytes Relative: 47.9 % — ABNORMAL HIGH (ref 12.0–46.0)
Lymphs Abs: 3.6 10*3/uL (ref 0.7–4.0)
MCHC: 31.9 g/dL (ref 30.0–36.0)
MCV: 85.1 fL (ref 78.0–100.0)
Monocytes Absolute: 0.5 10*3/uL (ref 0.1–1.0)
Monocytes Relative: 6.5 % (ref 3.0–12.0)
Neutro Abs: 3.3 10*3/uL (ref 1.4–7.7)
Neutrophils Relative %: 44 % (ref 43.0–77.0)
Platelets: 280 10*3/uL (ref 150.0–400.0)
RBC: 5.01 Mil/uL (ref 3.87–5.11)
RDW: 14.2 % (ref 11.5–15.5)
WBC: 7.5 10*3/uL (ref 4.0–10.5)

## 2023-11-13 LAB — BASIC METABOLIC PANEL
BUN: 13 mg/dL (ref 6–23)
CO2: 27 meq/L (ref 19–32)
Calcium: 9.4 mg/dL (ref 8.4–10.5)
Chloride: 101 meq/L (ref 96–112)
Creatinine, Ser: 0.67 mg/dL (ref 0.40–1.20)
GFR: 102.56 mL/min (ref >60.00)
Glucose, Bld: 75 mg/dL (ref 70–99)
Potassium: 4 meq/L (ref 3.5–5.1)
Sodium: 137 meq/L (ref 135–145)

## 2023-11-13 MED ORDER — SUTAB 1479-225-188 MG PO TABS: 24.0000 | ORAL_TABLET | Freq: Once | ORAL | 0 refills | Status: AC

## 2023-11-13 NOTE — Patient Instructions (Addendum)
Your provider has requested that you go to the basement level for lab work before leaving today. Press "B" on the elevator. The lab is located at the first door on the left as you exit the elevator.  You have been scheduled for an endoscopy and colonoscopy. Please follow the written instructions given to you at your visit today.  Please pick up your prep supplies at the pharmacy within the next 1-3 days.  If you use inhalers (even only as needed), please bring them with you on the day of your procedure.  DO NOT TAKE 7 DAYS PRIOR TO TEST- Trulicity (dulaglutide) Ozempic, Wegovy (semaglutide) Mounjaro (tirzepatide) Bydureon Bcise (exanatide extended release)  DO NOT TAKE 1 DAY PRIOR TO YOUR TEST Rybelsus (semaglutide) Adlyxin (lixisenatide) Victoza (liraglutide) Byetta (exanatide) ___________________________________________________________________________ Bonita Quin will receive your bowel preparation through [facility], which ensures the lowest copay and home delivery, with outreach via text or call from an [phone] number. Please respond promptly to avoid rescheduling. If you are interested in alternative options or have any questions please contact them at [phone]  Your Provider Has Sent Your Bowel Prep Regimen To [facility] What to expect. [facility] will contact you to verify your information and collect your copay, if applicable. Enjoy the comfort of your home while we deliver your prescription to you, free of any shipping charges. Fast, FREE delivery or shipping. [facility] accepts all major insurance benefits and applies discounts & coupons  Have additional questions? [facility]'s patient care team is always here to help.  Chat: [email or web address] Call: [phone] Email: [email or web address] NCPDP: [id number] How will we contact you? Welcome Phone call  a Welcome text and a Checkout link in a text Texts you receive from [phone] Are Not Spam.   *To set up delivery,  you must complete the checkout process via link or speak to one of our patient care representatives. If we are unable to reach you, your prescription may be delayed.   Diverticulosis Diverticulosis is a condition that develops when small pouches (diverticula) form in the wall of the large intestine (colon). The colon is where water is absorbed and stool (feces) is formed. The pouches form when the inside layer of the colon pushes through weak spots in the outer layers of the colon. You may have a few pouches or many of them. The pouches usually do not cause problems unless they become inflamed or infected. When this happens, the condition is called diverticulitis- this is left lower quadrant pain, diarrhea, fever, chills, nausea or vomiting.  If this occurs please call the office or go to the hospital. Sometimes these patches without inflammation can also have painless bleeding associated with them, if this happens please call the office or go to the hospital. Preventing constipation and increasing fiber can help reduce diverticula and prevent complications. Even if you feel you have a high-fiber diet, suggest getting on Benefiber or Cirtracel 2 times daily.  Please do sitz baths- these can be found at the pharmacy. It is a Chief Operating Officer that is put in your toliet.  Please increase fiber or add benefiber, increase water and increase acitivity.  Will send in hydrocoritsone suppository, cheapest with [employer] from [employer], [employer], [employer] or [employer] if your insurance does not pay for it. If the hemorrhoid suppository sent in is too expensive you can do this over the counter trick.  Apply a pea size amount of generic prescription Anusol HC cream that has been sent into your pharmacy to the tip of an over the  counter PrepH suppository and insert rectally once every night for at least 7 nights.  If this does not improve there are procedures that can be done.  Avoid spicy and acidic foods Avoid  fatty foods Limit your intake of coffee, tea, alcohol, and carbonated drinks Work to maintain a healthy weight Keep the head of the bed elevated at least 3 inches with blocks or a wedge pillow if you are having any nighttime symptoms Stay upright for 2 hours after eating  About Hemorrhoids  Hemorrhoids are swollen veins in the lower rectum and anus.  Also called piles, hemorrhoids are a common problem.  Hemorrhoids may be internal (inside the rectum) or external (around the anus).  Internal Hemorrhoids  Internal hemorrhoids are often painless, but they rarely cause bleeding.  The internal veins may stretch and fall down (prolapse) through the anus to the outside of the body.  The veins may then become irritated and painful.  External Hemorrhoids  External hemorrhoids can be easily seen or felt around the anal opening.  They are under the skin around the anus.  When the swollen veins are scratched or broken by straining, rubbing or wiping they sometimes bleed.  How Hemorrhoids Occur  Veins in the rectum and around the anus tend to swell under pressure.  Hemorrhoids can result from increased pressure in the veins of your anus or rectum.  Some sources of pressure are:  Straining to have a bowel movement because of constipation Waiting too long to have a bowel movement Coughing and sneezing often Sitting for extended periods of time, including on the toilet Diarrhea Obesity Trauma or injury to the anus Some liver diseases Stress Family history of hemorrhoids Pregnancy  Pregnant women should try to avoid becoming constipated, because they are more likely to have hemorrhoids during pregnancy.  In the last trimester of pregnancy, the enlarged uterus may press on blood vessels and causes hemorrhoids.  In addition, the strain of childbirth sometimes causes hemorrhoids after the birth.  Symptoms of Hemorrhoids  Some symptoms of hemorrhoids include: Swelling and/or a tender lump around the  anus Itching, mild burning and bleeding around the anus Painful bowel movements with or without constipation Bright red blood covering the stool, on toilet paper or in the toilet bowel.   Symptoms usually go away within a few days.  Always talk to your doctor about any bleeding to make sure it is not from some other causes.  Diagnosing and Treating Hemorrhoids  Diagnosis is made by an examination by your healthcare provider.  Special test can be performed by your doctor.  Most cases of hemorrhoids can be treated with: High-fiber diet: Eat more high-fiber foods, which help prevent constipation.  Ask for more detailed fiber information on types and sources of fiber from your healthcare provider. Fluids: Drink plenty of water.  This helps soften bowel movements so they are easier to pass. Sitz baths and cold packs: Sitting in lukewarm water two or three times a day for 15 minutes cleases the anal area and may relieve discomfort.  If the water is too hot, swelling around the anus will get worse.  Placing a cloth-covered ice pack on the anus for ten minutes four times a day can also help reduce selling.  Gently pushing a prolapsed hemorrhoid back inside after the bath or ice pack can be helpful. Medications: For mild discomfort, your healthcare provider may suggest over-the-counter pain medication or prescribe a cream or ointment for topical use.  The cream may  contain witch hazel, zinc oxide or petroleum jelly.  Medicated suppositories are also a treatment option.  Always consult your doctor before applying medications or creams. Procedures and surgeries: There are also a number of procedures and surgeries to shrink or remove hemorrhoids in more serious cases.  Talk to your physician about these options.  You can often prevent hemorrhoids or keep them from becoming worse by maintaining a healthy lifestyle.  Eat a fiber-rich diet of fruits, vegetables and whole grains.  Also, drink plenty of water and  exercise regularly.   2007, [employer] Doc.30  Metabolic dysfunction associated seatohepatitis  Now the leading cause of liver failure in the [place].  It is normally from such risk factors as obesity, diabetes, insulin resistance, high cholesterol, or metabolic syndrome.  The only definitive therapy is weight loss and exercise.   Suggest walking 20-30 mins daily.  Decreasing carbohydrates, increasing veggies.    Fatty Liver Fatty liver is the accumulation of fat in liver cells. It is also called hepatosteatosis or steatohepatitis. It is normal for your liver to contain some fat. If fat is more than 5 to 10% of your liver's weight, you have fatty liver.  There are often no symptoms (problems) for years while damage is still occurring. People often learn about their fatty liver when they have medical tests for other reasons. Fat can damage your liver for years or even decades without causing problems. When it becomes severe, it can cause fatigue, weight loss, weakness, and confusion. This makes you more likely to develop more serious liver problems. The liver is the largest organ in the body. It does a lot of work and often gives no warning signs when it is sick until late in a disease. The liver has many important jobs including: Breaking down foods. Storing vitamins, iron, and other minerals. Making proteins. Making bile for food digestion. Breaking down many products including medications, alcohol and some poisons.  PROGNOSIS  Fatty liver may cause no damage or it can lead to an inflammation of the liver. This is, called steatohepatitis.  Over time the liver may become scarred and hardened. This condition is called cirrhosis. Cirrhosis is serious and may lead to liver failure or cancer. NASH is one of the leading causes of cirrhosis. About 10-20% of Americans have fatty liver and a smaller 2-5% has NASH.  TREATMENT  Weight loss, fat restriction, and exercise in  overweight patients produces inconsistent results but is worth trying. Good control of diabetes may reduce fatty liver. Eat a balanced, healthy diet. Increase your physical activity. There are no medical or surgical treatments for a fatty liver or NASH, but improving your diet and increasing your exercise may help prevent or reverse some of the damage.

## 2023-11-13 NOTE — Progress Notes (Addendum)
11/13/2023 Haley Wyatt [id number] 06/06/1976  Referring provider: Dulce Sellar, NP Primary GI doctor: Dr. Doy Hutching  ASSESSMENT AND PLAN:   Rectal bleeding, positive FOBT/FIT test with her insurance in Nov likely due to hemorrhoids, has never had GI evaluation History of painful hemorrhoids/possible fissure, on exam likely internal hemorrhoid with hypertrophied anal papule, likely worsening with GLP1 which was stopped - plan for colonoscopy at [facility] with EGD due to GERD ( see below) - given information about hemorrhoids, sitz bath, etc. Consider banding after colonoscopy - We have discussed the risks of bleeding, infection, perforation, medication reactions, and remote risk of death associated with colonoscopy. All questions were answered and the patient acknowledges these risk and wishes to proceed.  Gastroesophageal Reflux Disease (GERD) History of gastric sleeve surgery and occasional heartburn, getting worse, no dysphagia.  -Schedule EGD along with colonoscopy to evaluate for possible complications related to gastric sleeve surgery and assess severity of GERD.  Hepatomegaly with Mild Steatosis Noted on previous imaging. No history of alcohol or drug abuse ( remote history of IV drug use)  Significant weight loss achieved with gastric sleeve surgery. No signs of portal HTN - will calculate FIB4 from current labs, if elevated will plan on scheduling ultrasound with elastaography -Order labs to evaluate liver function and check for hepatitis screening - continue weight loss, check LFT/CBC every 6 months - will get hepatocellular work up  Diverticulosis History of diverticulosis noted on previous imaging. No current symptoms of diverticulitis. -Continue current management with high fiber diet and adequate hydration.  Constipation History of severe constipation episode in July 2024. Currently managed with Metamucil gummies and increased water intake. -add on  miralax/fiber -Advise patient to stop GLP-1 agonists 7 days prior to scheduled procedures due to risk of slowed gastric emptying. Not on one currently but may continue   I have reviewed the clinic note as outlined by Quentin Mulling, PA and agree with the assessment, plan and medical decision making. [name] presents with concerns regarding rectal bleeding likely related to hemorrhoids.  She did have a positive FOBT/FIT test and agree with further evaluation by colonoscopy.  She also has a pertinent surgical history of gastric sleeve and is endorsing occasional heartburn.  Appropriate to perform EGD at the time of colonoscopy.  For further evaluation of hepatomegaly will follow labs and calculate FIB4.  Agree with the addition of MiraLAX and fiber for regulation of bowel movements in the setting of constipation  Haley Beach, MD    Patient Care Team: Dulce Sellar, NP as PCP - General (Family Medicine)  HISTORY OF PRESENT ILLNESS: 48 y.o. female with a past medical history of HTN, type 2 DM, drug induced constipation, s/p gastric sleeve gastrectomy, menorrhagia, and others listed below presents for evaluation of hemorrhoids.   Discussed the use of AI scribe software for clinical note transcription with the patient, who gave verbal consent to proceed.  History of Present Illness   The patient, with a past medical history of gastric sleeve surgery and weight loss, presents with concerns about an abnormal stool test conducted in November. The patient reports experiencing rectal bleeding, which she attributes to hemorrhoids. The patient describes the hemorrhoids as extremely painful, causing discomfort during bowel movements and sitting, and making it difficult to maintain cleanliness. The patient also reports a fear of hard bowel movements due to a previous episode of severe constipation in July 2024 like caused by GLP1 which she is no longer on.  The patient also expresses  concerns about  liver enlargement and fatty liver, as indicated in a previous CT scan. 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 The patient denies any family history of liver issues but mentions a family history of diabetes.  The patient also mentions a family history of diverticulitis and colon cancer. The patient's grandmother had colon cancer, and her mother and uncle have diverticulitis. The patient has not had a colonoscopy or endoscopy before.      She  reports that she quit smoking about 17 years ago. Her smoking use included cigarettes. She has never used smokeless tobacco. She reports that she does not currently use alcohol. She reports that she does not use drugs.  RELEVANT GI HISTORY, LABS, IMAGING: 03/08/2023 CT AB with contrast IMPRESSION: 1. Postsurgical changes compatible with recent abdominoplasty. 2. Diffuse edema throughout the abdominal wall with subcutaneous stranding and scattered deep subcutaneous air pockets. Findings could be simply due to the recent surgery or could indicate infectious complication such as cellulitis. 3. 8.5 x 2.1 x 1.8 cm hematoma in the midline abdominal wall infolding between the rectus abdominis muscles. 4. Complex fluid collections in the lateral lower chest wall bilaterally, 8 x 3.3 x 7.7 cm on the right and 4.6 x 3.2 x 4.1 cm on the left. These could be hematomas, seromas, or abscesses. 5. Constipation and diverticulosis. 6. Hepatomegaly with mild steatosis. 7. Hiatal hernia with evidence of prior hiatal hernia repair. 8. Disproportionate facet hypertrophy at L4-5 with grade 1 L4-5 anterolisthesis.  CBC    Component Value Date/Time   WBC 8.0 01/11/2023 0947   RBC 5.01 01/11/2023 0947   HGB 13.6 01/11/2023 0947   HGB 13.6 06/02/2020 1520   HCT 42.2 01/11/2023 0947   HCT 42.4 06/02/2020 1520   PLT 221.0 01/11/2023 0947   MCV 84.2 01/11/2023 0947   MCV 84  06/02/2020 1520   MCH 27.0 06/02/2020 1520   MCH 26.8 (A) 12/11/2019 1030   MCH 27.3 12/20/2017 1830   MCHC 32.3 01/11/2023 0947   RDW 14.8 01/11/2023 0947   RDW 13.6 06/02/2020 1520   LYMPHSABS 2.9 01/11/2023 0947   LYMPHSABS 3.5 (H) 06/02/2020 1520   MONOABS 0.8 01/11/2023 0947   EOSABS 0.1 01/11/2023 0947   EOSABS 0.1 06/02/2020 1520   BASOSABS 0.0 01/11/2023 0947   BASOSABS 0.0 06/02/2020 1520   Recent Labs    01/11/23 0947  HGB 13.6    CMP     Component Value Date/Time   NA 137 05/02/2022 1022   NA 141 08/18/2020 1106   K 4.3 05/02/2022 1022   CL 103 05/02/2022 1022   CO2 29 05/02/2022 1022   GLUCOSE 91 05/02/2022 1022   BUN 12 05/02/2022 1022   BUN 11 08/18/2020 1106   CREATININE 0.86 05/02/2022 1022   CREATININE 0.84 03/06/2013 2126   CALCIUM 9.4 05/02/2022 1022   PROT 7.5 05/02/2022 1022   PROT 7.1 08/18/2020 1106   ALBUMIN 4.0 05/02/2022 1022   ALBUMIN 4.0 08/18/2020 1106   AST 14 05/02/2022 1022   ALT 13 05/02/2022 1022   ALKPHOS 63 05/02/2022 1022   BILITOT 0.4 05/02/2022 1022   BILITOT 0.4 08/18/2020 1106   GFRNONAA 81 08/18/2020 1106   GFRAA 94 08/18/2020 1106      Latest Ref Rng & Units 05/02/2022   10:22 AM 08/18/2020   11:06 AM 12/11/2019   10:34 AM  Hepatic Function  Total Protein 6.0 -  8.3 g/dL 7.5  7.1  7.1   Albumin 3.5 - 5.2 g/dL 4.0  4.0  4.1   AST 0 - 37 U/L 14  14  15    ALT 0 - 35 U/L 13  12  15    Alk Phosphatase 39 - 117 U/L 63  77  75   Total Bilirubin 0.2 - 1.2 mg/dL 0.4  0.4  0.2       Current Medications:   Current Outpatient Medications (Endocrine & Metabolic):    metFORMIN (GLUCOPHAGE) 1000 MG tablet, TAKE 1 TABLET BY MOUTH TWICE DAILY WITH MEALS   MOUNJARO 7.5 MG/0.5ML Pen, INJECT 1/2 (ONE-HALF) ML INTO THE SKIN  ONCE A WEEK  Current Outpatient Medications (Cardiovascular):    losartan (COZAAR) 25 MG tablet, Take 1 tablet (25 mg total) by mouth 2 (two) times daily.     Current Outpatient Medications (Other):     busPIRone (BUSPAR) 5 MG tablet, TAKE 1 TO 2 TABLETS BY MOUTH 3 TIMES DAILY AS NEEDED FOR 1 TO 2 WEEKS AT LEAST, THEN DECREASE TO TWICE A DAY OR DAILY USE   ketoconazole (NIZORAL) 2 % cream, Apply 1 application topically daily.   Multiple Vitamins-Minerals (MULTI-VITE) LIQD, Take 30 mLs by mouth daily.   hydrocortisone (ANUSOL-HC) 25 MG suppository, INSERT 1 SUPPOSITORY RECTALLY TWICE DAILY (Patient not taking: Reported on 11/13/2023)   Hydrocortisone, Perianal, (PREPARATION H) 1 % CREA, Apply 1 Application topically as directed. After every BM. (Patient not taking: Reported on 11/13/2023)   lisdexamfetamine (VYVANSE) 30 MG capsule, Take 1 capsule (30 mg total) by mouth daily before breakfast.   lisdexamfetamine (VYVANSE) 30 MG capsule, Take 1 capsule (30 mg total) by mouth daily before breakfast.   lisdexamfetamine (VYVANSE) 30 MG capsule, Take 1 capsule (30 mg total) by mouth daily before breakfast.   metroNIDAZOLE (METROGEL) 0.75 % vaginal gel, Place 1 Applicatorful vaginally at bedtime.  Medical History:  Past Medical History:  Diagnosis Date   ADD (attention deficit disorder)    ADHD    Allergy    Back pain    "muscle strain from exercising"   Back pain    Borderline diabetes    states "levels have been about 102"   Chest pain    Chronic headaches    Constipation    Corneal ulceration 06/23/2021   Dysmenorrhea 01/28/2015   Fibroid tumor    inside uterus   GERD 06/11/2010   Qualifier: Diagnosis of  By: Benjamin Stain MD, Maisie Fus     GERD (gastroesophageal reflux disease)    PT STATES "DOES NOT HAVE IT NOW"   H/O hiatal hernia    Heel pain, bilateral 06/23/2021   Hypertension    NO MEDS.. BP CONTROLLED   Hypothyroidism    HYPOTHYROIDISM 04/16/2009   Qualifier: Diagnosis of  By: Benjamin Stain MD, Thomas     Joint pain    LEG EDEMA, 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   Qualifier: Diagnosis of  By: Benjamin Stain MD, Thomas     OBESITY 04/16/2009   Other fatigue 08/18/2020   Other specified  hypothyroidism 08/18/2020   Sedimentation rate elevation 06/23/2021   SOB (shortness of breath) on exertion    Swelling of both lower extremities    Urge incontinence 05/02/2022   Allergies: No Known Allergies   Surgical History:  She  has a past surgical history that includes Shoulder surgery; Carpal tunnel release (Right); Tubal ligation; Laparoscopic gastric sleeve resection (N/A, 02/11/2014); Hiatal hernia repair (N/A, 02/11/2014); Upper gi endoscopy (02/11/2014); Dilatation & currettage/hysteroscopy with hydrothermal ablation (  N/A, 04/16/2014); Supracervical abdominal hysterectomy (Bilateral, 01/28/2015); Bilateral salpingectomy (Bilateral, 01/28/2015); Abdominal hysterectomy; and Abdominoplasty/panniculectomy with liposuction (02/15/2023). Family History:  Her family history includes Alcoholism in her mother; Colon cancer in her maternal grandmother; Depression in her father; Diabetes in her father and paternal grandmother; Diverticulitis in her mother; Drug abuse in her sister; Hyperlipidemia in her father; Hypertension in her father; Mental illness in her sister; Schizophrenia in her father.  REVIEW OF SYSTEMS  : All other systems reviewed and negative except where noted in the History of Present Illness.  PHYSICAL EXAM: BP 122/70   Pulse 70   Ht 5\' 10"  (1.778 m)   Wt 248 lb (112.5 kg)   LMP 04/28/2014   BMI 35.58 kg/m  General Appearance: Well nourished, in no apparent distress. Head:   Normocephalic and atraumatic. Eyes:  sclerae anicteric,conjunctive pink  Respiratory: Respiratory effort normal, BS equal bilaterally without rales, rhonchi, wheezing. Cardio: RRR with no MRGs. Peripheral pulses intact.  Abdomen: Soft,  Flat ,active bowel sounds. mild tenderness in the epigastrium, striae, well healed umbilical/surgical scars Without guarding and Without rebound. No masses. Rectal: Normal external rectal exam, normal rectal tone, appreciated internal hemorrhoids, tender, no  masses, , brown stool, hemoccult Positive Musculoskeletal: Full ROM, Normal gait. Without edema. Skin:  Dry and intact without significant lesions or rashes Neuro: Alert and  oriented x4;  No focal deficits. Psych:  Cooperative. Normal mood and affect.    Doree Albee, PA-C 3:45 PM

## 2023-11-16 LAB — HEPATITIS C ANTIBODY: Hepatitis C Ab: NONREACTIVE

## 2023-11-16 LAB — ANTI-SMOOTH MUSCLE ANTIBODY, IGG: Actin (Smooth Muscle) Antibody (IGG): 40 U — ABNORMAL HIGH (ref <20)

## 2023-11-16 LAB — ANTI-NUCLEAR AB-TITER (ANA TITER): ANA Titer 1: 1:40 {titer} — ABNORMAL HIGH

## 2023-11-16 LAB — ANA: Anti Nuclear Antibody (ANA): POSITIVE — AB

## 2023-11-16 LAB — HEPATITIS B SURFACE ANTIBODY,QUALITATIVE: Hep B S Ab: REACTIVE — AB

## 2023-11-16 LAB — HEPATITIS B SURFACE ANTIGEN: Hepatitis B Surface Ag: NONREACTIVE

## 2023-11-16 LAB — IGG: IgG (Immunoglobin G), Serum: 1587 mg/dL (ref 600–1640)

## 2023-11-16 LAB — MITOCHONDRIAL ANTIBODIES: Mitochondrial M2 Ab, IgG: <=20 U (ref <=20.0)

## 2023-11-16 LAB — HEPATITIS A ANTIBODY, TOTAL: Hepatitis A AB,Total: REACTIVE — AB

## 2023-12-05 ENCOUNTER — Encounter: Payer: Self-pay | Admitting: Pediatrics

## 2023-12-11 NOTE — Progress Notes (Unsigned)
[facility] History and Physical   Primary Care Physician:  Dulce Sellar, NP   Reason for Procedure:  GERD, heartburn, history of gastric sleeve     Rectal bleeding, constipation, positive FOBT/FIT  Plan:    Diagnostic upper endoscopy and colonoscopy  HPI: Haley Wyatt is a 48 y.o. female undergoing diagnostic upper endoscopy for evaluation of GERD, heartburn in the setting of gastric sleeve.  No dysphagia or odynophagia.  Also has a history of rectal bleeding in the setting of a positive FOBT/FIT tests.  It is possible this is related to hemorrhoids but reviewed it would be prudent to proceed with colonoscopy.  Endorses symptoms of constipation.  States that her grandmother had colon cancer.  The patient has never had a colonoscopy   Past Medical History:  Diagnosis Date   ADD (attention deficit disorder)    ADHD    Allergy    Back pain    "muscle strain from exercising"   Back pain    Borderline diabetes    states "levels have been about 102"   Chest pain    Chronic headaches    Constipation    Corneal ulceration 06/23/2021   Dysmenorrhea 01/28/2015   Fibroid tumor    inside uterus   GERD 06/11/2010   Qualifier: Diagnosis of  By: Benjamin Stain MD, Maisie Fus     GERD (gastroesophageal reflux disease)    PT STATES "DOES NOT HAVE IT NOW"   H/O hiatal hernia    Heel pain, bilateral 06/23/2021   Hypertension    NO MEDS.. BP CONTROLLED   Hypothyroidism    HYPOTHYROIDISM 04/16/2009   Qualifier: Diagnosis of  By: Benjamin Stain MD, Thomas     Joint pain    LEG EDEMA, 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   Qualifier: Diagnosis of  By: Benjamin Stain MD, Thomas     OBESITY 04/16/2009   Other fatigue 08/18/2020   Other specified hypothyroidism 08/18/2020   Sedimentation rate elevation 06/23/2021   SOB (shortness of breath) on exertion    Swelling of both lower extremities    Urge incontinence 05/02/2022    Past Surgical History:  Procedure Laterality Date   ABDOMINAL  HYSTERECTOMY     ABDOMINOPLASTY/PANNICULECTOMY WITH LIPOSUCTION  02/15/2023   surgery in [place]   BILATERAL SALPINGECTOMY Bilateral 01/28/2015   Procedure: BILATERAL SALPINGECTOMY;  Surgeon: Hal Morales, MD;  Location: [facility];  Service: Gynecology;  Laterality: Bilateral;   CARPAL TUNNEL RELEASE Right    DILITATION & CURRETTAGE/HYSTROSCOPY WITH HYDROTHERMAL ABLATION N/A 04/16/2014   Procedure: DILATATION & CURETTAGE/HYSTEROSCOPY WITH HYDROTHERMAL ABLATION;  Surgeon: Kathreen Cosier, MD;  Location: [facility];  Service: Gynecology;  Laterality: N/A;   HIATAL HERNIA REPAIR N/A 02/11/2014   Procedure: LAPAROSCOPIC REPAIR OF HIATAL HERNIA;  Surgeon: Valarie Merino, MD;  Location: [facility];  Service: General;  Laterality: N/A;   LAPAROSCOPIC GASTRIC SLEEVE RESECTION N/A 02/11/2014   Procedure: LAPAROSCOPIC GASTRIC SLEEVE RESECTION;  Surgeon: Valarie Merino, MD;  Location: [facility];  Service: General;  Laterality: N/A;   SHOULDER SURGERY     left shoulder loose body removal   SUPRACERVICAL ABDOMINAL HYSTERECTOMY Bilateral 01/28/2015   Procedure: TOTAL SUPER CERVICAL ABDOMINAL HYSTERECTOMY BILATERAL SALPINGECTOMY;  Surgeon: Hal Morales, MD;  Location: [facility];  Service: Gynecology;  Laterality: Bilateral;   TUBAL LIGATION     UPPER GI ENDOSCOPY  02/11/2014   Procedure: UPPER GI ENDOSCOPY;  Surgeon: Valarie Merino, MD;  Location: [facility];  Service: General;;    Prior to Admission medications  Medication Sig Start Date End Date Taking? Authorizing Provider  busPIRone (BUSPAR) 5 MG tablet TAKE 1 TO 2 TABLETS BY MOUTH 3 TIMES DAILY AS NEEDED FOR 1 TO 2 WEEKS AT LEAST, THEN DECREASE TO TWICE A DAY OR DAILY USE 11/06/23   Dulce Sellar, NP  hydrocortisone (ANUSOL-HC) 25 MG suppository INSERT 1 SUPPOSITORY RECTALLY TWICE DAILY Patient not taking: Reported on 11/13/2023 05/22/23   Dulce Sellar, NP  Hydrocortisone, Perianal, (PREPARATION H) 1 % CREA Apply 1 Application  topically as directed. After every BM. Patient not taking: Reported on 11/13/2023 05/29/23   Dulce Sellar, NP  ketoconazole (NIZORAL) 2 % cream Apply 1 application topically daily. 10/12/21   Janeece Agee, NP  lisdexamfetamine (VYVANSE) 30 MG capsule Take 1 capsule (30 mg total) by mouth daily before breakfast. 07/14/23 08/13/23  Dulce Sellar, NP  lisdexamfetamine (VYVANSE) 30 MG capsule Take 1 capsule (30 mg total) by mouth daily before breakfast. 08/13/23 09/12/23  Dulce Sellar, NP  lisdexamfetamine (VYVANSE) 30 MG capsule Take 1 capsule (30 mg total) by mouth daily before breakfast. 10/06/23 11/05/23  Dulce Sellar, NP  losartan (COZAAR) 25 MG tablet Take 1 tablet (25 mg total) by mouth 2 (two) times daily. 07/14/23   Dulce Sellar, NP  metFORMIN (GLUCOPHAGE) 1000 MG tablet TAKE 1 TABLET BY MOUTH TWICE DAILY WITH MEALS 11/06/23   Dulce Sellar, NP  metroNIDAZOLE (METROGEL) 0.75 % vaginal gel Place 1 Applicatorful vaginally at bedtime. 01/05/23   Chrzanowski, Jami B, NP  MOUNJARO 7.5 MG/0.5ML Pen INJECT 1/2 (ONE-HALF) ML INTO THE SKIN  ONCE A WEEK 06/23/23   Dulce Sellar, NP  Multiple Vitamins-Minerals (MULTI-VITE) LIQD Take 30 mLs by mouth daily.    [provider]    Current Outpatient Medications  Medication Sig Dispense Refill   busPIRone (BUSPAR) 5 MG tablet TAKE 1 TO 2 TABLETS BY MOUTH 3 TIMES DAILY AS NEEDED FOR 1 TO 2 WEEKS AT LEAST, THEN DECREASE TO TWICE A DAY OR DAILY USE 60 tablet 0   hydrocortisone (ANUSOL-HC) 25 MG suppository INSERT 1 SUPPOSITORY RECTALLY TWICE DAILY (Patient not taking: Reported on 11/13/2023) 12 suppository 1   Hydrocortisone, Perianal, (PREPARATION H) 1 % CREA Apply 1 Application topically as directed. After every BM. (Patient not taking: Reported on 11/13/2023) 30 g 1   ketoconazole (NIZORAL) 2 % cream Apply 1 application topically daily. 30 g 3   lisdexamfetamine (VYVANSE) 30 MG capsule Take 1 capsule (30 mg total) by  mouth daily before breakfast. 30 capsule 0   lisdexamfetamine (VYVANSE) 30 MG capsule Take 1 capsule (30 mg total) by mouth daily before breakfast. 30 capsule 0   lisdexamfetamine (VYVANSE) 30 MG capsule Take 1 capsule (30 mg total) by mouth daily before breakfast. 30 capsule 0   losartan (COZAAR) 25 MG tablet Take 1 tablet (25 mg total) by mouth 2 (two) times daily. 180 tablet 1   metFORMIN (GLUCOPHAGE) 1000 MG tablet TAKE 1 TABLET BY MOUTH TWICE DAILY WITH MEALS 60 tablet 0   metroNIDAZOLE (METROGEL) 0.75 % vaginal gel Place 1 Applicatorful vaginally at bedtime. 70 g 0   MOUNJARO 7.5 MG/0.5ML Pen INJECT 1/2 (ONE-HALF) ML INTO THE SKIN  ONCE A WEEK 12 mL 0   Multiple Vitamins-Minerals (MULTI-VITE) LIQD Take 30 mLs by mouth daily.     No current facility-administered medications for this visit.    Allergies as of 12/12/2023   (No Known Allergies)    Family History  Problem Relation Age of Onset   Diverticulitis Mother  Alcoholism Mother    Hyperlipidemia Father    Hypertension Father    Diabetes Father    Schizophrenia Father    Depression Father    Mental illness Sister    Drug abuse Sister    Colon cancer Maternal Grandmother    Diabetes Paternal Grandmother    Sudden death Neg Hx    Heart attack Neg Hx     Social History   Socioeconomic History   Marital status: Single    Spouse name: Not on file   Number of children: 4   Years of education: Not on file   Highest education level: Not on file  Occupational History   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    Employer: [employer]   Occupation: ceo  Tobacco Use   Smoking status: Former    Current packs/day: 0.00    Types: Cigarettes    Quit date: 02/05/2006    Years since quitting: 17.8   Smokeless tobacco: Never  Vaping Use   Vaping status: Never Used  Substance and Sexual Activity   Alcohol use: Not Currently    Comment: Occasionally   Drug use: No   Sexual activity: Not Currently    Partners: Male     Birth control/protection: Surgical    Comment: menarche 48yo, sexual debut 48yo  Other Topics Concern   Not on file  Social History Narrative   Marital status: single      Children: four      Employment:  Mental health professional      Tobacco: none       Alcohol: wine per year.      Drugs: none      Exercise: stopped exercise one month ago.   Social Drivers of Corporate investment banker Strain: Not on file  Food Insecurity: Not on file  Transportation Needs: Not on file  Physical Activity: Not on file  Stress: No Stress Concern Present (10/14/2020)   Received from [facility], [facility] Occupational [facility] - Occupational Stress Questionnaire    Feeling of Stress : Not at all  Social Connections: Unknown (03/08/2022)   Received from [facility], [facility]   Social Network    Social Network: Not on file  Intimate Partner Violence: Unknown (01/28/2022)   Received from [facility], [facility]   HITS    Physically Hurt: Not on file    Insult or Talk Down To: Not on file    Threaten Physical Harm: Not on file    Scream or Curse: Not on file    Review of Systems:  All other review of systems negative except as mentioned in the HPI.  Physical Exam: Vital signs LMP 04/28/2014   General:   Alert,  Well-developed, well-nourished, pleasant and cooperative in NAD Airway:  Mallampati  Lungs:  Clear throughout to auscultation.   Heart:  Regular rate and rhythm; no murmurs, clicks, rubs,  or gallops. Abdomen:  Soft, nontender and nondistended. Normal bowel sounds.   Neuro/Psych:  Normal mood and affect. A and O x 3  [name], MD [facility]

## 2023-12-12 ENCOUNTER — Ambulatory Visit (AMBULATORY_SURGERY_CENTER): Payer: 59 | Admitting: Pediatrics

## 2023-12-12 ENCOUNTER — Encounter: Payer: Self-pay | Admitting: Pediatrics

## 2023-12-12 VITALS — BP 122/78 | HR 75 | Temp 97.3°F | Resp 12 | Ht 70.0 in | Wt 248.0 lb

## 2023-12-12 DIAGNOSIS — K6289 Other specified diseases of anus and rectum: Secondary | ICD-10-CM | POA: Diagnosis not present

## 2023-12-12 DIAGNOSIS — K319 Disease of stomach and duodenum, unspecified: Secondary | ICD-10-CM

## 2023-12-12 DIAGNOSIS — K219 Gastro-esophageal reflux disease without esophagitis: Secondary | ICD-10-CM

## 2023-12-12 DIAGNOSIS — K449 Diaphragmatic hernia without obstruction or gangrene: Secondary | ICD-10-CM | POA: Diagnosis not present

## 2023-12-12 DIAGNOSIS — E669 Obesity, unspecified: Secondary | ICD-10-CM | POA: Diagnosis not present

## 2023-12-12 DIAGNOSIS — K649 Unspecified hemorrhoids: Secondary | ICD-10-CM | POA: Diagnosis not present

## 2023-12-12 DIAGNOSIS — K2950 Unspecified chronic gastritis without bleeding: Secondary | ICD-10-CM | POA: Diagnosis not present

## 2023-12-12 DIAGNOSIS — E119 Type 2 diabetes mellitus without complications: Secondary | ICD-10-CM | POA: Diagnosis not present

## 2023-12-12 DIAGNOSIS — K259 Gastric ulcer, unspecified as acute or chronic, without hemorrhage or perforation: Secondary | ICD-10-CM | POA: Diagnosis not present

## 2023-12-12 DIAGNOSIS — K625 Hemorrhage of anus and rectum: Secondary | ICD-10-CM

## 2023-12-12 DIAGNOSIS — R195 Other fecal abnormalities: Secondary | ICD-10-CM | POA: Diagnosis not present

## 2023-12-12 DIAGNOSIS — K2970 Gastritis, unspecified, without bleeding: Secondary | ICD-10-CM | POA: Diagnosis not present

## 2023-12-12 DIAGNOSIS — E039 Hypothyroidism, unspecified: Secondary | ICD-10-CM | POA: Diagnosis not present

## 2023-12-12 MED ORDER — SODIUM CHLORIDE 0.9 % IV SOLN: 500.0000 mL | Freq: Once | INTRAVENOUS | Status: DC

## 2023-12-12 NOTE — Progress Notes (Signed)
 Pt's states no medical or surgical changes since previsit or office visit.

## 2023-12-12 NOTE — Progress Notes (Signed)
 Called to room to assist during endoscopic procedure.  Patient ID and intended procedure confirmed with present staff. Received instructions for my participation in the procedure from the performing physician.

## 2023-12-12 NOTE — Patient Instructions (Addendum)
Handouts provided on gastritis, hiatal hernia and hemorrhoids.  Resume previous diet.  Continue present medications.  Await pathology results.  Patient can resume FIT testing annually versus colonoscopy in 10 years.   YOU HAD AN ENDOSCOPIC PROCEDURE TODAY AT THE [facility]:   Refer to the procedure report that was given to you for any specific questions about what was found during the examination.  If the procedure report does not answer your questions, please call your gastroenterologist to clarify.  If you requested that your care partner not be given the details of your procedure findings, then the procedure report has been included in a sealed envelope for you to review at your convenience later.  YOU SHOULD EXPECT: Some feelings of bloating in the abdomen. Passage of more gas than usual.  Walking can help get rid of the air that was put into your GI tract during the procedure and reduce the bloating. If you had a lower endoscopy (such as a colonoscopy or flexible sigmoidoscopy) you may notice spotting of blood in your stool or on the toilet paper. If you underwent a bowel prep for your procedure, you may not have a normal bowel movement for a few days.  Please Note:  You might notice some irritation and congestion in your nose or some drainage.  This is from the oxygen used during your procedure.  There is no need for concern and it should clear up in a day or so.  SYMPTOMS TO REPORT IMMEDIATELY:  Following lower endoscopy (colonoscopy or flexible sigmoidoscopy):  Excessive amounts of blood in the stool  Significant tenderness or worsening of abdominal pains  Swelling of the abdomen that is new, acute  Fever of 100F or higher  Following upper endoscopy (EGD)  Vomiting of blood or coffee ground material  New chest pain or pain under the shoulder blades  Painful or persistently difficult swallowing  New shortness of breath  Fever of 100F or higher  Black, tarry-looking  stools  For urgent or emergent issues, a gastroenterologist can be reached at any hour by calling [phone]. Do not use MyChart messaging for urgent concerns.    DIET:  We do recommend a small meal at first, but then you may proceed to your regular diet.  Drink plenty of fluids but you should avoid alcoholic beverages for 24 hours.  ACTIVITY:  You should plan to take it easy for the rest of today and you should NOT DRIVE or use heavy machinery until tomorrow (because of the sedation medicines used during the test).    FOLLOW UP: Our staff will call the number listed on your records the next business day following your procedure.  We will call around 7:15- 8:00 am to check on you and address any questions or concerns that you may have regarding the information given to you following your procedure. If we do not reach you, we will leave a message.     If any biopsies were taken you will be contacted by phone or by letter within the next 1-3 weeks.  Please call us at [phone] if you have not heard about the biopsies in 3 weeks.    SIGNATURES/CONFIDENTIALITY: You and/or your care partner have signed paperwork which will be entered into your electronic medical record.  These signatures attest to the fact that that the information above on your After Visit Summary has been reviewed and is understood.  Full responsibility of the confidentiality of this discharge information lies with you  and/or your care-partner.

## 2023-12-12 NOTE — Progress Notes (Signed)
 To pacu, VSS. Report to [email or web address]

## 2023-12-12 NOTE — Op Note (Signed)
[facility] Patient Name: Haley Wyatt Procedure Date: 12/12/2023 3:14 PM MRN: [id number] Endoscopist: [name] , MD, [id number] Age: 48 Referring MD:  Date of Birth: 1976/06/26 Gender: Female Account #: [id number] Procedure:                Upper GI endoscopy Indications:              Heartburn, Follow-up of gastro-esophageal reflux                            disease Medicines:                Monitored Anesthesia Care Procedure:                Pre-Anesthesia Assessment:                           - Prior to the procedure, a History and Physical                            was performed, and patient medications and                            allergies were reviewed. The patient's tolerance of                            previous anesthesia was also reviewed. The risks                            and benefits of the procedure and the sedation                            options and risks were discussed with the patient.                            All questions were answered, and informed consent                            was obtained. Prior Anticoagulants: The patient has                            taken no anticoagulant or antiplatelet agents. ASA                            Grade Assessment: II - A patient with mild systemic                            disease. After reviewing the risks and benefits,                            the patient was deemed in satisfactory condition to                            undergo the procedure.  After obtaining informed consent, the endoscope was                            passed under direct vision. Throughout the                            procedure, the patient's blood pressure, pulse, and                            oxygen saturations were monitored continuously. The                            GIF [id number] #[id number] was introduced through the                            mouth, and advanced to the second part of  duodenum.                            The upper GI endoscopy was accomplished without                            difficulty. The patient tolerated the procedure                            well. Scope In: Scope Out: Findings:                 The examined esophagus was normal.                           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 There was acute angulation stomach in the                            gastric body requiring maneuvering of the endoscope                            into the antrum.                           Scattered mild inflammation characterized by                            adherent blood, erosions and erythema was found in                            the cardia, in the gastric antrum and in the                            prepyloric region of the stomach. Biopsies were                            taken with a cold  forceps for Helicobacter pylori                            testing.                           A small hiatal hernia was present in the en face                            view. Retroflexion was unable to be performed due                            to the angulation of the stomach.                           The duodenal bulb and second portion of the                            duodenum were normal. Complications:            No immediate complications. Estimated blood loss:                            Minimal. Estimated Blood Loss:     Estimated blood loss was minimal. Impression:               - Normal esophagus.                           - A sleeve gastrectomy was found. There was acute                            angulation stomach in the gastric body requiring                            maneuvering of the endoscope into the antrum.                           - Gastritis. Biopsied.                           - Small hiatal hernia in en face view. Retroflexion                            was unable to be performed  due to the angulation of                            the stomach.                           - Normal duodenal bulb and second portion of the                            duodenum. Recommendation:           - Await pathology results.                           -  Perform a colonoscopy today.                           - The findings and recommendations were discussed                            with the patient's family. [name], MD 12/12/2023 3:57:47 PM This report has been signed electronically.

## 2023-12-12 NOTE — Op Note (Signed)
[facility] Patient Name: Haley Wyatt Procedure Date: 12/12/2023 3:14 PM MRN: [id number] Endoscopist: [name] , MD, [id number] Age: 48 Referring MD:  Date of Birth: 1975/10/29 Gender: Female Account #: [id number] Procedure:                Colonoscopy Indications:              This is the patient's first colonoscopy, Positive                            fecal immunochemical test, Rectal bleeding,                            Constipation, family history of colorectal cancer                            in a grandmother and cousin on the same side of the                            family Procedure:                Pre-Anesthesia Assessment:                           - Prior to the procedure, a History and Physical                            was performed, and patient medications and                            allergies were reviewed. The patient's tolerance of                            previous anesthesia was also reviewed. The risks                            and benefits of the procedure and the sedation                            options and risks were discussed with the patient.                            All questions were answered, and informed consent                            was obtained. Prior Anticoagulants: The patient has                            taken no anticoagulant or antiplatelet agents. ASA                            Grade Assessment: II - A patient with mild systemic                            disease. After reviewing the risks and benefits,  the patient was deemed in satisfactory condition to                            undergo the procedure.                           After obtaining informed consent, the colonoscope                            was passed under direct vision. Throughout the                            procedure, the patient's blood pressure, pulse, and                            oxygen saturations were  monitored continuously. The                            CF [id number] #[id number] was introduced through the anus                            and advanced to the cecum, identified by                            appendiceal orifice and ileocecal valve. The                            colonoscopy was performed without difficulty. The                            patient tolerated the procedure well. The quality                            of the bowel preparation was good. The terminal                            ileum, the ileocecal valve, the appendiceal orifice                            and the rectum were photographed. Scope In: 3:32:31 PM Scope Out: 3:46:44 PM Scope Withdrawal Time: 0 hours 10 minutes 40 seconds  Total Procedure Duration: 0 hours 14 minutes 13 seconds  Findings:                 The perianal and digital rectal examinations were                            normal. Pertinent negatives include normal                            sphincter tone and no palpable rectal lesions.                           The colon (entire examined portion) appeared normal.  The terminal ileum appeared normal.                           Hemorrhoids were found during retroflexion. Anal                            papillae were hypertrophied. Complications:            No immediate complications. Estimated Blood Loss:     Estimated blood loss: none. Impression:               - The entire examined colon is normal.                           - The examined portion of the ileum was normal.                           - Hemorrhoids. Anal papilla were hypertrophied.                            Hemorrhoids may explain recent positive FIT test.                           - No specimens collected. Recommendation:           - Discharge patient to home (ambulatory).                           - Patient can resume FIT testing annually versus                            colonoscopy in 10 years.                            - The findings and recommendations were discussed                            with the patient's family.                           - Return to referring physician.                           - Patient has a contact number available for                            emergencies. The signs and symptoms of potential                            delayed complications were discussed with the                            patient. Return to normal activities tomorrow.                            Written discharge instructions were provided to the  patient. [name], MD 12/12/2023 4:03:42 PM This report has been signed electronically.

## 2023-12-13 ENCOUNTER — Telehealth: Payer: Self-pay

## 2023-12-13 NOTE — Telephone Encounter (Signed)
Attempted to reach patient for post-procedure f/u call. No answer. Left message for her to please not hesitate to call if she has any questions/concerns regarding her care. 

## 2023-12-15 LAB — SURGICAL PATHOLOGY

## 2023-12-17 ENCOUNTER — Encounter: Payer: Self-pay | Admitting: Pediatrics

## 2023-12-18 ENCOUNTER — Other Ambulatory Visit: Payer: Self-pay | Admitting: Family

## 2023-12-20 ENCOUNTER — Ambulatory Visit: Payer: 59 | Admitting: Obstetrics and Gynecology

## 2023-12-20 ENCOUNTER — Ambulatory Visit: Payer: 59 | Admitting: Nurse Practitioner

## 2023-12-20 VITALS — BP 122/84 | Temp 98.7°F | Wt 250.0 lb

## 2023-12-20 DIAGNOSIS — N760 Acute vaginitis: Secondary | ICD-10-CM

## 2023-12-20 DIAGNOSIS — R3915 Urgency of urination: Secondary | ICD-10-CM

## 2023-12-20 DIAGNOSIS — N898 Other specified noninflammatory disorders of vagina: Secondary | ICD-10-CM | POA: Diagnosis not present

## 2023-12-20 LAB — URINALYSIS, COMPLETE W/RFL CULTURE
Bacteria, UA: NONE SEEN /[HPF]
Bilirubin Urine: NEGATIVE
Casts: NONE SEEN /[LPF]
Crystals: NONE SEEN /[HPF]
Glucose, UA: NEGATIVE
Hgb urine dipstick: NEGATIVE
Leukocyte Esterase: NEGATIVE
Nitrites, Initial: NEGATIVE
Protein, ur: NEGATIVE
RBC / HPF: NONE SEEN /[HPF] (ref 0–2)
Specific Gravity, Urine: 1.025 (ref 1.001–1.035)
WBC, UA: NONE SEEN /[HPF] (ref 0–5)
Yeast: NONE SEEN /[HPF]
pH: 5.5 (ref 5.0–8.0)

## 2023-12-20 LAB — WET PREP FOR TRICH, YEAST, CLUE

## 2023-12-20 LAB — NO CULTURE INDICATED

## 2023-12-20 MED ORDER — FLUCONAZOLE 150 MG PO TABS: 150.0000 mg | ORAL_TABLET | ORAL | 0 refills | Status: DC

## 2023-12-20 NOTE — Progress Notes (Signed)
   Acute Office Visit  Subjective:    Patient ID: Haley Wyatt, female    DOB: [birth year], 48 y.o.   MRN: [id number]   HPI 48 y.o. presents today for discharge and odor x 2 weeks. Also has felt a pressure with urination. Took course of antibiotics for tooth infection 3 weeks ago. Tried OTC monistat a couple of weeks ago with some relief.   Patient's last menstrual period was 04/28/2014.    Review of Systems  Constitutional: Negative.   Genitourinary:  Positive for frequency and vaginal discharge. Negative for dysuria, flank pain, hematuria, pelvic pain and urgency.       Vaginal odor       Objective:    Physical Exam Constitutional:      Appearance: Normal appearance.  Genitourinary:    General: Normal vulva.     Vagina: Normal.     BP 122/84   Temp 98.7 F (37.1 C)   Wt 250 lb (113.4 kg)   LMP 04/28/2014   SpO2 100%   BMI 35.87 kg/m  Wt Readings from Last 3 Encounters:  12/20/23 250 lb (113.4 kg)  12/12/23 248 lb (112.5 kg)  11/13/23 248 lb (112.5 kg)        Patient informed chaperone available to be present for breast and/or pelvic exam. Patient has requested no chaperone to be present. Patient has been advised what will be completed during breast and pelvic exam.   Wet prep negative for pathogens  UA negative  Assessment & Plan:   Problem List Items Addressed This Visit   None Visit Diagnoses       Acute vaginitis    -  Primary   Relevant Medications   fluconazole (DIFLUCAN) 150 MG tablet   Other Relevant Orders   WET PREP FOR TRICH, YEAST, CLUE     Urgency of urination       Relevant Orders   Urinalysis,Complete w/RFL Culture      Plan: Wet prep and UA negative. Diflucan provided due to recent antibiotic use and current symptoms.    Return if symptoms worsen or fail to improve.    Olivia Mackie DNP, 11:37 AM 12/20/2023

## 2023-12-27 ENCOUNTER — Other Ambulatory Visit: Payer: Self-pay | Admitting: Family

## 2023-12-27 DIAGNOSIS — F900 Attention-deficit hyperactivity disorder, predominantly inattentive type: Secondary | ICD-10-CM

## 2023-12-27 NOTE — Telephone Encounter (Signed)
 LOV 05/22/2023 Next Visit : No visit schedule

## 2023-12-27 NOTE — Telephone Encounter (Signed)
 pt notified last refill she needs OV, thx

## 2024-01-01 ENCOUNTER — Encounter: Payer: Self-pay | Admitting: Family

## 2024-01-01 ENCOUNTER — Telehealth (INDEPENDENT_AMBULATORY_CARE_PROVIDER_SITE_OTHER): Admitting: Family

## 2024-01-01 DIAGNOSIS — E1169 Type 2 diabetes mellitus with other specified complication: Secondary | ICD-10-CM

## 2024-01-01 DIAGNOSIS — I10 Essential (primary) hypertension: Secondary | ICD-10-CM

## 2024-01-01 DIAGNOSIS — F900 Attention-deficit hyperactivity disorder, predominantly inattentive type: Secondary | ICD-10-CM

## 2024-01-01 DIAGNOSIS — Z7984 Long term (current) use of oral hypoglycemic drugs: Secondary | ICD-10-CM

## 2024-01-01 MED ORDER — LOSARTAN POTASSIUM 25 MG PO TABS: 25.0000 mg | ORAL_TABLET | Freq: Two times a day (BID) | ORAL | 1 refills | Status: DC

## 2024-01-01 MED ORDER — LISDEXAMFETAMINE DIMESYLATE 30 MG PO CAPS: 30.0000 mg | ORAL_CAPSULE | Freq: Every day | ORAL | 0 refills | Status: DC

## 2024-01-01 MED ORDER — METFORMIN HCL 500 MG PO TABS: 500.0000 mg | ORAL_TABLET | Freq: Two times a day (BID) | ORAL | 1 refills | Status: DC

## 2024-01-01 NOTE — Assessment & Plan Note (Signed)
 Stable on Losartan 25mg  twice daily. No recent blood pressure readings available. -Continue Losartan twice daily. -Plan for office visit in three months to check blood pressure.

## 2024-01-01 NOTE — Progress Notes (Signed)
 MyChart Video Visit    Virtual Visit via Video Note   This format is felt to be most appropriate for this patient at this time. Physical exam was limited by quality of the video and audio technology used for the visit. CMA was able to get the patient set up on a video visit.  Patient location: Home. Patient and provider in visit Provider location: Office  I discussed the limitations of evaluation and management by telemedicine and the availability of in person appointments. The patient expressed understanding and agreed to proceed.  Visit Date: 01/01/2024  Today's healthcare provider: Dulce Sellar, NP     Subjective:   Patient ID: Haley Wyatt, female    DOB: [birth year], 48 y.o.   MRN: [id number]  Chief Complaint  Patient presents with   Hypertension   ADHD   Diabetes   Discussed the use of AI scribe software for clinical note transcription with the patient, who gave verbal consent to proceed.  History of Present Illness   The patient, with a history of hypertension, ADHD and diabetes, is due for a follow-up.  She has been managing her hypertension with losartan twice daily and her diabetes with metformin. She has been breaking the metformin in half, a practice that began when she was on [facility], however her insurance is no longer covering and her coupon copay increased significantly, so she had to stop using. She is not currently checking her blood sugar at home. She has previously been on Trulicity which caused her stomach to hurt and Ozempic did not work as well as [employer]. Pt has been taking generic Vyvanse 30mg  qd for her ADHD sx. She denies any SE and states the dose is working well for her.     Assessment & Plan:     Hypertension - Stable on Losartan 25mg  twice daily. No recent blood pressure readings available. -Continue Losartan twice daily. -Plan for office visit in three months to check blood pressure.  Type 2 Diabetes Mellitus - Stable on  Metformin. No recent blood glucose or A1c readings available. Patient also previously on Mounjaro, but due to cost stopped taking. -Continue Metformin. -Plan for office visit in three months to check labs including A1c. -Advised patient to research [facility]ep[facility] and [employer] for discounts. [facility]ep[facility] offers vials for $349/month, believe RX has to be sent to [place] direct, pt to let me know if interested. -F/U in 3 mos.  ADHD - Stable on Vyvanse. Sending refills for 30d x 3 mos. -F/U in office in 3 mos.     Past Medical History:  Diagnosis Date   ADD (attention deficit disorder)    ADHD    Allergy    Back pain    "muscle strain from exercising"   Back pain    Borderline diabetes    states "levels have been about 102"   Chest pain    Chronic headaches    Constipation    Corneal ulceration 06/23/2021   Diabetes mellitus without complication (HCC)    Dysmenorrhea 01/28/2015   Fibroid tumor    inside uterus   GERD 06/11/2010   Qualifier: Diagnosis of  By: Benjamin Stain MD, Maisie Fus     GERD (gastroesophageal reflux disease)    PT STATES "DOES NOT HAVE IT NOW"   H/O hiatal hernia    Heel pain, bilateral 06/23/2021   Hypertension    NO MEDS.. BP CONTROLLED   Hypothyroidism    HYPOTHYROIDISM 04/16/2009   Qualifier: Diagnosis of  By: Benjamin Stain MD,  Thomas     Joint pain    LEG EDEMA, 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   Qualifier: Diagnosis of  By: Benjamin Stain MD, Thomas     OBESITY 04/16/2009   Other fatigue 08/18/2020   Other specified hypothyroidism 08/18/2020   Sedimentation rate elevation 06/23/2021   SOB (shortness of breath) on exertion    Swelling of both lower extremities    Urge incontinence 05/02/2022    Past Surgical History:  Procedure Laterality Date   ABDOMINAL HYSTERECTOMY     ABDOMINOPLASTY/PANNICULECTOMY WITH LIPOSUCTION  02/15/2023   surgery in [place]   BILATERAL SALPINGECTOMY Bilateral 01/28/2015   Procedure: BILATERAL SALPINGECTOMY;  Surgeon: Hal Morales, MD;  Location: [facility];  Service: Gynecology;  Laterality: Bilateral;   CARPAL TUNNEL RELEASE Right    DILITATION & CURRETTAGE/HYSTROSCOPY WITH HYDROTHERMAL ABLATION N/A 04/16/2014   Procedure: DILATATION & CURETTAGE/HYSTEROSCOPY WITH HYDROTHERMAL ABLATION;  Surgeon: Kathreen Cosier, MD;  Location: [facility];  Service: Gynecology;  Laterality: N/A;   HIATAL HERNIA REPAIR N/A 02/11/2014   Procedure: LAPAROSCOPIC REPAIR OF HIATAL HERNIA;  Surgeon: Valarie Merino, MD;  Location: [facility];  Service: General;  Laterality: N/A;   LAPAROSCOPIC GASTRIC SLEEVE RESECTION N/A 02/11/2014   Procedure: LAPAROSCOPIC GASTRIC SLEEVE RESECTION;  Surgeon: Valarie Merino, MD;  Location: [facility];  Service: General;  Laterality: N/A;   SHOULDER SURGERY     left shoulder loose body removal   SUPRACERVICAL ABDOMINAL HYSTERECTOMY Bilateral 01/28/2015   Procedure: TOTAL SUPER CERVICAL ABDOMINAL HYSTERECTOMY BILATERAL SALPINGECTOMY;  Surgeon: Hal Morales, MD;  Location: [facility];  Service: Gynecology;  Laterality: Bilateral;   TUBAL LIGATION     UPPER GI ENDOSCOPY  02/11/2014   Procedure: UPPER GI ENDOSCOPY;  Surgeon: Valarie Merino, MD;  Location: [facility];  Service: General;;    Outpatient Medications Prior to Visit  Medication Sig Dispense Refill   busPIRone (BUSPAR) 5 MG tablet TAKE 1 TO 2 TABLETS BY MOUTH 3 TIMES DAILY AS NEEDED FOR 1 TO 2 WEEKS AT LEAST, THEN DECREASE TO TWICE A DAY OR DAILY USE 60 tablet 0   Cyanocobalamin (PHYSICIANS EZ USE B-12) 1000 MCG/ML KIT Inject 1 each as directed once a week. Patient receives injection on Thursday.     hydrocortisone (ANUSOL-HC) 25 MG suppository INSERT 1 SUPPOSITORY RECTALLY TWICE DAILY 12 suppository 1   losartan (COZAAR) 25 MG tablet Take 1 tablet (25 mg total) by mouth 2 (two) times daily. 180 tablet 1   metFORMIN (GLUCOPHAGE) 1000 MG tablet TAKE 1 TABLET BY MOUTH TWICE DAILY WITH MEALS 60 tablet 0   Multiple Vitamins-Minerals (MULTI-VITE) LIQD Take  30 mLs by mouth daily.     lisdexamfetamine (VYVANSE) 30 MG capsule Take 1 capsule (30 mg total) by mouth daily before breakfast. 30 capsule 0   fluconazole (DIFLUCAN) 150 MG tablet Take 1 tablet (150 mg total) by mouth every 3 (three) days. (Patient not taking: Reported on 01/01/2024) 2 tablet 0   ketoconazole (NIZORAL) 2 % cream Apply 1 application topically daily. (Patient not taking: Reported on 01/01/2024) 30 g 3   No facility-administered medications prior to visit.    No Known Allergies     Objective:   Physical Exam Vitals and nursing note reviewed.  Constitutional:      General: Pt is not in acute distress.    Appearance: Normal appearance.  HENT:     Head: Normocephalic.  Pulmonary:     Effort: No respiratory distress.  Musculoskeletal:     Cervical back:  Normal range of motion.  Skin:    General: Skin is dry.     Coloration: Skin is not pale.  Neurological:     Mental Status: Pt is alert and oriented to person, place, and time.  Psychiatric:        Mood and Affect: Mood normal.   LMP 04/28/2014   Wt Readings from Last 3 Encounters:  12/20/23 250 lb (113.4 kg)  12/12/23 248 lb (112.5 kg)  11/13/23 248 lb (112.5 kg)      I discussed the assessment and treatment plan with the patient. The patient was provided an opportunity to ask questions and all were answered. The patient agreed with the plan and demonstrated an understanding of the instructions.   The patient was advised to call back or seek an in-person evaluation if the symptoms worsen or if the condition fails to improve as anticipated.  Dulce Sellar, NP [facility] [phone] (phone) [phone] (fax)  [facility]

## 2024-01-01 NOTE — Assessment & Plan Note (Signed)
 Chronic taking Vyvanse 30 mg, working well, no SE Sending refills for 30d supply x 3 mos. Follow-up in 3 months, in office

## 2024-01-01 NOTE — Assessment & Plan Note (Signed)
 chronic last A1C here was 6.3, pt taking 1g Metformin bid, most recent A1C (in DM) was <5.0 taking 500mg  Metformin qam, advised to increase to bid since no longer on Mounjaro advised on checking with [employer] - cost of Zepbound which is same med as [facility]en is $349/month, may need RX sent directly, pt to let me know f/u in 3 mos, will recheck labs

## 2024-01-15 ENCOUNTER — Other Ambulatory Visit: Payer: Self-pay | Admitting: Family

## 2024-01-15 DIAGNOSIS — E1169 Type 2 diabetes mellitus with other specified complication: Secondary | ICD-10-CM

## 2024-01-25 ENCOUNTER — Encounter: Payer: Self-pay | Admitting: Radiology

## 2024-01-25 ENCOUNTER — Ambulatory Visit (INDEPENDENT_AMBULATORY_CARE_PROVIDER_SITE_OTHER): Payer: 59 | Admitting: Radiology

## 2024-01-25 VITALS — BP 118/76 | Ht 67.5 in | Wt 252.4 lb

## 2024-01-25 DIAGNOSIS — Z1331 Encounter for screening for depression: Secondary | ICD-10-CM | POA: Diagnosis not present

## 2024-01-25 DIAGNOSIS — Z01419 Encounter for gynecological examination (general) (routine) without abnormal findings: Secondary | ICD-10-CM

## 2024-01-25 NOTE — Patient Instructions (Signed)
 Preventive Care 16-50 Years Old, Female  Preventive care refers to lifestyle choices and visits with your health care provider that can promote health and wellness. Preventive care visits are also called wellness exams.  What can I expect for my preventive care visit?  Counseling  Your health care provider may ask you questions about your:  Medical history, including:  Past medical problems.  Family medical history.  Pregnancy history.  Current health, including:  Menstrual cycle.  Method of birth control.  Emotional well-being.  Home life and relationship well-being.  Sexual activity and sexual health.  Lifestyle, including:  Alcohol, nicotine or tobacco, and drug use.  Access to firearms.  Diet, exercise, and sleep habits.  Work and work Astronomer.  Sunscreen use.  Safety issues such as seatbelt and bike helmet use.  Physical exam  Your health care provider will check your:  Height and weight. These may be used to calculate your BMI (body mass index). BMI is a measurement that tells if you are at a healthy weight.  Waist circumference. This measures the distance around your waistline. This measurement also tells if you are at a healthy weight and may help predict your risk of certain diseases, such as type 2 diabetes and high blood pressure.  Heart rate and blood pressure.  Body temperature.  Skin for abnormal spots.  What immunizations do I need?    Vaccines are usually given at various ages, according to a schedule. Your health care provider will recommend vaccines for you based on your age, medical history, and lifestyle or other factors, such as travel or where you work.  What tests do I need?  Screening  Your health care provider may recommend screening tests for certain conditions. This may include:  Lipid and cholesterol levels.  Diabetes screening. This is done by checking your blood sugar (glucose) after you have not eaten for a while (fasting).  Pelvic exam and Pap test.  Hepatitis B test.  Hepatitis C  test.  HIV (human immunodeficiency virus) test.  STI (sexually transmitted infection) testing, if you are at risk.  Lung cancer screening.  Colorectal cancer screening.  Mammogram. Talk with your health care provider about when you should start having regular mammograms. This may depend on whether you have a family history of breast cancer.  BRCA-related cancer screening. This may be done if you have a family history of breast, ovarian, tubal, or peritoneal cancers.  Bone density scan. This is done to screen for osteoporosis.  Talk with your health care provider about your test results, treatment options, and if necessary, the need for more tests.  Follow these instructions at home:  Eating and drinking    Eat a diet that includes fresh fruits and vegetables, whole grains, lean protein, and low-fat dairy products.  Take vitamin and mineral supplements as recommended by your health care provider.  Do not drink alcohol if:  Your health care provider tells you not to drink.  You are pregnant, may be pregnant, or are planning to become pregnant.  If you drink alcohol:  Limit how much you have to 0-1 drink a day.  Know how much alcohol is in your drink. In the [place] one drink equals one 12 oz bottle of beer (355 mL), one 5 oz glass of wine (148 mL), or one 1 oz glass of hard liquor (44 mL).  Lifestyle  Brush your teeth every morning and night with fluoride toothpaste. Floss one time each day.  Exercise for at least  30 minutes 5 or more days each week.  Do not use any products that contain nicotine or tobacco. These products include cigarettes, chewing tobacco, and vaping devices, such as e-cigarettes. If you need help quitting, ask your health care provider.  Do not use drugs.  If you are sexually active, practice safe sex. Use a condom or other form of protection to prevent STIs.  If you do not wish to become pregnant, use a form of birth control. If you plan to become pregnant, see your health care provider for a  prepregnancy visit.  Take aspirin only as told by your health care provider. Make sure that you understand how much to take and what form to take. Work with your health care provider to find out whether it is safe and beneficial for you to take aspirin daily.  Find healthy ways to manage stress, such as:  Meditation, yoga, or listening to music.  Journaling.  Talking to a trusted person.  Spending time with friends and family.  Minimize exposure to UV radiation to reduce your risk of skin cancer.  Safety  Always wear your seat belt while driving or riding in a vehicle.  Do not drive:  If you have been drinking alcohol. Do not ride with someone who has been drinking.  When you are tired or distracted.  While texting.  If you have been using any mind-altering substances or drugs.  Wear a helmet and other protective equipment during sports activities.  If you have firearms in your house, make sure you follow all gun safety procedures.  Seek help if you have been physically or sexually abused.  What's next?  Visit your health care provider once a year for an annual wellness visit.  Ask your health care provider how often you should have your eyes and teeth checked.  Stay up to date on all vaccines.  This information is not intended to replace advice given to you by your health care provider. Make sure you discuss any questions you have with your health care provider.  Document Revised: 04/07/2021 Document Reviewed: 04/07/2021  Elsevier Patient Education  2024 [employer].

## 2024-01-25 NOTE — Progress Notes (Signed)
   Haley Wyatt [birth year] [id number]   History:  48 y.o. G4P4 presents for annual exam. No gyn concerns. Hyst 2016. Denies any menopausal symptoms.   Gynecologic History Hysterectomy: 2016  Sexually active: yes  Health Maintenance Last Pap: 2021 per pt. Results were: normal per pt, last documented 2014 Last mammogram: 2014. Results were: normal Last colonoscopy: 11/2023      01/25/2024   11:33 AM 01/01/2024    9:02 AM 05/22/2023   11:11 AM  Depression screen PHQ 2/9  Decreased Interest 0 0 0  Down, Depressed, Hopeless 0 0 0  PHQ - 2 Score 0 0 0  Altered sleeping  0   Tired, decreased energy  0   Change in appetite  0   Feeling bad or failure about yourself   0   Trouble concentrating  0   Moving slowly or fidgety/restless  0   Suicidal thoughts  0   PHQ-9 Score  0   Difficult doing work/chores  Not difficult at all      Past medical history, past surgical history, family history and social history were all reviewed and documented in the EPIC chart.  ROS:  A ROS was performed and pertinent positives and negatives are included.  Exam:  Vitals:   01/25/24 1131  BP: 118/76  Weight: 252 lb 6.4 oz (114.5 kg)  Height: 5' 7.5" (1.715 m)   Body mass index is 38.95 kg/m.  General appearance:  Normal Thyroid:  Symmetrical, normal in size, without palpable masses or nodularity. Respiratory  Auscultation:  Clear without wheezing or rhonchi Cardiovascular  Auscultation:  Regular rate, without rubs, murmurs or gallops  Edema/varicosities:  Not grossly evident Abdominal  Soft,nontender, without masses, guarding or rebound.  Liver/spleen:  No organomegaly noted  Hernia:  None appreciated  Skin  Inspection:  Grossly normal Breasts: Examined lying and sitting.   Right: Without masses, retractions, nipple discharge or axillary adenopathy.   Left: Without masses, retractions, nipple discharge or axillary adenopathy. Genitourinary   Inguinal/mons:  Normal without inguinal  adenopathy  External genitalia:  Normal appearing vulva with no masses, tenderness, or lesions  BUS/Urethra/Skene's glands:  Normal  Vagina:  Normal appearing with normal color and discharge, no lesions. Atrophy   Cervix:  normal without discharge or lesions  Uterus:  absent  Adnexa/parametria:     Rt: Normal in size, without masses or tenderness.   Lt: Normal in size, without masses or tenderness.  Anus and perineum: Normal   Raynelle Fanning, CMA present for exam  Assessment/Plan:   1. Well woman exam with routine gynecological exam (Primary) Pap 2026 Schedule mammogram   Return in 1 year for annual or sooner prn.  Arlie Solomons B WHNP-BC 11:52 AM 01/25/2024

## 2024-01-28 ENCOUNTER — Other Ambulatory Visit: Payer: Self-pay | Admitting: Family

## 2024-01-28 DIAGNOSIS — F418 Other specified anxiety disorders: Secondary | ICD-10-CM

## 2024-01-28 DIAGNOSIS — F900 Attention-deficit hyperactivity disorder, predominantly inattentive type: Secondary | ICD-10-CM

## 2024-01-30 NOTE — Telephone Encounter (Signed)
 We did not send the B12 med, ok to refill buspirone and Vyvanse was sent w/RX to keep on file

## 2024-02-06 ENCOUNTER — Other Ambulatory Visit (INDEPENDENT_AMBULATORY_CARE_PROVIDER_SITE_OTHER)

## 2024-02-06 DIAGNOSIS — K760 Fatty (change of) liver, not elsewhere classified: Secondary | ICD-10-CM | POA: Diagnosis not present

## 2024-02-06 DIAGNOSIS — R160 Hepatomegaly, not elsewhere classified: Secondary | ICD-10-CM | POA: Diagnosis not present

## 2024-02-06 LAB — HEPATIC FUNCTION PANEL
ALT: 9 U/L (ref 0–35)
AST: 12 U/L (ref 0–37)
Albumin: 4.2 g/dL (ref 3.5–5.2)
Alkaline Phosphatase: 75 U/L (ref 39–117)
Bilirubin, Direct: 0.1 mg/dL (ref 0.0–0.3)
Total Bilirubin: 0.3 mg/dL (ref 0.2–1.2)
Total Protein: 7.6 g/dL (ref 6.0–8.3)

## 2024-02-07 ENCOUNTER — Ambulatory Visit: Admitting: Family

## 2024-02-07 NOTE — Telephone Encounter (Signed)
 Pt would like to know if b12 can be sent in, you are not the prescriber.

## 2024-02-07 NOTE — Progress Notes (Deleted)
 Patient ID: Haley Wyatt, female    DOB: February 05, 1976, 48 y.o.   MRN: [id number]  No chief complaint on file.          Subjective:    Outpatient Medications Prior to Visit  Medication Sig Dispense Refill   busPIRone (BUSPAR) 5 MG tablet TAKE 1 TO 2 TABLETS BY MOUTH 3 TIMES DAILY AS NEEDED FOR 1 TO 2 WEEKS AT LEAST, THEN DECREASE TO TWICE A DAY OR DAILY USE 60 tablet 0   Cyanocobalamin (PHYSICIANS EZ USE B-12) 1000 MCG/ML KIT Inject 1 each as directed once a week. Patient receives injection on Thursday.     hydrocortisone (ANUSOL-HC) 25 MG suppository INSERT 1 SUPPOSITORY RECTALLY TWICE DAILY (Patient not taking: Reported on 01/25/2024) 12 suppository 1   lisdexamfetamine (VYVANSE) 30 MG capsule Take 1 capsule (30 mg total) by mouth daily before breakfast. 30 capsule 0   lisdexamfetamine (VYVANSE) 30 MG capsule Take 1 capsule (30 mg total) by mouth daily before breakfast. 30 capsule 0   [START ON 03/01/2024] lisdexamfetamine (VYVANSE) 30 MG capsule Take 1 capsule (30 mg total) by mouth daily before breakfast. 30 capsule 0   losartan (COZAAR) 25 MG tablet Take 1 tablet (25 mg total) by mouth 2 (two) times daily. 180 tablet 1   metFORMIN (GLUCOPHAGE) 500 MG tablet Take 1 tablet (500 mg total) by mouth 2 (two) times daily with a meal. 180 tablet 1   Multiple Vitamins-Minerals (MULTI-VITE) LIQD Take 30 mLs by mouth daily.     No facility-administered medications prior to visit.   Past Medical History:  Diagnosis Date   ADD (attention deficit disorder)    ADHD    Allergy    Back pain    "muscle strain from exercising"   Back pain    Borderline diabetes    states "levels have been about 102"   Chest pain    Chronic headaches    Constipation    Corneal ulceration 06/23/2021   Diabetes mellitus without complication (HCC)    Dysmenorrhea 01/28/2015   Fibroid tumor    inside uterus   GERD 06/11/2010   Qualifier: Diagnosis of  By: Benjamin Stain MD, Maisie Fus     GERD (gastroesophageal reflux  disease)    PT STATES "DOES NOT HAVE IT NOW"   H/O hiatal hernia    Heel pain, bilateral 06/23/2021   Hypertension    NO MEDS.. BP CONTROLLED   Hypothyroidism    HYPOTHYROIDISM 04/16/2009   Qualifier: Diagnosis of  By: Benjamin Stain MD, Thomas     Joint pain    LEG EDEMA, 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   Qualifier: Diagnosis of  By: Benjamin Stain MD, Thomas     OBESITY 04/16/2009   Other fatigue 08/18/2020   Other specified hypothyroidism 08/18/2020   Sedimentation rate elevation 06/23/2021   SOB (shortness of breath) on exertion    Swelling of both lower extremities    Urge incontinence 05/02/2022   Past Surgical History:  Procedure Laterality Date   ABDOMINAL HYSTERECTOMY     ABDOMINOPLASTY/PANNICULECTOMY WITH LIPOSUCTION  02/15/2023   surgery in [place]   BILATERAL SALPINGECTOMY Bilateral 01/28/2015   Procedure: BILATERAL SALPINGECTOMY;  Surgeon: Hal Morales, MD;  Location: [facility];  Service: Gynecology;  Laterality: Bilateral;   CARPAL TUNNEL RELEASE Right    DILITATION & CURRETTAGE/HYSTROSCOPY WITH HYDROTHERMAL ABLATION N/A 04/16/2014   Procedure: DILATATION & CURETTAGE/HYSTEROSCOPY WITH HYDROTHERMAL ABLATION;  Surgeon: Kathreen Cosier, MD;  Location: [facility];  Service: Gynecology;  Laterality: N/A;   HIATAL HERNIA  REPAIR N/A 02/11/2014   Procedure: LAPAROSCOPIC REPAIR OF HIATAL HERNIA;  Surgeon: Azucena Bollard, MD;  Location: [facility];  Service: General;  Laterality: N/A;   LAPAROSCOPIC GASTRIC SLEEVE RESECTION N/A 02/11/2014   Procedure: LAPAROSCOPIC GASTRIC SLEEVE RESECTION;  Surgeon: Azucena Bollard, MD;  Location: [facility];  Service: General;  Laterality: N/A;   SHOULDER SURGERY     left shoulder loose body removal   SUPRACERVICAL ABDOMINAL HYSTERECTOMY Bilateral 01/28/2015   Procedure: TOTAL SUPER CERVICAL ABDOMINAL HYSTERECTOMY BILATERAL SALPINGECTOMY;  Surgeon: Stevenson Elbe, MD;  Location: [facility];  Service: Gynecology;  Laterality: Bilateral;   TUBAL  LIGATION     UPPER GI ENDOSCOPY  02/11/2014   Procedure: UPPER GI ENDOSCOPY;  Surgeon: Azucena Bollard, MD;  Location: [facility];  Service: General;;   No Known Allergies    Objective:    Physical Exam Vitals and nursing note reviewed.  Constitutional:      Appearance: Normal appearance. She is obese.  Cardiovascular:     Rate and Rhythm: Normal rate and regular rhythm.  Pulmonary:     Effort: Pulmonary effort is normal.     Breath sounds: Normal breath sounds.  Musculoskeletal:        General: Normal range of motion.  Skin:    General: Skin is warm and dry.  Neurological:     Mental Status: She is alert.  Psychiatric:        Mood and Affect: Mood normal.        Behavior: Behavior normal.    LMP 04/28/2014  Wt Readings from Last 3 Encounters:  01/25/24 252 lb 6.4 oz (114.5 kg)  12/20/23 250 lb (113.4 kg)  12/12/23 248 lb (112.5 kg)       Versa Gore, NP

## 2024-02-08 ENCOUNTER — Other Ambulatory Visit: Payer: Self-pay

## 2024-02-08 DIAGNOSIS — E538 Deficiency of other specified B group vitamins: Secondary | ICD-10-CM

## 2024-02-08 LAB — ANTI-SMOOTH MUSCLE ANTIBODY, IGG: Actin (Smooth Muscle) Antibody (IGG): <20 U (ref <20)

## 2024-02-08 MED ORDER — PHYSICIANS EZ USE B-12 1000 MCG/ML IJ KIT: PACK | INTRAMUSCULAR | 11 refills | Status: AC

## 2024-02-08 NOTE — Telephone Encounter (Signed)
 ok to send in - Vit b12 def dx

## 2024-02-08 NOTE — Telephone Encounter (Signed)
 RX sent

## 2024-02-26 NOTE — Progress Notes (Deleted)
 [facility] Return Visit   Referring Provider Versa Gore, NP [address] [place],  [place] [address]  Primary Care Provider Versa Gore, NP  Patient Profile: Haley Wyatt is a 48 y.o. female who returns to the [facility] for follow-up of the problem(s) noted below.  Problem List: GERD Hiatal hernia Gastritis -H. pylori negative S/p gastric sleeve FIT test + with negative colonoscopy 11/2023 Colorectal cancer in multiple 2nd degree relatives - grandmother and cousin History of diverticulosis on imaging Internal hemorrhoids Hepatomegaly with mild steatosis c/w MASLD  History of Present Illness   Haley Wyatt was last seen in the GI office 11/13/2023 by Santina Cull, PA   Current GI Meds  None  Interval History   GERD/Gastritis -- Gastritis on EGD 11/2023 - H. Pylori negative   FIT test + with negative colonoscopy 11/2023 Colorectal cancer in multiple 2nd degree relatives - grandmother and cousin -- Endoscopically normal colonoscopy 05/25/2024 -- Internal hemorrhoids present which likely explains previous positive fit test and patient reports of rectal bleeding  Hepatomegaly with mild steatosis -- Chronic hepatitides labs performed 10/2023 -weakly positive ANA and ASMA 40 -- Repeat labs 01/2024 - normal LFTs and ASMA normal -- FIB4 = 0.70 - no evidence of advanced fibrosis  Last colonoscopy: 11/2023 - normal, internal hemorrhoids Last endoscopy:  11/2023 - gastritis without H. Pylori, HH  Last Abd CT/CTE/MRE:  CTAP 02/2023 -hiatal hernia with evidence of prior HH repair, hepatomegaly with mild steatosis, constipation and diverticulosis   GI Review of Symptoms Significant for {[id number]}. Otherwise negative.  General Review of Systems  Review of systems is significant for the pertinent positives and negatives as listed per the HPI.  Full ROS is otherwise negative.  Past Medical History   Past Medical History:   Diagnosis Date   ADD (attention deficit disorder)    ADHD    Allergy    Back pain    "muscle strain from exercising"   Back pain    Borderline diabetes    states "levels have been about 102"   Chest pain    Chronic headaches    Constipation    Corneal ulceration 06/23/2021   Diabetes mellitus without complication (HCC)    Dysmenorrhea 01/28/2015   Fibroid tumor    inside uterus   GERD 06/11/2010   Qualifier: Diagnosis of  By: Sandy Crumb MD, Andy Bannister     GERD (gastroesophageal reflux disease)    PT STATES "DOES NOT HAVE IT NOW"   H/O hiatal hernia    Heel pain, bilateral 06/23/2021   Hypertension    NO MEDS.. BP CONTROLLED   Hypothyroidism    HYPOTHYROIDISM 04/16/2009   Qualifier: Diagnosis of  By: Sandy Crumb MD, Thomas     Joint pain    LEG EDEMA, 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   Qualifier: Diagnosis of  By: Sandy Crumb MD, Thomas     OBESITY 04/16/2009   Other fatigue 08/18/2020   Other specified hypothyroidism 08/18/2020   Sedimentation rate elevation 06/23/2021   SOB (shortness of breath) on exertion    Swelling of both lower extremities    Urge incontinence 05/02/2022     Past Surgical History   Past Surgical History:  Procedure Laterality Date   ABDOMINAL HYSTERECTOMY     ABDOMINOPLASTY/PANNICULECTOMY WITH LIPOSUCTION  02/15/2023   surgery in [place]   BILATERAL SALPINGECTOMY Bilateral 01/28/2015   Procedure: BILATERAL SALPINGECTOMY;  Surgeon: Stevenson Elbe, MD;  Location: [facility];  Service: Gynecology;  Laterality: Bilateral;   CARPAL TUNNEL RELEASE  Right    DILITATION & CURRETTAGE/HYSTROSCOPY WITH HYDROTHERMAL ABLATION N/A 04/16/2014   Procedure: DILATATION & CURETTAGE/HYSTEROSCOPY WITH HYDROTHERMAL ABLATION;  Surgeon: Heide Livings, MD;  Location: [facility];  Service: Gynecology;  Laterality: N/A;   HIATAL HERNIA REPAIR N/A 02/11/2014   Procedure: LAPAROSCOPIC REPAIR OF HIATAL HERNIA;  Surgeon: Azucena Bollard, MD;  Location: [facility];  Service:  General;  Laterality: N/A;   LAPAROSCOPIC GASTRIC SLEEVE RESECTION N/A 02/11/2014   Procedure: LAPAROSCOPIC GASTRIC SLEEVE RESECTION;  Surgeon: Azucena Bollard, MD;  Location: [facility];  Service: General;  Laterality: N/A;   SHOULDER SURGERY     left shoulder loose body removal   SUPRACERVICAL ABDOMINAL HYSTERECTOMY Bilateral 01/28/2015   Procedure: TOTAL SUPER CERVICAL ABDOMINAL HYSTERECTOMY BILATERAL SALPINGECTOMY;  Surgeon: Stevenson Elbe, MD;  Location: [facility];  Service: Gynecology;  Laterality: Bilateral;   TUBAL LIGATION     UPPER GI ENDOSCOPY  02/11/2014   Procedure: UPPER GI ENDOSCOPY;  Surgeon: Azucena Bollard, MD;  Location: [facility];  Service: General;;     Allergies and Medications  No Known Allergies  @MEDSTODAY @  Family History   Family History  Problem Relation Age of Onset   Diverticulitis Mother    Alcoholism Mother    Hyperlipidemia Father    Hypertension Father    Diabetes Father    Schizophrenia Father    Depression Father    Mental illness Sister    Drug abuse Sister    Colon cancer Maternal Grandmother    Diabetes Paternal Grandmother    Sudden death Neg Hx    Heart attack Neg Hx    GI Specific Family History: {[id number]}   Social History   Social History   Tobacco Use   Smoking status: Former    Current packs/day: 0.00    Types: Cigarettes    Quit date: 02/05/2006    Years since quitting: 18.0   Smokeless tobacco: Never  Vaping Use   Vaping status: Never Used  Substance Use Topics   Alcohol use: Not Currently    Comment: Occasionally   Drug use: No   Haley Wyatt reports that she quit smoking about 18 years ago. Her smoking use included cigarettes. She has never used smokeless tobacco. She reports that she does not currently use alcohol. She reports that she does not use drugs.  Vital Signs and Physical Examination  There were no vitals filed for this visit. There is no height or weight on file to calculate BMI.    General: Well  developed, well nourished, no acute distress Head: Normocephalic and atraumatic Eyes: Sclerae anicteric, EOMI Ears: Normal auditory acuity Mouth: No deformities or lesions noted Lungs: Clear throughout to auscultation Heart: Regular rate and rhythm; No murmurs, rubs or bruits Abdomen: Soft, non tender and non distended. No masses, hepatosplenomegaly or hernias noted. Normal Bowel sounds Rectal: Musculoskeletal: Symmetrical with no gross deformities  Pulses:  Normal pulses noted Extremities: No edema or deformities noted Neurological: Alert oriented x 4, grossly nonfocal Psychological:  Alert and cooperative. Normal mood and affect   Review of Data  The following data was reviewed at the time of this encounter:  Laboratory Studies      Latest Ref Rng & Units 11/13/2023    4:07 PM 01/11/2023    9:47 AM 05/02/2022   10:22 AM  CBC  WBC 4.0 - 10.5 K/uL 7.5  8.0  7.8   Hemoglobin 12.0 - 15.0 g/dL 16.1  09.6  04.5   Hematocrit 36.0 - 46.0 %  42.6  42.2  41.9   Platelets 150.0 - 400.0 K/uL 280.0  221.0  218.0     No results found for: "LIPASE"    Latest Ref Rng & Units 02/06/2024    3:21 PM 11/13/2023    4:07 PM 05/02/2022   10:22 AM  CMP  Glucose 70 - 99 mg/dL  75  91   BUN 6 - 23 mg/dL  13  12   Creatinine 1.61 - 1.20 mg/dL  0.96  0.45   Sodium 409 - 145 mEq/L  137  137   Potassium 3.5 - 5.1 mEq/L  4.0  4.3   Chloride 96 - 112 mEq/L  101  103   CO2 19 - 32 mEq/L  27  29   Calcium 8.4 - 10.5 mg/dL  9.4  9.4   Total Protein 6.0 - 8.3 g/dL 7.6  8.0  7.5   Total Bilirubin 0.2 - 1.2 mg/dL 0.3  0.3  0.4   Alkaline Phos 39 - 117 U/L 75  61  63   AST 0 - 37 U/L 12  12  14    ALT 0 - 35 U/L 9  10  13       Imaging Studies  CTAP 03/07/2023 1. Postsurgical changes compatible with recent abdominoplasty. 2. Diffuse edema throughout the abdominal wall with subcutaneous stranding and scattered deep subcutaneous air pockets. Findings could be simply due to the recent surgery or could  indicate infectious complication such as cellulitis. 3. 8.5 x 2.1 x 1.8 cm hematoma in the midline abdominal wall infolding between the rectus abdominis muscles. 4. Complex fluid collections in the lateral lower chest wall bilaterally, 8 x 3.3 x 7.7 cm on the right and 4.6 x 3.2 x 4.1 cm on the left. These could be hematomas, seromas, or abscesses. 5. Constipation and diverticulosis. 6. Hepatomegaly with mild steatosis. 7. Hiatal hernia with evidence of prior hiatal hernia repair. 8. Disproportionate facet hypertrophy at L4-5 with grade 1 L4-5 anterolisthesis.  CTAP 01/16/2020 1. No acute findings. No findings to account for the patient's symptoms. No evidence of stomach or bowel inflammation or obstruction.    GI Procedures and Studies   EGD/Colonoscopy 12/12/2023 EGD - gastritis without H. Pylori, HH Colonoscopy - normal, internal hemorrhoids  Clinical Impression  It is my clinical impression that Haley Wyatt is a 49 y.o. female with;  GERD Hiatal hernia Gastritis -H. pylori negative S/p gastric sleeve FIT test + with negative colonoscopy 11/2023 Colorectal cancer in multiple 2nd degree relatives - grandmother and cousin History of diverticulosis on imaging Internal hemorrhoids Hepatomegaly with mild steatosis c/w MASLD  Hiawatha returns to the office for follow-up of multiple gastrointestinal issues outlined above.  She recently underwent EGD/colonoscopy to 2025.  EGD revealed evidence of gastric sleeve anatomy with H. pylori negative gastritis and presence of hiatal hernia.  Colonoscopy was endoscopically normal.  Internal hemorrhoids were present which may explain recent positive FIT test and rectal bleeding.  Plan  *** *** *** *** ***   Planned Follow Up No follow-ups on file.  The patient or caregiver verbalized understanding of the material covered, with no barriers to understanding. All questions were answered. Patient or caregiver is agreeable with the plan  outlined above.    It was a pleasure to see Burns.  If you have any questions or concerns regarding this evaluation, do not hesitate to contact me.  Eugenia Hess, MD [facility]

## 2024-02-27 ENCOUNTER — Ambulatory Visit: Payer: 59 | Admitting: Pediatrics

## 2024-03-08 ENCOUNTER — Other Ambulatory Visit: Payer: Self-pay | Admitting: Family

## 2024-03-08 DIAGNOSIS — E1169 Type 2 diabetes mellitus with other specified complication: Secondary | ICD-10-CM

## 2024-04-03 ENCOUNTER — Other Ambulatory Visit: Payer: Self-pay

## 2024-04-03 ENCOUNTER — Other Ambulatory Visit: Payer: Self-pay | Admitting: Family

## 2024-04-03 MED ORDER — METFORMIN HCL 1000 MG PO TABS: 1000.0000 mg | ORAL_TABLET | Freq: Two times a day (BID) | ORAL | 0 refills | Status: DC

## 2024-04-17 ENCOUNTER — Encounter: Payer: Self-pay | Admitting: Family

## 2024-04-19 ENCOUNTER — Ambulatory Visit (INDEPENDENT_AMBULATORY_CARE_PROVIDER_SITE_OTHER): Admitting: Family

## 2024-04-19 ENCOUNTER — Encounter: Payer: Self-pay | Admitting: Family

## 2024-04-19 VITALS — BP 122/78 | HR 80 | Temp 97.4°F | Ht 67.5 in | Wt 257.6 lb

## 2024-04-19 DIAGNOSIS — R4586 Emotional lability: Secondary | ICD-10-CM | POA: Diagnosis not present

## 2024-04-19 DIAGNOSIS — Z8639 Personal history of other endocrine, nutritional and metabolic disease: Secondary | ICD-10-CM | POA: Diagnosis not present

## 2024-04-19 DIAGNOSIS — F900 Attention-deficit hyperactivity disorder, predominantly inattentive type: Secondary | ICD-10-CM | POA: Diagnosis not present

## 2024-04-19 DIAGNOSIS — F418 Other specified anxiety disorders: Secondary | ICD-10-CM

## 2024-04-19 DIAGNOSIS — Z7984 Long term (current) use of oral hypoglycemic drugs: Secondary | ICD-10-CM

## 2024-04-19 DIAGNOSIS — E1169 Type 2 diabetes mellitus with other specified complication: Secondary | ICD-10-CM

## 2024-04-19 DIAGNOSIS — Z0000 Encounter for general adult medical examination without abnormal findings: Secondary | ICD-10-CM

## 2024-04-19 DIAGNOSIS — I10 Essential (primary) hypertension: Secondary | ICD-10-CM | POA: Diagnosis not present

## 2024-04-19 DIAGNOSIS — Z1231 Encounter for screening mammogram for malignant neoplasm of breast: Secondary | ICD-10-CM

## 2024-04-19 MED ORDER — LISDEXAMFETAMINE DIMESYLATE 30 MG PO CAPS: 30.0000 mg | ORAL_CAPSULE | Freq: Every day | ORAL | 0 refills | Status: DC

## 2024-04-19 MED ORDER — LOSARTAN POTASSIUM 25 MG PO TABS: 25.0000 mg | ORAL_TABLET | Freq: Two times a day (BID) | ORAL | 1 refills | Status: DC

## 2024-04-19 MED ORDER — BUSPIRONE HCL 5 MG PO TABS: 5.0000 mg | ORAL_TABLET | Freq: Two times a day (BID) | ORAL | 2 refills | Status: AC | PRN

## 2024-04-19 NOTE — Assessment & Plan Note (Addendum)
 Stable on Losartan  25mg  twice daily.  -Continue Losartan  twice daily, sending refill. -Plan for office visit in 6 months to check blood pressure.

## 2024-04-19 NOTE — Progress Notes (Signed)
 Phone [phone]  Subjective:   Patient is a 48 y.o. female presenting for annual physical.    Chief Complaint  Patient presents with   Annual Exam   ADHD   Anxiety  Discussed the use of AI scribe software for clinical note transcription with the patient, who gave verbal consent to proceed.  History of Present Illness Haley Wyatt is a 48 year old female who presents with concerns about medication management and symptoms possibly related to menopause.  She has stopped Mounjaro  due to insurance and cost issues, affecting her weight management. She feels frustrated with her productivity and experiences burnout, compounded by a lack of exercise since October. She takes Vyvanse  for ADHD but feels dependent on it for mental alertness, though she is on a low dose and does not experience adverse effects. Her best friend's recent death has led to emotional eating, adding to her stress. She experiences sore breasts, mood swings, and vaginal dryness, which she associates with menopause. She denies hot flashes or night sweats. She sleeps four to five hours per night and feels exhausted, though she has never been an eight-hour sleeper. She has not had a mammogram recently and missed a follow-up colonoscopy. She was informed of a twisted stomach, which could be dangerous. She has had a hysterectomy and does not menstruate. She has not been informed of low vitamin D levels in a long time.  See problem oriented charting- ROS- full  review of systems was completed and negative except for: what is noted in HPI above.  The following were reviewed and entered/updated in epic: Past Medical History:  Diagnosis Date   ADD (attention deficit disorder)    ADHD    Allergy    Back pain    muscle strain from exercising   Back pain    Borderline diabetes    states levels have been about 102   Chest pain    Chronic headaches    Constipation    Corneal ulceration 06/23/2021   Diabetes mellitus  without complication (HCC)    Dysmenorrhea 01/28/2015   Fibroid tumor    inside uterus   GERD 06/11/2010   Qualifier: Diagnosis of  By: Curtis MD, Debby     GERD (gastroesophageal reflux disease)    PT STATES DOES NOT HAVE IT NOW   H/O hiatal hernia    Heel pain, bilateral 06/23/2021   Hypertension    NO MEDS.. BP CONTROLLED   Hypothyroidism    HYPOTHYROIDISM 04/16/2009   Qualifier: Diagnosis of  By: Curtis MD, Thomas     Joint pain    LEG EDEMA, 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   Qualifier: Diagnosis of  By: Curtis MD, Thomas     OBESITY 04/16/2009   Other fatigue 08/18/2020   Other specified hypothyroidism 08/18/2020   Sedimentation rate elevation 06/23/2021   SOB (shortness of breath) on exertion    Swelling of both lower extremities    Urge incontinence 05/02/2022   Patient Active Problem List   Diagnosis Date Noted   Drug-induced constipation 05/22/2023   Situational anxiety 03/30/2023   Type 2 diabetes mellitus with morbid obesity (HCC) 06/15/2022   Uterine leiomyoma 05/02/2022   Menorrhagia 05/02/2022   Attention deficit disorder 05/02/2022   Essential hypertension 08/18/2020   At risk for heart disease 08/18/2020   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   Morbid obesity (HCC) 07/16/2013   CHONDROMALACIA PATELLA, 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   Past Surgical History:  Procedure Laterality Date   ABDOMINAL HYSTERECTOMY     ABDOMINOPLASTY/PANNICULECTOMY  WITH LIPOSUCTION  02/15/2023   surgery in [place]   BILATERAL SALPINGECTOMY Bilateral 01/28/2015   Procedure: BILATERAL SALPINGECTOMY;  Surgeon: Shanda SHAUNNA Muscat, MD;  Location: [facility];  Service: Gynecology;  Laterality: Bilateral;   CARPAL TUNNEL RELEASE Right    DILITATION & CURRETTAGE/HYSTROSCOPY WITH HYDROTHERMAL ABLATION N/A 04/16/2014   Procedure: DILATATION & CURETTAGE/HYSTEROSCOPY WITH HYDROTHERMAL ABLATION;  Surgeon: Aida DELENA Na, MD;  Location: [facility];  Service: Gynecology;   Laterality: N/A;   HIATAL HERNIA REPAIR N/A 02/11/2014   Procedure: LAPAROSCOPIC REPAIR OF HIATAL HERNIA;  Surgeon: Donnice KATHEE Lunger, MD;  Location: [facility];  Service: General;  Laterality: N/A;   LAPAROSCOPIC GASTRIC SLEEVE RESECTION N/A 02/11/2014   Procedure: LAPAROSCOPIC GASTRIC SLEEVE RESECTION;  Surgeon: Donnice KATHEE Lunger, MD;  Location: [facility];  Service: General;  Laterality: N/A;   SHOULDER SURGERY     left shoulder loose body removal   SUPRACERVICAL ABDOMINAL HYSTERECTOMY Bilateral 01/28/2015   Procedure: TOTAL SUPER CERVICAL ABDOMINAL HYSTERECTOMY BILATERAL SALPINGECTOMY;  Surgeon: Shanda SHAUNNA Muscat, MD;  Location: [facility];  Service: Gynecology;  Laterality: Bilateral;   TUBAL LIGATION     UPPER GI ENDOSCOPY  02/11/2014   Procedure: UPPER GI ENDOSCOPY;  Surgeon: Donnice KATHEE Lunger, MD;  Location: [facility];  Service: General;;    Family History  Problem Relation Age of Onset   Diverticulitis Mother    Alcoholism Mother    Hyperlipidemia Father    Hypertension Father    Diabetes Father    Schizophrenia Father    Depression Father    Mental illness Sister    Drug abuse Sister    Colon cancer Maternal Grandmother    Diabetes Paternal Grandmother    Sudden death Neg Hx    Heart attack Neg Hx     Medications- reviewed and updated Current Outpatient Medications  Medication Sig Dispense Refill   busPIRone  (BUSPAR ) 5 MG tablet TAKE 1 TO 2 TABLETS BY MOUTH 3 TIMES DAILY AS NEEDED FOR 1 TO 2 WEEKS AT LEAST, THEN DECREASE TO TWICE A DAY OR DAILY USE 60 tablet 0   Cyanocobalamin (PHYSICIANS EZ USE B-12) 1000 MCG/ML KIT Inject 1 each as directed once a month. 1 kit 11   losartan  (COZAAR ) 25 MG tablet Take 1 tablet (25 mg total) by mouth 2 (two) times daily. 180 tablet 1   metFORMIN  (GLUCOPHAGE ) 1000 MG tablet TAKE 1 TABLET BY MOUTH TWICE DAILY WITH MEALS 60 tablet 0   metFORMIN  (GLUCOPHAGE ) 1000 MG tablet Take 1 tablet (1,000 mg total) by mouth 2 (two) times daily with a meal. 60 tablet 0    Multiple Vitamins-Minerals (MULTI-VITE) LIQD Take 30 mLs by mouth daily.     lisdexamfetamine (VYVANSE ) 30 MG capsule Take 1 capsule (30 mg total) by mouth daily before breakfast. 30 capsule 0   lisdexamfetamine (VYVANSE ) 30 MG capsule Take 1 capsule (30 mg total) by mouth daily before breakfast. 30 capsule 0   lisdexamfetamine (VYVANSE ) 30 MG capsule Take 1 capsule (30 mg total) by mouth daily before breakfast. 30 capsule 0   No current facility-administered medications for this visit.    Allergies-reviewed and updated No Known Allergies  Social History   Social History Narrative   Marital status: single      Children: four      Employment:  Mental health professional      Tobacco: none       Alcohol: wine per year.      Drugs: none  Exercise: stopped exercise one month ago.    Objective:  BP 122/78   Pulse 80   Temp (!) 97.4 F (36.3 C)   Ht 5' 7.5 (1.715 m)   Wt 257 lb 9.6 oz (116.8 kg)   LMP 04/28/2014   SpO2 98%   BMI 39.75 kg/m  Physical Exam Vitals and nursing note reviewed.  Constitutional:      Appearance: Normal appearance. She is obese.  HENT:     Head: Normocephalic.     Right Ear: Tympanic membrane normal.     Left Ear: Tympanic membrane normal.     Nose: Nose normal.     Mouth/Throat:     Mouth: Mucous membranes are moist.   Eyes:     Pupils: Pupils are equal, round, and reactive to light.    Cardiovascular:     Rate and Rhythm: Normal rate and regular rhythm.  Pulmonary:     Effort: Pulmonary effort is normal.     Breath sounds: Normal breath sounds.   Musculoskeletal:        General: Normal range of motion.     Cervical back: Normal range of motion.  Lymphadenopathy:     Cervical: No cervical adenopathy.   Skin:    General: Skin is warm and dry.   Neurological:     Mental Status: She is alert.   Psychiatric:        Mood and Affect: Mood normal.        Behavior: Behavior normal.     Assessment and Plan   Health  Maintenance counseling: 1. Anticipatory guidance: Patient counseled regarding regular dental exams q6 months, eye exams,  avoiding smoking and second hand smoke, limiting alcohol to 1 beverage per day, no illicit drugs.   2. Risk factor reduction:  Advised patient of need for regular exercise and diet rich with fruits and vegetables to reduce risk of heart attack and stroke. Exercise- has not been lately.  Wt Readings from Last 3 Encounters:  04/19/24 257 lb 9.6 oz (116.8 kg)  01/25/24 252 lb 6.4 oz (114.5 kg)  12/20/23 250 lb (113.4 kg)   3. Immunizations/screenings/ancillary studies Immunization History  Administered Date(s) Administered   Influenza, Seasonal, Injecte, Preservative Fre 01/15/2015   PFIZER(Purple Top)SARS-COV-2 Vaccination 01/07/2020, 01/28/2020   PPD Test 04/08/2011   Tdap 12/11/2019   Health Maintenance Due  Topic Date Due   OPHTHALMOLOGY EXAM  Never done   Hepatitis B Vaccines (1 of 3 - 19+ 3-dose series) Never done   HEMOGLOBIN A1C  11/02/2022    4. Cervical cancer screening- hx of hysterectomy 5. Breast cancer screening-  mammogram ordering today. 6. Colon cancer screening -  done 2025 - states told after needed f/u d/t twisted colon? 7. Skin cancer screening- advised regular sunscreen use. Denies worrisome, changing, or new skin lesions.  8. Birth control/STD check- none 9. Osteoporosis screening- N/A 10. Alcohol screening: rare 11. Smoking associated screening (lung cancer screening, AAA screen 65-75, UA)- non- smoker Assessment & Plan Menopausal symptoms Symptoms suggestive of menopause, including mood changes, and fatigue. Post-hysterectomy, no vasomotor symptoms. Discussed FSH levels to confirm menopausal status. Mastalgia requires mammogram evaluation. - Order FSH test to assess menopausal status. - Order mammogram to evaluate mastalgia.  HTN- Stable on Losartan  25mg  twice daily.  -Continue Losartan  twice daily, sending refill. -Plan for office  visit in 6 months to check blood pressure.  Anxiety- chronic pt taking Buspar  5-10mg  bid prn sending refill f/u 28m-1 yr  T2DM- chronic last  A1C here was 6.3 no longer can afford Mounjaro  weight back up taking 500mg  Metformin  bid   continue to advise on low carb diet, restart exercise regimen f/u in 3-6 mo   Attention Deficit Hyperactivity Disorder (ADHD) ADHD managed with Vyvanse . Discussed medication dependence concerns and need for follow-up prescription. Denies SE. - Prescribe Vyvanse  30mg  #30 x 59mo  for ADHD management. - Schedule follow-up appointment for ADHD management in 3 months.  General Health Maintenance Overdue for mammogram. Missed colonoscopy follow-up due to gastrointestinal issue. Emphasized importance of regular screenings. Discussed potential volvulus risk. - Order full CPE lab panel - Order mammogram. - Schedule follow-up appointment with GI to evaluate gastrointestinal issue further.   Recommended follow up:  Return in about 3 months (around 07/20/2024) for ADHD f/u, virtual ok, med refills. Future Appointments  Date Time Provider Department Center  07/19/2024 10:00 AM Lucius Krabbe, NP [facility]    Lab/Order associations: not-fasting   Lucius Krabbe, NP

## 2024-04-19 NOTE — Assessment & Plan Note (Signed)
 chronic last A1C here was 6.3 no longer can afford Mounjaro  weight back up taking 500mg  Metformin  bid   continue to advise on low carb diet, restart exercise regimen f/u in 3-6 mo

## 2024-04-19 NOTE — Patient Instructions (Addendum)
 It was very nice to see you today!   I will review your lab results via MyChart in a few days. Med refills sent to pharmacy. Schedule 3 month follow up for refills, ok if virtual.     PLEASE NOTE:  If you had any lab tests please let us  know if you have not heard back within a few days. You may see your results on MyChart before we have a chance to review them but we will give you a call once they are reviewed by us . If we ordered any referrals today, please let us  know if you have not heard from their office within the next week.

## 2024-04-19 NOTE — Assessment & Plan Note (Signed)
chronic pt taking Buspar 5-10mg  bid prn sending refill f/u 68m-1 yr

## 2024-04-19 NOTE — Assessment & Plan Note (Signed)
 Chronic taking Vyvanse  30 mg, working well, no SE Sending refills for 30d supply x 3 mos. Follow-up in 3 months, virtual ok

## 2024-04-20 LAB — HEMOGLOBIN A1C
Hgb A1c MFr Bld: 5.8 % — ABNORMAL HIGH (ref <5.7)
Mean Plasma Glucose: 120 mg/dL
eAG (mmol/L): 6.6 mmol/L

## 2024-04-20 LAB — CBC WITH DIFFERENTIAL/PLATELET
Absolute Lymphocytes: 3042 {cells}/uL (ref 850–3900)
Absolute Monocytes: 681 {cells}/uL (ref 200–950)
Basophils Absolute: 41 {cells}/uL (ref 0–200)
Basophils Relative: 0.5 %
Eosinophils Absolute: 41 {cells}/uL (ref 15–500)
Eosinophils Relative: 0.5 %
HCT: 40.4 % (ref 35.0–45.0)
Hemoglobin: 12.6 g/dL (ref 11.7–15.5)
MCH: 27.4 pg (ref 27.0–33.0)
MCHC: 31.2 g/dL — ABNORMAL LOW (ref 32.0–36.0)
MCV: 87.8 fL (ref 80.0–100.0)
MPV: 10.5 fL (ref 7.5–12.5)
Monocytes Relative: 8.3 %
Neutro Abs: 4395 {cells}/uL (ref 1500–7800)
Neutrophils Relative %: 53.6 %
Platelets: 228 10*3/uL (ref 140–400)
RBC: 4.6 10*6/uL (ref 3.80–5.10)
RDW: 13.1 % (ref 11.0–15.0)
Total Lymphocyte: 37.1 %
WBC: 8.2 10*3/uL (ref 3.8–10.8)

## 2024-04-20 LAB — COMPREHENSIVE METABOLIC PANEL WITH GFR
AG Ratio: 1.4 (calc) (ref 1.0–2.5)
ALT: 16 U/L (ref 6–29)
AST: 14 U/L (ref 10–35)
Albumin: 3.9 g/dL (ref 3.6–5.1)
Alkaline phosphatase (APISO): 60 U/L (ref 31–125)
BUN: 18 mg/dL (ref 7–25)
CO2: 26 mmol/L (ref 20–32)
Calcium: 9.1 mg/dL (ref 8.6–10.2)
Chloride: 105 mmol/L (ref 98–110)
Creat: 0.79 mg/dL (ref 0.50–0.99)
Globulin: 2.8 g/dL (ref 1.9–3.7)
Glucose, Bld: 84 mg/dL (ref 65–99)
Potassium: 4.1 mmol/L (ref 3.5–5.3)
Sodium: 139 mmol/L (ref 135–146)
Total Bilirubin: 0.2 mg/dL (ref 0.2–1.2)
Total Protein: 6.7 g/dL (ref 6.1–8.1)
eGFR: 92 mL/min/{1.73_m2} (ref >=60)

## 2024-04-20 LAB — LIPID PANEL
Cholesterol: 180 mg/dL (ref <200)
HDL: 69 mg/dL (ref >=50)
LDL Cholesterol (Calc): 90 mg/dL
Non-HDL Cholesterol (Calc): 111 mg/dL (ref <130)
Total CHOL/HDL Ratio: 2.6 (calc) (ref <5.0)
Triglycerides: 115 mg/dL (ref <150)

## 2024-04-20 LAB — VITAMIN D 25 HYDROXY (VIT D DEFICIENCY, FRACTURES): Vit D, 25-Hydroxy: 33 ng/mL (ref 30–100)

## 2024-04-20 LAB — FOLLICLE STIMULATING HORMONE: FSH: 8.2 m[IU]/mL

## 2024-04-20 LAB — TSH: TSH: 1.67 m[IU]/L

## 2024-04-22 ENCOUNTER — Ambulatory Visit: Payer: Self-pay | Admitting: Family

## 2024-04-22 DIAGNOSIS — F900 Attention-deficit hyperactivity disorder, predominantly inattentive type: Secondary | ICD-10-CM

## 2024-04-25 ENCOUNTER — Ambulatory Visit (INDEPENDENT_AMBULATORY_CARE_PROVIDER_SITE_OTHER)

## 2024-04-25 ENCOUNTER — Encounter: Payer: Self-pay | Admitting: Emergency Medicine

## 2024-04-25 ENCOUNTER — Ambulatory Visit: Admission: EM | Admit: 2024-04-25 | Discharge: 2024-04-25 | Disposition: A | Discharge status: Home / Self Care

## 2024-04-25 DIAGNOSIS — L089 Local infection of the skin and subcutaneous tissue, unspecified: Secondary | ICD-10-CM | POA: Diagnosis not present

## 2024-04-25 DIAGNOSIS — M7989 Other specified soft tissue disorders: Secondary | ICD-10-CM | POA: Diagnosis not present

## 2024-04-25 MED ORDER — KETOROLAC TROMETHAMINE 30 MG/ML IJ SOLN
30.0000 mg | Freq: Once | INTRAMUSCULAR | Status: AC
  Administered 2024-04-25: 30 mg via INTRAMUSCULAR

## 2024-04-25 MED ORDER — IBUPROFEN 800 MG PO TABS: 800.0000 mg | ORAL_TABLET | Freq: Three times a day (TID) | ORAL | 0 refills | Status: DC

## 2024-04-25 MED ORDER — AMOXICILLIN-POT CLAVULANATE 875-125 MG PO TABS: 1.0000 | ORAL_TABLET | Freq: Two times a day (BID) | ORAL | 0 refills | Status: DC

## 2024-04-25 NOTE — Discharge Instructions (Addendum)
 Early presentation of a pulp space infection may be treated with warm soaks, rest, elevation, and oral antibiotics.  Take the Augmentin  twice daily with food for the next 7 days as prescribed.  Soak the finger in warm water with antibacterial solution like Hibiclens  or Epsom salt twice daily for 10 to 15 minutes at a time.   You can take 100 mg of ibuprofen  every 8 hours starting tomorrow.  For any breakthrough pain today you can take 500 mg of Tylenol  as needed.  The Toradol  should start working within the next 30 minutes to help with pain and inflammation.  If your symptoms do not improve over the next 48 to 72 hours, please seek further care at the nearest emergency department, as IV antibiotics may be indicated.  Hopefully we caught the infection early enough and oral antibiotics will suffice.

## 2024-04-25 NOTE — ED Provider Notes (Signed)
 [name]    CSN: [id number] Arrival date & time: 04/25/24  1235      History   Chief Complaint Chief Complaint  Patient presents with   Finger Injury    HPI Timera Windt is a 48 y.o. female.   Patient presents to clinic for concern of an infection of her distal right middle finger.  She got her nails done 7 days ago and noticed that she was having pain along the distal aspect of her finger, along where her acrylic nails were.  She told the nail salon yesterday and they remove the nail.  Since then the area has gotten more warm, hot swollen and is throbbing.  Has been taking Aleve  without much improvement.  Feel like she had a fever today and was unable to sleep last night due to pain.  Has not had any injuries.   The history is provided by the patient and medical records.    Past Medical History:  Diagnosis Date   ADD (attention deficit disorder)    ADHD    Allergy    Back pain    muscle strain from exercising   Back pain    Borderline diabetes    states levels have been about 102   Chest pain    Chronic headaches    Constipation    Corneal ulceration 06/23/2021   Diabetes mellitus without complication (HCC)    Dysmenorrhea 01/28/2015   Fibroid tumor    inside uterus   GERD 06/11/2010   Qualifier: Diagnosis of  By: Curtis MD, Debby     GERD (gastroesophageal reflux disease)    PT STATES DOES NOT HAVE IT NOW   H/O hiatal hernia    Heel pain, bilateral 06/23/2021   Hypertension    NO MEDS.. BP CONTROLLED   Hypothyroidism    HYPOTHYROIDISM 04/16/2009   Qualifier: Diagnosis of  By: Curtis MD, Thomas     Joint pain    LEG EDEMA, 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   Qualifier: Diagnosis of  By: Curtis MD, Thomas     OBESITY 04/16/2009   Other fatigue 08/18/2020   Other specified hypothyroidism 08/18/2020   Sedimentation rate elevation 06/23/2021   SOB (shortness of breath) on exertion    Swelling of both lower extremities    Urge  incontinence 05/02/2022    Patient Active Problem List   Diagnosis Date Noted   Drug-induced constipation 05/22/2023   Situational anxiety 03/30/2023   Type 2 diabetes mellitus with morbid obesity (HCC) 06/15/2022   Uterine leiomyoma 05/02/2022   Menorrhagia 05/02/2022   Attention deficit disorder 05/02/2022   Essential hypertension 08/18/2020   At risk for heart disease 08/18/2020   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   Morbid obesity (HCC) 07/16/2013   CHONDROMALACIA PATELLA, 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    Past Surgical History:  Procedure Laterality Date   ABDOMINAL HYSTERECTOMY     ABDOMINOPLASTY/PANNICULECTOMY WITH LIPOSUCTION  02/15/2023   surgery in [place]   BILATERAL SALPINGECTOMY Bilateral 01/28/2015   Procedure: BILATERAL SALPINGECTOMY;  Surgeon: Shanda SHAUNNA Muscat, MD;  Location: [facility];  Service: Gynecology;  Laterality: Bilateral;   CARPAL TUNNEL RELEASE Right    DILITATION & CURRETTAGE/HYSTROSCOPY WITH HYDROTHERMAL ABLATION N/A 04/16/2014   Procedure: DILATATION & CURETTAGE/HYSTEROSCOPY WITH HYDROTHERMAL ABLATION;  Surgeon: Aida DELENA Na, MD;  Location: [facility];  Service: Gynecology;  Laterality: N/A;   HIATAL HERNIA REPAIR N/A 02/11/2014   Procedure: LAPAROSCOPIC REPAIR OF HIATAL HERNIA;  Surgeon: Donnice KATHEE Lunger, MD;  Location: [facility];  Service: General;  Laterality: N/A;   LAPAROSCOPIC GASTRIC SLEEVE RESECTION N/A 02/11/2014   Procedure: LAPAROSCOPIC GASTRIC SLEEVE RESECTION;  Surgeon: Donnice KATHEE Lunger, MD;  Location: [facility];  Service: General;  Laterality: N/A;   SHOULDER SURGERY     left shoulder loose body removal   SUPRACERVICAL ABDOMINAL HYSTERECTOMY Bilateral 01/28/2015   Procedure: TOTAL SUPER CERVICAL ABDOMINAL HYSTERECTOMY BILATERAL SALPINGECTOMY;  Surgeon: Shanda SHAUNNA Muscat, MD;  Location: [facility];  Service: Gynecology;  Laterality: Bilateral;   TUBAL LIGATION     UPPER GI ENDOSCOPY  02/11/2014   Procedure: UPPER GI ENDOSCOPY;   Surgeon: Donnice KATHEE Lunger, MD;  Location: [facility];  Service: General;;    OB History     Gravida  4   Para  4   Term      Preterm      AB      Living  4      SAB      IAB      Ectopic      Multiple      Live Births               Home Medications    Prior to Admission medications   Medication Sig Start Date End Date Taking? Authorizing Provider  amoxicillin -clavulanate (AUGMENTIN ) 875-125 MG tablet Take 1 tablet by mouth every 12 (twelve) hours. 04/25/24  Yes Kavian Peters  N, FNP  diclofenac  Sodium (VOLTAREN ) 1 % GEL Apply 2 g topically. 04/10/20  Yes [provider]  ibuprofen  (ADVIL ) 800 MG tablet Take 1 tablet (800 mg total) by mouth 3 (three) times daily. 04/25/24  Yes Samaia Iwata  N, FNP  lidocaine  (XYLOCAINE ) 2 % jelly Apply 5 mLs topically. 07/11/23  Yes [provider]  naproxen  sodium (ALEVE ) 220 MG tablet Take 220 mg by mouth.   Yes [provider]  SUTAB  [phone] MG TABS Take by mouth. 11/15/23  Yes [provider]  Wound Cleansers (VASHE CLEANSING) SOLN Apply 1 Application topically. 07/11/23  Yes [provider]  busPIRone  (BUSPAR ) 5 MG tablet Take 1-2 tablets (5-10 mg total) by mouth 2 (two) times daily as needed (Anxiety.). 04/19/24   Lucius Krabbe, NP  Cyanocobalamin (PHYSICIANS EZ USE B-12) 1000 MCG/ML KIT Inject 1 each as directed once a month. 02/08/24   Lucius Krabbe, NP  lisdexamfetamine (VYVANSE ) 30 MG capsule Take 1 capsule (30 mg total) by mouth daily before breakfast. 04/19/24 05/19/24  Lucius Krabbe, NP  lisdexamfetamine (VYVANSE ) 30 MG capsule Take 1 capsule (30 mg total) by mouth daily before breakfast. 04/19/24 05/19/24  Lucius Krabbe, NP  lisdexamfetamine (VYVANSE ) 30 MG capsule Take 1 capsule (30 mg total) by mouth daily before breakfast. 04/19/24 05/19/24  Lucius Krabbe, NP  losartan  (COZAAR ) 25 MG tablet Take 1 tablet (25 mg total) by mouth 2 (two) times daily. 04/19/24    Lucius Krabbe, NP  metFORMIN  (GLUCOPHAGE ) 1000 MG tablet TAKE 1 TABLET BY MOUTH TWICE DAILY WITH MEALS 04/03/24   Lucius Krabbe, NP  Multiple Vitamins-Minerals (MULTI-VITE) LIQD Take 30 mLs by mouth daily.    [provider]    Family History Family History  Problem Relation Age of Onset   Diverticulitis Mother    Alcoholism Mother    Hyperlipidemia Father    Hypertension Father    Diabetes Father    Schizophrenia Father    Depression Father    Mental illness Sister    Drug abuse Sister    Colon cancer Maternal Grandmother    Diabetes  Paternal Grandmother    Sudden death Neg Hx    Heart attack Neg Hx     Social History Social History   Tobacco Use   Smoking status: Former    Current packs/day: 0.00    Types: Cigarettes    Quit date: 02/05/2006    Years since quitting: 18.2   Smokeless tobacco: Never  Vaping Use   Vaping status: Never Used  Substance Use Topics   Alcohol use: Not Currently    Comment: Occasionally   Drug use: No     Allergies   Patient has no known allergies.   Review of Systems Review of Systems  Per HPI  Physical Exam Triage Vital Signs ED Triage Vitals  Encounter Vitals Group     BP 04/25/24 1259 123/85     Girls Systolic BP Percentile --      Girls Diastolic BP Percentile --      Boys Systolic BP Percentile --      Boys Diastolic BP Percentile --      Pulse Rate 04/25/24 1259 (!) 107     Resp 04/25/24 1259 18     Temp 04/25/24 1259 98.8 F (37.1 C)     Temp Source 04/25/24 1259 Oral     SpO2 04/25/24 1259 97 %     Weight 04/25/24 1258 257 lb 8 oz (116.8 kg)     Height --      Head Circumference --      Peak Flow --      Pain Score 04/25/24 1256 9     Pain Loc --      Pain Education --      Exclude from Growth Chart --    No data found.  Updated Vital Signs BP 123/85 (BP Location: Left Arm)   Pulse (!) 107   Temp 98.8 F (37.1 C) (Oral)   Resp 18   Wt 257 lb 8 oz (116.8 kg)   LMP 04/28/2014    SpO2 97%   BMI 39.73 kg/m   Visual Acuity Right Eye Distance:   Left Eye Distance:   Bilateral Distance:    Right Eye Near:   Left Eye Near:    Bilateral Near:     Physical Exam Vitals and nursing note reviewed.  Constitutional:      Appearance: Normal appearance.  HENT:     Head: Normocephalic and atraumatic.     Right Ear: External ear normal.     Left Ear: External ear normal.     Nose: Nose normal.     Mouth/Throat:     Mouth: Mucous membranes are moist.  Eyes:     Conjunctiva/sclera: Conjunctivae normal.  Cardiovascular:     Rate and Rhythm: Normal rate.  Pulmonary:     Effort: Pulmonary effort is normal. No respiratory distress.  Musculoskeletal:        General: Swelling and tenderness present. No signs of injury.  Skin:    General: Skin is warm and dry.     Capillary Refill: Capillary refill takes less than 2 seconds.     Findings: Erythema present.  Neurological:     General: No focal deficit present.     Mental Status: She is alert.  Psychiatric:        Mood and Affect: Mood normal.     Media Information     Media Information   UC Treatments / Results  Labs (all labs ordered are listed, but only abnormal results are displayed) Labs Reviewed -  No data to display  EKG   Radiology DG Finger Middle Right Result Date: 04/25/2024 CLINICAL DATA:  finger infection EXAM: RIGHT MIDDLE FINGER 2+V COMPARISON:  None Available. FINDINGS: No acute fracture or dislocation. There is no evidence of arthropathy or other focal bone abnormality. Soft tissue swelling about the digit. No radiopaque foreign body. IMPRESSION: Soft tissue swelling about the third digit. Otherwise, no radiographic findings of osteomyelitis, at this time. Electronically Signed   By: Rogelia Myers M.D.   On: 04/25/2024 13:23    Procedures Procedures (including critical care time)  Medications Ordered in UC Medications  ketorolac  (TORADOL ) 30 MG/ML injection 30 mg (30 mg Intramuscular  Given 04/25/24 1312)    Initial Impression / Assessment and Plan / UC Course  I have reviewed the triage vital signs and the nursing notes.  Pertinent labs & imaging results that were available during my care of the patient were reviewed by me and considered in my medical decision making (see chart for details).  Vitals in triage reviewed, patient is hemodynamically stable.  Right distal index finger concerning for a pulp space infection.  Imaging by mine and radiology interpretation does not show foreign body or osteomyelitis.   Will treat with oral antibiotics, warm soaks and pain management.  IM Toradol  given in clinic.  Strict emergency precautions given if symptoms evolve or do not improve over the next 24 to 48 hours.  Patient verbalized understanding, no questions at this time.    Final Clinical Impressions(s) / UC Diagnoses   Final diagnoses:  Finger infection  Infection of pulp space of digit of hand     Discharge Instructions      Early presentation of a pulp space infection may be treated with warm soaks, rest, elevation, and oral antibiotics.  Take the Augmentin  twice daily with food for the next 7 days as prescribed.  Soak the finger in warm water with antibacterial solution like Hibiclens  or Epsom salt twice daily for 10 to 15 minutes at a time.   You can take 100 mg of ibuprofen  every 8 hours starting tomorrow.  For any breakthrough pain today you can take 500 mg of Tylenol  as needed.  The Toradol  should start working within the next 30 minutes to help with pain and inflammation.  If your symptoms do not improve over the next 48 to 72 hours, please seek further care at the nearest emergency department, as IV antibiotics may be indicated.  Hopefully we caught the infection early enough and oral antibiotics will suffice.      ED Prescriptions     Medication Sig Dispense Auth. Provider   amoxicillin -clavulanate (AUGMENTIN ) 875-125 MG tablet Take 1 tablet by mouth every  12 (twelve) hours. 14 tablet Schon Zeiders  N, FNP   ibuprofen  (ADVIL ) 800 MG tablet Take 1 tablet (800 mg total) by mouth 3 (three) times daily. 21 tablet Dreama, Jaire Pinkham  N, FNP      PDMP not reviewed this encounter.   Dreama Kraig SAILOR, FNP 04/25/24 1336

## 2024-04-25 NOTE — ED Triage Notes (Signed)
 Pt presents c/o finger pain. Pt went and got her nails done on last Thursday. She went the weekend with no issues. Pt says on Tuesday the pain started randomly. Then, pt says Wednesday the pain got worse and last night she got no sleep. Pt also c/o fever. Pt took OTC Advil  for relief.

## 2024-04-27 ENCOUNTER — Encounter (HOSPITAL_COMMUNITY): Payer: Self-pay

## 2024-04-27 ENCOUNTER — Emergency Department (HOSPITAL_COMMUNITY)
Admission: EM | Admit: 2024-04-27 | Discharge: 2024-04-27 | Disposition: A | Discharge status: Home / Self Care | Attending: Emergency Medicine | Admitting: Emergency Medicine

## 2024-04-27 ENCOUNTER — Other Ambulatory Visit: Payer: Self-pay

## 2024-04-27 DIAGNOSIS — L089 Local infection of the skin and subcutaneous tissue, unspecified: Secondary | ICD-10-CM | POA: Insufficient documentation

## 2024-04-27 DIAGNOSIS — M79644 Pain in right finger(s): Secondary | ICD-10-CM | POA: Diagnosis not present

## 2024-04-27 DIAGNOSIS — L03011 Cellulitis of right finger: Secondary | ICD-10-CM | POA: Diagnosis not present

## 2024-04-27 MED ORDER — CLINDAMYCIN HCL 150 MG PO CAPS
300.0000 mg | ORAL_CAPSULE | Freq: Once | ORAL | Status: AC
  Administered 2024-04-27: 300 mg via ORAL
  Filled 2024-04-27: qty 2

## 2024-04-27 MED ORDER — CLINDAMYCIN HCL 150 MG PO CAPS
300.0000 mg | ORAL_CAPSULE | Freq: Three times a day (TID) | ORAL | 0 refills | Status: AC
Start: 2024-04-27 — End: 2024-05-04

## 2024-04-27 MED ORDER — LIDOCAINE HCL (PF) 1 % IJ SOLN
20.0000 mL | Freq: Once | INTRAMUSCULAR | Status: AC
  Administered 2024-04-27: 20 mL
  Filled 2024-04-27: qty 20

## 2024-04-27 MED ORDER — OXYCODONE-ACETAMINOPHEN 5-325 MG PO TABS: 1.0000 | ORAL_TABLET | Freq: Three times a day (TID) | ORAL | 0 refills | Status: DC | PRN

## 2024-04-27 NOTE — ED Notes (Signed)
 RN wrapped right middle finger

## 2024-04-27 NOTE — Discharge Instructions (Addendum)
 You have a finger attention that needs close follow up with a hand specialist.  You will STOP the augmentin  and start a new antibiotic called Clindamycin  for 1 week.  You should call the surgeon's office Monday morning to confirm a follow up appointment this week - I spoke to Dr Lorretta by phone, who will see you in the office.  Rinse your hands 3 times a day and massage the area near the blister and near the top of your nailbed to continue pushing any other pus out.  You can take motrin /ibuprofen  600 mg every 6 hours for mild and moderate pain at home.  I prescribed percocet, an opioid, for more severe pain.  If your hand or forearm starts swelling, or you are having fevers or shaking chills, return to the ER.

## 2024-04-27 NOTE — ED Provider Notes (Signed)
 [place] [facility] AT [facility] Provider Note   CSN: [id number] Arrival date & time: 04/27/24  9087     Patient presents with: Wound Infection   Haley Wyatt is a 48 y.o. female right handed female here with right 3rd finger pain and swelling.  Onset 4 days ago.  Went to UC 2 days ago and was prescribed augmentin , but not getting better, now has a large blister on her finger   HPI     Prior to Admission medications   Medication Sig Start Date End Date Taking? Authorizing Provider  clindamycin  (CLEOCIN ) 150 MG capsule Take 2 capsules (300 mg total) by mouth 3 (three) times daily for 7 days. 04/27/24 05/04/24 Yes Aubra Pappalardo, Donnice PARAS, MD  oxyCODONE -acetaminophen  (PERCOCET/ROXICET) 5-325 MG tablet Take 1 tablet by mouth every 8 (eight) hours as needed for up to 12 doses for severe pain (pain score 7-10). 04/27/24  Yes Milany Geck, Donnice PARAS, MD  busPIRone  (BUSPAR ) 5 MG tablet Take 1-2 tablets (5-10 mg total) by mouth 2 (two) times daily as needed (Anxiety.). 04/19/24   Lucius Krabbe, NP  Cyanocobalamin (PHYSICIANS EZ USE B-12) 1000 MCG/ML KIT Inject 1 each as directed once a month. 02/08/24   Lucius Krabbe, NP  diclofenac  Sodium (VOLTAREN ) 1 % GEL Apply 2 g topically. 04/10/20   [provider]  ibuprofen  (ADVIL ) 800 MG tablet Take 1 tablet (800 mg total) by mouth 3 (three) times daily. 04/25/24   Dreama, [place]  N, FNP  lidocaine  (XYLOCAINE ) 2 % jelly Apply 5 mLs topically. 07/11/23   [provider]  lisdexamfetamine (VYVANSE ) 30 MG capsule Take 1 capsule (30 mg total) by mouth daily before breakfast. 04/19/24 05/19/24  Lucius Krabbe, NP  lisdexamfetamine (VYVANSE ) 30 MG capsule Take 1 capsule (30 mg total) by mouth daily before breakfast. 04/19/24 05/19/24  Lucius Krabbe, NP  lisdexamfetamine (VYVANSE ) 30 MG capsule Take 1 capsule (30 mg total) by mouth daily before breakfast. 04/19/24 05/19/24  Lucius Krabbe, NP  losartan  (COZAAR ) 25 MG  tablet Take 1 tablet (25 mg total) by mouth 2 (two) times daily. 04/19/24   Lucius Krabbe, NP  metFORMIN  (GLUCOPHAGE ) 1000 MG tablet TAKE 1 TABLET BY MOUTH TWICE DAILY WITH MEALS 04/03/24   Lucius Krabbe, NP  Multiple Vitamins-Minerals (MULTI-VITE) LIQD Take 30 mLs by mouth daily.    [provider]  naproxen  sodium (ALEVE ) 220 MG tablet Take 220 mg by mouth.    [provider]  SUTAB  [id number] MG TABS Take by mouth. 11/15/23   [provider]  Wound Cleansers (VASHE CLEANSING) SOLN Apply 1 Application topically. 07/11/23   [provider]    Allergies: Patient has no known allergies.    Review of Systems  Updated Vital Signs BP (!) 115/98   Pulse 80   Temp 98.1 F (36.7 C)   Resp 18   LMP 04/28/2014   SpO2 100%   Physical Exam Constitutional:      General: She is not in acute distress. HENT:     Head: Normocephalic and atraumatic.  Eyes:     Conjunctiva/sclera: Conjunctivae normal.     Pupils: Pupils are equal, round, and reactive to light.  Cardiovascular:     Rate and Rhythm: Normal rate and regular rhythm.  Pulmonary:     Effort: Pulmonary effort is normal. No respiratory distress.  Musculoskeletal:     Comments: Fusiform swelling of right 3rd digit, indurated blister on lateral aspect, induration at base of nailbed, pt able to flex to  70 degrees, no streaking erythema up the hand  Skin:    General: Skin is warm and dry.  Neurological:     General: No focal deficit present.     Mental Status: She is alert. Mental status is at baseline.  Psychiatric:        Mood and Affect: Mood normal.        Behavior: Behavior normal.     (all labs ordered are listed, but only abnormal results are displayed) Labs Reviewed - No data to display  EKG: None  Radiology: DG Finger Middle Right Result Date: 04/25/2024 CLINICAL DATA:  finger infection EXAM: RIGHT MIDDLE FINGER 2+V COMPARISON:  None Available. FINDINGS: No acute fracture or  dislocation. There is no evidence of arthropathy or other focal bone abnormality. Soft tissue swelling about the digit. No radiopaque foreign body. IMPRESSION: Soft tissue swelling about the third digit. Otherwise, no radiographic findings of osteomyelitis, at this time. Electronically Signed   By: Rogelia Myers M.D.   On: 04/25/2024 13:23     .Nerve Block  Date/Time: 04/27/2024 10:40 AM  Performed by: Cottie Donnice PARAS, MD Authorized by: Cottie Donnice PARAS, MD   Consent:    Consent obtained:  Verbal   Consent given by:  Patient   Risks, benefits, and alternatives were discussed: yes     Risks discussed:  Infection, intravenous injection and pain   Alternatives discussed:  No treatment Universal protocol:    Procedure explained and questions answered to patient or proxy's satisfaction: yes     Relevant documents present and verified: yes     Required blood products, implants, devices, and special equipment available: yes     Site/side marked: yes     Immediately prior to procedure, a time out was called: yes     Patient identity confirmed:  Arm band Indications:    Indications:  Procedural anesthesia Location:    Body area:  Upper extremity   Upper extremity nerve:  Metacarpal   Laterality:  Right Pre-procedure details:    Skin preparation:  Povidone-iodine   Preparation: Patient was prepped and draped in usual sterile fashion   Skin anesthesia:    Skin anesthesia method:  None Procedure details:    Block needle gauge:  24 G   Anesthetic injected:  Lidocaine  1% w/o epi   Steroid injected:  None   Additive injected:  None   Injection procedure:  Anatomic landmarks identified, anatomic landmarks palpated, incremental injection, introduced needle and negative aspiration for blood   Paresthesia:  None Post-procedure details:    Dressing:  Sterile dressing   Outcome:  Anesthesia achieved   Procedure completion:  Tolerated well, no immediate complications Irrigation and  debridement  Date/Time: 04/27/2024 10:42 AM  Performed by: Cottie Donnice PARAS, MD Authorized by: Cottie Donnice PARAS, MD  Consent: Verbal consent obtained. Written consent not obtained Risks and benefits: risks, benefits and alternatives were discussed Consent given by: patient Patient understanding: patient states understanding of the procedure being performed Patient consent: the patient's understanding of the procedure matches consent given Site marked: the operative site was marked Imaging studies: imaging studies available Required items: required blood products, implants, devices, and special equipment available Patient identity confirmed: hospital-assigned identification number Time out: Immediately prior to procedure a time out was called to verify the correct patient, procedure, equipment, support staff and site/side marked as required. Local anesthesia used: yes Anesthesia: digital block and see MAR for details  Anesthesia: Local anesthesia used: yes  Sedation: Patient sedated:  no  Patient tolerance: patient tolerated the procedure well with no immediate complications      Medications Ordered in the ED  lidocaine  (PF) (XYLOCAINE ) 1 % injection 20 mL (20 mLs Other Given 04/27/24 1004)  clindamycin  (CLEOCIN ) capsule 300 mg (300 mg Oral Given 04/27/24 1004)    Clinical Course as of 04/27/24 1110  Sat Apr 27, 2024  1039 I placed consult for hand surgery Dr Lorretta through answering service at this time [MT]    Clinical Course User Index [MT] Aleira Deiter, Donnice PARAS, MD                                 Medical Decision Making Risk Prescription drug management.   Concern for finger infection Now on 2 days of augmentin , with likely abscess formation  Digital block performed and I&D of abscess and paronychia with expression of pus Will switch to clindamycin   I don't see evidence of system infection, sepsis at this time.  I spoke to the hand surgeon Dr Lorretta who advised close  office follow up this week, he will ensure she can be seen. Agrees with antibiotic change.    Pt's husband will take her home     Final diagnoses:  Finger infection  Paronychia of finger of right hand    ED Discharge Orders          Ordered    clindamycin  (CLEOCIN ) 150 MG capsule  3 times daily        04/27/24 1108    oxyCODONE -acetaminophen  (PERCOCET/ROXICET) 5-325 MG tablet  Every 8 hours PRN        04/27/24 1109               Cottie Donnice PARAS, MD 04/27/24 1110

## 2024-04-27 NOTE — ED Triage Notes (Signed)
 Pt c.o worsening infection to her right middle finger. Pt seen at [facility] on 7/3 and given abx but was told to come back if it got worse. Finger is more swollen and blister has formed

## 2024-05-20 NOTE — Telephone Encounter (Signed)
 is vyvanse  on med list for this month? thought I sent 32m worth of RX

## 2024-05-21 MED ORDER — LISDEXAMFETAMINE DIMESYLATE 30 MG PO CAPS: 30.0000 mg | ORAL_CAPSULE | Freq: Every day | ORAL | 0 refills | Status: DC

## 2024-05-21 NOTE — Telephone Encounter (Signed)
 ok, just let me know that in the message & will save time! have sent 3 separate RX with different dates, thx

## 2024-05-24 ENCOUNTER — Other Ambulatory Visit: Payer: Self-pay

## 2024-05-24 DIAGNOSIS — E119 Type 2 diabetes mellitus without complications: Secondary | ICD-10-CM

## 2024-05-29 ENCOUNTER — Ambulatory Visit
Admission: RE | Admit: 2024-05-29 | Discharge: 2024-05-29 | Disposition: A | Discharge status: Home / Self Care | Source: Ambulatory Visit | Attending: Family | Admitting: Family

## 2024-05-29 ENCOUNTER — Ambulatory Visit

## 2024-05-29 DIAGNOSIS — Z1231 Encounter for screening mammogram for malignant neoplasm of breast: Secondary | ICD-10-CM

## 2024-06-03 NOTE — Progress Notes (Addendum)
 Chief Complaint: follow-up constipation, endoscopic procedures Primary GI Doctor:Dr. Suzann  HPI:  Patient is a  48  year old female patient with past medical history of HTN, type 2 DM, drug induced constipation, s/p gastric sleeve gastrectomy, menorrhagia, who presents for follow-up.  Patient last seen by Alan, PA on 11/13/23 for evaluation of hemorrhoids. EGD/colon scheduled with Dr. Suzann.  Interval History     Patient presents for follow-up to review her procedures and discuss issues with constipation.  I reviewed both procedures and answered all her questions and concerns.  Patient reports her reflux has not been frequent.  Patient has occasional heartburn and uses over-the-counter Tums.  Patient does not wish to be placed on any PPI therapy.  The endoscopy did show reactive gastropathy and patient informed me that she has been taking NSAIDs.  Recommended she discontinue given her history of gastric sleeve.  We also discussed the internal hemorrhoids and scheduling hemorrhoid banding which she would like to hold off for now.  Her main concern today is of issues with constipation. She has bowel movement every 1-3 days. Patient is taking 3 fiber gummies po daily. She has a lot of bloating and discomfort when she does not have a bowel movement.   Wt Readings from Last 3 Encounters:  06/04/24 249 lb 4 oz (113.1 kg)  04/25/24 257 lb 8 oz (116.8 kg)  04/19/24 257 lb 9.6 oz (116.8 kg)    Past Medical History:  Diagnosis Date   ADD (attention deficit disorder)    ADHD    Allergy    Back pain    muscle strain from exercising   Back pain    Borderline diabetes    states levels have been about 102   Chest pain    Chronic headaches    Constipation    Corneal ulceration 06/23/2021   Diabetes mellitus without complication (HCC)    Dysmenorrhea 01/28/2015   Fibroid tumor    inside uterus   GERD 06/11/2010   Qualifier: Diagnosis of  By: Curtis MD, Debby     GERD  (gastroesophageal reflux disease)    PT STATES DOES NOT HAVE IT NOW   H/O hiatal hernia    Heel pain, bilateral 06/23/2021   Hypertension    NO MEDS.. BP CONTROLLED   Hypothyroidism    HYPOTHYROIDISM 04/16/2009   Qualifier: Diagnosis of  By: Curtis MD, Thomas     Joint pain    LEG EDEMA, 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   Qualifier: Diagnosis of  By: Curtis MD, Thomas     OBESITY 04/16/2009   Other fatigue 08/18/2020   Other specified hypothyroidism 08/18/2020   Sedimentation rate elevation 06/23/2021   SOB (shortness of breath) on exertion    Swelling of both lower extremities    Urge incontinence 05/02/2022    Past Surgical History:  Procedure Laterality Date   ABDOMINAL HYSTERECTOMY     ABDOMINOPLASTY/PANNICULECTOMY WITH LIPOSUCTION  02/15/2023   surgery in [place]   BILATERAL SALPINGECTOMY Bilateral 01/28/2015   Procedure: BILATERAL SALPINGECTOMY;  Surgeon: Shanda SHAUNNA Muscat, MD;  Location: [facility];  Service: Gynecology;  Laterality: Bilateral;   CARPAL TUNNEL RELEASE Right    DILITATION & CURRETTAGE/HYSTROSCOPY WITH HYDROTHERMAL ABLATION N/A 04/16/2014   Procedure: DILATATION & CURETTAGE/HYSTEROSCOPY WITH HYDROTHERMAL ABLATION;  Surgeon: Aida DELENA Na, MD;  Location: [facility];  Service: Gynecology;  Laterality: N/A;   HIATAL HERNIA REPAIR N/A 02/11/2014   Procedure: LAPAROSCOPIC REPAIR OF HIATAL HERNIA;  Surgeon: Donnice KATHEE Lunger, MD;  Location:  [facility];  Service: General;  Laterality: N/A;   LAPAROSCOPIC GASTRIC SLEEVE RESECTION N/A 02/11/2014   Procedure: LAPAROSCOPIC GASTRIC SLEEVE RESECTION;  Surgeon: Donnice KATHEE Lunger, MD;  Location: [facility];  Service: General;  Laterality: N/A;   SHOULDER SURGERY     left shoulder loose body removal   SUPRACERVICAL ABDOMINAL HYSTERECTOMY Bilateral 01/28/2015   Procedure: TOTAL SUPER CERVICAL ABDOMINAL HYSTERECTOMY BILATERAL SALPINGECTOMY;  Surgeon: Shanda SHAUNNA Muscat, MD;  Location: [facility];  Service: Gynecology;   Laterality: Bilateral;   TUBAL LIGATION     UPPER GI ENDOSCOPY  02/11/2014   Procedure: UPPER GI ENDOSCOPY;  Surgeon: Donnice KATHEE Lunger, MD;  Location: [facility];  Service: General;;    Current Outpatient Medications  Medication Sig Dispense Refill   busPIRone  (BUSPAR ) 5 MG tablet Take 1-2 tablets (5-10 mg total) by mouth 2 (two) times daily as needed (Anxiety.). 60 tablet 2   Cyanocobalamin (PHYSICIANS EZ USE B-12) 1000 MCG/ML KIT Inject 1 each as directed once a month. 1 kit 11   lisdexamfetamine (VYVANSE ) 30 MG capsule Take 1 capsule (30 mg total) by mouth daily before breakfast. 30 capsule 0   losartan  (COZAAR ) 25 MG tablet Take 1 tablet (25 mg total) by mouth 2 (two) times daily. 180 tablet 1   metFORMIN  (GLUCOPHAGE ) 1000 MG tablet TAKE 1 TABLET BY MOUTH TWICE DAILY WITH MEALS 60 tablet 0   Multiple Vitamins-Minerals (MULTI-VITE) LIQD Take 30 mLs by mouth daily.     naproxen  sodium (ALEVE ) 220 MG tablet Take 220 mg by mouth.     oxyCODONE -acetaminophen  (PERCOCET/ROXICET) 5-325 MG tablet Take 1 tablet by mouth every 8 (eight) hours as needed for up to 12 doses for severe pain (pain score 7-10). 12 tablet 0   No current facility-administered medications for this visit.    Allergies as of 06/04/2024   (No Known Allergies)    Family History  Problem Relation Age of Onset   Diverticulitis Mother    Alcoholism Mother    Hyperlipidemia Father    Hypertension Father    Diabetes Father    Schizophrenia Father    Depression Father    Mental illness Sister    Drug abuse Sister    Colon cancer Maternal Grandmother    Diabetes Paternal Grandmother    Sudden death Neg Hx    Heart attack Neg Hx    BRCA 1/2 Neg Hx    Breast cancer Neg Hx     Review of Systems:    Constitutional: No weight loss, fever, chills, weakness or fatigue HEENT: Eyes: No change in vision               Ears, Nose, Throat:  No change in hearing or congestion Skin: No rash or itching Cardiovascular: No chest  pain, chest pressure or palpitations   Respiratory: No SOB or cough Gastrointestinal: See HPI and otherwise negative Genitourinary: No dysuria or change in urinary frequency Neurological: No headache, dizziness or syncope Musculoskeletal: No new muscle or joint pain Hematologic: No bleeding or bruising Psychiatric: No history of depression or anxiety    Physical Exam:  Vital signs: BP 112/80 (BP Location: Left Arm, Patient Position: Sitting)   Pulse 97   Ht 5' 7.72 (1.72 m) Comment: height without shoes  Wt 249 lb 4 oz (113.1 kg)   LMP 04/28/2014   BMI 38.22 kg/m   Constitutional:   Pleasant  female appears to be in NAD, Well developed, Well nourished, alert and cooperative Throat: Oral cavity and pharynx  without inflammation, swelling or lesion.  Respiratory: Respirations even and unlabored. Lungs clear to auscultation bilaterally.   No wheezes, crackles, or rhonchi.  Cardiovascular: Normal S1, S2. Regular rate and rhythm. No peripheral edema, cyanosis or pallor.  Gastrointestinal:  Soft, nondistended, nontender. No rebound or guarding. Normal bowel sounds. No appreciable masses or hepatomegaly. Patient has waist binder on. Rectal:  Not performed.  Msk:  Symmetrical without gross deformities. Without edema, no deformity or joint abnormality.  Neurologic:  Alert and  oriented x4;  grossly normal neurologically.  Skin:   Dry and intact without significant lesions or rashes.  RELEVANT LABS AND IMAGING: CBC    Latest Ref Rng & Units 04/19/2024    3:18 PM 11/13/2023    4:07 PM 01/11/2023    9:47 AM  CBC  WBC 3.8 - 10.8 Thousand/uL 8.2  7.5  8.0   Hemoglobin 11.7 - 15.5 g/dL 87.3  86.3  86.3   Hematocrit 35.0 - 45.0 % 40.4  42.6  42.2   Platelets 140 - 400 Thousand/uL 228  280.0  221.0      CMP     Latest Ref Rng & Units 04/19/2024    3:18 PM 02/06/2024    3:21 PM 11/13/2023    4:07 PM  CMP  Glucose 65 - 99 mg/dL 84   75   BUN 7 - 25 mg/dL 18   13   Creatinine 9.49 - 0.99  mg/dL 9.20   9.32   Sodium 864 - 146 mmol/L 139   137   Potassium 3.5 - 5.3 mmol/L 4.1   4.0   Chloride 98 - 110 mmol/L 105   101   CO2 20 - 32 mmol/L 26   27   Calcium 8.6 - 10.2 mg/dL 9.1   9.4   Total Protein 6.1 - 8.1 g/dL 6.7  7.6  8.0   Total Bilirubin 0.2 - 1.2 mg/dL 0.2  0.3  0.3   Alkaline Phos 39 - 117 U/L  75  61   AST 10 - 35 U/L 14  12  12    ALT 6 - 29 U/L 16  9  10       Lab Results  Component Value Date   TSH 1.67 04/19/2024   RELEVANT GI HISTORY, LABS, IMAGING: Labs: 11/13/23 labs show: Hep c non reactive, Hep B S AB reactive, Hep B Surface Ag non reactive,Hep A AB, total reactive, AMA <20, ASMA 40, ANA positive, ANA titer 1:40, IgG 1587, iron 62, ferritin 76.3, normal BMP, normal hepatic function, PLT 280,  Hgb 13.6 02/06/24 labs show: ASMA  <20, normal hepatic function  Imaging: 03/08/2023 CT AB with contrast IMPRESSION: 1. Postsurgical changes compatible with recent abdominoplasty. 2. Diffuse edema throughout the abdominal wall with subcutaneous stranding and scattered deep subcutaneous air pockets. Findings could be simply due to the recent surgery or could indicate infectious complication such as cellulitis. 3. 8.5 x 2.1 x 1.8 cm hematoma in the midline abdominal wall infolding between the rectus abdominis muscles. 4. Complex fluid collections in the lateral lower chest wall bilaterally, 8 x 3.3 x 7.7 cm on the right and 4.6 x 3.2 x 4.1 cm on the left. These could be hematomas, seromas, or abscesses. 5. Constipation and diverticulosis. 6. Hepatomegaly with mild steatosis. 7. Hiatal hernia with evidence of prior hiatal hernia repair. 8. Disproportionate facet hypertrophy at L4-5 with grade 1 L4-5  GI procedures: 12/12/23 Colonoscopy/EGD Colonoscopy - The entire examined colon is normal. - The examined portion of  the ileum was normal. - Hemorrhoids. Anal papilla were hypertrophied. Hemorrhoids Clois Treanor explain recent positive FIT test. - No specimens  collected. EGD - Normal esophagus. - A sleeve gastrectomy was found. There was acute angulation stomach in the gastric body requiring maneuvering of the endoscope into the antrum. - Gastritis. Biopsied. - Small hiatal hernia in en face view. Retroflexion was unable to be performed due to the angulation of the stomach. - Normal duodenal bulb and second portion of the duodenum. Path: 1. Surgical [P], gastric antrum and body :       REACTIVE GASTROPATHY WITH MINIMAL CHRONIC GASTRITIS       NEGATIVE FOR H. PYLORI, INTESTINAL METAPLASIA, DYSPLASIA AND CARCINOMA   Fibrosis 4 Score = .77 (Low risk)        Interpretation for patients with NAFLD          <1.30       -  F0-F1 (Low risk)          1.30-2.67 -  Indeterminate           >2.67      -  F3-F4 (High risk)     Validated for ages 6-65  Assessment: Encounter Diagnoses  Name Primary?   Chronic idiopathic constipation Yes   Gastroesophageal reflux disease, unspecified whether esophagitis present    H/O gastric sleeve     48 year old female patient with chronic constipation not relieved with high-fiber diet and fiber supplementation.  We discussed conservative therapy options such as smooth move tea or magnesium  oxide 400 to 500 mg 1 tablet once to 3 times per day.  Provided samples of pro secretory agent Linzess, as patient states she has tried in past and worked well. She can contact office if she would like prescription. Also recommended hemorrhoid banding for hemorrhoids, patient would like to hold off for now.  Up-to-date on colonoscopy screening can have yearly FIT versus colonoscopy in 10 years.    Patient reports her heartburn is controlled and does not wish to be on any daily PPI therapy.  Reinforced strict GERD diet and weight loss.  Also recommended patient avoid NSAIDs with history of gastric surgery as this increases the risk of gastric ulcers.    Lastly we followed up on patient's fatty liver and most recent lab work revealed Fib4  score of low risk.  We spent several minutes discussing dietary changes, weight loss, and tracking her daily calories.  Also encourage patient to engage in physical exercise as tolerated.  Patient plans to make a plan.  She reports she was on Mounjaro  in the past however it was expensive, unsure if other GLP-1's are covered.  We will follow-up with hepatic function and CBC December.  Plan: - Monitor Hepatic function,CBC every 6 mths (09/2024) -No NSAIDs -samples Linzess 72 mcg po daily provided - Offered hemorrhoid banding, declined -Offered PPI therapy, declined -Follow-up 6 mths with APP or Dr. Suzann  Thank you for the courtesy of this consult. Please call me with any questions or concerns.   Romano Stigger, FNP-C [facility] 06/04/2024, 11:27 AM  Cc: Lucius Krabbe, NP  I have reviewed the clinic note as outlined by Cathryne Beal, NP and agree with the assessment, plan and medical decision making.  Vanecia returns to the office for follow-up of GERD and chronic constipation.  EGD was reassuring-normal gastric sleeve with reactive gastropathy on biopsies.  She prefers to avoid PPI and will continue with dietary modification.  Colonoscopy was normal.  Agree that she can  consider stool based testing versus colonoscopy in the future.  Amira Podolak use options outlined in the note for management of chronic constipation and consider prescription laxatives if needed.  Inocente Hausen, MD

## 2024-06-04 ENCOUNTER — Ambulatory Visit (INDEPENDENT_AMBULATORY_CARE_PROVIDER_SITE_OTHER): Admitting: Gastroenterology

## 2024-06-04 ENCOUNTER — Encounter: Payer: Self-pay | Admitting: Gastroenterology

## 2024-06-04 VITALS — BP 112/80 | HR 97 | Ht 67.72 in | Wt 249.2 lb

## 2024-06-04 DIAGNOSIS — K5904 Chronic idiopathic constipation: Secondary | ICD-10-CM | POA: Diagnosis not present

## 2024-06-04 DIAGNOSIS — K219 Gastro-esophageal reflux disease without esophagitis: Secondary | ICD-10-CM | POA: Diagnosis not present

## 2024-06-04 DIAGNOSIS — Z903 Acquired absence of stomach [part of]: Secondary | ICD-10-CM | POA: Diagnosis not present

## 2024-06-04 NOTE — Patient Instructions (Addendum)
 Constipation Natural remedies  Smooth move tea Prune juice Magnesium  oxide 400-500 mg po daily- has to be taken daily  Sample sof Linzess 72mcg - take 1 tablet 30-45 minutes before first meal of day with full glass of water  _______________________________________________________  If your blood pressure at your visit was 140/90 or greater, please contact your primary care physician to follow up on this.  _______________________________________________________  If you are age 50 or older, your body mass index should be between 23-30. Your Body mass index is 38.22 kg/m. If this is out of the aforementioned range listed, please consider follow up with your Primary Care Provider.  If you are age 46 or younger, your body mass index should be between 19-25. Your Body mass index is 38.22 kg/m. If this is out of the aformentioned range listed, please consider follow up with your Primary Care Provider.   ________________________________________________________  The [facility] providers would like to encourage you to use MYCHART to communicate with providers for non-urgent requests or questions.  Due to long hold times on the telephone, sending your provider a message by [facility] may be a faster and more efficient way to get a response.  Please allow 48 business hours for a response.  Please remember that this is for non-urgent requests.  _______________________________________________________  [facility] is using a team-based approach to care.  Your team is made up of your doctor and two to three APPS. Our APPS (Nurse Practitioners and Physician Assistants) work with your physician to ensure care continuity for you. They are fully qualified to address your health concerns and develop a treatment plan. They communicate directly with your gastroenterologist to care for you. Seeing the Advanced Practice Practitioners on your physician's team can help you by facilitating care more promptly,  often allowing for earlier appointments, access to diagnostic testing, procedures, and other specialty referrals.

## 2024-06-04 NOTE — Patient Instructions (Addendum)
 Constipation Natural remedies  Smooth move tea Prune juice Magnesium  oxide 400-500 mg po daily- has to be taken daily  Sample sof Linzess 72mcg - take 1 tablet 30-45 minutes before first meal of day with full glass of water  _______________________________________________________  If your blood pressure at your visit was 140/90 or greater, please contact your primary care physician to follow up on this.  _______________________________________________________  If you are age 50 or older, your body mass index should be between 23-30. Your Body mass index is 38.22 kg/m. If this is out of the aforementioned range listed, please consider follow up with your Primary Care Provider.  If you are age 69 or younger, your body mass index should be between 19-25. Your Body mass index is 38.22 kg/m. If this is out of the aformentioned range listed, please consider follow up with your Primary Care Provider.   ________________________________________________________  The [facility] providers would like to encourage you to use MYCHART to communicate with providers for non-urgent requests or questions.  Due to long hold times on the telephone, sending your provider a message by [facility] may be a faster and more efficient way to get a response.  Please allow 48 business hours for a response.  Please remember that this is for non-urgent requests.  _______________________________________________________  [facility] is using a team-based approach to care.  Your team is made up of your doctor and two to three APPS. Our APPS (Nurse Practitioners and Physician Assistants) work with your physician to ensure care continuity for you. They are fully qualified to address your health concerns and develop a treatment plan. They communicate directly with your gastroenterologist to care for you. Seeing the Advanced Practice Practitioners on your physician's team can help you by facilitating care more promptly,  often allowing for earlier appointments, access to diagnostic testing, procedures, and other specialty referrals.

## 2024-06-28 ENCOUNTER — Other Ambulatory Visit: Payer: Self-pay | Admitting: Family

## 2024-06-28 DIAGNOSIS — F900 Attention-deficit hyperactivity disorder, predominantly inattentive type: Secondary | ICD-10-CM

## 2024-06-28 NOTE — Telephone Encounter (Signed)
 Copied from CRM [id number]. Topic: Clinical - Medication Refill >> Jun 28, 2024  2:47 PM [place] E wrote: Medication: lisdexamfetamine (VYVANSE ) 30 MG capsule  Has the patient contacted their pharmacy? No (Agent: If no, request that the patient contact the pharmacy for the refill. If patient does not wish to contact the pharmacy document the reason why and proceed with request.) (Agent: If yes, when and what did the pharmacy advise?)  This is the patient's preferred pharmacy:  [facility] [address] [place], [place] - [address] [place] [address] Phone: [phone] Fax: [phone]   Is this the correct pharmacy for this prescription? Yes If no, delete pharmacy and type the correct one.   Has the prescription been filled recently? No  Is the patient out of the medication? Yes  Has the patient been seen for an appointment in the last year OR does the patient have an upcoming appointment? Yes  Can we respond through MyChart? Yes  Agent: Please be advised that Rx refills may take up to 3 business days. We ask that you follow-up with your pharmacy.

## 2024-06-30 MED ORDER — LISDEXAMFETAMINE DIMESYLATE 30 MG PO CAPS: 30.0000 mg | ORAL_CAPSULE | Freq: Every day | ORAL | 0 refills | Status: AC

## 2024-07-04 ENCOUNTER — Other Ambulatory Visit: Payer: Self-pay | Admitting: Family

## 2024-07-04 DIAGNOSIS — E1169 Type 2 diabetes mellitus with other specified complication: Secondary | ICD-10-CM

## 2024-07-19 ENCOUNTER — Telehealth: Admitting: Family

## 2024-07-19 DIAGNOSIS — F418 Other specified anxiety disorders: Secondary | ICD-10-CM | POA: Diagnosis not present

## 2024-07-19 DIAGNOSIS — R7303 Prediabetes: Secondary | ICD-10-CM

## 2024-07-19 DIAGNOSIS — F900 Attention-deficit hyperactivity disorder, predominantly inattentive type: Secondary | ICD-10-CM

## 2024-07-19 MED ORDER — LISDEXAMFETAMINE DIMESYLATE 20 MG PO CAPS: 20.0000 mg | ORAL_CAPSULE | Freq: Every day | ORAL | 0 refills | Status: DC

## 2024-07-19 NOTE — Progress Notes (Signed)
 MyChart Video Visit    Virtual Visit via Video Note   This format is felt to be most appropriate for this patient at this time. Physical exam was limited by quality of the video and audio technology used for the visit. CMA was able to get the patient set up on a video visit.  Patient location: Home. Patient and provider in visit Provider location: Office  I discussed the limitations of evaluation and management by telemedicine and the availability of in person appointments. The patient expressed understanding and agreed to proceed.  Visit Date: 07/19/2024  Today's healthcare provider: Lucius Krabbe, NP     Subjective:   Patient ID: Haley Wyatt, female    DOB: 12-Mar-1976, 48 y.o.   MRN: [id number]  Chief Complaint  Patient presents with   ADHD  Discussed the use of AI scribe software for clinical note transcription with the patient, who gave verbal consent to proceed.  History of Present Illness   Haley Wyatt is a 48 year old female who presents for a follow-up regarding her medication management for ADHD.  She is taking Vyvanse  30 mg for ADHD and she feels like it is raising her blood pressure. She feels a little more anxious. When she stopped taking the medication for a couple of days she says she felt better. She is wanting to know what her options are.  She monitors her blood pressure at home. She  has buspirone  daily for anxiety, but has not taken lately, does not remember significant side effects.     Assessment and Plan    Attention-deficit hyperactivity disorder (ADHD) ADHD managed with Vyvanse . Side effects possibly due to increased blood pressure. Discussed bupropion as alternative, but she prefers reducing Vyvanse  dose. - Reduce Vyvanse  dose to 20 mg daily, sending 30d RX for 3 months to [facility]. - Call office after 30 days if no improvement in symptoms - F/U in office in 3 months.  Anxiety disorder Anxiety managed with buspirone  5mg  bid. No side  effects reported. - Advised to restart buspirone  daily as directed to help with current symptoms. - F/U in 3 mos or sooner if needed  Borderline T2DM w/obesity Last A1C 5.8 in 03/2024 due to taking Mounjaro  and losing some weight. She has not taken Mounjaro  for months as she was paying out of pocket and became too expensive. - Can recheck A1C  - Continue to advise on healthy diet and exercise.      Past Medical History:  Diagnosis Date   ADD (attention deficit disorder)    ADHD    Allergy    Back pain    muscle strain from exercising   Back pain    Borderline diabetes    states levels have been about 102   Chest pain    Chronic headaches    Constipation    Corneal ulceration 06/23/2021   Diabetes mellitus without complication (HCC)    Dysmenorrhea 01/28/2015   Fibroid tumor    inside uterus   GERD 06/11/2010   Qualifier: Diagnosis of  By: Curtis MD, Debby     GERD (gastroesophageal reflux disease)    PT STATES DOES NOT HAVE IT NOW   H/O hiatal hernia    Heel pain, bilateral 06/23/2021   Hypertension    NO MEDS.. BP CONTROLLED   Hypothyroidism    HYPOTHYROIDISM 04/16/2009   Qualifier: Diagnosis of  By: Curtis MD, Thomas     Joint pain    LEG EDEMA, 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  Qualifier: Diagnosis of  By: Curtis MD, Thomas     OBESITY 04/16/2009   Other fatigue 08/18/2020   Other specified hypothyroidism 08/18/2020   Sedimentation rate elevation 06/23/2021   SOB (shortness of breath) on exertion    Swelling of both lower extremities    Urge incontinence 05/02/2022    Past Surgical History:  Procedure Laterality Date   ABDOMINAL HYSTERECTOMY     ABDOMINOPLASTY/PANNICULECTOMY WITH LIPOSUCTION  02/15/2023   surgery in [place]   BILATERAL SALPINGECTOMY Bilateral 01/28/2015   Procedure: BILATERAL SALPINGECTOMY;  Surgeon: Shanda SHAUNNA Muscat, MD;  Location: [facility];  Service: Gynecology;  Laterality: Bilateral;   CARPAL TUNNEL RELEASE Right     DILITATION & CURRETTAGE/HYSTROSCOPY WITH HYDROTHERMAL ABLATION N/A 04/16/2014   Procedure: DILATATION & CURETTAGE/HYSTEROSCOPY WITH HYDROTHERMAL ABLATION;  Surgeon: Aida DELENA Na, MD;  Location: [facility];  Service: Gynecology;  Laterality: N/A;   HIATAL HERNIA REPAIR N/A 02/11/2014   Procedure: LAPAROSCOPIC REPAIR OF HIATAL HERNIA;  Surgeon: Donnice KATHEE Lunger, MD;  Location: [facility];  Service: General;  Laterality: N/A;   LAPAROSCOPIC GASTRIC SLEEVE RESECTION N/A 02/11/2014   Procedure: LAPAROSCOPIC GASTRIC SLEEVE RESECTION;  Surgeon: Donnice KATHEE Lunger, MD;  Location: [facility];  Service: General;  Laterality: N/A;   SHOULDER SURGERY     left shoulder loose body removal   SUPRACERVICAL ABDOMINAL HYSTERECTOMY Bilateral 01/28/2015   Procedure: TOTAL SUPER CERVICAL ABDOMINAL HYSTERECTOMY BILATERAL SALPINGECTOMY;  Surgeon: Shanda SHAUNNA Muscat, MD;  Location: [facility];  Service: Gynecology;  Laterality: Bilateral;   TUBAL LIGATION     UPPER GI ENDOSCOPY  02/11/2014   Procedure: UPPER GI ENDOSCOPY;  Surgeon: Donnice KATHEE Lunger, MD;  Location: [facility];  Service: General;;    Outpatient Medications Prior to Visit  Medication Sig Dispense Refill   busPIRone  (BUSPAR ) 5 MG tablet Take 1-2 tablets (5-10 mg total) by mouth 2 (two) times daily as needed (Anxiety.). 60 tablet 2   Cyanocobalamin (PHYSICIANS EZ USE B-12) 1000 MCG/ML KIT Inject 1 each as directed once a month. 1 kit 11   lisdexamfetamine (VYVANSE ) 30 MG capsule Take 1 capsule (30 mg total) by mouth daily before breakfast. 30 capsule 0   losartan  (COZAAR ) 25 MG tablet Take 1 tablet (25 mg total) by mouth 2 (two) times daily. 180 tablet 1   metFORMIN  (GLUCOPHAGE ) 1000 MG tablet TAKE 1 TABLET BY MOUTH TWICE DAILY WITH  MEAL 60 tablet 0   Multiple Vitamins-Minerals (MULTI-VITE) LIQD Take 30 mLs by mouth daily.     naproxen  sodium (ALEVE ) 220 MG tablet Take 220 mg by mouth.     oxyCODONE -acetaminophen  (PERCOCET/ROXICET) 5-325 MG tablet Take 1 tablet by  mouth every 8 (eight) hours as needed for up to 12 doses for severe pain (pain score 7-10). 12 tablet 0   No facility-administered medications prior to visit.   No Known Allergies    Objective:   Physical Exam Vitals and nursing note reviewed.  Constitutional:      General: Pt is not in acute distress.    Appearance: Normal appearance.  HENT:     Head: Normocephalic.  Pulmonary:     Effort: No respiratory distress.  Musculoskeletal:     Cervical back: Normal range of motion.  Skin:    General: Skin is dry.     Coloration: Skin is not pale.  Neurological:     Mental Status: Pt is alert and oriented to person, place, and time.  Psychiatric:        Mood and Affect:  Mood normal.   LMP 04/28/2014   Wt Readings from Last 3 Encounters:  06/04/24 249 lb 4 oz (113.1 kg)  04/25/24 257 lb 8 oz (116.8 kg)  04/19/24 257 lb 9.6 oz (116.8 kg)      I discussed the assessment and treatment plan with the patient. The patient was provided an opportunity to ask questions and all were answered. The patient agreed with the plan and demonstrated an understanding of the instructions.   The patient was advised to call back or seek an in-person evaluation if the symptoms worsen or if the condition fails to improve as anticipated.  Lucius Krabbe, NP [facility] [phone] (phone) [phone] (fax)  [facility]

## 2024-07-19 NOTE — Assessment & Plan Note (Signed)
 ADHD managed with Vyvanse . Side effects possibly due to increased blood pressure. Discussed bupropion as alternative, but she prefers reducing Vyvanse  dose. - Reduce Vyvanse  dose to 20 mg daily, sending 30d RX for 3 months to [facility]. - Call office after 30 days if no improvement in symptoms - F/U in office in 3 months.

## 2024-07-19 NOTE — Assessment & Plan Note (Signed)
 chronic last A1C here was 6.3 no longer can afford Mounjaro  weight back up taking 500mg  Metformin  bid   continue to advise on low carb diet, restart exercise regimen f/u in 3-6 mo

## 2024-07-19 NOTE — Assessment & Plan Note (Signed)
 Anxiety managed with buspirone  5mg  bid. No side effects reported. - Advised to restart buspirone  daily as directed to help with current symptoms. - F/U in 3 mos or sooner if needed

## 2024-07-19 NOTE — Assessment & Plan Note (Signed)
 chronic last A1C here was 5.8 (on Mounjaro ) 03/2024 highest A1C 6.3 in EMR no longer can afford Mounjaro  weight back up taking 500mg  Metformin  bid   continue to advise on low carb diet, restart exercise regimen f/u 6 mo

## 2024-08-23 DIAGNOSIS — Z008 Encounter for other general examination: Secondary | ICD-10-CM | POA: Diagnosis not present

## 2024-09-25 ENCOUNTER — Ambulatory Visit: Admission: EM | Admit: 2024-09-25 | Discharge: 2024-09-25 | Disposition: A | Discharge status: Home / Self Care

## 2024-09-25 ENCOUNTER — Ambulatory Visit: Payer: Self-pay

## 2024-09-25 DIAGNOSIS — H6991 Unspecified Eustachian tube disorder, right ear: Secondary | ICD-10-CM

## 2024-09-25 DIAGNOSIS — J069 Acute upper respiratory infection, unspecified: Secondary | ICD-10-CM | POA: Diagnosis not present

## 2024-09-25 LAB — POC COVID19/FLU A&B COMBO
Covid Antigen, POC: NEGATIVE
Influenza A Antigen, POC: NEGATIVE
Influenza B Antigen, POC: NEGATIVE

## 2024-09-25 MED ORDER — FLUTICASONE PROPIONATE 50 MCG/ACT NA SUSP: 1.0000 | Freq: Every day | NASAL | 0 refills | Status: AC

## 2024-09-25 MED ORDER — PREDNISONE 50 MG PO TABS: ORAL_TABLET | ORAL | 0 refills | Status: DC

## 2024-09-25 MED ORDER — PSEUDOEPHEDRINE HCL 30 MG PO TABS: 30.0000 mg | ORAL_TABLET | ORAL | 0 refills | Status: DC | PRN

## 2024-09-25 NOTE — Telephone Encounter (Signed)
 FYI Only or Action Required?: FYI only for provider: UC advised.  Patient was last seen in primary care on 07/19/2024 by Lucius Krabbe, NP.  Called Nurse Triage reporting Sinusitis.  Symptoms began several days ago.  Interventions attempted: Nothing.  Symptoms are: stable.  Triage Disposition: See Physician Within 24 Hours  Patient/caregiver understands and will follow disposition?: Yes Reason for Disposition  Earache  Answer Assessment - Initial Assessment Questions Taken coccidian. Patient states she feels so dizzy she does not want to drive, she's being cautious when standing because she is dizzy. Patient takes Losartan  25mg  2x daily. Denies chest pain or SOB. Patient stated she has not checked BP and is unable to locate a BP monitor. Patient looking to be seen today, no available appointments. Advised UC, patient agreeable  1. LOCATION: Where does it hurt?      Sinus area, right ear  2. ONSET: When did the sinus pain start?  (e.g., hours, days)      2 days ago   3. SEVERITY: How bad is the pain?   (Scale 0-10; or none, mild, moderate or severe)     More like pressure, not pain  5. NASAL CONGESTION: Is the nose blocked? If Yes, ask: Can you open it or must you breathe through your mouth?     Denies  6. NASAL DISCHARGE: Do you have discharge from your nose? If so ask, What color?     Slightly runny  7. FEVER: Do you have a fever? If Yes, ask: What is it, how was it measured, and when did it start?      Denies  8. OTHER SYMPTOMS: Do you have any other symptoms? (e.g., sore throat, cough, earache, difficulty breathing)     Right ear pressure and some pain, dizzy, blurred vision but does not have glasses on currently so that's normal  Protocols used: Sinus Pain or Congestion-A-AH  Copied from CRM #[id number]. Topic: Clinical - Red Word Triage >> Sep 25, 2024 11:11 AM Eva FALCON wrote: Red Word that prompted transfer to Nurse Triage: thinks a possible  sinus infection. pressure and pain in right ear/ pressure on face, dizziness,

## 2024-09-25 NOTE — Discharge Instructions (Addendum)
 Monitor BP more closely as sudafed can increase it.   You been diagnosed with a viral illness today. -Viruses have to run their course and medicines that are prescribed are meant to help with symptoms. - With viruses usually feel poorly from 3 to 7 days with cough being the last symptoms to resolve.  -Cough can linger from days to weeks.  Antibiotics are not effective for viruses. -If your cough lasts more than 2 weeks and you are coughing so hard that you are vomiting or feel like you could pass out we need to follow-up with PCP for further testing and evaluation. -Rest, increase water intake, may use pseudoephedrine  for nasal congestion, Delsym (dextromethorphan) or honey as needed for cough, and ibuprofen  and/or Tylenol  as directed on packaging for pain and fever. -If you have hypertension you should take Coricidin or other OTC meds approved for people with high blood pressure. -You may use a spoonful of honey every 4-6 hours as needed for throat pain and cough. -Warm tea with honey and lemon are helpful for soothe throat as well.  Chloraseptic and Cepacol make a throat lozenge with numbing medication, can be purchased over-the-counter. -May also use Flonase  or sinus rinse for sinus pressure or nasal congestion.  Be sure to use distilled bottled water for sinus rinses. -May use coolmist humidifier to open up nasal passages -May elevate head to assist with postnasal drainage. -If you feel poorly (fever, fatigue, shortness of breath, nausea, etc.) for more than 10 days to be sure to follow-up with PCP or in clinic for further evaluation and additional treatments. If you experience chest pain with shortness of breath or pulse oxygen less than 95% you should report to the ER.

## 2024-09-25 NOTE — ED Triage Notes (Addendum)
 Pt states she is feeling dizzy,fatigued ,pressure to her face, and a runny nose since yesterday. 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

## 2024-09-25 NOTE — ED Provider Notes (Signed)
 [name]    CSN: [id number] Arrival date & time: 09/25/24  1452      History   Chief Complaint Chief Complaint  Patient presents with   Dizziness    HPI Haley Wyatt is a 48 y.o. female.   Pt presents today due to dizziness, fatigue, sinus pressure, and nasal drainage since yesterday. Pt states that one of her coworkers tested positive for flu and she would like to be tested as well. Pt denies fever, chills, nausea, or vomiting.   The history is provided by the patient.  Dizziness   Past Medical History:  Diagnosis Date   ADD (attention deficit disorder)    ADHD    Allergy    Back pain    muscle strain from exercising   Back pain    Borderline diabetes    states levels have been about 102   Chest pain    Chronic headaches    Constipation    Corneal ulceration 06/23/2021   Diabetes mellitus without complication (HCC)    Dysmenorrhea 01/28/2015   Fibroid tumor    inside uterus   GERD 06/11/2010   Qualifier: Diagnosis of  By: Curtis MD, Debby     GERD (gastroesophageal reflux disease)    PT STATES DOES NOT HAVE IT NOW   H/O hiatal hernia    Heel pain, bilateral 06/23/2021   Hypertension    NO MEDS.. BP CONTROLLED   Hypothyroidism    HYPOTHYROIDISM 04/16/2009   Qualifier: Diagnosis of  By: Curtis MD, Thomas     Joint pain    LEG EDEMA, 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   Qualifier: Diagnosis of  By: Curtis MD, Thomas     OBESITY 04/16/2009   Other fatigue 08/18/2020   Other specified hypothyroidism 08/18/2020   Sedimentation rate elevation 06/23/2021   SOB (shortness of breath) on exertion    Swelling of both lower extremities    Urge incontinence 05/02/2022    Patient Active Problem List   Diagnosis Date Noted   Drug-induced constipation 05/22/2023   Situational anxiety 03/30/2023   Borderline diabetes 06/15/2022   Uterine leiomyoma 05/02/2022   Menorrhagia 05/02/2022   Attention deficit disorder 05/02/2022    Essential hypertension 08/18/2020   At risk for heart disease 08/18/2020   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   Morbid obesity (HCC) 07/16/2013   CHONDROMALACIA PATELLA, 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    Past Surgical History:  Procedure Laterality Date   ABDOMINAL HYSTERECTOMY     ABDOMINOPLASTY/PANNICULECTOMY WITH LIPOSUCTION  02/15/2023   surgery in [place]   BILATERAL SALPINGECTOMY Bilateral 01/28/2015   Procedure: BILATERAL SALPINGECTOMY;  Surgeon: Shanda SHAUNNA Muscat, MD;  Location: [facility];  Service: Gynecology;  Laterality: Bilateral;   CARPAL TUNNEL RELEASE Right    DILITATION & CURRETTAGE/HYSTROSCOPY WITH HYDROTHERMAL ABLATION N/A 04/16/2014   Procedure: DILATATION & CURETTAGE/HYSTEROSCOPY WITH HYDROTHERMAL ABLATION;  Surgeon: Aida DELENA Na, MD;  Location: [facility];  Service: Gynecology;  Laterality: N/A;   HIATAL HERNIA REPAIR N/A 02/11/2014   Procedure: LAPAROSCOPIC REPAIR OF HIATAL HERNIA;  Surgeon: Donnice KATHEE Lunger, MD;  Location: [facility];  Service: General;  Laterality: N/A;   LAPAROSCOPIC GASTRIC SLEEVE RESECTION N/A 02/11/2014   Procedure: LAPAROSCOPIC GASTRIC SLEEVE RESECTION;  Surgeon: Donnice KATHEE Lunger, MD;  Location: [facility];  Service: General;  Laterality: N/A;   SHOULDER SURGERY     left shoulder loose body removal   SUPRACERVICAL ABDOMINAL HYSTERECTOMY Bilateral 01/28/2015   Procedure: TOTAL SUPER CERVICAL ABDOMINAL HYSTERECTOMY BILATERAL SALPINGECTOMY;  Surgeon: Shanda P  Haygood, MD;  Location: [facility];  Service: Gynecology;  Laterality: Bilateral;   TUBAL LIGATION     UPPER GI ENDOSCOPY  02/11/2014   Procedure: UPPER GI ENDOSCOPY;  Surgeon: Donnice KATHEE Lunger, MD;  Location: [facility];  Service: General;;    OB History     Gravida  4   Para  4   Term      Preterm      AB      Living  4      SAB      IAB      Ectopic      Multiple      Live Births               Home Medications    Prior to Admission medications    Medication Sig Start Date End Date Taking? Authorizing Provider  fluticasone  (FLONASE ) 50 MCG/ACT nasal spray Place 1 spray into both nostrils daily. 09/25/24  Yes Andra Corean BROCKS, PA-C  predniSONE  (DELTASONE ) 50 MG tablet Take 1 tab po daily as needed 09/25/24  Yes Andra Corean C, PA-C  pseudoephedrine  (SUDAFED) 30 MG tablet Take 1 tablet (30 mg total) by mouth every 4 (four) hours as needed for congestion. 09/25/24  Yes Andra Corean C, PA-C  busPIRone  (BUSPAR ) 5 MG tablet Take 1-2 tablets (5-10 mg total) by mouth 2 (two) times daily as needed (Anxiety.). 04/19/24   Lucius Corean, NP  Cyanocobalamin (PHYSICIANS EZ USE B-12) 1000 MCG/ML KIT Inject 1 each as directed once a month. 02/08/24   Lucius Corean, NP  lisdexamfetamine (VYVANSE ) 20 MG capsule Take 1 capsule (20 mg total) by mouth daily before breakfast. 08/02/24 09/01/24  Lucius Corean, NP  lisdexamfetamine (VYVANSE ) 20 MG capsule Take 1 capsule (20 mg total) by mouth daily before breakfast. 09/02/24 10/02/24  Lucius Corean, NP  lisdexamfetamine (VYVANSE ) 20 MG capsule Take 1 capsule (20 mg total) by mouth daily before breakfast. 10/02/24 11/01/24  Lucius Corean, NP  lisdexamfetamine (VYVANSE ) 30 MG capsule Take 1 capsule (30 mg total) by mouth daily before breakfast. 06/30/24 07/30/24  Lucius Corean, NP  losartan  (COZAAR ) 25 MG tablet Take 1 tablet (25 mg total) by mouth 2 (two) times daily. 04/19/24   Lucius Corean, NP  metFORMIN  (GLUCOPHAGE ) 1000 MG tablet TAKE 1 TABLET BY MOUTH TWICE DAILY WITH  MEAL 07/05/24   Lucius Corean, NP  Multiple Vitamins-Minerals (MULTI-VITE) LIQD Take 30 mLs by mouth daily.    [provider]    Family History Family History  Problem Relation Age of Onset   Diverticulitis Mother    Alcoholism Mother    Hyperlipidemia Father    Hypertension Father    Diabetes Father    Schizophrenia Father    Depression Father    Mental illness Sister    Drug  abuse Sister    Colon cancer Maternal Grandmother    Diabetes Paternal Grandmother    Sudden death Neg Hx    Heart attack Neg Hx    BRCA 1/2 Neg Hx    Breast cancer Neg Hx     Social History Social History   Tobacco Use   Smoking status: Former    Current packs/day: 0.00    Types: Cigarettes    Quit date: 02/05/2006    Years since quitting: 18.6   Smokeless tobacco: Never  Vaping Use   Vaping status: Never Used  Substance Use Topics   Alcohol use: Not Currently    Comment: Occasionally   Drug use:  No     Allergies   Patient has no known allergies.   Review of Systems Review of Systems  Neurological:  Positive for dizziness.     Physical Exam Triage Vital Signs ED Triage Vitals  Encounter Vitals Group     BP 09/25/24 1549 127/71     Girls Systolic BP Percentile --      Girls Diastolic BP Percentile --      Boys Systolic BP Percentile --      Boys Diastolic BP Percentile --      Pulse Rate 09/25/24 1549 71     Resp 09/25/24 1549 16     Temp 09/25/24 1549 98.6 F (37 C)     Temp Source 09/25/24 1549 Oral     SpO2 09/25/24 1549 95 %     Weight --      Height --      Head Circumference --      Peak Flow --      Pain Score 09/25/24 1548 0     Pain Loc --      Pain Education --      Exclude from Growth Chart --    No data found.  Updated Vital Signs BP 127/71 (BP Location: Left Arm)   Pulse 71   Temp 98.6 F (37 C) (Oral)   Resp 16   LMP 04/28/2014   SpO2 95%   Visual Acuity Right Eye Distance:   Left Eye Distance:   Bilateral Distance:    Right Eye Near:   Left Eye Near:    Bilateral Near:     Physical Exam Vitals and nursing note reviewed.  Constitutional:      General: She is not in acute distress.    Appearance: Normal appearance. She is not ill-appearing, toxic-appearing or diaphoretic.  HENT:     Right Ear: Drainage (serous fluid, bubbles present) present.     Left Ear: Tympanic membrane, ear canal and external ear normal.      Nose: Congestion (moderately enlarged turbinates) present. No rhinorrhea.     Mouth/Throat:     Mouth: Mucous membranes are moist.     Pharynx: Oropharynx is clear. No oropharyngeal exudate or posterior oropharyngeal erythema.  Eyes:     General: No scleral icterus. Cardiovascular:     Rate and Rhythm: Normal rate and regular rhythm.     Heart sounds: Normal heart sounds.  Pulmonary:     Effort: Pulmonary effort is normal. No respiratory distress.     Breath sounds: Normal breath sounds. No wheezing or rhonchi.  Skin:    General: Skin is warm.  Neurological:     Mental Status: She is alert and oriented to person, place, and time.  Psychiatric:        Mood and Affect: Mood normal.        Behavior: Behavior normal.      UC Treatments / Results  Labs (all labs ordered are listed, but only abnormal results are displayed) Labs Reviewed  POC COVID19/FLU A&B COMBO - Normal    EKG   Radiology No results found.  Procedures Procedures (including critical care time)  Medications Ordered in UC Medications - No data to display  Initial Impression / Assessment and Plan / UC Course  I have reviewed the triage vital signs and the nursing notes.  Pertinent labs & imaging results that were available during my care of the patient were reviewed by me and considered in my medical decision making (see chart  for details).   Final Clinical Impressions(s) / UC Diagnoses   Final diagnoses:  Viral URI  Dysfunction of right eustachian tube     Discharge Instructions      Monitor BP more closely as sudafed can increase it.   You been diagnosed with a viral illness today. -Viruses have to run their course and medicines that are prescribed are meant to help with symptoms. - With viruses usually feel poorly from 3 to 7 days with cough being the last symptoms to resolve.  -Cough can linger from days to weeks.  Antibiotics are not effective for viruses. -If your cough lasts more than 2  weeks and you are coughing so hard that you are vomiting or feel like you could pass out we need to follow-up with PCP for further testing and evaluation. -Rest, increase water intake, may use pseudoephedrine  for nasal congestion, Delsym (dextromethorphan) or honey as needed for cough, and ibuprofen  and/or Tylenol  as directed on packaging for pain and fever. -If you have hypertension you should take Coricidin or other OTC meds approved for people with high blood pressure. -You may use a spoonful of honey every 4-6 hours as needed for throat pain and cough. -Warm tea with honey and lemon are helpful for soothe throat as well.  Chloraseptic and Cepacol make a throat lozenge with numbing medication, can be purchased over-the-counter. -May also use Flonase  or sinus rinse for sinus pressure or nasal congestion.  Be sure to use distilled bottled water for sinus rinses. -May use coolmist humidifier to open up nasal passages -May elevate head to assist with postnasal drainage. -If you feel poorly (fever, fatigue, shortness of breath, nausea, etc.) for more than 10 days to be sure to follow-up with PCP or in clinic for further evaluation and additional treatments. If you experience chest pain with shortness of breath or pulse oxygen less than 95% you should report to the ER.     ED Prescriptions     Medication Sig Dispense Auth. Provider   predniSONE  (DELTASONE ) 50 MG tablet Take 1 tab po daily as needed 5 tablet Andra Corean BROCKS, PA-C   fluticasone  (FLONASE ) 50 MCG/ACT nasal spray Place 1 spray into both nostrils daily. 16 g Demontez Novack C, PA-C   pseudoephedrine  (SUDAFED) 30 MG tablet Take 1 tablet (30 mg total) by mouth every 4 (four) hours as needed for congestion. 30 tablet Andra Corean BROCKS, PA-C      PDMP not reviewed this encounter.   Andra Corean BROCKS, PA-C 09/25/24 1710

## 2024-10-01 ENCOUNTER — Telehealth: Payer: Self-pay | Admitting: *Deleted

## 2024-10-01 NOTE — Telephone Encounter (Signed)
-----   Message from [facility] V sent at 06/04/2024 10:02 AM EDT ----- Regarding: labs CBC & Hepatic  in 09/2024  Appt with Deanna or McGreal

## 2024-10-01 NOTE — Telephone Encounter (Signed)
 Left message for patient to call office. Appointment needed with Dr. Suzann or Deann on 10/2024 and labs 1 week prior.

## 2024-10-07 NOTE — Telephone Encounter (Signed)
 Spoke with patient and she would like to hold off on scheduling until 11/2024. Patient states she will call back that she was in a meeting.

## 2024-10-10 ENCOUNTER — Encounter: Payer: Self-pay | Admitting: Family

## 2024-10-10 ENCOUNTER — Ambulatory Visit: Admitting: Family

## 2024-10-10 DIAGNOSIS — R6889 Other general symptoms and signs: Secondary | ICD-10-CM | POA: Diagnosis not present

## 2024-10-10 DIAGNOSIS — F900 Attention-deficit hyperactivity disorder, predominantly inattentive type: Secondary | ICD-10-CM | POA: Diagnosis not present

## 2024-10-10 DIAGNOSIS — I10 Essential (primary) hypertension: Secondary | ICD-10-CM

## 2024-10-10 DIAGNOSIS — E1169 Type 2 diabetes mellitus with other specified complication: Secondary | ICD-10-CM

## 2024-10-10 DIAGNOSIS — E538 Deficiency of other specified B group vitamins: Secondary | ICD-10-CM

## 2024-10-10 LAB — POC COVID19 BINAXNOW: SARS Coronavirus 2 Ag: NEGATIVE

## 2024-10-10 LAB — POCT INFLUENZA A/B
Influenza A, POC: NEGATIVE
Influenza B, POC: NEGATIVE

## 2024-10-10 MED ORDER — LISDEXAMFETAMINE DIMESYLATE 20 MG PO CAPS: 20.0000 mg | ORAL_CAPSULE | Freq: Every day | ORAL | 0 refills | Status: AC

## 2024-10-10 MED ORDER — LOSARTAN POTASSIUM 25 MG PO TABS: 25.0000 mg | ORAL_TABLET | Freq: Two times a day (BID) | ORAL | 1 refills | Status: AC

## 2024-10-10 NOTE — Progress Notes (Unsigned)
 Patient ID: Haley Wyatt, female    DOB: Mar 27, 1976, 48 y.o.   MRN: [id number]  Chief Complaint  Patient presents with   Attention deficit hyperactivity disorder (ADHD), predominan   Nasal Congestion    Pt c/o body aches, fatigue and runny nose, dayquil nyquil mucinex . Present for 2 days.    last vyvanse  refill 12/3  Subjective:    Outpatient Medications Prior to Visit  Medication Sig Dispense Refill   busPIRone  (BUSPAR ) 5 MG tablet Take 1-2 tablets (5-10 mg total) by mouth 2 (two) times daily as needed (Anxiety.). 60 tablet 2   Cyanocobalamin (PHYSICIANS EZ USE B-12) 1000 MCG/ML KIT Inject 1 each as directed once a month. 1 kit 11   fluticasone  (FLONASE ) 50 MCG/ACT nasal spray Place 1 spray into both nostrils daily. 16 g 0   Multiple Vitamins-Minerals (MULTI-VITE) LIQD Take 30 mLs by mouth daily.     predniSONE  (DELTASONE ) 50 MG tablet Take 1 tab po daily as needed 5 tablet 0   pseudoephedrine  (SUDAFED) 30 MG tablet Take 1 tablet (30 mg total) by mouth every 4 (four) hours as needed for congestion. 30 tablet 0   lisdexamfetamine  (VYVANSE ) 20 MG capsule Take 1 capsule (20 mg total) by mouth daily before breakfast. 30 capsule 0   lisdexamfetamine  (VYVANSE ) 20 MG capsule Take 1 capsule (20 mg total) by mouth daily before breakfast. 30 capsule 0   lisdexamfetamine  (VYVANSE ) 20 MG capsule Take 1 capsule (20 mg total) by mouth daily before breakfast. 30 capsule 0   lisdexamfetamine  (VYVANSE ) 30 MG capsule Take 1 capsule (30 mg total) by mouth daily before breakfast. 30 capsule 0   losartan  (COZAAR ) 25 MG tablet Take 1 tablet (25 mg total) by mouth 2 (two) times daily. 180 tablet 1   metFORMIN  (GLUCOPHAGE ) 1000 MG tablet TAKE 1 TABLET BY MOUTH TWICE DAILY WITH  MEAL 60 tablet 0   No facility-administered medications prior to visit.   Past Medical History:  Diagnosis Date   ADD (attention deficit disorder)    ADHD    Allergy    Back pain    muscle strain from  exercising   Back pain    Borderline diabetes    states levels have been about 102   Chest pain    Chronic headaches    Constipation    Corneal ulceration 06/23/2021   Diabetes mellitus without complication (HCC)    Dysmenorrhea 01/28/2015   Fibroid tumor    inside uterus   GERD 06/11/2010   Qualifier: Diagnosis of  By: Curtis MD, Debby     GERD (gastroesophageal reflux disease)    PT STATES DOES NOT HAVE IT NOW   H/O hiatal hernia    Heel pain, bilateral 06/23/2021   Hypertension    NO MEDS.. BP CONTROLLED   Hypothyroidism    HYPOTHYROIDISM 04/16/2009   Qualifier: Diagnosis of  By: Curtis MD, Thomas     Joint pain    LEG EDEMA, 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   Qualifier: Diagnosis of  By: Curtis MD, Thomas     OBESITY 04/16/2009   Other fatigue 08/18/2020   Other specified hypothyroidism 08/18/2020   Sedimentation rate elevation 06/23/2021   SOB (shortness of breath) on exertion    Swelling of both lower extremities    Urge incontinence 05/02/2022   Past Surgical History:  Procedure Laterality Date   ABDOMINAL HYSTERECTOMY     ABDOMINOPLASTY/PANNICULECTOMY WITH LIPOSUCTION  02/15/2023   surgery in [place]   BILATERAL SALPINGECTOMY Bilateral 01/28/2015  Procedure: BILATERAL SALPINGECTOMY;  Surgeon: Shanda SHAUNNA Muscat, MD;  Location: [facility];  Service: Gynecology;  Laterality: Bilateral;   CARPAL TUNNEL RELEASE Right    DILITATION & CURRETTAGE/HYSTROSCOPY WITH HYDROTHERMAL ABLATION N/A 04/16/2014   Procedure: DILATATION & CURETTAGE/HYSTEROSCOPY WITH HYDROTHERMAL ABLATION;  Surgeon: Aida DELENA Na, MD;  Location: [facility];  Service: Gynecology;  Laterality: N/A;   HIATAL HERNIA REPAIR N/A 02/11/2014   Procedure: LAPAROSCOPIC REPAIR OF HIATAL HERNIA;  Surgeon: Donnice KATHEE Lunger, MD;  Location: [facility];  Service: General;  Laterality: N/A;   LAPAROSCOPIC GASTRIC SLEEVE RESECTION N/A 02/11/2014   Procedure: LAPAROSCOPIC  GASTRIC SLEEVE RESECTION;  Surgeon: Donnice KATHEE Lunger, MD;  Location: [facility];  Service: General;  Laterality: N/A;   SHOULDER SURGERY     left shoulder loose body removal   SUPRACERVICAL ABDOMINAL HYSTERECTOMY Bilateral 01/28/2015   Procedure: TOTAL SUPER CERVICAL ABDOMINAL HYSTERECTOMY BILATERAL SALPINGECTOMY;  Surgeon: Shanda SHAUNNA Muscat, MD;  Location: [facility];  Service: Gynecology;  Laterality: Bilateral;   TUBAL LIGATION     UPPER GI ENDOSCOPY  02/11/2014   Procedure: UPPER GI ENDOSCOPY;  Surgeon: Donnice KATHEE Lunger, MD;  Location: [facility];  Service: General;;   Allergies[1]    Objective:    Physical Exam Vitals and nursing note reviewed.  Constitutional:      Appearance: Normal appearance. She is obese. She is ill-appearing.     Interventions: Face mask in place.  HENT:     Right Ear: Tympanic membrane and ear canal normal.     Left Ear: Tympanic membrane and ear canal normal.     Nose:     Right Sinus: No frontal sinus tenderness.     Left Sinus: No frontal sinus tenderness.     Mouth/Throat:     Mouth: Mucous membranes are moist.     Pharynx: Posterior oropharyngeal erythema present. No pharyngeal swelling, oropharyngeal exudate or uvula swelling.     Tonsils: No tonsillar exudate or tonsillar abscesses.  Cardiovascular:     Rate and Rhythm: Normal rate and regular rhythm.  Pulmonary:     Effort: Pulmonary effort is normal.     Breath sounds: Normal breath sounds.  Musculoskeletal:        General: Normal range of motion.  Lymphadenopathy:     Head:     Right side of head: No preauricular or posterior auricular adenopathy.     Left side of head: No preauricular or posterior auricular adenopathy.     Cervical: No cervical adenopathy.  Skin:    General: Skin is warm and dry.  Neurological:     Mental Status: She is alert.  Psychiatric:        Mood and Affect: Mood normal.        Behavior: Behavior normal.    BP (!) 107/54 (BP Location: Left Arm, Patient Position:  Sitting, Cuff Size: Large)   Pulse 83   Temp 98.1 F (36.7 C) (Temporal)   Ht 5' 7.75 (1.721 m)   Wt 245 lb 9.6 oz (111.4 kg)   LMP 04/28/2014   SpO2 93%   BMI 37.62 kg/m  Wt Readings from Last 3 Encounters:  10/10/24 245 lb 9.6 oz (111.4 kg)  06/04/24 249 lb 4 oz (113.1 kg)  04/25/24 257 lb 8 oz (116.8 kg)      Chriss Redel, NP       [1] No Known Allergies

## 2024-10-11 MED ORDER — PHYSICIANS EZ USE B-12 1000 MCG/ML IJ KIT: PACK | INTRAMUSCULAR | 11 refills | Status: AC

## 2024-10-20 ENCOUNTER — Other Ambulatory Visit: Payer: Self-pay | Admitting: Family
